# Patient Record
Sex: Female | Born: 1940
Health system: Southern US, Community
[De-identification: ages and names within clinical notes are randomized; demographics above are authoritative.]

## PROBLEM LIST (undated history)

## (undated) DIAGNOSIS — Z923 Personal history of irradiation: Secondary | ICD-10-CM

## (undated) DIAGNOSIS — E119 Type 2 diabetes mellitus without complications: Secondary | ICD-10-CM

## (undated) DIAGNOSIS — K219 Gastro-esophageal reflux disease without esophagitis: Secondary | ICD-10-CM

## (undated) DIAGNOSIS — E0590 Thyrotoxicosis, unspecified without thyrotoxic crisis or storm: Secondary | ICD-10-CM

## (undated) DIAGNOSIS — F419 Anxiety disorder, unspecified: Secondary | ICD-10-CM

## (undated) DIAGNOSIS — F32A Depression, unspecified: Secondary | ICD-10-CM

## (undated) DIAGNOSIS — E032 Hypothyroidism due to medicaments and other exogenous substances: Secondary | ICD-10-CM

## (undated) DIAGNOSIS — F329 Major depressive disorder, single episode, unspecified: Secondary | ICD-10-CM

## (undated) DIAGNOSIS — K5792 Diverticulitis of intestine, part unspecified, without perforation or abscess without bleeding: Secondary | ICD-10-CM

## (undated) DIAGNOSIS — K5790 Diverticulosis of intestine, part unspecified, without perforation or abscess without bleeding: Secondary | ICD-10-CM

## (undated) DIAGNOSIS — I10 Essential (primary) hypertension: Secondary | ICD-10-CM

## (undated) DIAGNOSIS — M1990 Unspecified osteoarthritis, unspecified site: Secondary | ICD-10-CM

## (undated) DIAGNOSIS — C801 Malignant (primary) neoplasm, unspecified: Secondary | ICD-10-CM

## (undated) HISTORY — PX: BREAST EXCISIONAL BIOPSY: SUR124

## (undated) HISTORY — DX: Major depressive disorder, single episode, unspecified: F32.9

## (undated) HISTORY — DX: Type 2 diabetes mellitus without complications: E11.9

## (undated) HISTORY — PX: BLADDER SURGERY: SHX569

## (undated) HISTORY — PX: ROTATOR CUFF REPAIR: SHX139

## (undated) HISTORY — DX: Unspecified osteoarthritis, unspecified site: M19.90

## (undated) HISTORY — DX: Essential (primary) hypertension: I10

## (undated) HISTORY — DX: Thyrotoxicosis, unspecified without thyrotoxic crisis or storm: E05.90

## (undated) HISTORY — DX: Depression, unspecified: F32.A

## (undated) HISTORY — DX: Diverticulitis of intestine, part unspecified, without perforation or abscess without bleeding: K57.92

---

## 1975-01-27 HISTORY — PX: ABDOMINAL HYSTERECTOMY: SHX81

## 1998-08-22 ENCOUNTER — Ambulatory Visit: Admission: RE | Admit: 1998-08-22 | Discharge: 1998-08-22 | Payer: Self-pay | Admitting: Urology

## 1998-08-26 ENCOUNTER — Ambulatory Visit (HOSPITAL_COMMUNITY): Admission: RE | Admit: 1998-08-26 | Discharge: 1998-08-26 | Payer: Self-pay | Admitting: Urology

## 1998-11-22 ENCOUNTER — Ambulatory Visit (HOSPITAL_COMMUNITY): Admission: RE | Admit: 1998-11-22 | Discharge: 1998-11-22 | Payer: Self-pay | Admitting: *Deleted

## 1999-03-06 ENCOUNTER — Encounter: Payer: Self-pay | Admitting: General Surgery

## 1999-03-06 ENCOUNTER — Ambulatory Visit (HOSPITAL_COMMUNITY): Admission: RE | Admit: 1999-03-06 | Discharge: 1999-03-06 | Payer: Self-pay | Admitting: General Surgery

## 1999-09-11 ENCOUNTER — Ambulatory Visit (HOSPITAL_COMMUNITY): Admission: RE | Admit: 1999-09-11 | Discharge: 1999-09-11 | Payer: Self-pay | Admitting: Urology

## 1999-09-11 ENCOUNTER — Encounter: Payer: Self-pay | Admitting: Urology

## 1999-09-12 ENCOUNTER — Ambulatory Visit (HOSPITAL_COMMUNITY): Admission: RE | Admit: 1999-09-12 | Discharge: 1999-09-12 | Payer: Self-pay | Admitting: Urology

## 2000-03-24 ENCOUNTER — Ambulatory Visit (HOSPITAL_COMMUNITY): Admission: RE | Admit: 2000-03-24 | Discharge: 2000-03-24 | Payer: Self-pay | Admitting: General Surgery

## 2000-03-24 ENCOUNTER — Encounter: Payer: Self-pay | Admitting: General Surgery

## 2002-05-22 ENCOUNTER — Encounter (HOSPITAL_COMMUNITY): Admission: RE | Admit: 2002-05-22 | Discharge: 2002-08-20 | Payer: Self-pay | Admitting: Internal Medicine

## 2002-05-23 ENCOUNTER — Encounter: Payer: Self-pay | Admitting: Internal Medicine

## 2002-06-06 ENCOUNTER — Ambulatory Visit (HOSPITAL_COMMUNITY): Admission: RE | Admit: 2002-06-06 | Discharge: 2002-06-06 | Payer: Self-pay | Admitting: General Surgery

## 2002-06-06 ENCOUNTER — Encounter: Payer: Self-pay | Admitting: General Surgery

## 2002-10-16 ENCOUNTER — Ambulatory Visit (HOSPITAL_BASED_OUTPATIENT_CLINIC_OR_DEPARTMENT_OTHER): Admission: RE | Admit: 2002-10-16 | Discharge: 2002-10-16 | Payer: Self-pay | Admitting: Plastic Surgery

## 2002-10-16 ENCOUNTER — Encounter (INDEPENDENT_AMBULATORY_CARE_PROVIDER_SITE_OTHER): Payer: Self-pay | Admitting: Specialist

## 2002-12-09 ENCOUNTER — Ambulatory Visit (HOSPITAL_COMMUNITY): Admission: RE | Admit: 2002-12-09 | Discharge: 2002-12-09 | Payer: Self-pay | Admitting: Orthopedic Surgery

## 2002-12-28 ENCOUNTER — Ambulatory Visit (HOSPITAL_COMMUNITY): Admission: RE | Admit: 2002-12-28 | Discharge: 2002-12-28 | Payer: Self-pay | Admitting: Orthopedic Surgery

## 2002-12-28 ENCOUNTER — Ambulatory Visit (HOSPITAL_BASED_OUTPATIENT_CLINIC_OR_DEPARTMENT_OTHER): Admission: RE | Admit: 2002-12-28 | Discharge: 2002-12-28 | Payer: Self-pay | Admitting: Orthopedic Surgery

## 2003-06-07 ENCOUNTER — Ambulatory Visit (HOSPITAL_COMMUNITY): Admission: RE | Admit: 2003-06-07 | Discharge: 2003-06-07 | Payer: Self-pay | Admitting: General Surgery

## 2004-06-12 ENCOUNTER — Ambulatory Visit (HOSPITAL_COMMUNITY): Admission: RE | Admit: 2004-06-12 | Discharge: 2004-06-12 | Payer: Self-pay | Admitting: General Surgery

## 2004-06-17 ENCOUNTER — Ambulatory Visit (HOSPITAL_COMMUNITY): Admission: RE | Admit: 2004-06-17 | Discharge: 2004-06-17 | Payer: Self-pay | Admitting: General Surgery

## 2004-12-12 ENCOUNTER — Ambulatory Visit (HOSPITAL_COMMUNITY): Admission: RE | Admit: 2004-12-12 | Discharge: 2004-12-12 | Payer: Self-pay | Admitting: Obstetrics and Gynecology

## 2004-12-31 ENCOUNTER — Ambulatory Visit (HOSPITAL_COMMUNITY): Admission: RE | Admit: 2004-12-31 | Discharge: 2004-12-31 | Payer: Self-pay | Admitting: Orthopaedic Surgery

## 2005-01-08 ENCOUNTER — Encounter: Admission: RE | Admit: 2005-01-08 | Discharge: 2005-01-08 | Payer: Self-pay | Admitting: Orthopaedic Surgery

## 2005-05-12 ENCOUNTER — Encounter (HOSPITAL_COMMUNITY): Admission: RE | Admit: 2005-05-12 | Discharge: 2005-08-10 | Payer: Self-pay | Admitting: Internal Medicine

## 2005-06-24 ENCOUNTER — Encounter: Admission: RE | Admit: 2005-06-24 | Discharge: 2005-06-24 | Payer: Self-pay | Admitting: Surgery

## 2005-06-24 ENCOUNTER — Encounter (INDEPENDENT_AMBULATORY_CARE_PROVIDER_SITE_OTHER): Payer: Self-pay | Admitting: *Deleted

## 2005-06-24 ENCOUNTER — Other Ambulatory Visit: Admission: RE | Admit: 2005-06-24 | Discharge: 2005-06-24 | Payer: Self-pay | Admitting: Interventional Radiology

## 2005-08-17 ENCOUNTER — Ambulatory Visit (HOSPITAL_COMMUNITY): Admission: RE | Admit: 2005-08-17 | Discharge: 2005-08-17 | Payer: Self-pay | Admitting: Orthopaedic Surgery

## 2005-11-30 ENCOUNTER — Ambulatory Visit (HOSPITAL_COMMUNITY): Admission: RE | Admit: 2005-11-30 | Discharge: 2005-11-30 | Payer: Self-pay | Admitting: General Surgery

## 2005-12-25 ENCOUNTER — Other Ambulatory Visit: Admission: RE | Admit: 2005-12-25 | Discharge: 2005-12-25 | Payer: Self-pay | Admitting: Obstetrics and Gynecology

## 2005-12-29 ENCOUNTER — Ambulatory Visit (HOSPITAL_COMMUNITY): Admission: RE | Admit: 2005-12-29 | Discharge: 2005-12-29 | Payer: Self-pay | Admitting: Internal Medicine

## 2006-01-28 ENCOUNTER — Encounter: Admission: RE | Admit: 2006-01-28 | Discharge: 2006-01-28 | Payer: Self-pay | Admitting: Orthopaedic Surgery

## 2006-02-14 ENCOUNTER — Emergency Department (HOSPITAL_COMMUNITY): Admission: EM | Admit: 2006-02-14 | Discharge: 2006-02-14 | Payer: Self-pay | Admitting: Emergency Medicine

## 2006-02-22 ENCOUNTER — Ambulatory Visit (HOSPITAL_COMMUNITY): Admission: RE | Admit: 2006-02-22 | Discharge: 2006-02-22 | Payer: Self-pay | Admitting: Urology

## 2006-07-12 ENCOUNTER — Ambulatory Visit (HOSPITAL_COMMUNITY): Admission: RE | Admit: 2006-07-12 | Discharge: 2006-07-12 | Payer: Self-pay | Admitting: Orthopaedic Surgery

## 2006-07-29 ENCOUNTER — Encounter: Admission: RE | Admit: 2006-07-29 | Discharge: 2006-07-29 | Payer: Self-pay | Admitting: Orthopaedic Surgery

## 2006-10-05 ENCOUNTER — Ambulatory Visit (HOSPITAL_COMMUNITY): Admission: RE | Admit: 2006-10-05 | Discharge: 2006-10-05 | Payer: Self-pay | Admitting: Obstetrics and Gynecology

## 2006-11-29 ENCOUNTER — Observation Stay (HOSPITAL_COMMUNITY): Admission: EM | Admit: 2006-11-29 | Discharge: 2006-11-30 | Payer: Self-pay | Admitting: Emergency Medicine

## 2007-01-21 ENCOUNTER — Ambulatory Visit (HOSPITAL_COMMUNITY): Admission: RE | Admit: 2007-01-21 | Discharge: 2007-01-21 | Payer: Self-pay | Admitting: Pediatrics

## 2007-01-27 HISTORY — PX: JOINT REPLACEMENT: SHX530

## 2007-03-25 ENCOUNTER — Ambulatory Visit (HOSPITAL_BASED_OUTPATIENT_CLINIC_OR_DEPARTMENT_OTHER): Admission: RE | Admit: 2007-03-25 | Discharge: 2007-03-25 | Payer: Self-pay | Admitting: Orthopaedic Surgery

## 2007-04-03 ENCOUNTER — Ambulatory Visit: Payer: Self-pay | Admitting: Internal Medicine

## 2007-04-03 ENCOUNTER — Inpatient Hospital Stay (HOSPITAL_COMMUNITY): Admission: EM | Admit: 2007-04-03 | Discharge: 2007-04-05 | Payer: Self-pay | Admitting: Emergency Medicine

## 2007-04-12 ENCOUNTER — Inpatient Hospital Stay (HOSPITAL_COMMUNITY): Admission: EM | Admit: 2007-04-12 | Discharge: 2007-04-15 | Payer: Self-pay | Admitting: Emergency Medicine

## 2007-04-14 ENCOUNTER — Encounter (INDEPENDENT_AMBULATORY_CARE_PROVIDER_SITE_OTHER): Payer: Self-pay | Admitting: Gastroenterology

## 2007-05-02 ENCOUNTER — Ambulatory Visit (HOSPITAL_COMMUNITY): Admission: RE | Admit: 2007-05-02 | Discharge: 2007-05-02 | Payer: Self-pay | Admitting: Gastroenterology

## 2007-10-21 ENCOUNTER — Inpatient Hospital Stay (HOSPITAL_COMMUNITY): Admission: RE | Admit: 2007-10-21 | Discharge: 2007-10-25 | Payer: Self-pay | Admitting: Orthopaedic Surgery

## 2007-11-09 ENCOUNTER — Encounter: Admission: RE | Admit: 2007-11-09 | Discharge: 2008-01-16 | Payer: Self-pay | Admitting: Orthopaedic Surgery

## 2008-03-16 ENCOUNTER — Emergency Department (HOSPITAL_COMMUNITY): Admission: EM | Admit: 2008-03-16 | Discharge: 2008-03-16 | Payer: Self-pay | Admitting: Emergency Medicine

## 2008-05-24 ENCOUNTER — Ambulatory Visit (HOSPITAL_COMMUNITY): Admission: RE | Admit: 2008-05-24 | Discharge: 2008-05-24 | Payer: Self-pay | Admitting: Obstetrics and Gynecology

## 2009-11-04 ENCOUNTER — Ambulatory Visit (HOSPITAL_COMMUNITY): Admission: RE | Admit: 2009-11-04 | Discharge: 2009-11-04 | Payer: Self-pay | Admitting: Orthopaedic Surgery

## 2010-02-04 ENCOUNTER — Encounter
Admission: RE | Admit: 2010-02-04 | Discharge: 2010-02-04 | Payer: Self-pay | Source: Home / Self Care | Attending: General Surgery | Admitting: General Surgery

## 2010-02-16 ENCOUNTER — Encounter: Payer: Self-pay | Admitting: Orthopaedic Surgery

## 2010-06-10 NOTE — Consult Note (Signed)
[name], [name]               ACCOUNT NO.:  [id number]   MEDICAL RECORD NO.:  [id number]          PATIENT TYPE:  INP   LOCATION:  [place]                         FACILITY:  [facility]   PHYSICIAN:  Nelwyn Salisbury, M.D.  DATE OF BIRTH:  1940-10-22   DATE OF CONSULTATION:  04/13/2007  DATE OF DISCHARGE:                                 CONSULTATION   REASON FOR CONSULTATION:  Diarrhea with rectal bleeding x 2 weeks.   ASSESSMENT:  1. Diarrhea with bloody stools 2 weeks ago and left-sided colitis      noted on a CT. Rule out ischemic colitis versus infectious colitis.  2. History of constipation and predominant irritable bowel syndrome.  3. History of hypothyroidism and thyroid nodules followed by Dr.      Cristine Polio in [facility].  4. Hypertension on Lotensin and Norvasc.  5. History of depression on Lexapro.  6. History of migraine headaches on oxycodone.  7. History of nephrolithiasis.  8. Status post partial hysterectomy with bladder tack and      abdominoplasty several years ago.  9. History of left rotator cuff surgery.  10.History of right knee arthroscopy on March 25, 2007 followed by      treatment with Keflex after surgery.  11.History of degenerative joint disease.   RECOMMENDATIONS:  1. Colonoscopy is planned for tomorrow morning.  2. The patient has been advised to avoid all nonsteroidals.  3. Avoid all artificial sweeteners like Splenda, Equal and diet      drinks.  4. Probiotics to be to added to treatment regimen.  5. Check TSH levels.   DISCUSSION:  [name] is a very pleasant 70 year old white  female who had arthroscopic knee surgery on the right side done on  March 25, 2007 by Dr. Zollie Beckers. She was prescribed Keflex after  surgery and was doing well until April 02, 2007 when she developed  abdominal cramping and diarrhea.  Subsequently she developed rectal  bleeding and was hospitalized on April 03, 2007 at [facility]  where  she was admitted by the teaching service.  She was found to have  leukocytosis with abdominal pain and a CT scan was done which showed  left-sided colitis.  The patient was treated empirically with Cipro and  Flagyl and her symptoms improved, the white count normalized and her  rectal bleeding resolved. Diarrhea improved and the patient was  discharged on April 05, 2007. Her symptoms returned shortly after  discharge. She went to her PCP, Dr. Reita Cliche, who recommended  that she will be rehospitalized. The patient was subsequently  hospitalized to [facility] on April 12, 2007 for similar  problems and GI consultation has been requested.  The patient was seen  by Dr. Amedeo Plenty at [facility] but the patient has chosen to switch  gastroenterologist for reasons not clear to me. She however denies any  bloody stools at this time.  She has 6-7 and sometimes 8 loose bowel  movements per day.  These seem to occur every time she eats. She has  diffuse abdominal pain  with no nausea or vomiting.  Appetite has been  fairly good. Her weight has been stable.  There is no history of melena.  She has a family history of colon cancer in the maternal grandmother.  She denies a family history of gluten allergy or IBD.   PAST MEDICAL HISTORY:  See list above.   ALLERGIES:  MORPHINE.   MEDICATIONS AT HOME:  1. Lexapro 40 mg daily.  2. Estratest 1 mg daily.  3. Lotrel 5/20 mg daily.  4. Ambien 10 mg at bedtime.  5. Percocet 5/325 one to two tablets every 4 hours p.r.n. pain.   [place]ote:  Norvasc, Lotensin, Lexapro,  methyltestosterone with estrogen and Dilaudid p.r.n., oxycodone p.r.n.,  Compazine, Ambien.   SOCIAL HISTORY:  She lives in [place] with her husband. She has three  grown children and six grandchildren.  She works as a Passenger transport manager at  [employer]. Her daughter is a Midwife at [employer]. She  denies the use of alcohol, tobacco or  drugs.   FAMILY HISTORY:  Her maternal grandmother had colon cancer but died of  complications of dementia. Her father has had heart disease.  Her  parents are otherwise alive and well.  There is no family history IBD or  gluten allergies.   PHYSICAL EXAMINATION:  GENERAL:  Reveals a very pleasant and cooperative  older white female in no acute distress lying comfortably in bed.  The patient has a temperature of 98.7, blood pressure of 113/63, pulse  94 per minute, respiratory rate 20.  HEENT:  Examination reveals atraumatic, normocephalic head.  The patient  had healthy oropharyngeal mucosa without exudate.  NECK:  Supple.  CHEST:  Clear to auscultation. S1, S2 regular. No murmur, rub or gallop,  rales, rhonchi or wheezing.  ABDOMEN:  Soft, slightly obese with diffuse tenderness on palpation with  normal abdominal bowel sounds.  No hepatosplenomegaly appreciated.  RECTAL:  Deferred.  EXTREMITIES:  Warm without edema, cyanosis or clubbing.  There is no  neurological deficits.   LABORATORY DATA:  A normal CBC done today. PT was normal at 15.1 with a  PTT of 32 and INR of 1.2. BMET revealed a sodium level of 140, potassium  4.3, chloride 109, CO2 28, glucose was 113, BUN 4, creatinine 0.8,  calcium 8.1, lipase was normal at 53 done yesterday.  The patient had a  stool for Cryptosporidium and Giardia that was negative on April 05, 2007. C diff toxin assay has been negative on March 9 done twice and  again on March 10. LFTs were normal on the CMET done on April 12, 2007.  She had a CT scan of the abdomen and pelvis  on April 03, 2007 that revealed left-sided colitis and the differential  diagnosis was pseudomembranous colitis versus ischemic colitis.  She had  bilateral nephrolithiasis.   PLAN:  As above. Other recommendation will be made once the colonoscopy  has been done.      Nelwyn Salisbury, M.D.  Electronically Signed     JNM/MEDQ  D:  04/13/2007  T:  04/14/2007  Job:   [id number]   cc:   Cristine Polio, MD  [facility], [place]  Fax: [phone]

## 2010-06-10 NOTE — Discharge Summary (Signed)
[name], [name]               ACCOUNT NO.:  [id number]   MEDICAL RECORD NO.:  [id number]          PATIENT TYPE:  INP   LOCATION:  6743                         FACILITY:  [facility]   PHYSICIAN:  Thomes Lolling, M.D.    DATE OF BIRTH:  [birth year]   DATE OF ADMISSION:  04/03/2007  DATE OF DISCHARGE:  04/05/2007                               DISCHARGE SUMMARY   DISCHARGE DIAGNOSES:  1. Colitis with hematochezia.  2. Hypertension.  3. Depression.  4. Status post right knee arthroscopy.   MEDICATIONS AT DISCHARGE:  1. Citalopram 40 mg p.o. daily.  2. Estratest 1 tablet p.o. daily.  3. Lotrel 5/20 mg p.o. daily.  4. Percocet 5/325 mg 1 to 2 tablets p.o. every 4 hours p.r.n.  5. Ambien 10 mg p.o. nightly p.r.n.  6. Cipro 500 mg p.o. b.i.d.  7. Flagyl 500 mg p.o. t.i.d.   DISPOSITION AND FOLLOWUP:  The patient is to follow up with primary care  at [facility], Dr. Reita Cliche.  During the visit, the patient is  to be assessed for abdominal pain/left lower quadrant tenderness.  Also,  inquiry as to stools and hematochezia.  A CBC can be done in order to  assess hemoglobin.  The patient's hemoglobin on admission was 14.9, and  hemoglobin on discharge was 13.9.  The patient is also to follow up with  Dr. Alonza Bogus, gastroenterologist.  The patient states she had a  colonoscopy greater than 10 years ago, therefore a repeat colonoscopy  for screening purposes, and in this case, given the colitis noted on CT  and the hematochezia, a colonoscopy is warranted to rule out ischemic  bowel versus other etiologies.   PROCEDURES PERFORMED:  CT of the abdomen done on April 03, 2007.   IMPRESSION:  Colitis of the left colon from splenic flexure down to  sigmoid colon.   DEFERENTIAL DIAGNOSES:  Includes ischemic colitis, C. diff, and  infectious colitis.   CONSULTATION:  Dr. Amedeo Plenty from Gastroenterology was consulted.   BRIEF ADMITTING HISTORY AND PHYSICAL:  The patient is a 70 year old  woman with past medical history of hypertension and nephrolithiasis that  comes in with left lower quadrant pain and bright red blood per rectum  that began at 3:00 p.m. the day prior to admission.  She reports that  she went to the bathroom and began having cramping left upper quadrant  abdominal pain and blood in stools.  Of note, the stools were formed and  brown.  As she continued to have bowel movements, they became less  formed, more watery, and increased blood with blood eventually turning  cranberry in color.  The patient states she has had greater than 10  bowel movements since symptoms began the afternoon prior to admission.  The patient states she is currently only passing small amounts of blood  with no stools whatsoever.  This was confirmed with her last bowel  movement in the emergency department.  The patient states that her pain  that was initially in the left upper quadrant has now became left lower  quadrant in nature, it has been  constant since it started.  No  palliating or provoking factors noted.  The pain is 3/10 in intensity  now, it was 8/10 at its worst.  The pain does not radiate and the  patient has not had this pain before.  The pain was associated with  nausea and chills.  The patient states she recently finished a Z-Pak for  some nasal congestion on March 29, 2007, and is also on a  methylprednisolone taper status post her right knee arthroscopy on  March 25, 2007.  The patient is taking aspirin 81 mg p.o. daily.  As  mentioned above, she has not had a colonoscopy in greater than 10 years  ago, the last one was presumed normal.  The patient denies any  dizziness, loss of consciousness, vomiting, fever, weight loss, recent  travel, changes in diet, or anticoagulants.  The patient also denies any  claudication, exertional chest pain, or any abdominal pain after eating.   PHYSICAL EXAMINATION:  VITAL SIGNS:  On admission, temperature 98.9,  blood pressure  167/75, pulse 68, respirations 24, and O2 sat 98% on room  air.  GENERAL:  The patient was uncomfortable, but in no acute distress.  HEENT:  Eyes were anicteric.  Pupils equally round and reactive to  light.  Extraocular movements intact.  ENT with pink moist mucous  membranes, and oropharynx is clear.  NECK:  Supple.  RESPIRATION:  Clear to auscultation bilaterally with good air movement.  CARDIOVASCULAR:  She had a regular rate and rhythm.  No murmurs,  gallops, or rubs with positive S1 and S2.  GI:  Hyperactive bowel sounds noted, soft with tenderness to palpation  in the left upper quadrant, but she had tenderness to palpation in the  left lower quadrant greater than left upper quadrant, and initially  questionable rebound that was not reproducible afterwards, though quite  tender and nondistended.  EXTREMITIES:  Without any edema and good peripheral pulses.  GU:  No CVA tenderness.  RECTAL:  Positive for external hemorrhoids.  Anal tone was within normal  limits.  Positive for internal hemorrhoids.  No stool in the rectum, but  small amount of bright red blood that was obviously hemoccult positive.  MUSCULOSKELETAL:  Status post right knee arthroscopy.  No erythema,  swelling, or tenderness noted.  NEUROLOGIC:  The patient was alert and oriented x3.  Cranial nerves II  through XII grossly intact, motor intact, sensory intact, and gait was  normal.  PSYCHIATRY:  The patient's affect was appropriate.   LABORATORY:  Sodium 137, potassium 3.6, chloride 103, bicarb 24, BUN 14,  creatinine 0.87, glucose 127, and bilirubin 1.0.  Alk phos 57, AST 17,  and ALT 21.  Protein 6.6, albumin 3.8, and calcium 9.2.  WBCs 20.9, ANC  16.9, hemoglobin 14.9, MCV 19.4, platelets 322, lipase 32, PTT 26, PT  13.0, point-of-care markers negative x1, and lactic acid 2.0.   HOSPITAL COURSE BY PROBLEM:  1. Colitis with hematochezia.  Given the patient's leukocytosis with a      left shift, left lower  quadrant pain, and colitis per CT scan,      concern for infectious colitis.  Therefore, the patient was started      on IV ciprofloxacin and Flagyl.  The patient was also put on 1 mg      Dilaudid given reported migraine headaches from morphine.  Given      the patient also had a recent course of antibiotics as an  outpatient as well as some intraoperative antibiotics, concern for      C. diff was also present, and the patient was given Flagyl per      above as well as C. diff toxin assays.  The patient had no stools      formed initially, but once the stool began forming, a C. diff was      sent out along with stool culture and had 2 negative C. diffs.  The      other concern was for bowel ischemia, the patient does not report      any other signs of ischemia anywhere including coronary artery      disease or angina, exertional chest pain, claudication, or      abdominal pain after eating.  However, the patient was put on a      clear liquid diet, advanced to full liquids, and told to continue      for a few more days as an outpatient and then advance as tolerated.      The patient's pain improved substantially while in the hospital and      tolerated her full liquid diet.  The patient is to follow up with      an outpatient colonoscopy with Dr. Alonza Bogus.  Other concern      which is thought to be less likely is ulcerative colitis given the      patient's age and bimodal distribution of ulcerative colitis in the      late teens to early 60s and then again in the late 64s or 26s.      Again, this is thought to be less likely, but the patient was      recommended outpatient colonoscopy.  The patient's white count      decreased substantially from 20.9 down to 16 and discharged with a      white count of 13.9.  The patient was afebrile throughout the      hospitalization, and as mentioned before, had a substantial      decrease in pain and was tolerating full liquids.  2.  Hypertension.  The patient's blood pressure was initially slightly      elevated, but was continued on her home dose of Lotrel.  3. Depression.  The patient denied any suicidal ideation and appeared      to be stable.  The patient's citalopram at home dose was continued.  4. Status post right knee arthroscopy.  Again, as mentioned in the      HPI, the patient did not have any signs of infection including      tenderness to palpation, erythema, or swelling.  The patient was      continued on her methylprednisolone taper which was completed on      the day of discharge.  The patient was put on Dilaudid per #1,      which also controlled the knee pain.   LABORATORY ON DISCHARGE:  WBCs of 13.9, initially 20.9; hemoglobin 13.9  initially, 14.9 on admission.  C. diff negative x2, stool culture is  pending above.  The  patient is being treated.      Alphia Moh, MD  Electronically Signed      Thomes Lolling, M.D.  Electronically Signed    MA/MEDQ  D:  04/05/2007  T:  04/06/2007  Job:  [id number]   cc:   Hallam Amedeo Plenty, M.D.  Wynetta Emery Hurrelbrink

## 2010-06-10 NOTE — Op Note (Signed)
NAMECARLINE, [name]               ACCOUNT NO.:  [id number]   MEDICAL RECORD NO.:  [id number]          PATIENT TYPE:  AMB   LOCATION:  [place]                          FACILITY:  [facility]   PHYSICIAN:  Lind Guest. Ninfa Linden, M.D.DATE OF BIRTH:  03-24-1940   DATE OF PROCEDURE:  03/25/2007  DATE OF DISCHARGE:                               OPERATIVE REPORT   PREOPERATIVE DIAGNOSIS:  1. Severe degenerative arthritis medial compartment, right knee.  2. Chronic meniscal tearing with mechanical symptoms, right knee.   POSTOPERATIVE DIAGNOSIS:  1. 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  2. Complex medial meniscal tear, right knee.   PROCEDURE:  Right knee arthroscopy with debridement including  chondroplasty of the medial compartment and a partial medial  meniscectomy.   SURGEON:  Lind Guest. Ninfa Linden, M.D.   ANESTHESIA:  1. Local knee block, right knee.  2. IV sedation with mask ventilation.   ESTIMATED BLOOD LOSS:  Minimal.   COMPLICATIONS:  None.   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  She is well  known to me.  I have seen her for several months now with worsening  right knee pain.  She has had an effusion above the knee.  I have tried  anti-inflammatory medications as well as injections of the knee and even  an MRI which showed severe medial compartment degenerative joint  disease.  She was maintaining herself well with the pain, but then she  started to develop locking and catching in her knee.  I talked to her  about the possibility of knee replacement surgery, but she wished to see  if there was anything she could do just to stop the mechanical symptoms.  The risks and benefits of the surgery were explained to her in length  and she agreed to proceed with surgery.   PROCEDURE DESCRIPTION:  After informed consent was obtained and the  appropriate right knee was marked, anesthesia was obtained with a knee  block, she was then brought to the operating room and placed supine on  the operating table.  Mask ventilation and IV sedation was obtained.  A  non-sterile tourniquet was placed around her upper right thigh but was  never utilized during the case.  Her knee was prepped and draped with  DuraPrep and sterile drapes including a sterile stockinette.  With the  bed raised and the lateral leg post utilized, the knee was flexed off  the side of the table, a time out was called, and she was identified as  the correct patient and correct right knee.   I then made an anterolateral portal just lateral to the patellar tendon  and an arthroscopic cannula was inserted.  There was a large effusion  that was drained from the knee.  I then put the camera in the knee and  went directly to the medial compartment.  You could see that there was  significant grade 4 chondromalacia of the medial femoral condyle and  medial plateau and there was complex tearing of the central portion of  the meniscus all the way back to the posterior horn.  An anteromedial  portal was then made an arthroscopic shaver was inserted.  I performed a  partial medial meniscectomy using the shaver and upcoming biters.  I  then used the shaver to debride loose areas of cartilage along the  medial femoral condyle and the medial plateau as well as the trochlea  where there was significant chondromalacia, as well.  The lateral  compartment was assessed and found to be intact and pristine.  The ACL  was, likewise, intact.  There was degenerative changes underneath the  patella and the arthroscopic shaver was used to debride the trochlea and  then the patella.  I then drained the effusion from the knee and removed  all instrumentation.  The portal sites were closed with interrupted 4-0  nylon sutures.  Xeroform followed by a well padded sterile dressing was  applied.   [name] was then taken to the recovery room in stable condition.   Postoperatively, she will increase her activities as she tolerates.  All  final counts were correct and there were no complications noted.      Lind Guest. Ninfa Linden, M.D.  Electronically Signed     CYB/MEDQ  D:  03/25/2007  T:  03/26/2007  Job:  [id number]

## 2010-06-10 NOTE — Op Note (Signed)
[name], Hernandez               ACCOUNT NO.:  [id number]   MEDICAL RECORD NO.:  [id number]          PATIENT TYPE:  INP   LOCATION:  [place]                         FACILITY:  [facility]   PHYSICIAN:  Lind Guest. Ninfa Linden, M.D.DATE OF BIRTH:  11-03-1940   DATE OF PROCEDURE:  10/21/2007  DATE OF DISCHARGE:                               OPERATIVE REPORT   PREOPERATIVE DIAGNOSES:  Severe degenerative joint disease and  osteoarthritis of the right knee.   POSTOPERATIVE DIAGNOSES:  Severe degenerative joint disease and  osteoarthritis of the right knee.   PROCEDURE:  Right total knee arthroplasty using computer-assisted  navigation.   IMPLANTS:  DePuy rotating platform knee with size 2 femur, size 2 tibial  tray, 10-mm polyethylene insert, 32-mm polyethylene button.   SURGEON:  Lind Guest. Ninfa Linden, M.D.   ASSISTANT:  Epimenio Foot, P.A.   ANESTHESIA:  1. Right leg femoral block.  2. General.   TOURNIQUET TIME:  1 hour and 30 minutes.   BLOOD LOSS:  300 mL.   ANTIBIOTICS:  Ancef 1 gram IV.   COMPLICATIONS:  None.   INDICATIONS:  Briefly, [name] is a 70 year old female who is also an OR  attendant and a scrub tech at [employer].  She has had debilitating pain  with her right knee, and I have seen her for some time for this.  I have  even performed arthroscopic surgery on her knee to temporize this, due  to the fact that she wanted to miss as little work as possible.  At the  time of arthroscopy I found that she had severe cartilage loss  throughout her knee.  We have still tried to temporize her symptoms with  injections, and it has gotten to the point it was greatly affecting her  activities of daily living.  A recommendation for total knee replacement  was made and she agrees to proceed with surgery.  The risks and benefits  of surgery included the risk of blood loss as well the risk of a DVT and  PE.  She agreed to proceed with surgery after these risks were  understood.   PROCEDURE DESCRIPTION:  After informed consent was obtained, the  appropriate right leg was marked.  Anesthesia was obtained with a  femoral nerve block.  She was then brought to the operating room and  placed supine on the operating table.  General anesthesia was then  obtained.  A Foley catheter was placed.  A nonsterile tourniquet was  placed around her right operative thigh, and then the right leg was  prepped and draped with DuraPrep and sterile drapes including a sterile  stockinette.  A time-out was called and she was identified as the  correct patient and the correct right knee.  I then used the Esmarch to  wrap out the leg and the tourniquet was inflated to 325 mmHg.  A midline  incision was then made directly over the patella, and was carried  proximally and distally.  I dissected down to the joint.  I then made a  medial parapatellar arthrotomy.  There was a large amount of  synovial  fluid that was encountered.  Once I was able to open the knee joint, I  could easily invert the patella and flex the knee.  I cleaned the knee  of osteophytes throughout the femoral condyle and the tibial plateau.  We removed the remaining portions of the medial and lateral meniscus for  the ACL and PCL.  Next, I proceeded with the computer navigation portion  of the case.  Two Steinmann pins were made through 2 small stab  incisions in the tibia distally, and were placed in the anteromedial to  posterolateral direction.  Through the major incision, I placed 2 pins  also in the femur.  Navigation arrays were then placed on the femoral  pins and the tibial pins for navigation portion of the case.  I was able  to select out points around the knee and ankle, as well as assessing the  hip center of rotation for mapping out the knee using computer  navigation.  This helped [place] decide on what the appropriate cuts to make  for balancing the knee as much as possible.  This also allowed for  soft  tissue balancing.  With the knee flexed, we first made a tibial cut and  took about 10 mm off the high side.  This was verified under computer  navigation.  Once this was made, we made our distal femoral cut; and  with the spacer blocks, we verified that we had balance cuts in flexion  and extension.  At the distal femoral cut we were able to make the  anterior and posterior femoral cuts as well as the chamfer cuts.  The  computer helped [place] and with visualization we made our cuts based off a  size 2 femur.  A femoral block box cut was then also made.  Then we  sized the size 2 tibial tray and made the appropriate cuts for the  tibial keel.  The size 2 tibial tray was followed by a size 2 femur and  10-mm polyethylene insert were placed as the trial components in the  knee.  I then made a measurement off the patella and did a freehand cut  of the patella, and placed a size 32 patellar button.  We drilled the  button holes in the patella.  I put the knee through a range of motion  and verified that was fully balanced in flexion and extension, using  computer navigation as well as direct visualization -- and it was felt  to be a stable knee.   I then removed all trial components and we fully irrigated the knee with  pulsatile lavage solution.  We then used cement and cemented in the real  tibial tray -- which was a size 2 DePuy rotating platform knee/tibia.  We cemented the size 2 femoral component, placed the real 10-mm  polyethylene insert, and then cemented the 32-mm patellar button.  Once  the cement was allowed to dry, we put the knee through a range of motion  again; and it was felt to be stable ligamentously and well balanced.  We  then let tourniquet down and hemostasis was obtained.  We thoroughly  irrigated out the knee again with pulsatile lavage, and then  reapproximated the arthrotomy with #1 Vicryl suture in interrupted  format.  We then closed the deeper tissue with 0  Vicryl, followed by 2-0  Vicryl, followed by a running 4-0 Vicryl subcuticular stitch.  I did  place a medium Hemovac through  the end of the arthrotomy before closing  this.  Steri-Strips were then placed, followed by a well-padded sterile  dressing.  The Steinmann pins were removed and the distal Steinmann pin  sites were just closed with interrupted 3-0 nylon suture.  A well-padded  sterile dressing was applied.  The patient was awakened, extubated and  taken to recovery room in stable condition.  There were no complications  noted and all final counts were correct.      Lind Guest. Ninfa Linden, M.D.  Electronically Signed     CYB/MEDQ  D:  10/21/2007  T:  10/22/2007  Job:  [id number]

## 2010-06-10 NOTE — Discharge Summary (Signed)
[name], [name]               ACCOUNT NO.:  [id number]   MEDICAL RECORD NO.:  [id number]          PATIENT TYPE:  INP   LOCATION:  [place]                         FACILITY:  [facility]   PHYSICIAN:  Lind Guest. Ninfa Linden, M.D.DATE OF BIRTH:  Oct 18, 1940   DATE OF ADMISSION:  10/21/2007  DATE OF DISCHARGE:  10/25/2007                               DISCHARGE SUMMARY   ADMITTING DIAGNOSIS:  Severe osteoarthritis and degenerative joint  disease right knee.   DISCHARGE DIAGNOSIS:  Severe degenerative disease and osteoarthritis  right knee.   PROCEDURE:  Right total knee arthroplasty on October 21, 2007.   DISCHARGE MEDICATIONS:  1. Oxycodone 5 mg 1-2 p.o. q. 4-6 hours p.r.n.  2. Robaxin 500 mg one p.o. q.6 h p.r.n.  3. Coumadin 5 mg p.o. daily at 6:00 p.m., adjust for target INR of 2.0-      3.0.  4. See checked off medications on medical reconciliation order.   DISPOSITION:  To home.   DISCHARGE INSTRUCTIONS:  While she is at home, [name] will work with  [place] with range of motion on her right knee as well as  a gait training and mobility.  She can get her incision wet in shower  and dry dressings can be changed as needed.  Follow-up appointment will  be established in the office in 2 weeks.  Home health therapy will also  perform biweekly lab draws for adjustment of her Coumadin.   HOSPITAL COURSE:  Briefly, [name] is a 70 year old female with  severe degenerative joint disease involving her right knee.  We have  tried to temporize this with the arthroscopic surgeries well as  injections and anti-inflammatories.  It had gotten to the point where  this was affecting her activities of daily living, and she was to  proceed with a total knee replacement.  We counseled her extensively  about this and she understood the need to proceed with total knee  surgery.   [name] was taken to the operating room.  On the day of admission,  she reports she underwent  successful right total knee arthroplasty  without complications.  For detailed description of the operation,  please refer to the dictated operative note in the patient's medical  record.  Postoperatively, she was admitted to the orthopedic floor and  began working with her knee on a CPM for continuous passive motion.  She  also began working with physical therapy and occupational therapy on  activities daily living as well as a train mobility.  By the day of  discharge, she was  tolerating her oral diet as well as oral pain medications.  Her knee  incision was clean, dry and intact.  Her INR was being adjusted  accordingly.  She was doing quite well.  It was felt she could be  discharged safely to home with close home health PT.      Lind Guest. Ninfa Linden, M.D.  Electronically Signed     CYB/MEDQ  D:  10/25/2007  T:  10/25/2007  Job:  [id number]

## 2010-06-10 NOTE — Consult Note (Signed)
NAMESOMA, GENTLE NO.:  [id number]   MEDICAL RECORD NO.:  [id number]          PATIENT TYPE:  OBV   LOCATION:  [id number]                         FACILITY:  [facility]   PHYSICIAN:  Belva Crome, M.D.   DATE OF BIRTH:  11-20-1940   DATE OF CONSULTATION:  11/30/2006  DATE OF DISCHARGE:  11/30/2006                                 CONSULTATION   CONCLUSIONS:  1. Prolonged chest discomfort occurring at rest and of tight quality,      etiology uncertain; myocardial infarction has been excluded.  Rule      out coronary artery disease.  2. History of depression.  3. Hypertension.  4. History of migraine headaches.  5. Degenerative arthritis.  6. History of hypothyroidism and goiter.   RECOMMENDATIONS:  1. Agree with management to this point; myocardial infarction has been      excluded.  2. Pharmacologic stress perfusion study to rule out obstructive      coronary disease.  3. Risk factor modification as the patient has a moderately increased      Framingham risk score in the 10% range over the next 10 years.   COMMENT:  The patient is 54 and was admitted on November 29, 2006, with  chest discomfort.  She had a 30 to 40-minute episode of substernal  pressure that was low grade.  It was not aggravated by physical activity  and not precipitated by activity of motion.  The discomfort resolved  spontaneously.  She had had previous episodes of similar quality.  She  was admitted to the hospital, and also markers to this point have been  negative.   Risk factors for coronary disease include family history (father had  bypass surgery), hypertension, and postmenopausal state.   PHYSICAL EXAMINATION:  GENERAL:  The patient is in no acute distress.  VITAL SIGNS:  Blood pressure is 134/80 and heart rate is 84.  HEENT:  Unremarkable.  Pupils are equal and reactive to light.  NECK:  No JVD or carotid bruits.  LUNGS:  Clear to auscultation and percussion.  CARDIAC :  No gallop.   No rub.  No click.  ABDOMEN:  Soft.  Bowel sounds normal.  EXTREMITIES:  No edema.  Pulses 2+ and symmetric in upper and lower  extremities.   EKG reveals QS pattern in V1 and V2.  Normal sinus rhythm, otherwise  unremarkable.   DISCUSSION:  The patient will undergo a pharmacologic myocardial  perfusion study to rule out obstructive coronary disease.  Myocardial  infarction has been excluded.  The likelihood of significant coronary  disease is low-to-moderate.  Risk factor modification should be started.      Belva Crome, M.D.  Electronically Signed     HWS/MEDQ  D:  11/30/2006  T:  12/01/2006  Job:  [id number]   cc:   Reita Cliche

## 2010-06-10 NOTE — H&P (Signed)
[name], [name]               ACCOUNT NO.:  [id number]   MEDICAL RECORD NO.:  [id number]          PATIENT TYPE:  INP   LOCATION:  [place]                         FACILITY:  [facility]   PHYSICIAN:  Silvestre Moment, MDDATE OF BIRTH:  [birth year]   DATE OF ADMISSION:  04/12/2007  DATE OF DISCHARGE:                              HISTORY & PHYSICAL   CHIEF COMPLAINT:  Persistent diarrhea secondary to colitis.   HISTORY OF PRESENT ILLNESS:  [name] is a 70 year old woman who was  discharged from the [facility] on April 05, 2007 after  a 3-day admission for a workup of colitis.  According to the discharge  summary, she initially presented on April 03, 2007 with left lower  quadrant pain and bright red blood per rectum.  She had a CT scan that  showed left-sided colitis.  Given her white count at that time of  approximately 20, there is concern for an infectious component.  She was  started on IV Cipro and Flagyl.  Her pain was controlled with Dilaudid.  Clostridium difficile cultures and cultures for Giardia were eventually  negative.  There was also some concern for ischemic colitis.  She was  evaluated in consultation by Dr. Amedeo Plenty from gastroenterology.  By April 05, 2007, her diarrhea and resolved, her white count had normalized, and  she was planned for an outpatient colonoscopy with Dr. Alonza Bogus.  She  was also to complete a course of p.o. Flagyl and Cipro.  She completed  this course this morning.  Unfortunately, since her discharge  approximately 1 week ago, she has had persistence of her diarrhea.  She  describes the diarrhea as watery with small flecks of solid material.  These episodes occur every time she eats.  She had approximately five  today.  She denies blood in her stools at this point.  She continues to  have her crampy 5/10 abdominal pain.  It is slightly improved with  Percocet.  She has no fever.  Her weight has remained stable.  However,  she  does have decreased energy.  She denies dizziness or  lightheadedness, and she denies recent travel or sick contacts.   REVIEW OF SYSTEMS:  A comprehensive 10-point review of systems was  obtained and was negative except as per HPI.   PAST MEDICAL HISTORY:  1. Colitis, as above.  2. Hypertension.  3. Migraine headaches.  4. Degenerative arthritis, status post right knee arthroscopy,      March 25, 2007.  5. Hypothyroidism.  6. Depression.  7. Kidney stones.  8. Rotator cuff surgery.  9. Status post hysterectomy.  10.Possible IBS in the past, with diarrheal-predominant disease.   SOCIAL HISTORY:  She lives in [place] with her husband.  She works as  a Equities trader at [employer].  Her daughter is a Midwife here at  [employer].  She denies tobacco, alcohol, or illicit drug use.   FAMILY HISTORY:  Grandmother died of colon cancer.  Father has coronary  artery disease.  No family history of gastrointestinal disorders.   ALLERGIES:  1.  MORPHINE.  2. DEMEROL.   MEDICATIONS:  1. Lexapro 40 mg daily.  2. Estratest 1 mg daily.  3. Lotrel 5/20 mg daily.  4. Ambien 10 mg at night.  5. Percocet 5/325, 1-2 tablets q.4 h. if needed.   PHYSICAL EXAMINATION:  VITAL SIGNS:  Temperature 98.9, blood pressure  134/57, pulse 91, respiratory rate 18, pulse oximetry 97% on room air.  GENERAL:  Well-nourished well-developed woman, in no apparent discomfort  or acute distress.  HEENT:  Sclerae icteric.  Oropharynx clear.  NECK:  Supple.  HEART:  Regular, with no murmurs.  LUNGS:  Clear to auscultation bilaterally.  ABDOMEN:  Soft, nondistended.  Positive bowel sounds in all four  quadrants.  She has mild diffuse tenderness, with slight increase in the  left lower quadrant.  There is mild voluntary guarding.  No significant  rebound tenderness.  EXTREMITIES: Warm, with no edema.  NEUROLOGIC:  No focal deficits.   DATA REVIEW:  1. White count 9.8, hemoglobin 14.3, platelets  283.  2. Potassium 3.4, otherwise comprehensive metabolic panel was within      normal limits.  Lipase within normal limits.  Urinalysis is a dirty      specimen.   ASSESSMENT AND PLAN:  1. [name] is a 70 year old woman with a known history of left-      sided colitis from her recent admission at [facility].      Unfortunately, she has had persistence of her diarrheal symptoms.      This has caused her to be symptomatic with fatigue.  She has      recently completed a course of ciprofloxacin and Flagyl.  She has      no fever or leukocytosis to support an infectious etiology.  There      was some concern during the last admission for an ischemic cause.      Fortunately, her anion gap is normal at this time, and there is no      appearance of ongoing ischemia.  There is also a report of blood      with her previous diarrhea.  She has no bleeding at this time, and      her hemoglobin has actually increased from her discharge level.      Therefore, the etiology at this time is somewhat uncertain.  We      will admit her to the hospitalist service.  We will make her n.p.o.      after midnight.  We will consult gastroenterology.  There was some      mention in their previous consult note about an unprepped      sigmoidoscopy.  Otherwise, we told her we would hold off on any      prep for colonoscopy until they have make this decision.  We will      hydrate her with normal saline.  We will not utilize Imodium at      this point, but would consider if the diarrhea becomes severe      overnight.  We will use Compazine as needed for nausea.  We will      check labs in the morning to make sure they are stable.  We will      use Dilaudid as needed.  We have asked her to use this judiciously,      as we would not like to mask any increase in her pain.  2. Hypertension:  [name]' blood pressure is within normal limits  at this time.  We will continue her Lotrel.  3. Depression.  Ms.  Hernandez' mood appears to be stable at this time.      We will continue her Lexapro.  4. Hypokalemia.  [name]' potassium was slightly low at 3.4 on      admission.  We will give her 60 mEq of potassium now.  We will      recheck her values in the morning.  5. Deep venous thrombosis prophylaxis.  Given Ms. Atalla' reported      history of blood per rectum, we will not utilize pharmacologic DVT      prophylaxis.  We will have her use SCDs, as she will be immobile      and has other risk factor of being greater than 21 years of age.      Silvestre Moment, MD  Electronically Signed     WT/MEDQ  D:  04/12/2007  T:  04/13/2007  Job:  [id number]

## 2010-06-10 NOTE — Consult Note (Signed)
NAMEVERDELLA, STOEN               ACCOUNT NO.:  [id number]   MEDICAL RECORD NO.:  [id number]          PATIENT TYPE:  INP   LOCATION:  6743                         FACILITY:  [place]   PHYSICIAN:  John C. Amedeo Plenty, M.D.    DATE OF BIRTH:  February 03, 1940   DATE OF CONSULTATION:  04/03/2007  DATE OF DISCHARGE:                                 CONSULTATION   REASON FOR CONSULTATION:  Acute colitis presumed ischemic.   HISTORY OF PRESENT ILLNESS:  The patient is a 70 year old white female  who began having lower abdominal cramps, becoming more intense with  diarrhea which later became bloody during the night with pain settling  in the left lower quadrant.  She presented here and was found to white  blood cell count of 20,000, although she has been methylprednisolone in  light of some recent knee surgery.  She has abdominal CT scan which  showed left-sided colitis involving the splenic flexure descending and  sigmoid colon.  She had been on some antibiotics briefly and around the  time for arthroscopic knee surgery.  She denies any recent travel.  She  has no history of inflammatory bowel disease.   PAST MEDICAL HISTORY:  1. Hypertension.  2. Migraine headaches.  3. Degenerative arthritis.  4. Hypothyroidism.  5. Depression.  6. Kidney stones.   SURGERY:  None.   ALLERGIES:  MORPHINE, DEMEROL.   MEDICATIONS:  Citalopram, Solu-Medrol, Estratest, Lotrel, tramadol,  Ambien, aspirin 81 mg.   FAMILY HISTORY:  Grandmother died of colon cancer.  Father had coronary  artery disease.   PHYSICAL EXAMINATION:  GENERAL:  Well-developed, well-nourished white  female in no acute distress.  HEART:  Regular rate and rhythm without murmurs.  LUNGS:  Clear.  ABDOMEN:  Soft, nondistended with hypoactive bowel sounds.  No  hepatosplenomegaly, mass or guarding.  There is left-sided tenderness  with mild focal rebound particularly in the left lower quadrant.   IMPRESSION:  Probable ischemic colitis.   PLAN:  Antibiotics, bowel rest and will need colonoscopy at some point  if improves and diagnosis is secure.  Will probably allow complete  recovery.  If she does not improve, may do at least unprepped flexible  sigmoidoscopy during this hospitalization.           ______________________________  Elyse Jarvis Amedeo Plenty, M.D.     JCH/MEDQ  D:  04/03/2007  T:  04/04/2007  Job:  [id number]

## 2010-06-10 NOTE — Discharge Summary (Signed)
[name], [name]               ACCOUNT NO.:  [id number]   MEDICAL RECORD NO.:  [id number]          PATIENT TYPE:  INP   LOCATION:  [place]                         FACILITY:  [facility]   PHYSICIAN:  Sherryl Manges, M.D.  DATE OF BIRTH:  August 17, 1940   DATE OF ADMISSION:  04/12/2007  DATE OF DISCHARGE:  04/15/2007                               DISCHARGE SUMMARY   PRIMARY MEDICAL DOCTOR:  Althia Forts.   DISCHARGE DIAGNOSES:  1. Acute colitis, likely microscopic.  2. Hypertension.  3. History of depression.  4. Status post right knee arthroscopy March 25, 2007.  5. Bilateral asymptomatic nephrolithiasis.  6. History of migraines.   DISCHARGE MEDICATIONS:  1. Lexapro 40 mg p.o. daily.  2. Estratest 1 tablet p.o. daily.  3. Lotrel (5/20) one p.o. daily.  4. Ambien 10 mg p.o. p.r.n. daily at bedtime.  5. Percocet (5/325) one p.o. p.r.n. q.4 h.  6. Align (4 mg) or Florastor (250 mg) one p.o. p.r.n. 1-2x daily over-      the-counter.   Note:  Flagyl, Ciprofloxacin and NSAIDs, i.e., Advil, ibuprofen, Aleve  OTC, have all been discontinued.   PROCEDURES:  Colonoscopy done April 14, 2007, by Dr. Juanita Craver,  gastroenterologist.  This showed patchy loss of vascular markings noted  throughout the colon, multiple biopsies were done to rule out  microscopic/lymphocytic colitis.  There was a normal terminal ileum, a  few early sigmoid diverticula, no masses or polyps seen.   CONSULTATIONS:  Dr. Juanita Craver, gastroenterologist.   ADMISSION HISTORY:  As in H and P notes of April 12, 2007, dictated by  Dr. Dominica Severin.  However, in brief, this is a 70 year old  female, with known history of hypertension, migraine headaches,  degenerative arthritis status post right knee arthroscopy March 25, 2007, hypothyroidism, depression, bilateral asymptomatic  nephrolithiasis, status post previous rotator cuff surgery, status post  hysterectomy, possible irritable bowel syndrome, who presents  with  persistent diarrheal illness.  She had reportedly, been discharged from  the [facility] on April 05, 2007, following a 3 day admission  for workup of colitis.  Abdominal CT scan at that time demonstrated left-  sided colitis.  She was evaluated by gastroenterologist, outpatient  colonoscopy planned, and had been discharged on an oral course of Flagyl  and Ciprofloxacin.  As of April 12, 2007, she had completed a 10 day  course of this treatment and was still symptomatic with approximately 5  diarrheal stools per day.  She was admitted for further evaluation,  investigation and management.   CLINICAL COURSE:  1. Acute colitis.  For details of presentation, refer to admission      history above.  The patient was managed with bowel rest,      intravenous fluid hydration and antibiotics.  GI consultation was      kindly provided by Dr. Juanita Craver, who performed a colonoscopy on      April 14, 2007.  For findings, refer to procedure list above.      Findings appeared consistent with possible microscopic or      lymphocytic colitis.  Random multiple biopsies were taken.  As of      the date of this dictation, pathology results were still pending.      The patient responded, however, to above mentioned management      measures.  By April 14, 2007, she was asymptomatic and was able to      commence regular diet.  She has been recommended to avoid NSAIDs,      artificial sweeteners, and continue with probiotics.  By April 15, 2007, she had no recurrence of symptoms and was keen to be      discharged and following okay by gastroenterologist, she was      discharged accordingly.   1. Hypertension.  This was adequately managed on the patient's pre-      admission antihypertensive medications.   1. History of migraines.  This did not prove problematic during the      course of the patient's hospitalization.   1. History of depression.  The patient's mood remained stable       throughout the course of her hospitalization, on pre-admission      dosage of Lexapro.   DISPOSITION:  The patient was on April 15, 2007, considered sufficiently  clinically stable and asymptomatic to be discharged. She was therefore,  discharged accordingly.   DIET:  Heart-healthy low residue diet until indicated otherwise by  gastroenterologist.   ACTIVITY:  As tolerated.   FOLLOWUP INSTRUCTION:  The patient has been scheduled a followup  appointment with Dr. Juanita Craver in 1 week.  She has been supplied  appropriate information.      Sherryl Manges, M.D.  Electronically Signed     CO/MEDQ  D:  04/15/2007  T:  04/15/2007  Job:  [id number]   cc:   Nelwyn Salisbury, M.D.  Fax: [phone]

## 2010-06-10 NOTE — Op Note (Signed)
[name], [name]               ACCOUNT NO.:  [id number]   MEDICAL RECORD NO.:  [id number]          PATIENT TYPE:  INP   LOCATION:  [place]                         FACILITY:  [facility]   PHYSICIAN:  Nelwyn Salisbury, M.D.  DATE OF BIRTH:  02-17-1940   DATE OF PROCEDURE:  04/14/2007  DATE OF DISCHARGE:                               OPERATIVE REPORT   PROCEDURE PERFORMED:  Colonoscopy with multiple cold biopsies.   ENDOSCOPIST:  Nelwyn Salisbury, M.D.   INSTRUMENT USED:  Pentax video colonoscope.   INDICATION FOR PROCEDURE:  A 70 year old white female with a history of  diarrhea since the end of February 2009 after she had antibiotics for  postoperative coverage after arthroscopic knee surgery.  The patient has  had several loose bowel movements per day.  She has had significant  rectal bleeding as well.  Rule out colonic polyps, masses, etc.  There  is a family history of colon cancer in the maternal grandmother.   PREPROCEDURE PREPARATION:  Informed consent was procured from the  patient.  The patient had fasted for 8 hours prior to the procedure  after being prepped with a bottle of magnesium citrate and a gallon of  NuLYTELY the night prior to the procedure.  The risks and benefits of  the procedure, including a 10% miss rate of cancer and polyps, were  discussed with the patient as well.   PREPROCEDURE PHYSICAL:  VITAL SIGNS:  The patient had stable vital  signs.  NECK:  Supple.  CHEST:  Clear to auscultation.  S1, S2 regular.  ABDOMEN:  Soft with normal bowel sounds.   DESCRIPTION OF PROCEDURE:  The patient was placed in the left lateral  decubitus position and sedated with 100 mcg of Fentanyl and 10 mg of  Versed given intravenously in slow incremental doses. Once the patient  was adequately sedate and maintained on low-flow oxygen and continuous  cardiac monitoring, the Pentax video colonoscope was advanced from the  rectum to the cecum.  There was some residual stool in the  colon.  Multiple washes were done.  A few early sigmoid diverticula were noted.  Patchy loss of vascular markings was also identified.  Random biopsies  were done to rule out microscopic colitis.  The appendiceal orifice and  ileocecal valve were visualized and photographed.  The terminal ileum  appeared healthy and without lesion.  Retroflexion in the rectum  revealed no abnormalities.  The patient tolerated the procedure well  without immediate complications.   IMPRESSION:  1. Patchy loss of vascular markings noted throughout the colon,      multiple biopsies done to rule out microscopic/lymphocytic colitis.  2. Normal terminal ileum.  3. Few early sigmoid diverticula.  4. No masses or polyps seen.   RECOMMENDATIONS:  1. Lomotil one p.o. q.6-8 h. p.r.n. for diarrhea.  2. Resume a high-fiber diet.  3. Check TSH and TTG levels.  4. Await pathology results.  5. Avoid artificial sweeteners like Splenda and Equal.  6. Outpatient follow-up after discharge.      Nelwyn Salisbury, M.D.  Electronically Signed  JNM/MEDQ  D:  04/15/2007  T:  04/15/2007  Job:  [id number]   cc:   Reita Cliche  Fax: [phone]   Bobette Mo, MD  [place], [place]

## 2010-06-13 NOTE — Op Note (Signed)
NAME:  [name], [name]                         ACCOUNT NO.:  [id number]   MEDICAL RECORD NO.:  [id number]                   PATIENT TYPE:  AMB   LOCATION:  [place]                                  FACILITY:  [facility]   PHYSICIAN:  Ninetta Lights, M.D.              DATE OF BIRTH:  [birth year]   DATE OF PROCEDURE:  12/28/2002  DATE OF DISCHARGE:                                 OPERATIVE REPORT   PREOPERATIVE DIAGNOSES:  Impingement, left shoulder with torn rotator cuff  and degenerative joint disease, acromioclavicular joint.   POSTOPERATIVE DIAGNOSES:  Impingement, left shoulder with torn rotator cuff  and degenerative joint disease, acromioclavicular joint with labrum tear as  well.   OPERATION PERFORMED:  Left shoulder examination under anesthesia,  arthroscopy, debridement of rotator cuff and labrum.  Acromioplasty with CA  ligament release.  Excision of distal clavicle.  Open repair rotator cuff  tear with FiberWire suture and Concept repair system.   SURGEON:  Ninetta Lights, M.D.   ASSISTANT:  Aaron Edelman D. Petrarca, P.A.-C.   ANESTHESIA:  General.   ESTIMATED BLOOD LOSS:  Minimal.   SPECIMENS:  None.   CULTURES:  None.   COMPLICATIONS:  None.   DRESSING:  Soft compressive with shoulder immobilizer.   DESCRIPTION OF PROCEDURE:  The patient was brought to the operating room and  placed on operating table in supine position.  After adequate anesthesia had  been obtained, placed in a beach chair position on the shoulder positioner,  prepped and draped in the usual sterile fashion.  Shoulder examined with  full motion, good stability.  Three standard arthroscopic portals, anterior,  posterior lateral.  Shoulder entered with blunt obturator, distended and  inspected.  Attritional tearing of the anterior labrum debrided.  Full  thickness tear of the supraspinatus tendon from just on top of the biceps  tendon almost all of the way back to the posterior attachment of the  supraspinatus.  Intra-articular flaps and frayed portions debrided.  Although retracted, mild to moderate, it was still very reparable.  Cannula  redirected subacromially.  Biceps tendon and biceps anchor were intact and  the tendon itself was not subluxed.  Subacromial view typical impingement.  Type 3 acromion.  Bursa resected, cuff debrided and acromioplasty to a type  1 acromion with shaver and high speed bur releasing CA ligament with  cautery.  Distal clavicle grade 4 changes.  Lateral centimeter resected.  Adequacy of decompression and clavicle excision confirmed viewing from all  portals.  Instruments and fluid removed.  Deltoid splitting incision to the  lateral portal exposing subacromial space.  The cuff was debrided back to  healthy tissue.  Well captured with two FiberWire sutures that were weaved  in a Bunnell fashion into the cuff.  They were then brought to the  attachment site on the tuberosity which had been brought down to roughened  bleeding bone with a bur.  Cuff was firmly repaired back to the humerus  through drill holes with Concept repair system.  The front of the cable was  anchored right at the biceps so that the biceps would remain anchored.  Sutures were brought through bony tunnels and tied over a bony bridge  yielding a nice firm watertight closure of the cuff without undue tension.  Full passive motion on completion.  Adequacy of decompression confirmed  digitally at time of cuff repair.  Wound irrigated.  Deltoid closed with  Vicryl.  Skin and subcutaneous with Vicryl.  Portals closed with 4-0 nylon.  Margins of wounds injected with Marcaine.  Sterile compressive dressing with  shoulder immobilizer applied.  Anesthesia reversed.  Brought to recovery  room.  Tolerated surgery well without complication.                                               Ninetta Lights, M.D.    DFM/MEDQ  D:  12/28/2002  T:  12/29/2002  Job:  [id number]

## 2010-06-13 NOTE — Op Note (Signed)
NAME:  [name], [name]                         ACCOUNT NO.:  [id number]   MEDICAL RECORD NO.:  [id number]                   PATIENT TYPE:  AMB   LOCATION:  [place]                                  FACILITY:  [facility]   PHYSICIAN:  Crissie Reese, M.D.                  DATE OF BIRTH:  Feb 13, 1940   DATE OF PROCEDURE:  10/16/2002  DATE OF DISCHARGE:                                 OPERATIVE REPORT   PREOPERATIVE DIAGNOSES:  1. Lesion of undetermined behavior of face, 0.5 cm.  2. Multiple lesions of undetermined behavior, trunk, total of six.  3. Subcutaneous mass of undetermined behavior, irritated, enlarging, right     posterior leg.   POSTOPERATIVE DIAGNOSES:  1. Lesion of undetermined behavior of face, 0.5 cm.  2. Multiple lesions of undetermined behavior of trunk, total of six.  3. Subcutaneous mass of undetermined behavior, irrigated, enlarging, right     posterior leg.  4. Wounds of the trunk and leg, total length greater than 4.0 cm.  5. Wound of face greater than 1.0 cm.   PROCEDURES:  1. Excision of lesion of face, undetermined behavior, 0.5 cm.  2. Excision of multiple lesions of trunk, total of six, 0.5 cm.  3. Excision of subcutaneous mass of the posterior calf, greater than 1.0 cm.  4. Intermediate wound closure of face, greater than 1.0.  5. Intermediate closures of the trunk, greater than 4.0 cm.   SURGEON:  Crissie Reese, M.D.   ANESTHESIA:  1% Xylocaine with epinephrine plus bicarbonate.   CLINICAL NOTE:  The patient is a 70 year old woman with multiple lesions.  Some of these have enlarged, they have changed or they have been irritated  and an excision is medically necessary.  In addition to this, she has a  subcutaneous lesion of her posterior right calf that has enlarged and been  inflamed at times.  She would like to have all of these excised.  The  procedures and the risks were understood by her and she wished to proceed.   DESCRIPTION OF PROCEDURE:  The patient  was placed first supine.  She was  prepped with Betadine and draped with sterile drapes.  Satisfactory local  anesthesia was achieved and the lesions were excised.  Layered closures with  5-0 Monocryl with interrupted deep sutures and 5-0 Monocryl running  subcuticular and for the face 6-0 Prolene simple interrupted sutures.  Antibiotic ointment and dry sterile dressings were applied to all sites.  The patient was then placed prone and again she was prepped with Betadine  and draped with sterile drapes.  Satisfactory local anesthesia was achieved.  An elliptical excision was performed excising the lesion in its entirety.  The wound was irrigated thoroughly and excellent hemostasis having been  confirmed layer closure with 4-0 Monocryl  interrupted deep sutures and 4-0 Monocryl running subcuticular suture.  Antibiotic ointment and a dry sterile dressing were applied.  The patient  tolerated this well.   DISPOSITION:  We will see her back next week in the office.                                               Crissie Reese, M.D.    DB/MEDQ  D:  10/16/2002  T:  10/16/2002  Job:  [id number]

## 2010-06-13 NOTE — Op Note (Signed)
[facility]  Patient:    [name], [name]                      MRN: [id number] Proc. Date: 09/12/99 Adm. Date:  [id number] Disc. Date: [id number] Attending:  Veverly Fells                           Operative Report  PREOPERATIVE DIAGNOSIS:  Right lower ureteral stone.  POSTOPERATIVE DIAGNOSIS:  Right lower ureteral stone.  OPERATION PERFORMED:  Cystoscopy, right retrograde pyelogram, extraction of stone.  SURGEON:  Sigmund I. Gaynelle Arabian, M.D.  ANESTHESIA:  General.  DESCRIPTION OF PROCEDURE:  After preanesthesia, the patient is brought to the operating room and placed on the operating room in the dorsosupine position where general anesthesia was introduced.  She was then replaced in the low Allen stirrups dorsolithotomy position where the pubis was prepped with Betadine solution and draped in the usual fashion.  Cystourethroscopy revealed that the right lower ureteral stone was in the bladder at the level of the right ureteral orifice.  The stone was removed with stone graspers.  A retrograde pyelogram showed no other stone material and Xylocaine was placed in the ureter.  Xylocaine jelly was placed in the bladder.  The patient was given IV Toradol, awakened and taken to the recovery room in good condition. DD:  09/12/99 TD:  09/14/99 Job: [id number]

## 2010-07-18 ENCOUNTER — Encounter (INDEPENDENT_AMBULATORY_CARE_PROVIDER_SITE_OTHER): Payer: Self-pay | Admitting: General Surgery

## 2010-07-29 ENCOUNTER — Ambulatory Visit (INDEPENDENT_AMBULATORY_CARE_PROVIDER_SITE_OTHER): Payer: Medicare Other | Admitting: General Surgery

## 2010-07-29 ENCOUNTER — Encounter (INDEPENDENT_AMBULATORY_CARE_PROVIDER_SITE_OTHER): Payer: Self-pay | Admitting: General Surgery

## 2010-07-29 VITALS — Temp 98.7°F

## 2010-07-29 DIAGNOSIS — K5732 Diverticulitis of large intestine without perforation or abscess without bleeding: Secondary | ICD-10-CM

## 2010-07-29 NOTE — Progress Notes (Signed)
Subjective:     [name], female   DOB: 1941-01-23, 70 y.o.   MRN: [id number]    Temp(Src) 98.7 F (37.1 C) (Oral)    HPI  Patient is a 70 year old female with diverticulitis I have been seeing for a while. She has just recently had another episode. She took a 10 day course of antibiotics and had resolution of her symptoms of cramping and change in her stools. She denies any fevers or chills throughout the episode. She has been off antibiotics for 2-3 days and has not had any recurrence of her symptoms. She has been tolerating a regular diet. She has been able to work.  Review of Systems  Constitutional: Negative.   HENT: Negative.   Eyes: Negative.   Respiratory: Negative.   Cardiovascular: Negative.   Gastrointestinal: Negative.   Genitourinary: Negative.   Neurological: Negative.   Hematological: Negative.   Psychiatric/Behavioral: Negative.        Objective:   Physical Exam  Constitutional: She is oriented to person, place, and time. She appears well-developed and well-nourished. No distress.  HENT:  Head: Normocephalic and atraumatic.  Mouth/Throat: No oropharyngeal exudate.  Eyes: Conjunctivae are normal. Pupils are equal, round, and reactive to light. No scleral icterus.  Pulmonary/Chest: Effort normal. No respiratory distress.  Abdominal: Soft. She exhibits no distension and no mass. There is no tenderness. There is no rebound and no guarding.  Musculoskeletal: Normal range of motion.  Neurological: She is alert and oriented to person, place, and time. Coordination normal.  Skin: Skin is warm and dry. No rash noted. She is not diaphoretic. No erythema.  Psychiatric: She has a normal mood and affect. Her behavior is normal. Judgment and thought content normal.       Assessment:        Plan:

## 2010-07-29 NOTE — Assessment & Plan Note (Signed)
She is now doing well.  I have advised her to call early if she develops recurrent symptoms.  All her episodes of diverticulitis have been mild.  She does not need surgery at this time.

## 2010-09-22 ENCOUNTER — Other Ambulatory Visit (INDEPENDENT_AMBULATORY_CARE_PROVIDER_SITE_OTHER): Payer: Self-pay

## 2010-09-22 DIAGNOSIS — K5792 Diverticulitis of intestine, part unspecified, without perforation or abscess without bleeding: Secondary | ICD-10-CM

## 2010-09-22 MED ORDER — CIPROFLOXACIN HCL 500 MG PO TABS: 500.0000 mg | ORAL_TABLET | Freq: Two times a day (BID) | ORAL | Status: AC

## 2010-09-22 MED ORDER — METRONIDAZOLE 500 MG PO TABS: 500.0000 mg | ORAL_TABLET | Freq: Two times a day (BID) | ORAL | Status: AC

## 2010-09-24 ENCOUNTER — Telehealth (INDEPENDENT_AMBULATORY_CARE_PROVIDER_SITE_OTHER): Payer: Self-pay | Admitting: General Surgery

## 2010-09-24 NOTE — Telephone Encounter (Signed)
PT CALLED REQUESTING ORDER FOR PHENERGAN DUE TO NAUSEA CAUSED BY 2 ANTIBIOTICS PRESCRIBED BY DR. BYERLY. DR. Georgette Dover GAVE OK TO CALL PHENERGAN 25MG  PO #30 TO [facility]  [phone]/ PT AWARE.

## 2010-09-26 ENCOUNTER — Other Ambulatory Visit (HOSPITAL_COMMUNITY): Payer: Medicare Other

## 2010-10-01 NOTE — H&P (Signed)
[name], HAKES NO.:  [id number]  MEDICAL RECORD NO.:  [id number]  LOCATION:                               FACILITY:  [facility]  PHYSICIAN:  Gaynelle Arabian, M.D.    DATE OF BIRTH:  1941-01-15  DATE OF ADMISSION:  12/17/2010 DATE OF DISCHARGE:                             HISTORY & PHYSICAL   CHIEF COMPLAINT:  Painful right total knee.  HISTORY OF PRESENT ILLNESS:  The patient is a 70 year old female who has been seen by Dr. Wynelle Link in a second opinion earlier this year in April. She is an IT consultant at [employer] and has been seen by Dr. Wynelle Link for a right knee.  She had a right total knee arthroplasty done by Dr. Lind Guest. Blackman back in 2009.  She initially did well and did not have any problems following the initial surgery.  No problems with wound, drainage, or infection.  She did well for the first few months, but then started having knee problems with lot of popping.  She is unable to go up and down steps, occasionally it feels warmth to her.  She was seen by Dr. Wynelle Link for a second opinion. She is found to have a kind of a patella clunk issue, but ongoing pain and dysfunction.  She tried using a brace which helped with instability. Thus, confirming part of her issue is some instability with the knee. Consideration a possible scope to remove the synovial-type patellar clunk lesion versus full revision to address the instability, the patient has elected to proceed with having the full procedure done. Risks and benefits had been discussed.  She elected to proceed with surgery.  No contraindications to the surgery such as ongoing infection or progressive neurological disease.  ALLERGIES: 1. MORPHINE causes migraines. 2. DEMEROL causes a rash.  CURRENT MEDICATIONS:  Citalopram, benazepril, amlodipine, estradiol.  PAST MEDICAL HISTORY: 1. Depression. 2. Hypertension. 3. Hypercholesterolemia. 4. Diverticulosis. 5.  Renal calculi. 6. Osteoporosis. 7. Postmenopausal.  PAST SURGICAL HISTORY:  Hysterectomy, right total knee replacement, rotator cuff repair, right bunion surgery, and bladder suspension procedure.  FAMILY HISTORY:  Father with heart disease and skin cancer.  Mother with Alzheimer's.  SOCIAL HISTORY:  Married, retired, nonsmoker.  No alcohol.  Three children.  She does have a caregiver lined up.  She has three steps entering her home.  REVIEW OF SYSTEMS:  GENERAL:  No fevers, chills, or night sweats. NEURO:  No seizures, syncope, or paralysis.  RESPIRATORY:  No shortness breath, productive cough, or hemoptysis.  CARDIOVASCULAR:  No chest pain or orthopnea.  GI:  Some intermittent constipation or diarrhea.  No nausea or vomiting.  GU:  No dysuria, hematuria, or discharge. MUSCULOSKELETAL:  Knee pain.  PHYSICAL EXAMINATION:  VITAL SIGNS:  Pulse 92, respirations 16, and blood pressure 149/82. GENERAL:  84-year white female, well nourished, well developed, in no acute distress.  She is alert, oriented, cooperative, pleasant, excellent historian. HEENT:  Normocephalic, atraumatic.  Pupils are round and reactive.  EOMs intact.  Noted wearing glasses and contacts at times. NECK:  Supple.  No carotid bruits. CHEST:  Clear, anterior posterior chest walls.  No rhonchi, rales,  or wheezing. HEART:  Regular rate and rhythm.  No murmur, S1, S2 noted. ABDOMEN:  Soft, nontender.  Bowel sounds present. RECTAL, BREASTS, AND GENITALIA:  Not done, not pertinent to present illness. EXTREMITIES:  Right knee previous incision is well healed.  Range of motion 5-115.  She does have some varus valgus play with some AP laxity at 90 degrees of flexion.  She has some crepitus noted with a patella clunk sound on exam.  IMPRESSION:  Painful right total knee arthroplasty.  PLAN:  The patient admitted to [facility] to undergo revision of right total knee surgery and will be performed by Dr. Gaynelle Arabian. Her plan is to go home following her hospital stay.     Alexzandrew L. Dara Lords, P.A.C.   ______________________________ Gaynelle Arabian, M.D.    ALP/MEDQ  D:  09/29/2010  T:  09/29/2010  Job:  [id number]  cc:   [facility]  Electronically Signed by Mickel Crow P.A.C. on 10/01/2010 10:01:25 AM Electronically Signed by Gaynelle Arabian M.D. on 10/01/2010 10:05:16 AM

## 2010-10-17 LAB — BASIC METABOLIC PANEL
CO2: 26
Chloride: 101
GFR calc Af Amer: >60
Glucose, Bld: 118 — ABNORMAL HIGH
Potassium: 4.2
Sodium: 136

## 2010-10-20 LAB — CBC
HCT: 40.4
HCT: 40.4
HCT: 43.5
HCT: 37.3
HCT: 39.1
HCT: 40.2
HCT: 41.6
Hemoglobin: 13.9
Hemoglobin: 13.9
Hemoglobin: 14.9
Hemoglobin: 15.1 — ABNORMAL HIGH
Hemoglobin: 12.7
Hemoglobin: 13.9
Hemoglobin: 14.3
MCHC: 33.5
MCHC: 34.2
MCHC: 34.4
MCHC: 34.5
MCHC: 34
MCHC: 34.3
MCHC: 34.6
MCV: 88.9
MCV: 89.9
MCV: 90.4
MCV: 88.8
MCV: 88.8
MCV: 89.5
MCV: 89.5
Platelets: 270
Platelets: 274
Platelets: 222
Platelets: 237
Platelets: 283
RBC: 4.49
RBC: 4.55
RBC: 4.81
RBC: 4.96
RBC: 4.17
RBC: 4.53
RBC: 4.65
RDW: 13.2
RDW: 13.3
RDW: 13.6
RDW: 13.8
RDW: 13.9
WBC: 13.9 — ABNORMAL HIGH
WBC: 16 — ABNORMAL HIGH
WBC: 20.9 — ABNORMAL HIGH
WBC: 7
WBC: 7.7
WBC: 9.8

## 2010-10-20 LAB — PROTIME-INR
INR: 1.2
Prothrombin Time: 13

## 2010-10-20 LAB — POCT CARDIAC MARKERS
Operator id: 282201
Troponin i, poc: <0.05

## 2010-10-20 LAB — COMPREHENSIVE METABOLIC PANEL
ALT: 21
AST: 17
AST: 26
BUN: 5 — ABNORMAL LOW
CO2: 24
CO2: 24
Calcium: 9.2
Calcium: 9
Chloride: 103
Creatinine, Ser: 0.87
Creatinine, Ser: 0.88
GFR calc Af Amer: >60
GFR calc non Af Amer: >60
GFR calc non Af Amer: >60
Glucose, Bld: 124 — ABNORMAL HIGH
Total Bilirubin: 1

## 2010-10-20 LAB — BASIC METABOLIC PANEL
BUN: 4 — ABNORMAL LOW
CO2: 25
CO2: 26
CO2: 28
Calcium: 9
Calcium: 8.1 — ABNORMAL LOW
Chloride: 105
Creatinine, Ser: 0.82
GFR calc Af Amer: >60
GFR calc Af Amer: >60
GFR calc non Af Amer: >60
Glucose, Bld: 113 — ABNORMAL HIGH
Potassium: 5.2 — ABNORMAL HIGH
Potassium: 3.8
Sodium: 137
Sodium: 137

## 2010-10-20 LAB — CULTURE, BLOOD (ROUTINE X 2): Culture: NO GROWTH

## 2010-10-20 LAB — DIFFERENTIAL
Basophils Absolute: 0
Basophils Absolute: 0
Basophils Relative: 0
Eosinophils Absolute: 0
Eosinophils Absolute: 0.1
Eosinophils Relative: 0
Eosinophils Relative: 1
Lymphocytes Relative: 14
Lymphocytes Relative: 27
Lymphs Abs: 2.8
Lymphs Abs: 2.6
Monocytes Absolute: 0.8
Monocytes Relative: 8
Neutro Abs: 6.3
Neutrophils Relative %: 81 — ABNORMAL HIGH
Neutrophils Relative %: 64

## 2010-10-20 LAB — URINE CULTURE: Colony Count: 65000

## 2010-10-20 LAB — CLOSTRIDIUM DIFFICILE EIA
C difficile Toxins A+B, EIA: NEGATIVE
C difficile Toxins A+B, EIA: NEGATIVE

## 2010-10-20 LAB — BASIC METABOLIC PANEL WITH GFR
BUN: 10
BUN: 2 — ABNORMAL LOW
CO2: 28
CO2: 24
Calcium: 8.9
Calcium: 8.2 — ABNORMAL LOW
Chloride: 105
Chloride: 106
Creatinine, Ser: 0.96
Creatinine, Ser: 0.75
GFR calc non Af Amer: 59 — ABNORMAL LOW
GFR calc non Af Amer: >60
Glucose, Bld: 84
Glucose, Bld: 102 — ABNORMAL HIGH
Potassium: 3.7
Potassium: 3.6
Sodium: 140
Sodium: 136

## 2010-10-20 LAB — URINALYSIS, ROUTINE W REFLEX MICROSCOPIC
Nitrite: NEGATIVE
Nitrite: NEGATIVE
Specific Gravity, Urine: 1.03
Specific Gravity, Urine: 1.007
Urobilinogen, UA: 0.2
Urobilinogen, UA: 0.2
pH: 6.5

## 2010-10-20 LAB — URINE MICROSCOPIC-ADD ON

## 2010-10-20 LAB — GIARDIA/CRYPTOSPORIDIUM SCREEN(EIA)
Cryptosporidium Screen (EIA): NEGATIVE
Giardia Screen - EIA: NEGATIVE
Giardia Screen - EIA: NEGATIVE

## 2010-10-20 LAB — LIPASE, BLOOD
Lipase: 32
Lipase: 53

## 2010-10-20 LAB — APTT: aPTT: 32

## 2010-10-20 LAB — LACTIC ACID, PLASMA: Lactic Acid, Venous: 2

## 2010-10-20 LAB — TSH: TSH: 0.205 — ABNORMAL LOW

## 2010-10-20 LAB — OCCULT BLOOD X 1 CARD TO LAB, STOOL: Fecal Occult Bld: POSITIVE

## 2010-10-27 LAB — COMPREHENSIVE METABOLIC PANEL
ALT: 18
AST: 17
CO2: 29
Chloride: 105
Creatinine, Ser: 0.93
GFR calc Af Amer: >60
GFR calc non Af Amer: >60
Sodium: 141
Total Bilirubin: 0.7

## 2010-10-27 LAB — URINE MICROSCOPIC-ADD ON

## 2010-10-27 LAB — ABO/RH: ABO/RH(D): O POS

## 2010-10-27 LAB — URINALYSIS, ROUTINE W REFLEX MICROSCOPIC
Bilirubin Urine: NEGATIVE
Glucose, UA: NEGATIVE
Hgb urine dipstick: NEGATIVE
Protein, ur: NEGATIVE

## 2010-10-27 LAB — BASIC METABOLIC PANEL
CO2: 33 — ABNORMAL HIGH
CO2: 33 — ABNORMAL HIGH
Chloride: 105
Chloride: 98
Creatinine, Ser: 0.97
GFR calc Af Amer: >60
GFR calc Af Amer: >60
GFR calc non Af Amer: 60 — ABNORMAL LOW
Glucose, Bld: 130 — ABNORMAL HIGH
Potassium: 4
Potassium: 4.4
Potassium: 4.7
Sodium: 139
Sodium: 142

## 2010-10-27 LAB — CBC
HCT: 31.6 — ABNORMAL LOW
HCT: 32.8 — ABNORMAL LOW
HCT: 35.8 — ABNORMAL LOW
Hemoglobin: 10.5 — ABNORMAL LOW
Hemoglobin: 11.1 — ABNORMAL LOW
MCHC: 33.2
MCHC: 33.8
MCV: 91.3
MCV: 91.6
MCV: 92.1
RBC: 3.41 — ABNORMAL LOW
RBC: 3.58 — ABNORMAL LOW
RBC: 3.88
RBC: 4.65
RDW: 12.6
WBC: 15 — ABNORMAL HIGH
WBC: 6.4

## 2010-10-27 LAB — PROTIME-INR
INR: 1.9 — ABNORMAL HIGH
Prothrombin Time: 20.3 — ABNORMAL HIGH

## 2010-11-04 LAB — CBC
HCT: 43.1
Hemoglobin: 14.9
MCV: 88.6
RBC: 4.87
WBC: 8.5

## 2010-11-04 LAB — CARDIAC PANEL(CRET KIN+CKTOT+MB+TROPI)
CK, MB: 1
Relative Index: INVALID
Relative Index: INVALID
Relative Index: INVALID
Total CK: 65
Total CK: 74
Troponin I: 0.01
Troponin I: 0.01

## 2010-11-04 LAB — POCT I-STAT CREATININE
Creatinine, Ser: 0.9
Operator id: 198171

## 2010-11-04 LAB — I-STAT 8, (EC8 V) (CONVERTED LAB)
Bicarbonate: 24.7 — ABNORMAL HIGH
HCT: 47 — ABNORMAL HIGH
Hemoglobin: 16 — ABNORMAL HIGH
Operator id: 198171
Sodium: 138
TCO2: 26
pCO2, Ven: 36.7 — ABNORMAL LOW

## 2010-11-04 LAB — APTT: aPTT: 29

## 2010-11-04 LAB — LIPID PANEL
LDL Cholesterol: 124 — ABNORMAL HIGH
Total CHOL/HDL Ratio: 4.8
VLDL: 16

## 2010-11-04 LAB — HEPATIC FUNCTION PANEL
ALT: 22
AST: 22
Albumin: 3.7
Bilirubin, Direct: 0.1

## 2010-11-04 LAB — POCT CARDIAC MARKERS
CKMB, poc: <1 — ABNORMAL LOW
Myoglobin, poc: 88
Operator id: 198171
Troponin i, poc: <0.05

## 2010-11-04 LAB — BASIC METABOLIC PANEL
Chloride: 105
GFR calc Af Amer: >60
GFR calc non Af Amer: 55 — ABNORMAL LOW
Potassium: 3.8
Sodium: 139

## 2010-11-04 LAB — PROTIME-INR: INR: 1

## 2010-11-04 LAB — T4: T4, Total: 6.8

## 2010-12-05 ENCOUNTER — Other Ambulatory Visit: Payer: Self-pay | Admitting: Orthopedic Surgery

## 2010-12-05 NOTE — H&P (Signed)
[name] DOB: March 29, 1940  Chief Complaint:  Painful Right Total Knee  History of Present Illness(DREW L PERKINS, PA-C; 2010-12-15 3:55 PM) The patient is a 70 year old female who comes in today for a preoperative History and Physical. The patient is scheduled for a right total knee revision to be performed by Dr. Dione Plover. Aluisio, MD at [facility] on 12/17/2010.   Problem List/Past Medical(DREW L PERKINS, PA-C; 2010/12/15 4:11 PM) Depression Hypertension Hypercholesterolemia Diverticulitis Of Colon Kidney Stone Osteoporosis Menopause  Allergies(DREW L PERKINS, PA-C; 15-Dec-2010 3:57 PM) MORPHINE. 07/03/2004 Rash. DEMEROL. 07/03/2004 Headache. Migraines   Family History(DREW L PERKINS, PA-C; December 15, 2010 4:03 PM) Father. Deceased, Heart disease, Skin Cancer, Coronary artery disease, CABG. Age 52 Mother. Deceased, Alzheimer's disease. Age 91  Social History(DREW L PERKINS, PA-C; 2010-12-15 4:02 PM) Tobacco use Alcohol use. Never consumed alcohol. Marital status. Married. Children. 3 Living situation. Lives with spouse. Current work status. Retired. Post-Surgical Plans. Plans for Home  Medication History(DREW L PERKINS, PA-C; 12/15/2010 3:57 PM) Zolpidem Tartrate (10MG  Tablet, Oral) Active. Amlodipine Besy-Benazepril HCl (5-20MG  Capsule, Oral) Active. Tramadol-Acetaminophen (37.5-325MG  Tablet, Oral) Active. Krill Oil (300MG  Capsule, Oral) Active.  Past Surgical History(DREW L PERKINS, PA-C; Dec 15, 2010 4:00 PM) Bladder Surgery Rotator Cuff Repair - Left Total Knee Replacement - Right Foot Surgery - Right. Bunion Hysterectomy (not due to cancer) - Partial  Review of Systems(DREW L PERKINS, PA-C; 12/15/10 3:56 PM) General:Not Present- Chills, Fever, Night Sweats, Fatigue, Weight Gain, Weight Loss and Memory Loss. Skin:Not Present- Hives, Itching, Rash, Eczema and Lesions. HEENT:Not Present- Tinnitus, Headache, Double Vision, Visual  Loss, Hearing Loss and Dentures. Respiratory:Not Present- Shortness of breath with exertion, Shortness of breath at rest, Allergies, Coughing up blood and Chronic Cough. Cardiovascular:Not Present- Chest Pain, Racing/skipping heartbeats, Difficulty Breathing Lying Down, Murmur, Swelling and Palpitations. Gastrointestinal:Not Present- Bloody Stool, Heartburn, Abdominal Pain, Vomiting, Nausea, Constipation, Diarrhea, Difficulty Swallowing, Jaundice and Loss of appetitie. Female Genitourinary:Not Present- Blood in Urine, Urinary frequency, Weak urinary stream, Discharge, Flank Pain, Incontinence, Painful Urination, Urgency, Urinary Retention and Urinating at Night. Musculoskeletal:Present- Muscle Weakness, Joint Swelling, Joint Pain, Muscle Atrophy and Muscle Cramps. Not Present- Muscle Pain, Back Pain, Morning Stiffness and Spasms. Neurological:Not Present- Tremor, Dizziness, Blackout spells, Paralysis, Difficulty with balance and Weakness. Psychiatric:Not Present- Insomnia.  Vitals(DREW L PERKINS, PA-C; 2010-12-15 4:08 PM) 12/15/10 4:07 PM Weight: 157 lb Height: 63 in Weight was reported by patient. Height was reported by patient. Body Surface Area: 1.78 m Body Mass Index: 27.81 kg/m Pulse: 84 (Regular) Resp.: 14 (Unlabored) BP: 126/78 (Sitting, Right Arm, Standard)  Physical Exam(DREW L PERKINS, PA-C; 2010-12-15 4:10 PM) The physical exam findings are as follows:  General Mental Status - Alert, cooperative and good historian. General Appearance- pleasant. Not in acute distress. Orientation- Oriented X3. Build & Nutrition- Well nourished and Well developed.   Head and Neck Head- normocephalic, atraumatic . Neck Global Assessment- supple. no bruit auscultated on the right and no bruit auscultated on the left.  Eye Pupil- Bilateral- PERRLA. Motion- Bilateral- EOMI.  Chest and Lung Exam Auscultation: Breath sounds:- clear at anterior chest wall  and - clear at posterior chest wall. Adventitious sounds:- No Adventitious sounds.  Cardiovascular Auscultation:Rhythm- Regular rate and rhythm. Heart Sounds- S1 WNL and S2 WNL. Murmurs & Other Heart Sounds:Auscultation of the heart reveals - No Murmurs.  Abdomen Palpation/Percussion:Tenderness- Abdomen is non-tender to palpation. Rigidity (guarding)- Abdomen is soft. Auscultation:Auscultation of the abdomen reveals - Bowel sounds normal.  Female Genitourinary Note: Not done, not pertinent to  present illness  Musculoskeletal Right Knee - Previous incision well-healed, ROM 5-115, Varus/Valgus play, AP laxity, Crepitus with a patellar clunk.  Assessment & Plan(DREW L PERKINS, PA-C; 12/04/2010 4:12 PM) Painful total knee replacement (996.77) Impression: Right Knee  Note: Plan is for a Revision Right Total Knee Replacement. Risks and benefits of the surgery have been discussed with the patient and they elect to proceed with surgery.  There are on active contraindications to upcoming procedure such as ongoing infection or progressive neurological disease.

## 2010-12-08 ENCOUNTER — Encounter (HOSPITAL_COMMUNITY): Payer: Self-pay

## 2010-12-08 ENCOUNTER — Ambulatory Visit (HOSPITAL_COMMUNITY)
Admission: RE | Admit: 2010-12-08 | Discharge: 2010-12-08 | Disposition: A | Discharge status: Home / Self Care | Payer: Medicare Other | Source: Ambulatory Visit | Attending: Orthopedic Surgery | Admitting: Orthopedic Surgery

## 2010-12-08 ENCOUNTER — Encounter (HOSPITAL_COMMUNITY): Payer: Medicare Other

## 2010-12-08 ENCOUNTER — Other Ambulatory Visit: Payer: Self-pay

## 2010-12-08 DIAGNOSIS — Z01812 Encounter for preprocedural laboratory examination: Secondary | ICD-10-CM | POA: Insufficient documentation

## 2010-12-08 LAB — CBC
HCT: 40.9 % (ref 36.0–46.0)
MCV: 86.3 fL (ref 78.0–100.0)
RBC: 4.74 MIL/uL (ref 3.87–5.11)
WBC: 6.8 10*3/uL (ref 4.0–10.5)

## 2010-12-08 LAB — PROTIME-INR
INR: 0.99 (ref 0.00–1.49)
Prothrombin Time: 13.3 seconds (ref 11.6–15.2)

## 2010-12-08 LAB — URINALYSIS, ROUTINE W REFLEX MICROSCOPIC
Bilirubin Urine: NEGATIVE
Ketones, ur: NEGATIVE mg/dL
Nitrite: NEGATIVE
Urobilinogen, UA: 0.2 mg/dL (ref 0.0–1.0)
pH: 6.5 (ref 5.0–8.0)

## 2010-12-08 LAB — COMPREHENSIVE METABOLIC PANEL
BUN: 13 mg/dL (ref 6–23)
CO2: 28 mEq/L (ref 19–32)
Chloride: 102 mEq/L (ref 96–112)
Creatinine, Ser: 0.85 mg/dL (ref 0.50–1.10)
GFR calc Af Amer: 80 mL/min — ABNORMAL LOW (ref >90)
GFR calc non Af Amer: 69 mL/min — ABNORMAL LOW (ref >90)
Glucose, Bld: 104 mg/dL — ABNORMAL HIGH (ref 70–99)
Total Bilirubin: 0.3 mg/dL (ref 0.3–1.2)

## 2010-12-08 NOTE — Patient Instructions (Signed)
[place]  12/08/2010   Your procedure is scheduled on:  Wed.12/17/2010  Report to [facility] at 930 AM.  Call this number if you have problems the morning of surgery: [phone]   Remember:   Do not eat food:After Midnight.  Do not drink clear liquids: After Midnight.  Take these medicines the morning of surgery with A SIP OF WATER: none   Do not wear jewelry, make-up or nail polish.  Do not wear lotions, powders, or perfumes.  Do not shave 48 hours prior to surgery.  Do not bring valuables to the hospital.  Contacts, dentures or bridgework may not be worn into surgery.  Leave suitcase in the car. After surgery it may be brought to your room.  For patients admitted to the hospital, checkout time is 11:00 AM the day of discharge.   Patients discharged the day of surgery will not be allowed to drive home.  Name and phone number of your driver:   Special Instructions: CHG Shower Use Special Wash: 1/2 bottle night before surgery and 1/2 bottle morning of surgery.   Please read over the following fact sheets that you were given: MRSA Information

## 2010-12-16 MED ORDER — BUPIVACAINE 0.25 % ON-Q PUMP SINGLE CATH 300ML
300.0000 mL | INJECTION | Status: DC
  Filled 2010-12-16: qty 300

## 2010-12-17 ENCOUNTER — Inpatient Hospital Stay (HOSPITAL_COMMUNITY)
Admission: RE | Admit: 2010-12-17 | Discharge: 2010-12-21 | DRG: 467 | Disposition: A | Discharge status: Home Health | Payer: Medicare Other | Source: Ambulatory Visit | Attending: Orthopedic Surgery | Admitting: Orthopedic Surgery

## 2010-12-17 ENCOUNTER — Encounter (HOSPITAL_COMMUNITY)
Admission: RE | Disposition: A | Discharge status: Home Health | Payer: Self-pay | Source: Ambulatory Visit | Attending: Orthopedic Surgery

## 2010-12-17 ENCOUNTER — Encounter (HOSPITAL_COMMUNITY): Payer: Self-pay | Admitting: Anesthesiology

## 2010-12-17 ENCOUNTER — Inpatient Hospital Stay (HOSPITAL_COMMUNITY): Payer: Medicare Other | Admitting: Anesthesiology

## 2010-12-17 ENCOUNTER — Encounter (HOSPITAL_COMMUNITY): Payer: Self-pay | Admitting: Orthopedic Surgery

## 2010-12-17 ENCOUNTER — Encounter (HOSPITAL_COMMUNITY): Payer: Self-pay | Admitting: *Deleted

## 2010-12-17 DIAGNOSIS — Y831 Surgical operation with implant of artificial internal device as the cause of abnormal reaction of the patient, or of later complication, without mention of misadventure at the time of the procedure: Secondary | ICD-10-CM | POA: Diagnosis present

## 2010-12-17 DIAGNOSIS — M1710 Unilateral primary osteoarthritis, unspecified knee: Secondary | ICD-10-CM | POA: Diagnosis present

## 2010-12-17 DIAGNOSIS — T84029A Dislocation of unspecified internal joint prosthesis, initial encounter: Principal | ICD-10-CM | POA: Diagnosis present

## 2010-12-17 DIAGNOSIS — E039 Hypothyroidism, unspecified: Secondary | ICD-10-CM | POA: Diagnosis present

## 2010-12-17 DIAGNOSIS — D62 Acute posthemorrhagic anemia: Secondary | ICD-10-CM | POA: Diagnosis not present

## 2010-12-17 DIAGNOSIS — Z96659 Presence of unspecified artificial knee joint: Secondary | ICD-10-CM

## 2010-12-17 HISTORY — PX: TOTAL KNEE REVISION: SHX996

## 2010-12-17 LAB — TYPE AND SCREEN

## 2010-12-17 SURGERY — TOTAL KNEE REVISION
Anesthesia: General | Site: Knee | Laterality: Right | Wound class: Clean

## 2010-12-17 MED ORDER — METHOCARBAMOL 500 MG PO TABS
500.0000 mg | ORAL_TABLET | Freq: Four times a day (QID) | ORAL | Status: DC | PRN
  Administered 2010-12-18 – 2010-12-20 (×10): 500 mg via ORAL
  Filled 2010-12-17 (×12): qty 1

## 2010-12-17 MED ORDER — HYDROMORPHONE HCL PF 1 MG/ML IJ SOLN
0.5000 mg | Freq: Once | INTRAMUSCULAR | Status: AC
  Administered 2010-12-17: 0.5 mg via INTRAVENOUS

## 2010-12-17 MED ORDER — METOCLOPRAMIDE HCL 10 MG PO TABS: 5.0000 mg | ORAL_TABLET | Freq: Three times a day (TID) | ORAL | Status: DC | PRN

## 2010-12-17 MED ORDER — DOCUSATE SODIUM 100 MG PO CAPS
100.0000 mg | ORAL_CAPSULE | Freq: Two times a day (BID) | ORAL | Status: DC
  Administered 2010-12-18 – 2010-12-19 (×2): 100 mg via ORAL
  Filled 2010-12-17 (×9): qty 1

## 2010-12-17 MED ORDER — BUPIVACAINE ON-Q PAIN PUMP (FOR ORDER SET NO CHG)
INJECTION | Status: AC
  Filled 2010-12-17: qty 1

## 2010-12-17 MED ORDER — ONDANSETRON HCL 4 MG/2ML IJ SOLN: 4.0000 mg | Freq: Four times a day (QID) | INTRAMUSCULAR | Status: DC | PRN

## 2010-12-17 MED ORDER — SODIUM CHLORIDE 0.9 % IJ SOLN: 9.0000 mL | INTRAMUSCULAR | Status: DC | PRN

## 2010-12-17 MED ORDER — CEFAZOLIN SODIUM 1-5 GM-% IV SOLN: 1.0000 g | Freq: Once | INTRAVENOUS | Status: DC

## 2010-12-17 MED ORDER — HYDROMORPHONE HCL PF 1 MG/ML IJ SOLN
0.2500 mg | INTRAMUSCULAR | Status: DC | PRN
  Administered 2010-12-17 (×4): 0.5 mg via INTRAVENOUS

## 2010-12-17 MED ORDER — NEOSTIGMINE METHYLSULFATE 1 MG/ML IJ SOLN
INTRAMUSCULAR | Status: DC | PRN
  Administered 2010-12-17: 1 mg via INTRAVENOUS

## 2010-12-17 MED ORDER — LABETALOL HCL 5 MG/ML IV SOLN
INTRAVENOUS | Status: DC | PRN
  Administered 2010-12-17 (×2): 5 mg via INTRAVENOUS

## 2010-12-17 MED ORDER — NALOXONE HCL 0.4 MG/ML IJ SOLN: 0.4000 mg | INTRAMUSCULAR | Status: DC | PRN

## 2010-12-17 MED ORDER — TRAMADOL-ACETAMINOPHEN 37.5-325 MG PO TABS: 1.0000 | ORAL_TABLET | Freq: Four times a day (QID) | ORAL | Status: DC | PRN

## 2010-12-17 MED ORDER — DEXAMETHASONE SODIUM PHOSPHATE 4 MG/ML IJ SOLN
INTRAMUSCULAR | Status: DC | PRN
  Administered 2010-12-17: 10 mg via INTRAVENOUS

## 2010-12-17 MED ORDER — MENTHOL 3 MG MT LOZG: 1.0000 | LOZENGE | OROMUCOSAL | Status: DC | PRN

## 2010-12-17 MED ORDER — ONDANSETRON HCL 4 MG/2ML IJ SOLN
INTRAMUSCULAR | Status: DC | PRN
  Administered 2010-12-17 (×2): 2 mg via INTRAVENOUS

## 2010-12-17 MED ORDER — CISATRACURIUM BESYLATE 2 MG/ML IV SOLN
INTRAVENOUS | Status: DC | PRN
  Administered 2010-12-17: 10 mg via INTRAVENOUS

## 2010-12-17 MED ORDER — LACTATED RINGERS IV SOLN
INTRAVENOUS | Status: DC | PRN
  Administered 2010-12-17 (×3): via INTRAVENOUS

## 2010-12-17 MED ORDER — OXYCODONE HCL 5 MG PO TABS
5.0000 mg | ORAL_TABLET | ORAL | Status: DC | PRN
  Administered 2010-12-17: 5 mg via ORAL
  Administered 2010-12-18 – 2010-12-21 (×17): 10 mg via ORAL
  Administered 2010-12-21: 5 mg via ORAL
  Administered 2010-12-21: 10 mg via ORAL
  Filled 2010-12-17: qty 1
  Filled 2010-12-17 (×21): qty 2

## 2010-12-17 MED ORDER — DIPHENHYDRAMINE HCL 12.5 MG/5ML PO ELIX
12.5000 mg | ORAL_SOLUTION | Freq: Four times a day (QID) | ORAL | Status: DC | PRN
  Filled 2010-12-17: qty 5

## 2010-12-17 MED ORDER — RIVAROXABAN 10 MG PO TABS
10.0000 mg | ORAL_TABLET | ORAL | Status: DC
  Administered 2010-12-18 – 2010-12-21 (×4): 10 mg via ORAL
  Filled 2010-12-17 (×4): qty 1

## 2010-12-17 MED ORDER — METHOCARBAMOL 100 MG/ML IJ SOLN
500.0000 mg | Freq: Four times a day (QID) | INTRAVENOUS | Status: DC | PRN
  Administered 2010-12-17: 500 mg via INTRAVENOUS
  Filled 2010-12-17 (×2): qty 5

## 2010-12-17 MED ORDER — GLYCOPYRROLATE 0.2 MG/ML IJ SOLN
INTRAMUSCULAR | Status: DC | PRN
  Administered 2010-12-17: 0.2 mg via INTRAVENOUS

## 2010-12-17 MED ORDER — METOCLOPRAMIDE HCL 5 MG/ML IJ SOLN: 5.0000 mg | Freq: Three times a day (TID) | INTRAMUSCULAR | Status: DC | PRN

## 2010-12-17 MED ORDER — ACETAMINOPHEN 10 MG/ML IV SOLN
INTRAVENOUS | Status: AC
  Filled 2010-12-17: qty 100

## 2010-12-17 MED ORDER — POLYETHYLENE GLYCOL 3350 17 G PO PACK
17.0000 g | PACK | Freq: Every day | ORAL | Status: DC | PRN
  Filled 2010-12-17: qty 1

## 2010-12-17 MED ORDER — MAGNESIUM HYDROXIDE 400 MG/5ML PO SUSP: 30.0000 mL | Freq: Two times a day (BID) | ORAL | Status: DC | PRN

## 2010-12-17 MED ORDER — PHENOL 1.4 % MT LIQD: 1.0000 | OROMUCOSAL | Status: DC | PRN

## 2010-12-17 MED ORDER — PROMETHAZINE HCL 25 MG/ML IJ SOLN: 6.2500 mg | INTRAMUSCULAR | Status: DC | PRN

## 2010-12-17 MED ORDER — HYDROMORPHONE HCL PF 1 MG/ML IJ SOLN
INTRAMUSCULAR | Status: AC
  Filled 2010-12-17: qty 1

## 2010-12-17 MED ORDER — MIDAZOLAM HCL 2 MG/2ML IJ SOLN
1.0000 mg | INTRAMUSCULAR | Status: DC | PRN
  Administered 2010-12-17: 1 mg via INTRAVENOUS

## 2010-12-17 MED ORDER — ACETAMINOPHEN 10 MG/ML IV SOLN
1000.0000 mg | Freq: Four times a day (QID) | INTRAVENOUS | Status: AC
  Administered 2010-12-17 – 2010-12-18 (×4): 1000 mg via INTRAVENOUS
  Filled 2010-12-17 (×3): qty 100

## 2010-12-17 MED ORDER — MIDAZOLAM HCL 5 MG/5ML IJ SOLN
INTRAMUSCULAR | Status: DC | PRN
  Administered 2010-12-17 (×2): 1 mg via INTRAVENOUS

## 2010-12-17 MED ORDER — CEFAZOLIN SODIUM 1-5 GM-% IV SOLN
1.0000 g | Freq: Four times a day (QID) | INTRAVENOUS | Status: AC
  Administered 2010-12-17 – 2010-12-18 (×3): 1 g via INTRAVENOUS
  Filled 2010-12-17 (×4): qty 50

## 2010-12-17 MED ORDER — SUFENTANIL CITRATE 50 MCG/ML IV SOLN
INTRAVENOUS | Status: DC | PRN
  Administered 2010-12-17 (×3): 10 ug via INTRAVENOUS
  Administered 2010-12-17 (×3): 20 ug via INTRAVENOUS

## 2010-12-17 MED ORDER — DIPHENHYDRAMINE HCL 12.5 MG/5ML PO ELIX: 12.5000 mg | ORAL_SOLUTION | ORAL | Status: DC | PRN

## 2010-12-17 MED ORDER — BISACODYL 10 MG RE SUPP: 10.0000 mg | Freq: Every day | RECTAL | Status: DC | PRN

## 2010-12-17 MED ORDER — BISACODYL 5 MG PO TBEC: 10.0000 mg | DELAYED_RELEASE_TABLET | Freq: Every day | ORAL | Status: DC | PRN

## 2010-12-17 MED ORDER — PROPOFOL 10 MG/ML IV EMUL
INTRAVENOUS | Status: DC | PRN
  Administered 2010-12-17: 200 mg via INTRAVENOUS

## 2010-12-17 MED ORDER — HYDROMORPHONE 0.3 MG/ML IV SOLN
INTRAVENOUS | Status: DC
  Administered 2010-12-17: 1.19 mg via INTRAVENOUS
  Administered 2010-12-17: 7.5 mg via INTRAVENOUS
  Administered 2010-12-17: 1.19 mg via INTRAVENOUS
  Administered 2010-12-18: 0.2 mg via INTRAVENOUS
  Administered 2010-12-18: 0.59 mg via INTRAVENOUS
  Filled 2010-12-17 (×10): qty 25

## 2010-12-17 MED ORDER — LACTATED RINGERS IV SOLN
INTRAVENOUS | Status: DC
  Administered 2010-12-17: 1000 mL via INTRAVENOUS

## 2010-12-17 MED ORDER — LACTATED RINGERS IV SOLN: INTRAVENOUS | Status: DC

## 2010-12-17 MED ORDER — ONDANSETRON HCL 4 MG PO TABS: 4.0000 mg | ORAL_TABLET | Freq: Four times a day (QID) | ORAL | Status: DC | PRN

## 2010-12-17 MED ORDER — ACETAMINOPHEN 10 MG/ML IV SOLN
INTRAVENOUS | Status: DC | PRN
  Administered 2010-12-17: 1000 mg via INTRAVENOUS

## 2010-12-17 MED ORDER — DIPHENHYDRAMINE HCL 50 MG/ML IJ SOLN: 12.5000 mg | Freq: Four times a day (QID) | INTRAMUSCULAR | Status: DC | PRN

## 2010-12-17 MED ORDER — AMLODIPINE BESYLATE 5 MG PO TABS
5.0000 mg | ORAL_TABLET | Freq: Once | ORAL | Status: AC
  Administered 2010-12-17: 5 mg via ORAL
  Filled 2010-12-17: qty 1

## 2010-12-17 MED ORDER — DEXTROSE-NACL 5-0.9 % IV SOLN
INTRAVENOUS | Status: DC
  Administered 2010-12-17 – 2010-12-18 (×2): via INTRAVENOUS

## 2010-12-17 MED ORDER — ACETAMINOPHEN 325 MG PO TABS: 650.0000 mg | ORAL_TABLET | Freq: Four times a day (QID) | ORAL | Status: DC | PRN

## 2010-12-17 MED ORDER — AMLODIPINE BESY-BENAZEPRIL HCL 5-20 MG PO CAPS: 1.0000 | ORAL_CAPSULE | Freq: Every day | ORAL | Status: DC

## 2010-12-17 MED ORDER — SODIUM CHLORIDE 0.9 % IR SOLN
Status: DC | PRN
  Administered 2010-12-17: 3000 mL

## 2010-12-17 MED ORDER — ZOLPIDEM TARTRATE 10 MG PO TABS: 10.0000 mg | ORAL_TABLET | Freq: Every evening | ORAL | Status: DC | PRN

## 2010-12-17 MED ORDER — FLEET ENEMA 7-19 GM/118ML RE ENEM: 1.0000 | ENEMA | Freq: Every day | RECTAL | Status: DC | PRN

## 2010-12-17 MED ORDER — HYDROMORPHONE HCL PF 1 MG/ML IJ SOLN
INTRAMUSCULAR | Status: DC | PRN
  Administered 2010-12-17 (×2): 1 mg via INTRAVENOUS

## 2010-12-17 MED ORDER — LIDOCAINE HCL (CARDIAC) 20 MG/ML IV SOLN
INTRAVENOUS | Status: DC | PRN
  Administered 2010-12-17: 75 mg via INTRAVENOUS

## 2010-12-17 MED ORDER — ACETAMINOPHEN 650 MG RE SUPP: 650.0000 mg | Freq: Four times a day (QID) | RECTAL | Status: DC | PRN

## 2010-12-17 MED ORDER — FENTANYL CITRATE 0.05 MG/ML IJ SOLN
INTRAMUSCULAR | Status: DC | PRN
  Administered 2010-12-17 (×2): 50 ug via INTRAVENOUS
  Administered 2010-12-17: 100 ug via INTRAVENOUS
  Administered 2010-12-17: 50 ug via INTRAVENOUS

## 2010-12-17 SURGICAL SUPPLY — 61 items
ADAPTER BOLT FEMORAL +2/-2 (Knees) ×2 IMPLANT
BAG ZIPLOCK 12X15 (MISCELLANEOUS) ×2 IMPLANT
BANDAGE ELASTIC 6 VELCRO ST LF (GAUZE/BANDAGES/DRESSINGS) ×2 IMPLANT
BANDAGE ESMARK 6X9 LF (GAUZE/BANDAGES/DRESSINGS) ×1 IMPLANT
BLADE SAG 18X100X1.27 (BLADE) ×2 IMPLANT
BLADE SAW SGTL 11.0X1.19X90.0M (BLADE) ×2 IMPLANT
BNDG ESMARK 6X9 LF (GAUZE/BANDAGES/DRESSINGS) ×2
BONE CEMENT GENTAMICIN (Cement) ×6 IMPLANT
CATH KIT ON-Q SILVERSOAK 5IN (CATHETERS) ×2 IMPLANT
CEMENT RESTRICTOR DEPUY SZ 3 (Cement) ×2 IMPLANT
CLOTH BEACON ORANGE TIMEOUT ST (SAFETY) ×2 IMPLANT
CONT SPECI 4OZ STER CLIK (MISCELLANEOUS) ×2 IMPLANT
CUFF TOURN SGL QUICK 34 (TOURNIQUET CUFF) ×1
CUFF TRNQT CYL 34X4X40X1 (TOURNIQUET CUFF) ×1 IMPLANT
DRAPE EXTREMITY T 121X128X90 (DRAPE) ×2 IMPLANT
DRAPE POUCH INSTRU U-SHP 10X18 (DRAPES) ×2 IMPLANT
DRAPE U-SHAPE 47X51 STRL (DRAPES) ×2 IMPLANT
DRSG ADAPTIC 3X8 NADH LF (GAUZE/BANDAGES/DRESSINGS) ×2 IMPLANT
DRSG PAD ABDOMINAL 8X10 ST (GAUZE/BANDAGES/DRESSINGS) ×2 IMPLANT
DURAPREP 26ML APPLICATOR (WOUND CARE) ×2 IMPLANT
ELECT REM PT RETURN 9FT ADLT (ELECTROSURGICAL) ×2
ELECTRODE REM PT RTRN 9FT ADLT (ELECTROSURGICAL) ×1 IMPLANT
EVACUATOR 1/8 PVC DRAIN (DRAIN) ×2 IMPLANT
FACESHIELD LNG OPTICON STERILE (SAFETY) ×10 IMPLANT
FEMORAL ADAPTER (Orthopedic Implant) ×2 IMPLANT
FEMUR RT TC3 SIZE 2 (Knees) ×2 IMPLANT
GLOVE BIO SURGEON STRL SZ7.5 (GLOVE) ×2 IMPLANT
GLOVE BIO SURGEON STRL SZ8 (GLOVE) ×2 IMPLANT
GLOVE BIOGEL PI IND STRL 8 (GLOVE) ×2 IMPLANT
GLOVE BIOGEL PI INDICATOR 8 (GLOVE) ×2
GOWN PREVENTION PLUS XLARGE (GOWN DISPOSABLE) ×2 IMPLANT
GOWN STRL REIN XL XLG (GOWN DISPOSABLE) ×2 IMPLANT
HANDPIECE INTERPULSE COAX TIP (DISPOSABLE) ×1
IMMOBILIZER KNEE 20 (SOFTGOODS) ×2
IMMOBILIZER KNEE 20 THIGH 36 (SOFTGOODS) ×1 IMPLANT
INSERT TIBIAL TC3RP SZ2.0 17.5 (Insert) ×2 IMPLANT
KIT BASIN OR (CUSTOM PROCEDURE TRAY) ×2 IMPLANT
MANIFOLD NEPTUNE II (INSTRUMENTS) ×2 IMPLANT
NS IRRIG 1000ML POUR BTL (IV SOLUTION) ×2 IMPLANT
PACK TOTAL JOINT (CUSTOM PROCEDURE TRAY) ×2 IMPLANT
PAD ABD 7.5X8 STRL (GAUZE/BANDAGES/DRESSINGS) ×2 IMPLANT
PADDING CAST COTTON 6X4 STRL (CAST SUPPLIES) ×6 IMPLANT
POSITIONER SURGICAL ARM (MISCELLANEOUS) ×2 IMPLANT
SET HNDPC FAN SPRY TIP SCT (DISPOSABLE) ×1 IMPLANT
SPONGE GAUZE 4X4 12PLY (GAUZE/BANDAGES/DRESSINGS) ×2 IMPLANT
STAPLER VISISTAT 35W (STAPLE) ×2 IMPLANT
STEM TIBIA PFC 13X30MM (Stem) ×2 IMPLANT
STEM UNIVERSAL REVISION 75X14 (Stem) ×2 IMPLANT
SUCTION FRAZIER 12FR DISP (SUCTIONS) ×2 IMPLANT
SUT PDS AB 1 CT1 27 (SUTURE) ×6 IMPLANT
SUT VIC AB 2-0 CT1 27 (SUTURE) ×3
SUT VIC AB 2-0 CT1 TAPERPNT 27 (SUTURE) ×3 IMPLANT
SWAB COLLECTION DEVICE MRSA (MISCELLANEOUS) ×2 IMPLANT
TOWEL OR 17X26 10 PK STRL BLUE (TOWEL DISPOSABLE) ×4 IMPLANT
TOWER CARTRIDGE SMART MIX (DISPOSABLE) ×2 IMPLANT
TRAY FOLEY CATH 14FRSI W/METER (CATHETERS) ×2 IMPLANT
TRAY SLEEVE CEM ML (Knees) ×2 IMPLANT
TRAY TIB SZ 2 REVISION (Knees) ×2 IMPLANT
TUBE ANAEROBIC SPECIMEN COL (MISCELLANEOUS) ×2 IMPLANT
WATER STERILE IRR 1500ML POUR (IV SOLUTION) ×2 IMPLANT
WRAP KNEE MAXI GEL POST OP (GAUZE/BANDAGES/DRESSINGS) ×4 IMPLANT

## 2010-12-17 NOTE — Op Note (Signed)
NAMEBREILYN, WIRZ NO.:  [id number]  MEDICAL RECORD NO.:  [id number]  LOCATION:  [facility]                         FACILITY:  [facility]  PHYSICIAN:  Gaynelle Arabian, M.D.    DATE OF BIRTH:  1940-09-23  DATE OF PROCEDURE:  12/17/2010 DATE OF DISCHARGE:                              OPERATIVE REPORT   PREOPERATIVE DIAGNOSIS:  Unstable right total knee arthroplasty.  POSTOPERATIVE DIAGNOSIS:  Unstable right total knee arthroplasty.  PROCEDURE:  Right total knee arthroplasty revision.  SURGEON:  Gaynelle Arabian, M.D.  ASSISTANT:  Alexzandrew L. Perkins, P.A.C.  ANESTHESIA:  General.  ESTIMATED BLOOD LOSS:  Minimal.  DRAIN:  Hemovac x1.  TOURNIQUET TIME:  Up 34 minutes at 300 mmHg, down 8 minutes.  Up additional 20 minutes at 300 mmHg.  COMPLICATIONS:  None.  CONDITION:  Stable to Recovery.  BRIEF CLINICAL NOTE:  Brittania is a 70 year old female who had a right total knee arthroplasty done a few years ago.  She has had a lot of pain and instability at present.  Her components appeared to be in excellent position, but she has significant laxity as well as a patellar clunk- type syndrome.  She presents now for total knee arthroplasty revision.  PROCEDURE IN DETAIL:  After successful administration of general anesthetic, a tourniquet was placed high on the right thigh, right lower extremity prepped and draped in the usual sterile fashion.  The extremity was wrapped in Esmarch, knee flexed, tourniquet inflated to 300 mmHg.  Midline incision was made with 10 blade through subcu tissue to the level of the extensor mechanism.  A fresh blade was used to make a medial arthrotomy.  Soft tissue on the proximal medial tibia was subperiosteally elevated to the joint line with a knife and into the semimembranosus bursa with Cobb elevator.  We did not encounter any fluid in the joint.  Synovial tissue had a normal appearance without evidence of inflammation.  The patella was  subluxed laterally, knee flexed to 90 degrees.  I was easily able to sublux the tibia forward and remove the tibial polyethylene without difficulty.  The junction between the femoral component and bone was then disrupted with an osteotome and the femoral component was removed easily without any bone loss.  All excess cement was removed from the bone.  The tibia subluxed forward and retractors placed.  The junction between the tibial component and bone was then interrupted with an oscillating saw and osteotomes and tibial component easily removed.  There was apparent loosening of the tibial component as evidenced by the ease of removal.  Cements were removed from the heel area.  We then drilled into the tibial canal and thoroughly irrigated with saline.  I reamed up to 13 mm for a 13 mm stem.  Extramedullary tibial cutting guide was placed referencing proximally at the medial aspect of the tibial tubercle and distally along the second metatarsal axis and tibial crest.  Block was pinned to remove about 2 mm off the tibial bone surface.  Cut was made with an oscillating saw.  Size 2 was the most appropriate tibial component.  Proximal tibia was prepared with the modular drill and keel punch.  We then removed that and did the broaching for a 29 sleeve.  On the femoral side, we then accessed the femoral canal and thoroughly irrigated with saline.  We then reamed up to 14 mm as she had a very smallfemoral canal.  That reamer was left in place to serve as our intramedullary cutting guide.  The 5-degree right valgus alignment guide was placed and distal femoral cutting block pinned to remove about a millimeter off the distal femur.  Resection was made with an oscillating saw.  No augments were necessary.  Size 2 was the most appropriate femoral component and the size 2 AP cutting block was placed.  Rotation was marked at the epicondylar axis and confirmed by creating a rectangular flexion gap  with the spacer inserts at 90 degrees.  The block was pinned in this rotation and anterior, posterior, and chamfer cuts made.  Intercondylar block was placed at [facility] and that cut was made. The trial femur was then placed, which was a size 2 TC3 femur with a 14 x 75 stem in a +2 position.  The trials were packed with excellent stability.  A 15 mm insert was placed with excellent stability with full extension all the way down to 125 degrees of flexion with no AP or varus- valgus laxity.  The patella was everted.  There was tremendous amount of hypertrophic tissue overlying.  A patelloplasty was performed  down to the component.  The component had no wear.  It was left intact.  The tourniquet was then released, total time was 34 minutes.  It was held down for 8 minutes while the components were assembled on the back table.  Once they were assembled, we wrapped an Esmarch and tourniquet reinflated to 300 mmHg.  The trials were then removed and we trialed for the cement restrictor of the tibia, which was size 3.  A size 3 restrictor was placed at the appropriate depth of the tibial canal.  The cut bone surfaces were  then copiously irrigated with saline solution with pulsatile lavage.  The cement was mixed, used 3 batches of gentamicin impregnated cement.  Once this was ready for implantation, it was injected into the tibial canal and then the tibial component was cemented into place and all extruded cement removed.  Femoral components were also cemented into place with a press-fit stem.  The extruded cement was removed.  The trial 17.5 mm TC3 insert was placed as this had better stability throughout full range of motion.  Once the cement was fully hardened, then the permanent 17.5 mm TC3 rotating platform insert was placed in the tibial tray.  The wound was copiously irrigated with saline solution and the arthrotomy closed over Hemovac drain with interrupted #1 PDS.  Flexion against gravity was  135 degrees and the patella tracked normally.  The tourniquet was released, second tourniquet time of approximately 20 minutes.  Subcu was closed with interrupted 2-0 Vicryl and subcuticular running and skin closed with staples.  Catheter for Marcaine pain pump was placed and the pump was initiated.  The incision was cleaned and dried and Steri-Strips and a bulky sterile dressing were applied.     Gaynelle Arabian, M.D.     FA/MEDQ  D:  12/17/2010  T:  12/17/2010  Job:  [id number]

## 2010-12-17 NOTE — Transfer of Care (Signed)
Immediate Anesthesia Transfer of Care Note  Patient: [name]  Procedure(s) Performed:  TOTAL KNEE REVISION  Patient Location: PACU  Anesthesia Type: General  Level of Consciousness: awake, alert , oriented and patient cooperative  Airway & Oxygen Therapy: Patient Spontanous Breathing and Patient connected to face mask oxygen  Post-op Assessment: Report given to PACU RN, Post -op Vital signs reviewed and stable and Patient moving all extremities X 4  Post vital signs: Reviewed and stable  Complications: No apparent anesthesia complications

## 2010-12-17 NOTE — Preoperative (Signed)
Beta Blockers   Reason not to administer Beta Blockers:Not Applicable 

## 2010-12-17 NOTE — Interval H&P Note (Signed)
History and Physical Interval Note:   12/17/2010   11:44 AM   [name]  has presented today for surgery, with the diagnosis of Unstable Right Total Knee Arthroplasty  The various methods of treatment have been discussed with the patient and family. After consideration of risks, benefits and other options for treatment, the patient has consented to  Procedure(s): TOTAL KNEE REVISION as a surgical intervention .  The patients' history has been reviewed, patient examined, no change in status, stable for surgery.  I have reviewed the patients' chart and labs.  Questions were answered to the patient's satisfaction.     Gearlean Alf  MD

## 2010-12-17 NOTE — Anesthesia Preprocedure Evaluation (Signed)
Anesthesia Evaluation  Patient identified by MRN, date of birth, ID band Patient awake    Reviewed: Allergy & Precautions, H&P , NPO status , Patient's Chart, lab work & pertinent test results  History of Anesthesia Complications Negative for: history of anesthetic complications  Airway Mallampati: II TM Distance: >3 FB Neck ROM: Full    Dental No notable dental hx. (+) Teeth Intact and Dental Advisory Given   Pulmonary neg pulmonary ROS,  clear to auscultation  Pulmonary exam normal       Cardiovascular hypertension, Pt. on medications neg cardio ROS Regular Normal    Neuro/Psych Negative Neurological ROS  Negative Psych ROS   GI/Hepatic negative GI ROS, Neg liver ROS,   Endo/Other  Negative Endocrine ROSHyperthyroidism   Renal/GU negative Renal ROS  Genitourinary negative   Musculoskeletal negative musculoskeletal ROS (+)   Abdominal   Peds negative pediatric ROS (+)  Hematology negative hematology ROS (+)   Anesthesia Other Findings   Reproductive/Obstetrics negative OB ROS                           Anesthesia Physical Anesthesia Plan  ASA: II  Anesthesia Plan: General   Post-op Pain Management:    Induction: Intravenous  Airway Management Planned: Oral ETT  Additional Equipment:   Intra-op Plan:   Post-operative Plan: Extubation in OR  Informed Consent: I have reviewed the patients History and Physical, chart, labs and discussed the procedure including the risks, benefits and alternatives for the proposed anesthesia with the patient or authorized representative who has indicated his/her understanding and acceptance.   Dental advisory given  Plan Discussed with: CRNA  Anesthesia Plan Comments:         Anesthesia Quick Evaluation

## 2010-12-17 NOTE — Brief Op Note (Signed)
12/17/2010  1:25 PM  PATIENT:  [name]  71 y.o. female  PRE-OPERATIVE DIAGNOSIS:  Unstable Right Total Knee Arthroplasty  POST-OPERATIVE DIAGNOSIS:  unstable right total knee  PROCEDURE:  Procedure(s):  Right TOTAL KNEE ARTHROPLASTY REVISION  SURGEON:  Surgeon(s): Dione Plover Verlyn Lambert  PHYSICIAN ASSISTANT:   ASSISTANTS: Arlee Muslim, PA-C   ANESTHESIA:   general  EBL:  Total I/O In: 1000 [I.V.:1000] Out: 325 [Urine:250; Blood:75]  BLOOD ADMINISTERED:none  DRAINS: (medium) Hemovact drain(s) in the right knee with  Suction Open   LOCAL MEDICATIONS USED:  OTHER Marcaine pain pump  SPECIMEN:  No Specimen  DISPOSITION OF SPECIMEN:  N/A  COUNTS:  YES  TOURNIQUET:   Total Tourniquet Time Documented: Thigh (Right) - 62 minutes  DICTATION: .Other Dictation: Dictation Number [id number]  PLAN OF CARE: Admit to inpatient   PATIENT DISPOSITION:  PACU - hemodynamically stable.   Delay start of Pharmacological VTE agent (>24hrs) due to surgical blood loss or risk of bleeding:  yes

## 2010-12-17 NOTE — Anesthesia Postprocedure Evaluation (Signed)
Anesthesia Post Note  Patient: [name]  Procedure(s) Performed:  TOTAL KNEE REVISION  Anesthesia type: General  Patient location: PACU  Post pain: Pain level controlled  Post assessment: Post-op Vital signs reviewed  Last Vitals:  Filed Vitals:   12/17/10 1555  BP:   Pulse: 103  Temp:   Resp: 16    Post vital signs: Reviewed  Level of consciousness: sedated  Complications: No apparent anesthesia complications

## 2010-12-18 LAB — BASIC METABOLIC PANEL
BUN: 11 mg/dL (ref 6–23)
Creatinine, Ser: 0.84 mg/dL (ref 0.50–1.10)
GFR calc Af Amer: 81 mL/min — ABNORMAL LOW (ref >90)
GFR calc non Af Amer: 70 mL/min — ABNORMAL LOW (ref >90)
Glucose, Bld: 188 mg/dL — ABNORMAL HIGH (ref 70–99)

## 2010-12-18 LAB — CBC
HCT: 32.3 % — ABNORMAL LOW (ref 36.0–46.0)
MCH: 29.2 pg (ref 26.0–34.0)
MCHC: 33.4 g/dL (ref 30.0–36.0)
MCV: 87.3 fL (ref 78.0–100.0)
RDW: 13 % (ref 11.5–15.5)

## 2010-12-18 MED ORDER — HYDROMORPHONE HCL PF 1 MG/ML IJ SOLN
0.5000 mg | INTRAMUSCULAR | Status: DC | PRN
  Administered 2010-12-19: 12:00:00 via INTRAVENOUS
  Filled 2010-12-18: qty 1

## 2010-12-18 MED ORDER — ZOLPIDEM TARTRATE 10 MG PO TABS
10.0000 mg | ORAL_TABLET | Freq: Every evening | ORAL | Status: DC | PRN
  Administered 2010-12-18 – 2010-12-20 (×3): 10 mg via ORAL
  Filled 2010-12-18 (×3): qty 1

## 2010-12-18 NOTE — Progress Notes (Signed)
Subjective: 1 Day Post-Op Procedure(s) (LRB): TOTAL KNEE REVISION (Right) Patient reports pain as mild.   Patient seen in rounds with Dr. Wynelle Link. Patient has complaints of some pain.  We will start therapy today. Plan is to go home after hospital stay. Husband in room  Objective: Vital signs in last 24 hours: Temp:  [97.8 F (36.6 C)-99.7 F (37.6 C)] 98.6 F (37 C) (11/22 0540) Pulse Rate:  [85-110] 86  (11/22 0540) Resp:  [8-22] 16  (11/22 0540) BP: (108-196)/(67-99) 127/72 mmHg (11/22 0540) SpO2:  [96 %-100 %] 100 % (11/22 0540) Weight:  [70.308 kg (155 lb)] 155 lb (70.308 kg) (11/21 1003)  Intake/Output from previous day:  Intake/Output Summary (Last 24 hours) at 12/18/10 0708 Last data filed at 12/18/10 0618  Gross per 24 hour  Intake 4138.5 ml  Output   2540 ml  Net 1598.5 ml    Intake/Output this shift:    Labs: Results for orders placed during the hospital encounter of 12/17/10  TYPE AND SCREEN      Component Value Range   ABO/RH(D) O POS     Antibody Screen NEG     Sample Expiration 12/20/2010    CBC      Component Value Range   WBC 14.4 (*) 4.0 - 10.5 (K/uL)   RBC 3.70 (*) 3.87 - 5.11 (MIL/uL)   Hemoglobin 10.8 (*) 12.0 - 15.0 (g/dL)   HCT 32.3 (*) 36.0 - 46.0 (%)   MCV 87.3  78.0 - 100.0 (fL)   MCH 29.2  26.0 - 34.0 (pg)   MCHC 33.4  30.0 - 36.0 (g/dL)   RDW 13.0  11.5 - 15.5 (%)   Platelets 216  150 - 400 (K/uL)  BASIC METABOLIC PANEL      Component Value Range   Sodium 137  135 - 145 (mEq/L)   Potassium 4.8  3.5 - 5.1 (mEq/L)   Chloride 100  96 - 112 (mEq/L)   CO2 27  19 - 32 (mEq/L)   Glucose, Bld 188 (*) 70 - 99 (mg/dL)   BUN 11  6 - 23 (mg/dL)   Creatinine, Ser 0.84  0.50 - 1.10 (mg/dL)   Calcium 9.6  8.4 - 10.5 (mg/dL)   GFR calc non Af Amer 70 (*) >90 (mL/min)   GFR calc Af Amer 81 (*) >90 (mL/min)    Exam - Neurovascular intact Sensation intact distally Dressing - clean, dry, no drainage Motor function intact - moving foot and  toes well on exam.  Hemovac pulled without difficulty.  Assessment/Plan: 1 Day Post-Op Procedure(s) (LRB): TOTAL KNEE REVISION (Right)  Past Medical History  Diagnosis Date  . Hypertension   . Hyperthyroidism     nodule on thyroid, no meds.  . Arthritis     knees    Advance diet Up with therapy Discharge home with home health when met goals  DVT Prophylaxis - Xarelto  Protocol Weight-Bearing as tolerated to right leg Keep foley until tomorrow. No vaccines. DC PCA Plan for home.  PERKINS, ALEXZANDREW 12/18/2010, 7:08 AM

## 2010-12-18 NOTE — Progress Notes (Signed)
Physical Therapy Evaluation Patient Details Name: [name] MRN: [id number] DOB: Mar 24, 1940 Today's Date: 12/18/2010 1235 - 1315 EVAL, TE Problem List:  Patient Active Problem List  Diagnoses  . Diverticulitis of sigmoid colon    Past Medical History:  Past Medical History  Diagnosis Date  . Hypertension   . Hyperthyroidism     nodule on thyroid, no meds.  . Arthritis     knees   Past Surgical History:  Past Surgical History  Procedure Date  . Joint replacement 2009    total knee replacement  . Bladder surgery     bladder suspension  . Colon surgery 3 years ago    colonoscopy  . Abdominal hysterectomy 1977    PT Assessment/Plan/Recommendation PT Assessment Clinical Impression Statement: Pt with R TKR revision 12/17/10 presents with decreased strength and ROM at R knee as well as decreased functional mobility.  Pt will benefit from skilled PT intervention to maximize Ind for d/c home with family. PT Recommendation/Assessment: Patient will need skilled PT in the acute care venue PT Problem List: Decreased strength;Decreased range of motion;Decreased activity tolerance;Decreased mobility;Decreased knowledge of use of DME;Pain PT Plan PT Frequency: 7X/week PT Treatment/Interventions: DME instruction;Gait training;Stair training;Functional mobility training;Therapeutic exercise;Patient/family education PT Recommendation Recommendations for Other Services: OT consult Follow Up Recommendations: Home health PT Equipment Recommended: None recommended by PT PT Goals  Acute Rehab PT Goals PT Goal Formulation: With patient Time For Goal Achievement: 7 days Pt will go Supine/Side to Sit: with supervision PT Goal: Supine/Side to Sit - Progress: Not met Pt will go Sit to Supine/Side: with supervision PT Goal: Sit to Supine/Side - Progress: Not met Pt will Transfer Sit to Stand/Stand to Sit: with supervision PT Transfer Goal: Sit to Stand/Stand to Sit - Progress: Not  met Pt will Ambulate: 51 - 150 feet PT Goal: Ambulate - Progress: Not met Pt will Go Up / Down Stairs: 3-5 stairs;with min assist;with least restrictive assistive device PT Goal: Up/Down Stairs - Progress: Not met  PT Evaluation Precautions/Restrictions  Precautions Precautions: Knee Required Braces or Orthoses: Yes Knee Immobilizer: Discontinue once straight leg raise with < 10 degree lag Restrictions Weight Bearing Restrictions: No Prior Functioning  Home Living Lives With: Spouse Receives Help From: Family Type of Home: House Home Layout: One level Home Access: Stairs to enter Entrance Stairs-Rails: None Entrance Stairs-Number of Steps: 3 Prior Function Level of Independence: Independent with basic ADLs;Independent with homemaking with ambulation;Independent with gait;Independent with transfers Able to Take Stairs?: Yes Cognition Cognition Arousal/Alertness: Awake/alert Overall Cognitive Status: Appears within functional limits for tasks assessed Orientation Level: Oriented X4 Sensation/Coordination Coordination Gross Motor Movements are Fluid and Coordinated: Yes Extremity Assessment RUE Assessment RUE Assessment: Within Functional Limits LUE Assessment LUE Assessment: Within Functional Limits RLE Assessment RLE Assessment: Exceptions to [facility] RLE PROM (degrees) Overall PROM Right Lower Extremity:  (-10 - 40) RLE Strength RLE Overall Strength:  (3/5 quads - Ind SLR performed) LLE Assessment LLE Assessment: Within Functional Limits Mobility (including Balance) Bed Mobility Bed Mobility: Yes Supine to Sit: 4: Min assist Transfers Transfers: Yes Sit to Stand: 4: Min assist;From bed Sit to Stand Details (indicate cue type and reason): cues for LE position and use of UEs, LE position Stand to Sit: 4: Min assist;With armrests;To chair/3-in-1 Stand to Sit Details: cues for LE position and use of UEs, LE position Ambulation/Gait Ambulation/Gait: Yes Ambulation/Gait  Assistance: 4: Min assist Ambulation/Gait Assistance Details (indicate cue type and reason): cues for sequence, posture and position from  RW Ambulation Distance (Feet): 56 Feet Assistive device: Rolling walker Gait Pattern: Step-to pattern    Exercise  Total Joint Exercises Ankle Circles/Pumps: AROM;10 reps;Supine;Both Quad Sets: AROM;10 reps;Both;Supine Heel Slides: AAROM;Right;10 reps;Supine Straight Leg Raises: AROM;10 reps;Supine;Right End of Session PT - End of Session Activity Tolerance: Patient tolerated treatment well Patient left: in chair;with call bell in reach Nurse Communication: Mobility status for transfers;Mobility status for ambulation General Behavior During Session: [facility] for tasks performed Cognition: [facility] for tasks performed  [name] 12/18/2010, 1:39 PM

## 2010-12-19 LAB — BASIC METABOLIC PANEL
CO2: 28 mEq/L (ref 19–32)
Calcium: 8.7 mg/dL (ref 8.4–10.5)
Creatinine, Ser: 1.07 mg/dL (ref 0.50–1.10)

## 2010-12-19 LAB — CBC
HCT: 27.6 % — ABNORMAL LOW (ref 36.0–46.0)
Hemoglobin: 9.3 g/dL — ABNORMAL LOW (ref 12.0–15.0)
MCHC: 33.7 g/dL (ref 30.0–36.0)
Platelets: 159 10*3/uL (ref 150–400)
RBC: 3.16 MIL/uL — ABNORMAL LOW (ref 3.87–5.11)
RDW: 13.3 % (ref 11.5–15.5)
WBC: 11.2 10*3/uL — ABNORMAL HIGH (ref 4.0–10.5)

## 2010-12-19 MED ORDER — OXYCODONE HCL 5 MG PO TABS: 5.0000 mg | ORAL_TABLET | ORAL | Status: AC | PRN

## 2010-12-19 MED ORDER — RIVAROXABAN 10 MG PO TABS: 10.0000 mg | ORAL_TABLET | ORAL | Status: DC

## 2010-12-19 MED ORDER — METHOCARBAMOL 500 MG PO TABS: 500.0000 mg | ORAL_TABLET | Freq: Four times a day (QID) | ORAL | Status: AC | PRN

## 2010-12-19 NOTE — Progress Notes (Signed)
Physical Therapy Treatment Patient Details Name: [name] MRN: [id number] DOB: [birth year] Today's Date: 12/19/2010 [id number] PT Assessment/Plan  PT - Assessment/Plan Comments on Treatment Session: Pt ltd by pain - OOB deferred pending more Pain MEDS PT Plan: Discharge plan remains appropriate PT Frequency: 7X/week Follow Up Recommendations: Home health PT PT Goals     PT Treatment Precautions/Restrictions  Precautions Precautions: Knee Required Braces or Orthoses: Yes Knee Immobilizer: Discontinue once straight leg raise with < 10 degree lag Restrictions Weight Bearing Restrictions: No Mobility (including Balance)      Exercise  Total Joint Exercises Ankle Circles/Pumps: AROM;Both;15 reps;Supine Quad Sets: AROM;Both;15 reps;Supine Heel Slides: AAROM;15 reps;Supine Straight Leg Raises: AAROM;15 reps;Supine End of Session PT - End of Session Activity Tolerance: Patient limited by pain Patient left: in bed Nurse Communication:  (Pt pain level - meds requested) General Behavior During Session: [facility] for tasks performed Cognition: [facility] for tasks performed  [name] 12/19/2010, 1:27 PM

## 2010-12-19 NOTE — Progress Notes (Signed)
Occupational Therapy Note Chart reviewed. Spoke with patient and she states she has all OT DME from her last knee surgery and she does not feel she needs OT. Husband and other family available to assist at discharge. Will sign off for OT. Pauline Aus OTR/L [id number]

## 2010-12-19 NOTE — Progress Notes (Signed)
Subjective: 2 Days Post-Op Procedure(s) (LRB): TOTAL KNEE REVISION (Right) Patient reports pain as mild.   Patient seen in rounds with Dr. Wynelle Link. Patient has no complaints. Eating well and did well with PT yesterday   Objective: Vital signs in last 24 hours: Temp:  [97.9 F (36.6 C)-98.9 F (37.2 C)] 98.9 F (37.2 C) (11/23 0605) Pulse Rate:  [73-100] 100  (11/23 0605) Resp:  [12-16] 16  (11/23 0605) BP: (122-135)/(68-79) 135/73 mmHg (11/23 0605) SpO2:  [96 %-100 %] 96 % (11/23 0605)  Intake/Output from previous day:  Intake/Output Summary (Last 24 hours) at 12/19/10 0750 Last data filed at 12/19/10 0605  Gross per 24 hour  Intake   2255 ml  Output   3900 ml  Net  -1645 ml    Intake/Output this shift:    Labs: Results for orders placed during the hospital encounter of 12/17/10  TYPE AND SCREEN      Component Value Range   ABO/RH(D) O POS     Antibody Screen NEG     Sample Expiration 12/20/2010    CBC      Component Value Range   WBC 14.4 (*) 4.0 - 10.5 (K/uL)   RBC 3.70 (*) 3.87 - 5.11 (MIL/uL)   Hemoglobin 10.8 (*) 12.0 - 15.0 (g/dL)   HCT 32.3 (*) 36.0 - 46.0 (%)   MCV 87.3  78.0 - 100.0 (fL)   MCH 29.2  26.0 - 34.0 (pg)   MCHC 33.4  30.0 - 36.0 (g/dL)   RDW 13.0  11.5 - 15.5 (%)   Platelets 216  150 - 400 (K/uL)  BASIC METABOLIC PANEL      Component Value Range   Sodium 137  135 - 145 (mEq/L)   Potassium 4.8  3.5 - 5.1 (mEq/L)   Chloride 100  96 - 112 (mEq/L)   CO2 27  19 - 32 (mEq/L)   Glucose, Bld 188 (*) 70 - 99 (mg/dL)   BUN 11  6 - 23 (mg/dL)   Creatinine, Ser 0.84  0.50 - 1.10 (mg/dL)   Calcium 9.6  8.4 - 10.5 (mg/dL)   GFR calc non Af Amer 70 (*) >90 (mL/min)   GFR calc Af Amer 81 (*) >90 (mL/min)  CBC      Component Value Range   WBC 11.2 (*) 4.0 - 10.5 (K/uL)   RBC 3.16 (*) 3.87 - 5.11 (MIL/uL)   Hemoglobin 9.3 (*) 12.0 - 15.0 (g/dL)   HCT 27.6 (*) 36.0 - 46.0 (%)   MCV 87.3  78.0 - 100.0 (fL)   MCH 29.4  26.0 - 34.0 (pg)   MCHC 33.7   30.0 - 36.0 (g/dL)   RDW 13.3  11.5 - 15.5 (%)   Platelets 159  150 - 400 (K/uL)  BASIC METABOLIC PANEL      Component Value Range   Sodium 140  135 - 145 (mEq/L)   Potassium 4.2  3.5 - 5.1 (mEq/L)   Chloride 105  96 - 112 (mEq/L)   CO2 28  19 - 32 (mEq/L)   Glucose, Bld 132 (*) 70 - 99 (mg/dL)   BUN 13  6 - 23 (mg/dL)   Creatinine, Ser 1.07  0.50 - 1.10 (mg/dL)   Calcium 8.7  8.4 - 10.5 (mg/dL)   GFR calc non Af Amer 52 (*) >90 (mL/min)   GFR calc Af Amer 60 (*) >90 (mL/min)    Exam - Neurologically intact Neurovascular intact Compartment soft Dressing/Incision - clean, dry, no drainage Motor  function intact - moving foot and toes well on exam.   Assessment/Plan: 2 Days Post-Op Procedure(s) (LRB): TOTAL KNEE REVISION (Right)  Past Medical History  Diagnosis Date  . Hypertension   . Hyperthyroidism     nodule on thyroid, no meds.  . Arthritis     knees    Up with therapy D/C IV fluids Plan for discharge tomorrow Discharge home with home health  DVT Prophylaxis - Xarelto Protocol Weight-Bearing as tolerated to right leg  Xiomara Sevillano V 12/19/2010, 7:50 AM

## 2010-12-19 NOTE — Progress Notes (Signed)
Physical Therapy Treatment Patient Details Name: [name] MRN: [id number] DOB: 1940/10/18 Today's Date: 12/19/2010 1238 - 33  PT Assessment/Plan  PT - Assessment/Plan Comments on Treatment Session: Pt ltd by pain - OOB deferred pending more Pain MEDS PT Plan: Discharge plan remains appropriate PT Frequency: 7X/week Follow Up Recommendations: Home health PT PT Goals  Acute Rehab PT Goals PT Goal: Supine/Side to Sit - Progress: Not met PT Goal: Sit to Supine/Side - Progress: Not met PT Transfer Goal: Sit to Stand/Stand to Sit - Progress: Not met PT Goal: Ambulate - Progress: Not met PT Goal: Up/Down Stairs - Progress: Not met  PT Treatment Precautions/Restrictions  Precautions Precautions: Knee Required Braces or Orthoses: Yes Knee Immobilizer: Discontinue once straight leg raise with < 10 degree lag Restrictions Weight Bearing Restrictions: No Mobility (including Balance) Bed Mobility Bed Mobility: Yes Supine to Sit: 4: Min assist;3: Mod assist Sit to Supine - Right: 4: Min assist;3: Mod assist Transfers Sit to Stand: 4: Min assist;With upper extremity assist;From bed;With armrests;From chair/3-in-1 Stand to Sit: 4: Min assist;To bed;To chair/3-in-1;With armrests;Without upper extremity assist;With upper extremity assist Ambulation/Gait Ambulation/Gait Assistance: 4: Min assist;3: Mod assist Ambulation/Gait Assistance Details (indicate cue type and reason): cues for sequence and position from RW Ambulation Distance (Feet): 25 Feet Assistive device: Rolling walker Gait Pattern: Step-to pattern    Exercise  Total Joint Exercises Ankle Circles/Pumps: AROM;Both;15 reps;Supine Quad Sets: AROM;Both;15 reps;Supine Heel Slides: AAROM;15 reps;Supine Straight Leg Raises: AAROM;15 reps;Supine End of Session PT - End of Session Activity Tolerance: Patient limited by pain Patient left: in bed;with call bell in reach;with family/visitor present Nurse Communication:  (Pt  pain level) General Behavior During Session: [facility] for tasks performed Cognition: [facility] for tasks performed  [name] 12/19/2010, 1:36 PM

## 2010-12-19 NOTE — Progress Notes (Signed)
CM consult done. See CM notes in shadow chart.  Sherrin Daisy [facility] RN BSN CCM [phone] 12/19/2010

## 2010-12-20 LAB — CBC
MCV: 86.7 fL (ref 78.0–100.0)
Platelets: 173 10*3/uL (ref 150–400)
RDW: 13.4 % (ref 11.5–15.5)
WBC: 11.3 10*3/uL — ABNORMAL HIGH (ref 4.0–10.5)

## 2010-12-20 NOTE — Progress Notes (Signed)
[name], female   DOB: November 23, 1940, 70 y.o.   MRN: [id number] Subjective: 3 Days Post-Op Procedure(s) (LRB): TOTAL KNEE REVISION (Right) Patient reports pain as 6 on 0-10 scale.    Patient denies any chest pain or SOB.  She is continuing to have some issues with pain control.  Patient does not feel ready for discharge.  Objective: Vital signs in last 24 hours: Temp:  [99.6 F (37.6 C)-100.1 F (37.8 C)] 100.1 F (37.8 C) (11/24 0449) Pulse Rate:  [106-116] 110  (11/24 0449) Resp:  [14-16] 14  (11/24 0449) BP: (130-138)/(74-77) 138/77 mmHg (11/24 0449) SpO2:  [95 %-100 %] 95 % (11/24 0449)  Intake/Output from previous day:  Intake/Output Summary (Last 24 hours) at 12/20/10 0819 Last data filed at 12/19/10 1625  Gross per 24 hour  Intake      0 ml  Output    550 ml  Net   -550 ml    Intake/Output this shift:    Labs: Results for orders placed during the hospital encounter of 12/17/10  TYPE AND SCREEN      Component Value Range   ABO/RH(D) O POS     Antibody Screen NEG     Sample Expiration 12/20/2010    CBC      Component Value Range   WBC 14.4 (*) 4.0 - 10.5 (K/uL)   RBC 3.70 (*) 3.87 - 5.11 (MIL/uL)   Hemoglobin 10.8 (*) 12.0 - 15.0 (g/dL)   HCT 32.3 (*) 36.0 - 46.0 (%)   MCV 87.3  78.0 - 100.0 (fL)   MCH 29.2  26.0 - 34.0 (pg)   MCHC 33.4  30.0 - 36.0 (g/dL)   RDW 13.0  11.5 - 15.5 (%)   Platelets 216  150 - 400 (K/uL)  BASIC METABOLIC PANEL      Component Value Range   Sodium 137  135 - 145 (mEq/L)   Potassium 4.8  3.5 - 5.1 (mEq/L)   Chloride 100  96 - 112 (mEq/L)   CO2 27  19 - 32 (mEq/L)   Glucose, Bld 188 (*) 70 - 99 (mg/dL)   BUN 11  6 - 23 (mg/dL)   Creatinine, Ser 0.84  0.50 - 1.10 (mg/dL)   Calcium 9.6  8.4 - 10.5 (mg/dL)   GFR calc non Af Amer 70 (*) >90 (mL/min)   GFR calc Af Amer 81 (*) >90 (mL/min)  CBC      Component Value Range   WBC 11.2 (*) 4.0 - 10.5 (K/uL)   RBC 3.16 (*) 3.87 - 5.11 (MIL/uL)   Hemoglobin 9.3 (*)  12.0 - 15.0 (g/dL)   HCT 27.6 (*) 36.0 - 46.0 (%)   MCV 87.3  78.0 - 100.0 (fL)   MCH 29.4  26.0 - 34.0 (pg)   MCHC 33.7  30.0 - 36.0 (g/dL)   RDW 13.3  11.5 - 15.5 (%)   Platelets 159  150 - 400 (K/uL)  BASIC METABOLIC PANEL      Component Value Range   Sodium 140  135 - 145 (mEq/L)   Potassium 4.2  3.5 - 5.1 (mEq/L)   Chloride 105  96 - 112 (mEq/L)   CO2 28  19 - 32 (mEq/L)   Glucose, Bld 132 (*) 70 - 99 (mg/dL)   BUN 13  6 - 23 (mg/dL)   Creatinine, Ser 1.07  0.50 - 1.10 (mg/dL)   Calcium 8.7  8.4 - 10.5 (mg/dL)   GFR calc non Af Amer 52 (*) >  90 (mL/min)   GFR calc Af Amer 60 (*) >90 (mL/min)  CBC      Component Value Range   WBC 11.3 (*) 4.0 - 10.5 (K/uL)   RBC 3.15 (*) 3.87 - 5.11 (MIL/uL)   Hemoglobin 9.3 (*) 12.0 - 15.0 (g/dL)   HCT 27.3 (*) 36.0 - 46.0 (%)   MCV 86.7  78.0 - 100.0 (fL)   MCH 29.5  26.0 - 34.0 (pg)   MCHC 34.1  30.0 - 36.0 (g/dL)   RDW 13.4  11.5 - 15.5 (%)   Platelets 173  150 - 400 (K/uL)    Exam - Neurologically intact Neurovascular intact Sensation intact distally Dorsiflexion/Plantar flexion intact Compartment soft Dressing/Incision - clean, dry, no drainage Motor function intact - moving foot and toes well on exam.   Assessment/Plan: 3 Days Post-Op Procedure(s) (LRB): TOTAL KNEE REVISION (Right)  Up with therapy Past Medical History  Diagnosis Date  . Hypertension   . Hyperthyroidism     nodule on thyroid, no meds.  . Arthritis     knees    DVT Prophylaxis - Xarelto Weight-Bearing as tolerated to right leg Patient is tachycardic today so will monitor, try and maximize pain control. Febrile- IS q 1hr WA Cont PT Jacaden Forbush R. 12/20/2010, 8:19 AM

## 2010-12-20 NOTE — Progress Notes (Signed)
Cm spoke with pt concerning [place] needs. Pt has DME.  [facility] to provide HHPT. Spouse and adult children to assist in care.

## 2010-12-20 NOTE — Progress Notes (Signed)
Physical Therapy Treatment Patient Details Name: [name] MRN: [id number] DOB: [birth year] Today's Date: 12/20/2010 1451 - 32  2GT PT Assessment/Plan  PT - Assessment/Plan Comments on Treatment Session: Deccreased pain with steady pain MEDS but increased fatigue - progress continues slow PT Plan: Discharge plan remains appropriate PT Frequency: 7X/week Follow Up Recommendations: Home health PT Equipment Recommended: None recommended by PT PT Goals  Acute Rehab PT Goals PT Goal: Supine/Side to Sit - Progress: Progressing toward goal PT Goal: Sit to Supine/Side - Progress: Progressing toward goal PT Transfer Goal: Sit to Stand/Stand to Sit - Progress: Progressing toward goal PT Goal: Ambulate - Progress: Progressing toward goal  PT Treatment Precautions/Restrictions  Precautions Precautions: Knee Required Braces or Orthoses: Yes Knee Immobilizer: Discontinue once straight leg raise with < 10 degree lag Restrictions Weight Bearing Restrictions: Yes RLE Weight Bearing: Weight bearing as tolerated Mobility (including Balance) Bed Mobility Supine to Sit: 4: Min assist Supine to Sit Details (indicate cue type and reason): assist with R LE Sit to Supine - Right: 4: Min assist Sit to Supine - Right Details (indicate cue type and reason): assist with R LE Transfers Sit to Stand: 5: Supervision;4: Min assist;From bed;With upper extremity assist Sit to Stand Details (indicate cue type and reason): cues for use of UEs Stand to Sit: 5: Supervision;4: Min assist;To bed;With upper extremity assist Stand to Sit Details: cues for LE position Ambulation/Gait Ambulation/Gait Assistance: 4: Min assist Ambulation/Gait Assistance Details (indicate cue type and reason): cues for sequence, stride length and position from RW Ambulation Distance (Feet): 36 Feet Assistive device: Rolling walker Gait Pattern: Step-to pattern, slow deliberate pace with frequent standing rest breaks      Exercise  Total Joint Exercises Ankle Circles/Pumps: AROM;Both;15 reps;Supine Quad Sets: AROM;Both;15 reps;Supine Heel Slides: AAROM;20 reps;Supine;Right Straight Leg Raises: AAROM;20 reps;Supine;Right End of Session PT - End of Session Activity Tolerance: Patient limited by fatigue;Other (comment) (Pt c/o intermittant lightheadedness) Patient left: in bed;with call bell in reach;with family/visitor present General Behavior During Session: [name] for tasks performed Cognition: [name] for tasks performed  [name] 12/20/2010, 3:15 PM

## 2010-12-20 NOTE — Progress Notes (Signed)
Physical Therapy Treatment Patient Details Name: [name] MRN: [id number] DOB: May 07, 1940 Today's Date: 12/20/2010 1153 - 63  TE PT Assessment/Plan  PT - Assessment/Plan PT Plan: Discharge plan remains appropriate PT Frequency: 7X/week Follow Up Recommendations: Home health PT Equipment Recommended: None recommended by PT PT Goals  Acute Rehab PT Goals PT Goal: Supine/Side to Sit - Progress: Not met PT Goal: Sit to Supine/Side - Progress: Not met PT Transfer Goal: Sit to Stand/Stand to Sit - Progress: Not met PT Goal: Ambulate - Progress: Not met PT Goal: Up/Down Stairs - Progress: Not met  PT Treatment Precautions/Restrictions  Precautions Precautions: Knee Required Braces or Orthoses: Yes Knee Immobilizer: Discontinue once straight leg raise with < 10 degree lag Restrictions Weight Bearing Restrictions: Yes RLE Weight Bearing: Weight bearing as tolerated   Exercise  Total Joint Exercises Ankle Circles/Pumps: AROM;Both;15 reps;Supine Quad Sets: AROM;Both;15 reps;Supine Heel Slides: AAROM;20 reps;Supine;Right Straight Leg Raises: AAROM;20 reps;Supine;Right End of Session PT - End of Session Activity Tolerance: Patient limited by pain Patient left: in bed;with call bell in reach;with family/visitor present Nurse Communication:  (Pt pain level) General Behavior During Session: [facility] for tasks performed Cognition: [facility] for tasks performed  [name] 12/20/2010, 1:35 PM

## 2010-12-20 NOTE — Progress Notes (Signed)
Physical Therapy Treatment Patient Details Name: [name] MRN: [id number] DOB: [birth year] Today's Date: 12/20/2010 [id number] - 0935  GT PT Assessment/Plan  PT - Assessment/Plan PT Plan: Discharge plan remains appropriate PT Frequency: 7X/week Follow Up Recommendations: Home health PT Equipment Recommended: None recommended by PT PT Goals  Acute Rehab PT Goals PT Goal: Supine/Side to Sit - Progress: Not met PT Goal: Sit to Supine/Side - Progress: Not met PT Transfer Goal: Sit to Stand/Stand to Sit - Progress: Not met PT Goal: Ambulate - Progress: Not met PT Goal: Up/Down Stairs - Progress: Not met  PT Treatment Precautions/Restrictions  Precautions Precautions: Knee Required Braces or Orthoses: Yes Knee Immobilizer: Discontinue once straight leg raise with < 10 degree lag Restrictions Weight Bearing Restrictions: Yes RLE Weight Bearing: Weight bearing as tolerated Mobility (including Balance) Bed Mobility Supine to Sit: 4: Min assist Supine to Sit Details (indicate cue type and reason): assist with R LE Transfers Sit to Stand: 4: Min assist;From bed;With upper extremity assist Sit to Stand Details (indicate cue type and reason): cues for use of UEs Stand to Sit: 4: Min assist;With upper extremity assist;To chair/3-in-1;With armrests Stand to Sit Details: LE position Ambulation/Gait Ambulation/Gait Assistance: 4: Min assist Ambulation/Gait Assistance Details (indicate cue type and reason): cues for position from RW Ambulation Distance (Feet): 8 Feet Assistive device: Rolling walker Gait Pattern: Step-to pattern    Exercise    End of Session PT - End of Session Activity Tolerance: Patient limited by pain Patient left: in chair;with call bell in reach;with family/visitor present Nurse Communication:  (Pt pain level) General Behavior During Session: [facility] for tasks performed Cognition: [facility] for tasks performed  [name] 12/20/2010, 1:29 PM

## 2010-12-21 MED ORDER — CHLORHEXIDINE GLUCONATE 4 % EX LIQD
60.0000 mL | Freq: Once | CUTANEOUS | Status: DC
  Filled 2010-12-21: qty 60

## 2010-12-21 NOTE — Discharge Summary (Signed)
Physician Discharge Summary   [name] MRN: [id number] DOB/AGE: [birth year] 70 y.o.  Admit date: 12/17/2010 Discharge date: 12/21/2010  Primary Diagnosis: Painful total knee replacement   Admission Diagnoses: Past Medical History  Diagnosis Date  . Hypertension   . Hyperthyroidism     nodule on thyroid, no meds.  . Arthritis     knees    Discharge Diagnoses:  Active Problems:  * No active hospital problems. *  Postop ABLA   Procedure: Procedure(s) (LRB): TOTAL KNEE REVISION (Right)   Consults: none  HPI: [name] is a 70 year old female who had a right total knee arthroplasty done a few years ago. She has had a lot of pain  and instability at present. Her components appeared to be in excellent  position, but she has significant laxity as well as a patellar clunk-  type syndrome. She presents now for total knee arthroplasty revision.   Laboratory Data: Results for orders placed during the hospital encounter of 12/17/10  TYPE AND SCREEN      Component Value Range   ABO/RH(D) O POS     Antibody Screen NEG     Sample Expiration 12/20/2010    CBC      Component Value Range   WBC 14.4 (*) 4.0 - 10.5 (K/uL)   RBC 3.70 (*) 3.87 - 5.11 (MIL/uL)   Hemoglobin 10.8 (*) 12.0 - 15.0 (g/dL)   HCT 32.3 (*) 36.0 - 46.0 (%)   MCV 87.3  78.0 - 100.0 (fL)   MCH 29.2  26.0 - 34.0 (pg)   MCHC 33.4  30.0 - 36.0 (g/dL)   RDW 13.0  11.5 - 15.5 (%)   Platelets 216  150 - 400 (K/uL)  BASIC METABOLIC PANEL      Component Value Range   Sodium 137  135 - 145 (mEq/L)   Potassium 4.8  3.5 - 5.1 (mEq/L)   Chloride 100  96 - 112 (mEq/L)   CO2 27  19 - 32 (mEq/L)   Glucose, Bld 188 (*) 70 - 99 (mg/dL)   BUN 11  6 - 23 (mg/dL)   Creatinine, Ser 0.84  0.50 - 1.10 (mg/dL)   Calcium 9.6  8.4 - 10.5 (mg/dL)   GFR calc non Af Amer 70 (*) >90 (mL/min)   GFR calc Af Amer 81 (*) >90 (mL/min)  CBC      Component Value Range   WBC 11.2 (*) 4.0 - 10.5 (K/uL)   RBC 3.16 (*)  3.87 - 5.11 (MIL/uL)   Hemoglobin 9.3 (*) 12.0 - 15.0 (g/dL)   HCT 27.6 (*) 36.0 - 46.0 (%)   MCV 87.3  78.0 - 100.0 (fL)   MCH 29.4  26.0 - 34.0 (pg)   MCHC 33.7  30.0 - 36.0 (g/dL)   RDW 13.3  11.5 - 15.5 (%)   Platelets 159  150 - 400 (K/uL)  BASIC METABOLIC PANEL      Component Value Range   Sodium 140  135 - 145 (mEq/L)   Potassium 4.2  3.5 - 5.1 (mEq/L)   Chloride 105  96 - 112 (mEq/L)   CO2 28  19 - 32 (mEq/L)   Glucose, Bld 132 (*) 70 - 99 (mg/dL)   BUN 13  6 - 23 (mg/dL)   Creatinine, Ser 1.07  0.50 - 1.10 (mg/dL)   Calcium 8.7  8.4 - 10.5 (mg/dL)   GFR calc non Af Amer 52 (*) >90 (mL/min)   GFR calc Af Amer 60 (*) >90 (  mL/min)  CBC      Component Value Range   WBC 11.3 (*) 4.0 - 10.5 (K/uL)   RBC 3.15 (*) 3.87 - 5.11 (MIL/uL)   Hemoglobin 9.3 (*) 12.0 - 15.0 (g/dL)   HCT 27.3 (*) 36.0 - 46.0 (%)   MCV 86.7  78.0 - 100.0 (fL)   MCH 29.5  26.0 - 34.0 (pg)   MCHC 34.1  30.0 - 36.0 (g/dL)   RDW 13.4  11.5 - 15.5 (%)   Platelets 173  150 - 400 (K/uL)    Appointment on 12/08/2010  Component Date Value Range Status  . aPTT (seconds) 12/08/2010 25  24-37 Final  . WBC (K/uL) 12/08/2010 6.8  4.0-10.5 Final  . RBC (MIL/uL) 12/08/2010 4.74  3.87-5.11 Final  . Hemoglobin (g/dL) 12/08/2010 13.8  12.0-15.0 Final  . HCT (%) 12/08/2010 40.9  36.0-46.0 Final  . MCV (fL) 12/08/2010 86.3  78.0-100.0 Final  . MCH (pg) 12/08/2010 29.1  26.0-34.0 Final  . MCHC (g/dL) 12/08/2010 33.7  30.0-36.0 Final  . RDW (%) 12/08/2010 13.0  11.5-15.5 Final  . Platelets (K/uL) 12/08/2010 184  150-400 Final  . Sodium (mEq/L) 12/08/2010 139  135-145 Final  . Potassium (mEq/L) 12/08/2010 4.8  3.5-5.1 Final   MODERATE HEMOLYSIS  . Chloride (mEq/L) 12/08/2010 102  96-112 Final  . CO2 (mEq/L) 12/08/2010 28  19-32 Final  . Glucose, Bld (mg/dL) 12/08/2010 104* 70-99 Final  . BUN (mg/dL) 12/08/2010 13  6-23 Final  . Creatinine, Ser (mg/dL) 12/08/2010 0.85  0.50-1.10 Final  . Calcium (mg/dL) 12/08/2010  10.5  8.4-10.5 Final  . Total Protein (g/dL) 12/08/2010 7.0  6.0-8.3 Final  . Albumin (g/dL) 12/08/2010 3.7  3.5-5.2 Final  . AST (U/L) 12/08/2010 25  0-37 Final   MODERATE HEMOLYSIS  . ALT (U/L) 12/08/2010 18  0-35 Final  . Alkaline Phosphatase (U/L) 12/08/2010 79  39-117 Final  . Total Bilirubin (mg/dL) 12/08/2010 0.3  0.3-1.2 Final  . GFR calc non Af Amer (mL/min) 12/08/2010 69* >90 Final  . GFR calc Af Amer (mL/min) 12/08/2010 80* >90 Final   Comment:                                 The eGFR has been calculated                          using the CKD EPI equation.                          This calculation has not been                          validated in all clinical                          situations.                          eGFR's persistently                          <90 mL/min signify                          possible Chronic Kidney Disease.  Marland Kitchen Prothrombin Time (seconds)  12/08/2010 13.3  11.6-15.2 Final  . INR  12/08/2010 0.99  0.00-1.49 Final  . Color, Urine  12/08/2010 YELLOW  YELLOW Final  . Appearance  12/08/2010 CLEAR  CLEAR Final  . Specific Gravity, Urine  12/08/2010 1.017  1.005-1.030 Final  . pH  12/08/2010 6.5  5.0-8.0 Final  . Glucose, UA (mg/dL) 12/08/2010 NEGATIVE  NEGATIVE Final  . Hgb urine dipstick  12/08/2010 NEGATIVE  NEGATIVE Final  . Bilirubin Urine  12/08/2010 NEGATIVE  NEGATIVE Final  . Ketones, ur (mg/dL) 12/08/2010 NEGATIVE  NEGATIVE Final  . Protein, ur (mg/dL) 12/08/2010 NEGATIVE  NEGATIVE Final  . Urobilinogen, UA (mg/dL) 12/08/2010 0.2  0.0-1.0 Final  . Nitrite  12/08/2010 NEGATIVE  NEGATIVE Final  . Leukocytes, UA  12/08/2010 NEGATIVE  NEGATIVE Final   MICROSCOPIC NOT DONE ON URINES WITH NEGATIVE PROTEIN, BLOOD, LEUKOCYTES, NITRITE, OR GLUCOSE <1000 mg/dL.  Marland Kitchen MRSA, PCR  12/08/2010 NEGATIVE  NEGATIVE Final  . Staphylococcus aureus  12/08/2010 NEGATIVE  NEGATIVE Final   Comment:                                 The Xpert SA Assay (FDA                           approved for NASAL specimens                          only), is one component of                          a comprehensive surveillance                          program.  It is not intended                          to diagnose infection nor to                          guide or monitor treatment.    X-Rays:Dg Chest 2 View  12/08/2010  *RADIOLOGY REPORT*  Clinical Data: 70 year old female - preoperative respiratory examination.  CHEST - 2 VIEW  Comparison: 11/29/2006  Findings: The cardiomediastinal silhouette is unremarkable. The lungs are clear. There is no evidence of focal airspace disease, pulmonary edema, pulmonary nodule/mass, pleural effusion, or pneumothorax. No acute bony abnormalities are identified. Mild increased density along the left humeral head is noted and AVN is not excluded.  IMPRESSION: No evidence of active cardiopulmonary disease.  Possible left humeral head AVN.  Original Report Authenticated By: Lura Em, M.D.    EKG: Orders placed in visit on 12/08/10  . EKG 12-LEAD  . EKG 12-LEAD     Hospital Course: Patient was admitted to [facility] and taken to the OR and underwent the above state procedure without complications.  Patient tolerated the procedure well and was later transferred to the recovery room and then to the orthopaedic floor for postoperative care.  They were given PO and IV analgesics for pain control following their surgery.  They were given 24 hours of postoperative antibiotics and started on DVT prophylaxis.   PT and OT were ordered for total joint protocol.  Discharge planning consulted to help  with postop disposition and equipment needs.  Patient had a decent night on the evening of surgery and started to get up with therapy on day one.  PCA was discontinued and they were weaned over to PO meds.  Hemovac drain was pulled without difficulty.  Continued to progress with therapy into day two.  Dressing was changed on day two and the  incision was healing with no signs of infection.  By day three, the patient was progressing with therapy but not meeting all her therapy goals.  Incision was healing well.  Patient was seen in rounds and was a little tachycardic.  She was held on day three for monitoring.  She was seen again on day four by Dr. Wynelle Link and was doing a little better but still having pain.  She received one more day of therapy and was discharged home later that day.  Discharge Medications: Prior to Admission medications   Medication Sig Start Date End Date Taking? Authorizing Provider  amLODipine-benazepril (LOTREL) 5-20 MG per capsule Take 1 capsule by mouth daily.    Yes Historical Provider, MD  OVER THE COUNTER MEDICATION Take 1 tablet by mouth. ESTROVEN NIGHT TIME 1 TAB AT BEDTIME    Yes Historical Provider, MD  OVER THE COUNTER MEDICATION Take 1 tablet by mouth daily. DIGESTIVE ADVANTAGE    Yes Historical Provider, MD  traMADol-acetaminophen (ULTRACET) 37.5-325 MG per tablet Take 1 tablet by mouth every 6 (six) hours as needed.     Yes Historical Provider, MD  zolpidem (AMBIEN CR) 12.5 MG CR tablet Take 10 mg by mouth at bedtime.    Yes Historical Provider, MD  methocarbamol (ROBAXIN) 500 MG tablet Take 1 tablet (500 mg total) by mouth every 6 (six) hours as needed. 12/19/10 12/29/10  Dione Plover Aluisio  oxyCODONE (OXY IR/ROXICODONE) 5 MG immediate release tablet Take 1-2 tablets (5-10 mg total) by mouth every 3 (three) hours as needed. 12/19/10 12/29/10  Dione Plover Aluisio  rivaroxaban (XARELTO) 10 MG TABS tablet Take 1 tablet (10 mg total) by mouth daily. 12/19/10   Dione Plover Aluisio    Diet:low sodium  Activity:WBAT   Follow-up:in 2 weeks  Disposition: Home  Discharged Condition: stable   Discharge Orders    Future Orders Please Complete By Expires   Diet - low sodium heart healthy      Call MD / Call 911      Comments:   If you experience chest pain or shortness of breath, CALL 911 and be transported to  the hospital emergency room.  If you develope a fever above 101 F, pus (white drainage) or increased drainage or redness at the wound, or calf pain, call your surgeon's office.   Constipation Prevention      Comments:   Drink plenty of fluids.  Prune juice may be helpful.  You may use a stool softener, such as Colace (over the counter) 100 mg twice a day.  Use MiraLax (over the counter) for constipation as needed.   Increase activity slowly as tolerated      Weight Bearing as taught in Physical Therapy      Comments:   Use a walker or crutches as instructed.   Discharge instructions      Comments:   Pick up stool softner and laxative for home. Do not submerge incision under water. May shower.    Driving restrictions      Comments:   No driving   Lifting restrictions      Comments:  No lifting     Discharge Medication List as of 12/21/2010  9:05 AM    START taking these medications   Details  methocarbamol (ROBAXIN) 500 MG tablet Take 1 tablet (500 mg total) by mouth every 6 (six) hours as needed., Starting 12/19/2010, Until Mon 12/29/10, Print    oxyCODONE (OXY IR/ROXICODONE) 5 MG immediate release tablet Take 1-2 tablets (5-10 mg total) by mouth every 3 (three) hours as needed., Starting 12/19/2010, Until Mon 12/29/10, Print    rivaroxaban (XARELTO) 10 MG TABS tablet Take 1 tablet (10 mg total) by mouth daily., Starting 12/19/2010, Until Discontinued, Print      CONTINUE these medications which have NOT CHANGED   Details  amLODipine-benazepril (LOTREL) 5-20 MG per capsule Take 1 capsule by mouth daily. , Until Discontinued, Historical Med    !! OVER THE COUNTER MEDICATION Take 1 tablet by mouth. ESTROVEN NIGHT TIME 1 TAB AT BEDTIME , Until Discontinued, Historical Med    !! OVER THE COUNTER MEDICATION Take 1 tablet by mouth daily. DIGESTIVE ADVANTAGE , Until Discontinued, Historical Med    traMADol-acetaminophen (ULTRACET) 37.5-325 MG per tablet Take 1 tablet by mouth every 6  (six) hours as needed.  , Until Discontinued, Historical Med    zolpidem (AMBIEN CR) 12.5 MG CR tablet Take 10 mg by mouth at bedtime. , Until Discontinued, Historical Med     !! - Potential duplicate medications found. Please discuss with provider.     Follow-up Information    Follow up with [facility]. Make an appointment on 12/30/2010. (Home Physical Therapy)    Contact information:   [facility] [address], [address] [place] [id number]          Signed: Mickel Crow 12/21/2010, 6:44 PM

## 2010-12-21 NOTE — Progress Notes (Signed)
Subjective: 4 Days Post-Op Procedure(s) (LRB): TOTAL KNEE REVISION (Right) Patient reports pain as mild and moderate.   Patient seen in rounds with Dr. Wynelle Link. Patient has complaints of continued pain.  Slow progress with PT.  Slow appetite. Family in room  Objective: Vital signs in last 24 hours: Temp:  [98.9 F (37.2 C)-99.9 F (37.7 C)] 98.9 F (37.2 C) (11/25 0540) Pulse Rate:  [108-112] 108  (11/25 0540) Resp:  [16] 16  (11/25 0540) BP: (118-130)/(65-74) 118/73 mmHg (11/25 0540) SpO2:  [95 %-97 %] 95 % (11/25 0540)  Intake/Output from previous day:  Intake/Output Summary (Last 24 hours) at 12/21/10 0805 Last data filed at 12/20/10 0904  Gross per 24 hour  Intake    240 ml  Output      0 ml  Net    240 ml    Intake/Output this shift:    Labs: Results for orders placed during the hospital encounter of 12/17/10  TYPE AND SCREEN      Component Value Range   ABO/RH(D) O POS     Antibody Screen NEG     Sample Expiration 12/20/2010    CBC      Component Value Range   WBC 14.4 (*) 4.0 - 10.5 (K/uL)   RBC 3.70 (*) 3.87 - 5.11 (MIL/uL)   Hemoglobin 10.8 (*) 12.0 - 15.0 (g/dL)   HCT 32.3 (*) 36.0 - 46.0 (%)   MCV 87.3  78.0 - 100.0 (fL)   MCH 29.2  26.0 - 34.0 (pg)   MCHC 33.4  30.0 - 36.0 (g/dL)   RDW 13.0  11.5 - 15.5 (%)   Platelets 216  150 - 400 (K/uL)  BASIC METABOLIC PANEL      Component Value Range   Sodium 137  135 - 145 (mEq/L)   Potassium 4.8  3.5 - 5.1 (mEq/L)   Chloride 100  96 - 112 (mEq/L)   CO2 27  19 - 32 (mEq/L)   Glucose, Bld 188 (*) 70 - 99 (mg/dL)   BUN 11  6 - 23 (mg/dL)   Creatinine, Ser 0.84  0.50 - 1.10 (mg/dL)   Calcium 9.6  8.4 - 10.5 (mg/dL)   GFR calc non Af Amer 70 (*) >90 (mL/min)   GFR calc Af Amer 81 (*) >90 (mL/min)  CBC      Component Value Range   WBC 11.2 (*) 4.0 - 10.5 (K/uL)   RBC 3.16 (*) 3.87 - 5.11 (MIL/uL)   Hemoglobin 9.3 (*) 12.0 - 15.0 (g/dL)   HCT 27.6 (*) 36.0 - 46.0 (%)   MCV 87.3  78.0 - 100.0 (fL)   MCH  29.4  26.0 - 34.0 (pg)   MCHC 33.7  30.0 - 36.0 (g/dL)   RDW 13.3  11.5 - 15.5 (%)   Platelets 159  150 - 400 (K/uL)  BASIC METABOLIC PANEL      Component Value Range   Sodium 140  135 - 145 (mEq/L)   Potassium 4.2  3.5 - 5.1 (mEq/L)   Chloride 105  96 - 112 (mEq/L)   CO2 28  19 - 32 (mEq/L)   Glucose, Bld 132 (*) 70 - 99 (mg/dL)   BUN 13  6 - 23 (mg/dL)   Creatinine, Ser 1.07  0.50 - 1.10 (mg/dL)   Calcium 8.7  8.4 - 10.5 (mg/dL)   GFR calc non Af Amer 52 (*) >90 (mL/min)   GFR calc Af Amer 60 (*) >90 (mL/min)  CBC  Component Value Range   WBC 11.3 (*) 4.0 - 10.5 (K/uL)   RBC 3.15 (*) 3.87 - 5.11 (MIL/uL)   Hemoglobin 9.3 (*) 12.0 - 15.0 (g/dL)   HCT 27.3 (*) 36.0 - 46.0 (%)   MCV 86.7  78.0 - 100.0 (fL)   MCH 29.5  26.0 - 34.0 (pg)   MCHC 34.1  30.0 - 36.0 (g/dL)   RDW 13.4  11.5 - 15.5 (%)   Platelets 173  150 - 400 (K/uL)    Exam - Neurovascular intact Sensation intact distally Intact pulses distally Dressing/Incision - clean, dry, no drainage. Staples intact. Motor function intact - moving foot and toes well on exam.   Assessment/Plan: 4 Days Post-Op Procedure(s) (LRB): TOTAL KNEE REVISION (Right)  Past Medical History  Diagnosis Date  . Hypertension   . Hyperthyroidism     nodule on thyroid, no meds.  . Arthritis     knees    Up with therapy Home today DVT Prophylaxis - XareltoProtocol Weight-Bearing as tolerated to right leg COD improving.  [name] 12/21/2010, 8:05 AM

## 2010-12-21 NOTE — Progress Notes (Signed)
Patient stable and ready for discharge. Patient given d/c instructions and prescriptions. Verbalized understanding of d/c instructions.  D/c'd via wheelchair with husband.

## 2010-12-21 NOTE — Progress Notes (Signed)
Physical Therapy Treatment Patient Details Name: [name] MRN: [id number] DOB: July 26, 1940 Today's Date: 12/21/2010 [id number] eg PT Assessment/Plan  PT - Assessment/Plan Comments on Treatment Session: Deccreased pain with steady pain MEDS but increased fatigue - progress continues slow PT Plan: Discharge plan remains appropriate;Frequency remains appropriate PT Frequency: 7X/week Follow Up Recommendations: Home health PT Equipment Recommended: None recommended by PT PT Goals  Acute Rehab PT Goals PT Goal Formulation: With patient Time For Goal Achievement: 7 days Pt will go Supine/Side to Sit: with supervision PT Goal: Supine/Side to Sit - Progress: Partly met Pt will go Sit to Supine/Side: with supervision PT Goal: Sit to Supine/Side - Progress: Partly met Pt will Transfer Sit to Stand/Stand to Sit: with supervision PT Transfer Goal: Sit to Stand/Stand to Sit - Progress: Progressing toward goal Pt will Ambulate: 51 - 150 feet PT Goal: Ambulate - Progress: Progressing toward goal Pt will Go Up / Down Stairs: 3-5 stairs;with min assist;with least restrictive assistive device PT Goal: Up/Down Stairs - Progress: Progressing toward goal  PT Treatment Precautions/Restrictions  Precautions Precautions: Knee Required Braces or Orthoses: Yes Knee Immobilizer: Discontinue once straight leg raise with < 10 degree lag Restrictions Weight Bearing Restrictions: Yes RLE Weight Bearing: Weight bearing as tolerated Mobility (including Balance) Bed Mobility Supine to Sit: 4: Min assist Supine to Sit Details (indicate cue type and reason): assist with R LE Sit to Supine - Right: 4: Min assist Sit to Supine - Right Details (indicate cue type and reason): assist with RLE Transfers Sit to Stand: 5: Supervision;From chair/3-in-1;With upper extremity assist Stand to Sit: 4: Min assist;With armrests;To chair/3-in-1;To bed Ambulation/Gait Ambulation/Gait Assistance: 4: Min  assist Ambulation/Gait Assistance Details (indicate cue type and reason): vc for sequence turns Ambulation Distance (Feet): 30 Feet Assistive device: Rolling walker Gait Pattern: Step-to pattern Stairs: No (pt declined, spouse to assist backward with RW as before)    Exercise  Total Joint Exercises Ankle Circles/Pumps: AROM;Right;10 reps;Supine Quad Sets: AROM;10 reps;Right;Supine Heel Slides: AAROM;10 reps;Supine;Right Straight Leg Raises: AAROM;Right;10 reps;Supine End of Session PT - End of Session Equipment Utilized During Treatment: Right knee immobilizer Activity Tolerance: Patient limited by fatigue;Patient limited by pain Patient left: in bed;with call bell in reach;with family/visitor present Nurse Communication: Mobility status for transfers (pt ready for dc) General Behavior During Session: [place] for tasks performed Cognition: [facility] for tasks performed  Claretha Cooper 12/21/2010, 12:14 PM

## 2010-12-22 ENCOUNTER — Encounter (HOSPITAL_COMMUNITY): Payer: Self-pay | Admitting: Orthopedic Surgery

## 2011-01-05 ENCOUNTER — Ambulatory Visit: Payer: Medicare Other | Attending: Orthopedic Surgery | Admitting: Physical Therapy

## 2011-01-05 DIAGNOSIS — M25669 Stiffness of unspecified knee, not elsewhere classified: Secondary | ICD-10-CM | POA: Insufficient documentation

## 2011-01-05 DIAGNOSIS — M25569 Pain in unspecified knee: Secondary | ICD-10-CM | POA: Insufficient documentation

## 2011-01-05 DIAGNOSIS — IMO0001 Reserved for inherently not codable concepts without codable children: Secondary | ICD-10-CM | POA: Insufficient documentation

## 2011-01-05 DIAGNOSIS — R269 Unspecified abnormalities of gait and mobility: Secondary | ICD-10-CM | POA: Insufficient documentation

## 2011-01-06 ENCOUNTER — Ambulatory Visit: Payer: Medicare Other | Admitting: Physical Therapy

## 2011-01-07 ENCOUNTER — Ambulatory Visit: Payer: Medicare Other | Admitting: Physical Therapy

## 2011-01-12 ENCOUNTER — Ambulatory Visit: Payer: Medicare Other | Admitting: Physical Therapy

## 2011-01-13 ENCOUNTER — Ambulatory Visit: Payer: Medicare Other | Admitting: Physical Therapy

## 2011-01-15 ENCOUNTER — Ambulatory Visit: Payer: Medicare Other | Admitting: Physical Therapy

## 2011-01-21 ENCOUNTER — Ambulatory Visit: Payer: Medicare Other | Admitting: Physical Therapy

## 2011-01-22 ENCOUNTER — Ambulatory Visit: Payer: Medicare Other | Admitting: Physical Therapy

## 2011-01-28 ENCOUNTER — Ambulatory Visit: Payer: Medicare Other | Attending: Orthopedic Surgery | Admitting: Physical Therapy

## 2011-01-28 DIAGNOSIS — R269 Unspecified abnormalities of gait and mobility: Secondary | ICD-10-CM | POA: Insufficient documentation

## 2011-01-28 DIAGNOSIS — M25669 Stiffness of unspecified knee, not elsewhere classified: Secondary | ICD-10-CM | POA: Insufficient documentation

## 2011-01-28 DIAGNOSIS — M25569 Pain in unspecified knee: Secondary | ICD-10-CM | POA: Insufficient documentation

## 2011-01-28 DIAGNOSIS — IMO0001 Reserved for inherently not codable concepts without codable children: Secondary | ICD-10-CM | POA: Insufficient documentation

## 2011-01-29 ENCOUNTER — Ambulatory Visit: Payer: Medicare Other | Admitting: Physical Therapy

## 2011-01-30 ENCOUNTER — Ambulatory Visit: Payer: Medicare Other | Admitting: Physical Therapy

## 2011-05-04 ENCOUNTER — Encounter: Payer: Self-pay | Admitting: Internal Medicine

## 2011-05-04 ENCOUNTER — Ambulatory Visit (INDEPENDENT_AMBULATORY_CARE_PROVIDER_SITE_OTHER): Payer: Medicare Other | Admitting: Internal Medicine

## 2011-05-04 VITALS — BP 141/80 | HR 87 | Temp 97.9°F | Ht 61.75 in | Wt 163.0 lb

## 2011-05-04 DIAGNOSIS — Z780 Asymptomatic menopausal state: Secondary | ICD-10-CM

## 2011-05-04 DIAGNOSIS — N951 Menopausal and female climacteric states: Secondary | ICD-10-CM

## 2011-05-04 DIAGNOSIS — N390 Urinary tract infection, site not specified: Secondary | ICD-10-CM

## 2011-05-04 DIAGNOSIS — M1990 Unspecified osteoarthritis, unspecified site: Secondary | ICD-10-CM

## 2011-05-04 DIAGNOSIS — I10 Essential (primary) hypertension: Secondary | ICD-10-CM

## 2011-05-04 DIAGNOSIS — F411 Generalized anxiety disorder: Secondary | ICD-10-CM

## 2011-05-04 DIAGNOSIS — M810 Age-related osteoporosis without current pathological fracture: Secondary | ICD-10-CM | POA: Insufficient documentation

## 2011-05-04 DIAGNOSIS — F419 Anxiety disorder, unspecified: Secondary | ICD-10-CM | POA: Insufficient documentation

## 2011-05-04 MED ORDER — ZOLPIDEM TARTRATE 10 MG PO TABS: ORAL_TABLET | ORAL | Status: DC

## 2011-05-04 MED ORDER — ZOLPIDEM TARTRATE ER 12.5 MG PO TBCR: EXTENDED_RELEASE_TABLET | ORAL | Status: DC

## 2011-05-04 MED ORDER — CIPROFLOXACIN HCL 500 MG PO TABS: 500.0000 mg | ORAL_TABLET | Freq: Two times a day (BID) | ORAL | Status: DC

## 2011-05-04 NOTE — Progress Notes (Signed)
Patient states here today to discuss side effects of Ambien- made her have hallucinations and make phone calls she doesn't remember and eat things she doesn't remember eating and talking husband then not remember any of conversation. States when first started Cymbalta was prescribed for leg pain 3 months ago.  Taking ambien for 8-10 years.

## 2011-05-04 NOTE — Progress Notes (Signed)
Subjective:    [name], female    DOB: 11/21/1940, 71 y.o.   MRN: [id number]  HPI  New pt here for first visit.  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  Former care with Nurse practitioner [name] at [place].  PMH of DJD S/P TKR times 2, HTN, Menopause, osteoporosis versus ostopenia, and diverticulosis.   [name] is concerned over her sleeping aid.  She describes she has been on Ambien 10 mg for about 10 years.  She has had trouble over last few weeks to months with memory  When taking ambien.  She is also been currently prescribed Cynmbalta for her "muscel pain in my legs".  She was given low dose Amitriptyline 10 mg to take in place of Ambien which she took the night of the hallucinations.   She  Reports she has conversations after taking Ambien that she cannot remember.  Eats in bed cannot remember doing and most recently was told to stop Ambien upbruptly by NP at [facility] and that night had hallucinations.  She now is taking 5 mg of AMbien instead of 10 mg and doing OK  See urinalysis.  She was having some dysuria a few days back per her report but she is asymptomatic now  No CVA tenderness or fever.    Allergies  Allergen Reactions  . Demerol Other (See Comments)    migraines  . Morphine And Related Rash   Past Medical History  Diagnosis Date  . Hypertension   . Hyperthyroidism     nodule on thyroid, no meds.  . Arthritis     knees  . Depression   . Diverticulitis    Past Surgical History  Procedure Date  . Bladder surgery     bladder suspension  . Abdominal hysterectomy 1977  . Total knee revision 12/17/2010    Procedure: TOTAL KNEE REVISION;  Surgeon: [name];  Location: [facility] ORS;  Service: Orthopedics;  Laterality: Right;  . Joint replacement 2009    total knee replacement  . Colon surgery 3 years ago    colonoscopy  . Rotator cuff repair    History   Social History  . Marital Status: Married    Spouse  Name: N/A    Number of Children: N/A  . Years of Education: N/A   Occupational History  . Not on file.   Social History Main Topics  . Smoking status: Never Smoker   . Smokeless tobacco: Never Used  . Alcohol Use: No  . Drug Use: No  . Sexually Active: Not Currently   Other Topics Concern  . Not on file   Social History Narrative  . No narrative on file   Family History  Problem Relation Age of Onset  . Diverticulitis Mother   . Colon cancer Maternal Grandmother    Patient Active Problem List  Diagnoses  . Diverticulitis of sigmoid colon  . Anxiety  . Essential hypertension, benign  . Menopause  . Osteoporosis   Current Outpatient Prescriptions on File Prior to Visit  Medication Sig Dispense Refill  . amLODipine-benazepril (LOTREL) 5-20 MG per capsule Take 1 capsule by mouth daily.       . DULoxetine (CYMBALTA) 30 MG capsule Take 30 mg by mouth daily.      Marland Kitchen estradiol (ESTRACE) 0.5 MG tablet Take 0.5 mg by mouth daily.      Marland Kitchen OVER THE COUNTER MEDICATION Take 1 tablet by mouth daily. DIGESTIVE ADVANTAGE       .  traMADol-acetaminophen (ULTRACET) 37.5-325 MG per tablet Take 1 tablet by mouth every 6 (six) hours as needed.        Marland Kitchen DISCONTD: zolpidem (AMBIEN CR) 12.5 MG CR tablet Take 10 mg by mouth at bedtime.              Review of Systems See HPI    Objective:   Physical Exam  Physical Exam  Nursing note and vitals reviewed.  Constitutional: She is oriented to person, place, and time. She appears well-developed and well-nourished.  HENT:  Head: Normocephalic and atraumatic.  Cardiovascular: Normal rate and regular rhythm. Exam reveals no gallop and no friction rub.  No murmur heard.  Pulmonary/Chest: Breath sounds normal. She has no wheezes. She has no rales.  Abd:  No CVA tenderness  NO HSM  BS pos nontender Neurological: She is alert and oriented to person, place, and time.   Affect appropriate.   Skin: Skin is warm and dry.  Psychiatric: She has a  normal mood and affect. Her behavior is normal.      Assessment & Plan:  1)  Ambien intolerance/rapid withdrawal:  She has amnesia while taking med and hallucination may have been caused either by rapid withdrawal or contribution of Amityriptyline.  Will slowly withdraw ambien.  She is to take 5 mg qhs prn for the next 2 weeks then will change to Intermezzo and eventually discontinue and likely change to Temazepam depending on clinical response 2)  Leg pain not sure of etiology at this point.  Pt would like to stop Cymbalta and I counseled to slowely withdraw qod for the next week then stop.  She voices undertstanding 3)  Probable UTI cipro 500 g bid for 5 days 4) DJD 5)  Menopause 6)  HTn 7)  H/o diverticulosis  See me in 2-3 weeks.  Sign for old records

## 2011-05-04 NOTE — Patient Instructions (Signed)
See me in 2-3 weeks. Take only 5 mg of Ambien as needed

## 2011-05-05 LAB — URINE CULTURE: Organism ID, Bacteria: NO GROWTH

## 2011-05-10 ENCOUNTER — Encounter: Payer: Self-pay | Admitting: Internal Medicine

## 2011-05-10 DIAGNOSIS — Z8619 Personal history of other infectious and parasitic diseases: Secondary | ICD-10-CM | POA: Insufficient documentation

## 2011-05-10 DIAGNOSIS — Z862 Personal history of diseases of the blood and blood-forming organs and certain disorders involving the immune mechanism: Secondary | ICD-10-CM | POA: Insufficient documentation

## 2011-05-25 ENCOUNTER — Encounter: Payer: Self-pay | Admitting: Internal Medicine

## 2011-05-25 ENCOUNTER — Ambulatory Visit (INDEPENDENT_AMBULATORY_CARE_PROVIDER_SITE_OTHER): Payer: Medicare Other | Admitting: Internal Medicine

## 2011-05-25 VITALS — BP 134/67 | HR 81 | Temp 98.5°F | Ht 61.75 in | Wt 164.5 lb

## 2011-05-25 DIAGNOSIS — N390 Urinary tract infection, site not specified: Secondary | ICD-10-CM

## 2011-05-25 LAB — POCT URINALYSIS DIPSTICK
Blood, UA: NEGATIVE
Ketones, UA: NEGATIVE
Nitrite, UA: NEGATIVE
pH, UA: 6

## 2011-05-25 MED ORDER — BENAZEPRIL-HYDROCHLOROTHIAZIDE 20-12.5 MG PO TABS: 1.0000 | ORAL_TABLET | Freq: Every day | ORAL | Status: DC

## 2011-05-25 MED ORDER — TEMAZEPAM 7.5 MG PO CAPS: 7.5000 mg | ORAL_CAPSULE | Freq: Every evening | ORAL | Status: DC | PRN

## 2011-05-25 NOTE — Progress Notes (Signed)
Called in prescription for temazepam 7.5 mg - take one by mouth at bedtime as needed for sleep - #30 with 1 refill- called in to [facility] per Dr. Coralyn Mark order because it did not eprescribe.

## 2011-05-25 NOTE — Progress Notes (Signed)
Subjective:    [name], female    DOB: [birth year], 71 y.o.   MRN: [id number]  HPI  [name] is here for follow up .  Last visit I tapered her Ambien down to 5 mg and she states she is doing much better, no amnesia, no hallucinations.     She also would like to try a less expensive BP med.  No headache no chest pain, no [place]  Allergies  Allergen Reactions  . Ambien     intolerance  . Amitriptyline     intolerance  . Demerol Other (See Comments)    migraines  . Morphine And Related Rash   Past Medical History  Diagnosis Date  . Hypertension   . Hyperthyroidism     nodule on thyroid, no meds.  . Arthritis     knees  . Depression   . Diverticulitis    Past Surgical History  Procedure Date  . Bladder surgery     bladder suspension  . Abdominal hysterectomy 1977  . Total knee revision 12/17/2010    Procedure: TOTAL KNEE REVISION;  Surgeon: Dione Plover Aluisio;  Location: [facility];  Service: Orthopedics;  Laterality: Right;  . Joint replacement 2009    total knee replacement  . Colon surgery 3 years ago    colonoscopy  . Rotator cuff repair    History   Social History  . Marital Status: Married    Spouse Name: N/A    Number of Children: N/A  . Years of Education: N/A   Occupational History  . Not on file.   Social History Main Topics  . Smoking status: Never Smoker   . Smokeless tobacco: Never Used  . Alcohol Use: No  . Drug Use: No  . Sexually Active: Not Currently   Other Topics Concern  . Not on file   Social History Narrative  . No narrative on file   Family History  Problem Relation Age of Onset  . Diverticulitis Mother   . Colon cancer Maternal Grandmother    Patient Active Problem List  Diagnoses  . Diverticulitis of sigmoid colon  . Anxiety  . Essential hypertension, benign  . Menopause  . Osteoporosis  . DJD (degenerative joint disease)  . History of anemia  . History of herpes simplex infection   Current Outpatient  Prescriptions on File Prior to Visit  Medication Sig Dispense Refill  . DULoxetine (CYMBALTA) 30 MG capsule Take 30 mg by mouth every other day.       . estradiol (ESTRACE) 0.5 MG tablet Take 0.5 mg by mouth daily.      Marland Kitchen OVER THE COUNTER MEDICATION Take 1 tablet by mouth daily. DIGESTIVE ADVANTAGE       . traMADol-acetaminophen (ULTRACET) 37.5-325 MG per tablet Take 1 tablet by mouth every 6 (six) hours as needed.        . benazepril-hydrochlorthiazide (LOTENSIN HCT) 20-12.5 MG per tablet Take 1 tablet by mouth daily.  30 tablet  3  . temazepam (RESTORIL) 7.5 MG capsule Take 1 capsule (7.5 mg total) by mouth at bedtime as needed for sleep.  30 capsule  1       Review of Systems see HPI   Objective:   Physical Exam  Physical Exam  Nursing note and vitals reviewed.  Constitutional: She is oriented to person, place, and time. She appears well-developed and well-nourished.  HENT:  Head: Normocephalic and atraumatic.  Cardiovascular: Normal rate and regular rhythm. Exam reveals no  gallop and no friction rub.  No murmur heard.  Pulmonary/Chest: Breath sounds normal. She has no wheezes. She has no rales.  Neurological: She is alert and oriented to person, place, and time.  Skin: Skin is warm and dry.  Psychiatric: She has a normal mood and affect. Her behavior is normal.  Ext no edema      Assessment & Plan:  1)  Chronic insomnia  Med dependence:  Will change to Restoril 7.5 mg nightly.  Ok if low dose does not work, can go to 15 mg.  Pt voices understanding 2)  HTN.  Will change to benazepril/hctz 20/12.5  See me in two months  Will need CPE in October.   Will check labs next visit.

## 2011-05-25 NOTE — Patient Instructions (Signed)
See me in two months  Take new meds as prescribed

## 2011-05-26 ENCOUNTER — Telehealth: Payer: Self-pay | Admitting: Internal Medicine

## 2011-05-26 NOTE — Telephone Encounter (Signed)
[name]   [facility] [place] and ask the price pt would pay for Lunesta 2 mg and price the Restoril 7.5 mg 30 day supply  Also check with downstairs pharmacy and price the two above meds for 30 day supply

## 2011-05-26 NOTE — Telephone Encounter (Signed)
[facility]- cheapest option is Lunesta - cost $45-Discussed with Dr.Schoenhoff-  [facility] . Called patient to notify

## 2011-05-26 NOTE — Telephone Encounter (Signed)
Pt called in stating the sleep medication that prescribe to her was to high ($215). Medicare will not pay for it; she want to know if you can prescribe something else for her. Please call her at [phone]

## 2011-06-03 ENCOUNTER — Telehealth: Payer: Self-pay | Admitting: Internal Medicine

## 2011-06-03 NOTE — Telephone Encounter (Signed)
Pt states she is not getting enough sleep on Lunesta. (max of 2 hrs). She has been taking 1/2 of an Ambien, to get some sleep. She would like to know how she can get off of it all together. Please call at [phone]

## 2011-06-04 NOTE — Telephone Encounter (Signed)
Spoke with pt.  Lunest 2 mg waking up every 2 hours  She tried 1/2 of ambien 5 mg and does well with this.  She has been on sleeping meds for 10 years  Ok to try Lunesta  3 mg or to take 1/2 of a 5 mg tablet of ambien nightly or qo night.  Pt voices understanding

## 2011-06-05 ENCOUNTER — Other Ambulatory Visit: Payer: Self-pay | Admitting: Obstetrics and Gynecology

## 2011-06-05 DIAGNOSIS — Z1231 Encounter for screening mammogram for malignant neoplasm of breast: Secondary | ICD-10-CM

## 2011-06-09 MED ORDER — ESZOPICLONE 2 MG PO TABS: 2.0000 mg | ORAL_TABLET | Freq: Every day | ORAL | Status: DC

## 2011-06-15 ENCOUNTER — Telehealth: Payer: Self-pay | Admitting: Internal Medicine

## 2011-06-15 ENCOUNTER — Other Ambulatory Visit: Payer: Self-pay | Admitting: *Deleted

## 2011-06-15 NOTE — Telephone Encounter (Signed)
She will come by and pick up script as she knows it has to be printed off in [facility]

## 2011-06-15 NOTE — Telephone Encounter (Signed)
[name] called requesting a refill on her Ambien prescription.

## 2011-06-16 MED ORDER — ZOLPIDEM TARTRATE 10 MG PO TABS: ORAL_TABLET | ORAL | Status: DC

## 2011-06-24 ENCOUNTER — Other Ambulatory Visit: Payer: Self-pay | Admitting: *Deleted

## 2011-06-24 DIAGNOSIS — R51 Headache: Secondary | ICD-10-CM

## 2011-06-24 MED ORDER — TRAMADOL-ACETAMINOPHEN 37.5-325 MG PO TABS: 1.0000 | ORAL_TABLET | Freq: Four times a day (QID) | ORAL | Status: DC | PRN

## 2011-06-25 ENCOUNTER — Encounter: Payer: Self-pay | Admitting: Internal Medicine

## 2011-06-30 ENCOUNTER — Ambulatory Visit (HOSPITAL_COMMUNITY)
Admission: RE | Admit: 2011-06-30 | Discharge: 2011-06-30 | Disposition: A | Discharge status: Home / Self Care | Payer: Medicare Other | Source: Ambulatory Visit | Attending: Obstetrics and Gynecology | Admitting: Obstetrics and Gynecology

## 2011-06-30 DIAGNOSIS — Z1231 Encounter for screening mammogram for malignant neoplasm of breast: Secondary | ICD-10-CM

## 2011-07-20 ENCOUNTER — Other Ambulatory Visit: Payer: Self-pay | Admitting: *Deleted

## 2011-07-20 ENCOUNTER — Telehealth: Payer: Self-pay | Admitting: Internal Medicine

## 2011-07-20 DIAGNOSIS — R51 Headache: Secondary | ICD-10-CM

## 2011-07-20 NOTE — Telephone Encounter (Signed)
Pt states she needs refill on: Tramadol-Acetaminophen (Tab) ULTRACET 37.5-325 MG Take 1 tablet by mouth every 6 (six) hours as needed.   [facility] [place] [id number]. Thanks ... Ad

## 2011-07-21 ENCOUNTER — Other Ambulatory Visit: Payer: Self-pay | Admitting: *Deleted

## 2011-07-21 MED ORDER — TRAMADOL-ACETAMINOPHEN 37.5-325 MG PO TABS: 1.0000 | ORAL_TABLET | Freq: Four times a day (QID) | ORAL | Status: DC | PRN

## 2011-07-21 NOTE — Telephone Encounter (Signed)
RX for Ultracet called [facility] [place] per Dr Coralyn Mark.

## 2011-07-23 ENCOUNTER — Ambulatory Visit (INDEPENDENT_AMBULATORY_CARE_PROVIDER_SITE_OTHER): Payer: Medicare Other | Admitting: Internal Medicine

## 2011-07-23 ENCOUNTER — Encounter: Payer: Self-pay | Admitting: Internal Medicine

## 2011-07-23 VITALS — BP 134/72 | HR 105 | Temp 97.4°F | Resp 16 | Ht 62.5 in | Wt 166.0 lb

## 2011-07-23 DIAGNOSIS — I10 Essential (primary) hypertension: Secondary | ICD-10-CM

## 2011-07-23 DIAGNOSIS — M1990 Unspecified osteoarthritis, unspecified site: Secondary | ICD-10-CM

## 2011-07-23 DIAGNOSIS — F411 Generalized anxiety disorder: Secondary | ICD-10-CM

## 2011-07-23 DIAGNOSIS — G4700 Insomnia, unspecified: Secondary | ICD-10-CM

## 2011-07-23 DIAGNOSIS — F419 Anxiety disorder, unspecified: Secondary | ICD-10-CM

## 2011-07-23 LAB — BASIC METABOLIC PANEL
BUN: 12 mg/dL (ref 6–23)
CO2: 26 mEq/L (ref 19–32)
Chloride: 102 mEq/L (ref 96–112)
Creat: 1 mg/dL (ref 0.50–1.10)
Glucose, Bld: 115 mg/dL — ABNORMAL HIGH (ref 70–99)

## 2011-07-23 MED ORDER — MOMETASONE FUROATE 50 MCG/ACT NA SUSP: NASAL | Status: DC

## 2011-07-23 NOTE — Progress Notes (Signed)
Subjective:    [name], female    DOB: May 26, 1940, 71 y.o.   MRN: [id number]  HPI [name] is doing well with her BP control since we switched her to Lotensin/HCTZ.  She is tearful today as her daughter has been recently diagnosed with DCIS .  Daughter will undergo an MRI tomorrow.  She is tolerating low dose AMbien 5 mg well.  No hallucinations or personality changes  Allergies  Allergen Reactions  . Amitriptyline     intolerance  . Demerol Other (See Comments)    migraines  . Zolpidem Tartrate     intolerance  . Morphine And Related Rash   Past Medical History  Diagnosis Date  . Hypertension   . Hyperthyroidism     nodule on thyroid, no meds.  . Arthritis     knees  . Depression   . Diverticulitis    Past Surgical History  Procedure Date  . Bladder surgery     bladder suspension  . Abdominal hysterectomy 1977  . Total knee revision 12/17/2010    Procedure: TOTAL KNEE REVISION;  Surgeon: Dione Plover Aluisio;  Location: [facility];  Service: Orthopedics;  Laterality: Right;  . Joint replacement 2009    total knee replacement  . Colon surgery 3 years ago    colonoscopy  . Rotator cuff repair    History   Social History  . Marital Status: Married    Spouse Name: N/A    Number of Children: N/A  . Years of Education: N/A   Occupational History  . Not on file.   Social History Main Topics  . Smoking status: Never Smoker   . Smokeless tobacco: Never Used  . Alcohol Use: No  . Drug Use: No  . Sexually Active: Not Currently   Other Topics Concern  . Not on file   Social History Narrative  . No narrative on file   Family History  Problem Relation Age of Onset  . Diverticulitis Mother   . Colon cancer Maternal Grandmother    Patient Active Problem List  Diagnosis  . Diverticulitis of sigmoid colon  . Anxiety  . Essential hypertension, benign  . Menopause  . Osteoporosis  . DJD (degenerative joint disease)  . History of anemia  . History  of herpes simplex infection   Current Outpatient Prescriptions on File Prior to Visit  Medication Sig Dispense Refill  . amLODipine-benazepril (LOTREL) 5-20 MG per capsule       . benazepril-hydrochlorthiazide (LOTENSIN HCT) 20-12.5 MG per tablet Take 1 tablet by mouth daily.  30 tablet  3  . estradiol (ESTRACE) 0.5 MG tablet Take 0.5 mg by mouth daily.      . mometasone (NASONEX) 50 MCG/ACT nasal spray One spray into nose daily  17 g  1  . OVER THE COUNTER MEDICATION Take 1 tablet by mouth daily. DIGESTIVE ADVANTAGE       . temazepam (RESTORIL) 7.5 MG capsule Take 1 capsule (7.5 mg total) by mouth at bedtime as needed for sleep.  30 capsule  1  . traMADol-acetaminophen (ULTRACET) 37.5-325 MG per tablet Take 1 tablet by mouth every 6 (six) hours as needed.  30 tablet  1  . valACYclovir (VALTREX) 1000 MG tablet       . zolpidem (AMBIEN) 10 MG tablet Take 1/2 tablet at bedtime  30 tablet  3       Review of Systems See HPI    Objective:   Physical Exam  Physical Exam  Nursing note and vitals reviewed.  Constitutional: She is oriented to person, place, and time. She appears well-developed and well-nourished.  HENT:  Head: Normocephalic and atraumatic.  Cardiovascular: Normal rate and regular rhythm. Exam reveals no gallop and no friction rub.  No murmur heard.  Pulmonary/Chest: Breath sounds normal. She has no wheezes. She has no rales.  Neurological: She is alert and oriented to person, place, and time.  Skin: Skin is warm and dry.  Psychiatric: She has a normal mood and affect. Her behavior is normal.             Assessment & Plan:  HTN:  Will check K today and continue curretn med  Insomnia  Lunesta and restoril did not work for pt.  Continue low dose Ambien.  She is dependent on meds for sleep  Anxiety  DJD  Schedule CPE

## 2011-07-23 NOTE — Patient Instructions (Signed)
Schedule CPE with me

## 2011-07-27 ENCOUNTER — Telehealth: Payer: Self-pay | Admitting: *Deleted

## 2011-07-27 NOTE — Telephone Encounter (Signed)
Copy of labs mailed to pt's home address. 

## 2011-08-19 ENCOUNTER — Telehealth: Payer: Self-pay | Admitting: Internal Medicine

## 2011-08-19 DIAGNOSIS — R51 Headache: Secondary | ICD-10-CM

## 2011-08-19 MED ORDER — TRAMADOL-ACETAMINOPHEN 37.5-325 MG PO TABS: 1.0000 | ORAL_TABLET | Freq: Four times a day (QID) | ORAL | Status: DC | PRN

## 2011-08-19 NOTE — Telephone Encounter (Signed)
Pt needs some M[facility]INE [facility] [place]... If there any questions or concerns [phone]

## 2011-08-19 NOTE — Telephone Encounter (Signed)
[facility] Dr Coralyn Mark.

## 2011-09-16 ENCOUNTER — Telehealth: Payer: Self-pay | Admitting: Internal Medicine

## 2011-09-16 NOTE — Telephone Encounter (Signed)
Pt states she has taken her last hormone pill.. She states that Dr. Coralyn Mark talked about putting her on a new replacement (hormone) pill... Its to take care of her hot flashes... If there any questions please call pt at [phone]... Thanks

## 2011-09-17 NOTE — Telephone Encounter (Signed)
Lm on voicemail that Dr Coralyn Mark needs to discuss in the office with her about the hormones.  Can either see her today or the first of next week.  Left instructions for her to call the office.

## 2011-09-17 NOTE — Telephone Encounter (Signed)
[name]  See if pt can come in today or Monday

## 2011-09-21 ENCOUNTER — Other Ambulatory Visit: Payer: Self-pay | Admitting: Internal Medicine

## 2011-10-05 ENCOUNTER — Ambulatory Visit: Payer: Self-pay | Admitting: Obstetrics and Gynecology

## 2011-10-28 ENCOUNTER — Other Ambulatory Visit: Payer: Self-pay | Admitting: Internal Medicine

## 2011-11-04 ENCOUNTER — Other Ambulatory Visit: Payer: Self-pay | Admitting: Dermatology

## 2011-11-30 ENCOUNTER — Ambulatory Visit (INDEPENDENT_AMBULATORY_CARE_PROVIDER_SITE_OTHER): Payer: Medicare Other | Admitting: Internal Medicine

## 2011-11-30 ENCOUNTER — Encounter: Payer: Self-pay | Admitting: Internal Medicine

## 2011-11-30 ENCOUNTER — Ambulatory Visit (HOSPITAL_BASED_OUTPATIENT_CLINIC_OR_DEPARTMENT_OTHER)
Admission: RE | Admit: 2011-11-30 | Discharge: 2011-11-30 | Disposition: A | Discharge status: Home / Self Care | Payer: Medicare Other | Source: Ambulatory Visit | Attending: Internal Medicine | Admitting: Internal Medicine

## 2011-11-30 VITALS — BP 121/67 | HR 77 | Temp 98.3°F | Resp 18 | Ht 63.0 in | Wt 166.0 lb

## 2011-11-30 DIAGNOSIS — IMO0001 Reserved for inherently not codable concepts without codable children: Secondary | ICD-10-CM

## 2011-11-30 DIAGNOSIS — E042 Nontoxic multinodular goiter: Secondary | ICD-10-CM | POA: Insufficient documentation

## 2011-11-30 DIAGNOSIS — K5732 Diverticulitis of large intestine without perforation or abscess without bleeding: Secondary | ICD-10-CM

## 2011-11-30 DIAGNOSIS — K219 Gastro-esophageal reflux disease without esophagitis: Secondary | ICD-10-CM | POA: Insufficient documentation

## 2011-11-30 DIAGNOSIS — M1990 Unspecified osteoarthritis, unspecified site: Secondary | ICD-10-CM

## 2011-11-30 DIAGNOSIS — M810 Age-related osteoporosis without current pathological fracture: Secondary | ICD-10-CM

## 2011-11-30 DIAGNOSIS — I10 Essential (primary) hypertension: Secondary | ICD-10-CM

## 2011-11-30 DIAGNOSIS — M791 Myalgia, unspecified site: Secondary | ICD-10-CM

## 2011-11-30 DIAGNOSIS — Z90710 Acquired absence of both cervix and uterus: Secondary | ICD-10-CM | POA: Insufficient documentation

## 2011-11-30 DIAGNOSIS — Z0000 Encounter for general adult medical examination without abnormal findings: Secondary | ICD-10-CM

## 2011-11-30 DIAGNOSIS — Z23 Encounter for immunization: Secondary | ICD-10-CM

## 2011-11-30 LAB — CBC WITH DIFFERENTIAL/PLATELET
Basophils Absolute: 0 10*3/uL (ref 0.0–0.1)
Basophils Relative: 1 % (ref 0–1)
HCT: 39.5 % (ref 36.0–46.0)
MCHC: 34.2 g/dL (ref 30.0–36.0)
Monocytes Absolute: 0.4 10*3/uL (ref 0.1–1.0)
Neutro Abs: 4 10*3/uL (ref 1.7–7.7)
Platelets: 246 10*3/uL (ref 150–400)
RDW: 14 % (ref 11.5–15.5)

## 2011-11-30 LAB — POCT URINALYSIS DIPSTICK
Bilirubin, UA: NEGATIVE
Blood, UA: NEGATIVE
Glucose, UA: NEGATIVE
Ketones, UA: NEGATIVE
Leukocytes, UA: NEGATIVE
Nitrite, UA: NEGATIVE
Protein, UA: NEGATIVE
Spec Grav, UA: 1.02
Urobilinogen, UA: NEGATIVE
pH, UA: 6

## 2011-11-30 LAB — COMPREHENSIVE METABOLIC PANEL
ALT: 20 U/L (ref 0–35)
CO2: 25 mEq/L (ref 19–32)
Calcium: 9.9 mg/dL (ref 8.4–10.5)
Chloride: 103 mEq/L (ref 96–112)
Glucose, Bld: 81 mg/dL (ref 70–99)
Sodium: 142 mEq/L (ref 135–145)
Total Bilirubin: 0.5 mg/dL (ref 0.3–1.2)
Total Protein: 6.8 g/dL (ref 6.0–8.3)

## 2011-11-30 LAB — LIPID PANEL
Cholesterol: 230 mg/dL — ABNORMAL HIGH (ref 0–200)
Triglycerides: 225 mg/dL — ABNORMAL HIGH (ref <150)
VLDL: 45 mg/dL — ABNORMAL HIGH (ref 0–40)

## 2011-11-30 MED ORDER — PNEUMOCOCCAL VAC POLYVALENT 25 MCG/0.5ML IJ INJ: 0.5000 mL | INJECTION | Freq: Once | INTRAMUSCULAR | Status: DC

## 2011-11-30 NOTE — Patient Instructions (Addendum)
Call if arthritis pain worsens  Ok to try extra strength Tyelenol twice a day

## 2011-11-30 NOTE — Progress Notes (Signed)
Subjective:    [name], female    DOB: [birth year], 71 y.o.   MRN: [id number]  HPI [name] is here for CPE  She reports occasional GERD relieved by Tums  No change in color of stool  Reports she has seen dR. Gerkin in the past for thyroid nodules.  U/s done 2008 reveals tiny nodules  Also notes she will have days where she "hurts head to toe"  Joints achy but also achy in muscled.  She took her sister's Ultram and felt better the next day.  These myalgias happen every few months.  Joints will ache when weather changes.   No myalgias today  Tolerating BP med well  No chest pain or pressure  Now off estradiol and has occasional day and night time sweats.  She has an OTC med she wishes to use  Allergies  Allergen Reactions  . Amitriptyline     intolerance  . Demerol Other (See Comments)    migraines  . Zolpidem Tartrate     intolerance  . Morphine And Related Rash   Past Medical History  Diagnosis Date  . Hypertension   . Hyperthyroidism     nodule on thyroid, no meds.  . Arthritis     knees  . Depression   . Diverticulitis    Past Surgical History  Procedure Date  . Bladder surgery     bladder suspension  . Abdominal hysterectomy 1977  . Total knee revision 12/17/2010    Procedure: TOTAL KNEE REVISION;  Surgeon: Dione Plover Aluisio;  Location: [facility];  Service: Orthopedics;  Laterality: Right;  . Joint replacement 2009    total knee replacement  . Colon surgery 3 years ago    colonoscopy  . Rotator cuff repair    History   Social History  . Marital Status: Married    Spouse Name: N/A    Number of Children: N/A  . Years of Education: N/A   Occupational History  . Not on file.   Social History Main Topics  . Smoking status: Never Smoker   . Smokeless tobacco: Never Used  . Alcohol Use: No  . Drug Use: No  . Sexually Active: Not Currently   Other Topics Concern  . Not on file   Social History Narrative  . No narrative on file   Family  History  Problem Relation Age of Onset  . Diverticulitis Mother   . Colon cancer Maternal Grandmother    Patient Active Problem List  Diagnosis  . Diverticulitis of sigmoid colon  . Anxiety  . Essential hypertension, benign  . Menopause  . Osteoporosis  . DJD (degenerative joint disease)  . History of anemia  . History of herpes simplex infection  . Insomnia   Current Outpatient Prescriptions on File Prior to Visit  Medication Sig Dispense Refill  . amLODipine-benazepril (LOTREL) 5-20 MG per capsule       . benazepril-hydrochlorthiazide (LOTENSIN HCT) 20-12.5 MG per tablet TAKE 1 TABLET BY MOUTH DAILY.  90 tablet  1  . traMADol-acetaminophen (ULTRACET) 37.5-325 MG per tablet TAKE 1 TABLET EVERY 6 HOURS AS NEEDED  30 tablet  2  . estradiol (ESTRACE) 0.5 MG tablet Take 0.5 mg by mouth daily.      . mometasone (NASONEX) 50 MCG/ACT nasal spray One spray into nose daily  17 g  1  . OVER THE COUNTER MEDICATION Take 1 tablet by mouth daily. DIGESTIVE ADVANTAGE       . temazepam (RESTORIL)  7.5 MG capsule Take 1 capsule (7.5 mg total) by mouth at bedtime as needed for sleep.  30 capsule  1  . valACYclovir (VALTREX) 1000 MG tablet       . zolpidem (AMBIEN) 10 MG tablet Take 1/2 tablet at bedtime  30 tablet  3       Review of Systems  All other systems reviewed and are negative.  see positives is HPI     Objective:   Physical Exam  Physical Exam  Nursing note and vitals reviewed.  Constitutional: She is oriented to person, place, and time. She appears well-developed and well-nourished.  HENT:  Head: Normocephalic and atraumatic.  Right Ear: Tympanic membrane and ear canal normal. No drainage. Tympanic membrane is not injected and not erythematous.  Left Ear: Tympanic membrane and ear canal normal. No drainage. Tympanic membrane is not injected and not erythematous.  Nose: Nose normal. Right sinus exhibits no maxillary sinus tenderness and no frontal sinus tenderness. Left sinus  exhibits no maxillary sinus tenderness and no frontal sinus tenderness.  Mouth/Throat: Oropharynx is clear and moist. No oral lesions. No oropharyngeal exudate.  Eyes: Conjunctivae and EOM are normal. Pupils are equal, round, and reactive to light.  Neck: Normal range of motion. Neck supple. No JVD present. Carotid bruit is not present. No mass and no thyromegaly present.  Cardiovascular: Normal rate, regular rhythm, S1 normal, S2 normal and intact distal pulses. Exam reveals no gallop and no friction rub.  No murmur heard.  Pulses:  Carotid pulses are 2+ on the right side, and 2+ on the left side.  Dorsalis pedis pulses are 2+ on the right side, and 2+ on the left side.  No carotid bruit. No LE edema  Pulmonary/Chest: Breath sounds normal. She has no wheezes. She has no rales. She exhibits no tenderness. Breast  No discrete masses no nipple discharge no axillary adenopathy bilaterally Abdominal: Soft. Bowel sounds are normal. She exhibits no distension and no mass. There is no hepatosplenomegaly. There is no tenderness. There is no CVA tenderness.   Rectal no mass guaiac neg Musculoskeletal: Normal range of motion.  No active synovitis to joints.  Lymphadenopathy:  She has no cervical adenopathy.  She has no axillary adenopathy.  Right: No inguinal and no supraclavicular adenopathy present.  Left: No inguinal and no supraclavicular adenopathy present.  Neurological: She is alert and oriented to person, place, and time. She has normal strength and normal reflexes. She displays no tremor. No cranial nerve deficit or sensory deficit. Coordination and gait normal.  Skin: Skin is warm and dry. No rash noted. No cyanosis. Nails show no clubbing.  Psychiatric: She has a normal mood and affect. Her speech is normal and behavior is normal. Cognition and memory are normal.  Joints no active synovitis  No pain to muslce belly with palpation         Assessment & Plan:  Health Maintenance  Will  give pneumovax today.  MM done in spring.  Will schedule bone density  HTN  Well controlled  History of MNG  Will get thyroid U/S today  DJD  OK to try Tylenol es 2 tabs bid.  If not better willl need rheumatolgy referral.    Osteopporosis wil schedule bone density  Pt requests [facility]  Myalgias  Will check ana, rf, aldolase today  Vasomotor flushes  Advised to get chillow and frog togg.  She does not wish  To try anything by RX now  Diverticulosis  GERD  Advised PPI but pt declines

## 2011-12-01 LAB — SEDIMENTATION RATE: Sed Rate: 4 mm/hr (ref 0–22)

## 2011-12-01 LAB — VITAMIN D 25 HYDROXY (VIT D DEFICIENCY, FRACTURES): Vit D, 25-Hydroxy: 40 ng/mL (ref 30–89)

## 2011-12-01 LAB — TSH: TSH: 0.409 u[IU]/mL (ref 0.350–4.500)

## 2011-12-01 LAB — ANA: Anti Nuclear Antibody(ANA): NEGATIVE

## 2011-12-03 ENCOUNTER — Telehealth: Payer: Self-pay | Admitting: Internal Medicine

## 2011-12-03 DIAGNOSIS — E042 Nontoxic multinodular goiter: Secondary | ICD-10-CM

## 2011-12-03 MED ORDER — TRAMADOL-ACETAMINOPHEN 37.5-325 MG PO TABS: 1.0000 | ORAL_TABLET | Freq: Four times a day (QID) | ORAL | Status: DC | PRN

## 2011-12-03 NOTE — Telephone Encounter (Signed)
Spoke with pt and informed of thryoid ultrasound results.  Will set up referral to endocrinology  Dr. Loanne Drilling for further eval  Bobbie Call in [place] [facility] [place]

## 2011-12-03 NOTE — Telephone Encounter (Signed)
Pt needs refill on Tramadol-Acetaminophen (Tab) ULTRACET 37.5-325 MG TAKE 1 TABLET EVERY 6 HOURS AS NEEDED [facility].Marland KitchenMarland Kitchen

## 2011-12-04 ENCOUNTER — Other Ambulatory Visit: Payer: Self-pay | Admitting: Internal Medicine

## 2011-12-04 ENCOUNTER — Other Ambulatory Visit: Payer: Self-pay | Admitting: *Deleted

## 2011-12-04 NOTE — Telephone Encounter (Signed)
[facility]

## 2011-12-07 ENCOUNTER — Ambulatory Visit (HOSPITAL_COMMUNITY): Payer: Medicare Other

## 2011-12-10 ENCOUNTER — Telehealth: Payer: Self-pay | Admitting: Internal Medicine

## 2011-12-10 ENCOUNTER — Ambulatory Visit (INDEPENDENT_AMBULATORY_CARE_PROVIDER_SITE_OTHER): Payer: Medicare Other | Admitting: Endocrinology

## 2011-12-10 ENCOUNTER — Encounter: Payer: Self-pay | Admitting: Endocrinology

## 2011-12-10 VITALS — BP 118/76 | HR 77 | Temp 98.2°F | Wt 168.0 lb

## 2011-12-10 DIAGNOSIS — E042 Nontoxic multinodular goiter: Secondary | ICD-10-CM

## 2011-12-10 NOTE — Progress Notes (Signed)
Subjective:    [name], female    DOB: [birth year], 71 y.o.   MRN: [id number]  HPI In 2007, pt was incidentally noted at the anterior neck to have slight swelling.  Since then, she has had serial ultrasounds.  She had a bx in 2007.  She has never been on thyroid medication.  She has an intermittent sensation of slight swelling at the anterior neck, but no assoc pain.   Past Medical History  Diagnosis Date  . Hypertension   . Hyperthyroidism     nodule on thyroid, no meds.  . Arthritis     knees  . Depression   . Diverticulitis     Past Surgical History  Procedure Date  . Bladder surgery     bladder suspension  . Abdominal hysterectomy 1977  . Total knee revision 12/17/2010    Procedure: TOTAL KNEE REVISION;  Surgeon: Dione Plover Aluisio;  Location: [facility];  Service: Orthopedics;  Laterality: Right;  . Joint replacement 2009    total knee replacement  . Colon surgery 3 years ago    colonoscopy  . Rotator cuff repair     History   Social History  . Marital Status: Married    Spouse Name: N/A    Number of Children: N/A  . Years of Education: N/A   Occupational History  . Not on file.   Social History Main Topics  . Smoking status: Never Smoker   . Smokeless tobacco: Never Used  . Alcohol Use: No  . Drug Use: No  . Sexually Active: Not Currently   Other Topics Concern  . Not on file   Social History Narrative  . No narrative on file    Current Outpatient Prescriptions on File Prior to Visit  Medication Sig Dispense Refill  . amLODipine-benazepril (LOTREL) 5-20 MG per capsule       . benazepril-hydrochlorthiazide (LOTENSIN HCT) 20-12.5 MG per tablet TAKE 1 TABLET BY MOUTH DAILY.  90 tablet  1  . mometasone (NASONEX) 50 MCG/ACT nasal spray One spray into nose daily  17 g  1  . OVER THE COUNTER MEDICATION Take 1 tablet by mouth daily. DIGESTIVE ADVANTAGE       . OVER THE COUNTER MEDICATION 1 tablet 2 (two) times daily. PM phytogen      . Red  Yeast Rice Extract (RED YEAST RICE PO) Take 1 tablet by mouth daily.      . temazepam (RESTORIL) 7.5 MG capsule Take 1 capsule (7.5 mg total) by mouth at bedtime as needed for sleep.  30 capsule  1  . traMADol-acetaminophen (ULTRACET) 37.5-325 MG per tablet Take 1 tablet by mouth every 6 (six) hours as needed for pain.  30 tablet  3  . valACYclovir (VALTREX) 1000 MG tablet       . zolpidem (AMBIEN) 10 MG tablet Take 1/2 tablet at bedtime  30 tablet  3    Allergies  Allergen Reactions  . Amitriptyline     intolerance  . Demerol Other (See Comments)    migraines  . Zolpidem Tartrate     intolerance  . Morphine And Related Rash    Family History  Problem Relation Age of Onset  . Diverticulitis Mother   . Colon cancer Maternal Grandmother   . Cancer Daughter   no goiter or other thyroid probs.    BP 118/76  Pulse 77  Temp 98.2 F (36.8 C) (Oral)  Wt 168 lb (76.204 kg)  SpO2 97%  Review of Systems denies weight loss, headache, double vision, palpitations, polyuria, tremor, anxiety, hypoglycemia, and easy bruising.  She reports intermittent hoarseness and doe.  She has diarrhea, rhinorrhea, excessive diaphoresis, numbness of the fingers, and myalgias.      Objective:   Physical Exam VS: see vs page GEN: no distress HEAD: head: no deformity eyes: no periorbital swelling, no proptosis external nose and ears are normal mouth: no lesion seen NECK: i cannot palpate the nodules.   CHEST WALL: no deformity LUNGS:  Clear to auscultation CV: reg rate and rhythm, no murmur ABD: abdomen is soft, nontender.  no hepatosplenomegaly.  not distended.  no hernia MUSCULOSKELETAL: muscle bulk and strength are grossly normal.  no obvious joint swelling.  gait is normal and steady EXTEMITIES: no deformity.  no edema.   PULSES: dorsalis pedis intact bilat.  no carotid bruit NEURO:  cn 2-12 grossly intact.   readily moves all 4's.  sensation is intact to touch on all 4's.  No tremor SKIN:   Normal texture and temperature.  No rash or suspicious lesion is visible.   NODES:  None palpable at the neck PSYCH: alert, oriented x3.  Does not appear anxious nor depressed.   outside test results are reviewed: i reviewed [place] and cytol reports Lab Results  Component Value Date   TSH 0.409 11/30/2011   T4TOTAL 6.8 11/30/2006      Assessment & Plan:  Multinodular goiter, which is usually hereditary Hyperthyroidism, intermittent, due to the goiter.  It is back to normal now, but hyperthyroidism will recur with almost 100% probability. Hoarseness, not thyroid-related

## 2011-12-10 NOTE — Patient Instructions (Addendum)
your "lumpy thyroid" will eventually become overactive again.  this is usually a slow process, happening over the span of many years. A case could be made for taking the radioactive iodine treatment now, because your thyroid has been high in the past.  Please let me know. Otherwise, you should have your thyroid blood test every 6 months.  Either Dr Rudene Anda or i would be happy to check this.

## 2011-12-10 NOTE — Telephone Encounter (Signed)
Ms Solar wants to know your opinion about radioactive iodine tx

## 2011-12-10 NOTE — Telephone Encounter (Signed)
[name]  Call pt and see what the issue is.  If questions you cannot answer  Have her call office Monday when I am here around 11;30 am

## 2011-12-10 NOTE — Telephone Encounter (Signed)
Pt would like to speak with Dr. Suzi Roots about her meeting with the endocrinologist... She wants Dr. Suzi Roots opinion... Please call pt on [phone].Marland KitchenMarland Kitchen

## 2011-12-14 NOTE — Telephone Encounter (Signed)
Spoke with pt and discussed pros and cons of RAI treatment of thyroid  She states she will proceed with treatment after first of year

## 2011-12-22 ENCOUNTER — Other Ambulatory Visit (HOSPITAL_COMMUNITY): Payer: Medicare Other

## 2011-12-31 ENCOUNTER — Ambulatory Visit (HOSPITAL_COMMUNITY)
Admission: RE | Admit: 2011-12-31 | Discharge: 2011-12-31 | Disposition: A | Discharge status: Home / Self Care | Payer: Medicare Other | Source: Ambulatory Visit | Attending: Internal Medicine | Admitting: Internal Medicine

## 2011-12-31 DIAGNOSIS — Z13820 Encounter for screening for osteoporosis: Secondary | ICD-10-CM | POA: Insufficient documentation

## 2011-12-31 DIAGNOSIS — Z780 Asymptomatic menopausal state: Secondary | ICD-10-CM | POA: Insufficient documentation

## 2011-12-31 DIAGNOSIS — M810 Age-related osteoporosis without current pathological fracture: Secondary | ICD-10-CM

## 2012-01-06 ENCOUNTER — Telehealth: Payer: Self-pay | Admitting: Internal Medicine

## 2012-01-06 ENCOUNTER — Encounter: Payer: Self-pay | Admitting: Internal Medicine

## 2012-01-06 NOTE — Telephone Encounter (Signed)
[name]    Call pt and let her know that I have seen her bone density.  She has osteopenia  Not osteoporosis.  No worsening in her hip and a little bit more thinning of bone in her lumbar spine.  She should just take calcium 1200-1500 mg daily along with Vitamin D [phone] units daily

## 2012-01-06 NOTE — Telephone Encounter (Signed)
Left message regarding bone density and medication recommendations per Dr Coralyn Mark

## 2012-01-12 ENCOUNTER — Encounter: Payer: Self-pay | Admitting: *Deleted

## 2012-01-13 ENCOUNTER — Other Ambulatory Visit: Payer: Self-pay | Admitting: *Deleted

## 2012-01-13 ENCOUNTER — Telehealth: Payer: Self-pay | Admitting: *Deleted

## 2012-01-13 NOTE — Telephone Encounter (Signed)
[name]   Let pt know it is too risky to take HT at age of 30.  I can give her non HT options.  I would start her on Lexapro 10 mg daily if she wants.  Message back with respones

## 2012-01-13 NOTE — Telephone Encounter (Signed)
Pt would like to begin her HT again states that she is unable to make an appt until first of the year due to insurance and her hot flushes are becoming unbearable and wants to know if we can start her on hormones again without an appt. She will come in for an office visit in Jan

## 2012-01-13 NOTE — Telephone Encounter (Signed)
Pt is experiencing severe hot flushes and would like to begin her HT again states that she has been off for one year and sx are worsening. States that due to her insurance she is unable to make another appt until Jan 1. Pt wants [place] to call in her previous RX

## 2012-01-13 NOTE — Telephone Encounter (Signed)
Pt states that she has been taking her Hormones since 1977 and she rather not take any thing else She will make an appt with you in Jan to also check her thyroid funtion

## 2012-01-14 NOTE — Telephone Encounter (Signed)
[name]  Call pt and let her know if she wants to see Dr. Cletis Media (her fomer GYN) for HT she can call her.  I will not be giving her Hormones

## 2012-02-04 ENCOUNTER — Encounter: Payer: Self-pay | Admitting: Internal Medicine

## 2012-02-04 ENCOUNTER — Telehealth: Payer: Self-pay | Admitting: Endocrinology

## 2012-02-04 ENCOUNTER — Ambulatory Visit (INDEPENDENT_AMBULATORY_CARE_PROVIDER_SITE_OTHER): Payer: Medicare Other | Admitting: Internal Medicine

## 2012-02-04 VITALS — BP 130/78 | HR 84 | Temp 97.8°F | Wt 169.5 lb

## 2012-02-04 DIAGNOSIS — I10 Essential (primary) hypertension: Secondary | ICD-10-CM

## 2012-02-04 DIAGNOSIS — E042 Nontoxic multinodular goiter: Secondary | ICD-10-CM

## 2012-02-04 DIAGNOSIS — Z780 Asymptomatic menopausal state: Secondary | ICD-10-CM

## 2012-02-04 DIAGNOSIS — M1990 Unspecified osteoarthritis, unspecified site: Secondary | ICD-10-CM

## 2012-02-04 DIAGNOSIS — G4700 Insomnia, unspecified: Secondary | ICD-10-CM

## 2012-02-04 MED ORDER — TRAMADOL-ACETAMINOPHEN 37.5-325 MG PO TABS: 1.0000 | ORAL_TABLET | Freq: Four times a day (QID) | ORAL | Status: DC | PRN

## 2012-02-04 MED ORDER — CLONIDINE HCL 0.1 MG PO TABS: ORAL_TABLET | ORAL | Status: DC

## 2012-02-04 MED ORDER — ZOLPIDEM TARTRATE 10 MG PO TABS: ORAL_TABLET | ORAL | Status: DC

## 2012-02-04 MED ORDER — BENAZEPRIL-HYDROCHLOROTHIAZIDE 20-12.5 MG PO TABS: ORAL_TABLET | ORAL | Status: DC

## 2012-02-04 NOTE — Progress Notes (Signed)
Subjective:    [name], female    DOB: Jul 03, 1940, 72 y.o.   MRN: [id number]  HPI [name] is here to follow up on several issues  She has decided to have RAI treatment for her goiter.  See Dr. Cordelia Pen note  She is having bothersome night sweats since stopping her HT.    Ultracet is taking care of her DJD pain  Low dose Ambien at 5 mg helps her insomnia quite a bit  Allergies  Allergen Reactions  . Amitriptyline     intolerance  . Demerol Other (See Comments)    migraines  . Zolpidem Tartrate     Intolerance at 10 mg dose  . Morphine And Related Rash   Past Medical History  Diagnosis Date  . Hypertension   . Hyperthyroidism     nodule on thyroid, no meds.  . Arthritis     knees  . Depression   . Diverticulitis    Past Surgical History  Procedure Date  . Bladder surgery     bladder suspension  . Abdominal hysterectomy 1977  . Total knee revision 12/17/2010    Procedure: TOTAL KNEE REVISION;  Surgeon: Dione Plover Aluisio;  Location: [facility];  Service: Orthopedics;  Laterality: Right;  . Joint replacement 2009    total knee replacement  . Colon surgery 3 years ago    colonoscopy  . Rotator cuff repair    History   Social History  . Marital Status: Married    Spouse Name: N/A    Number of Children: N/A  . Years of Education: N/A   Occupational History  . Not on file.   Social History Main Topics  . Smoking status: Never Smoker   . Smokeless tobacco: Never Used  . Alcohol Use: No  . Drug Use: No  . Sexually Active: Not Currently   Other Topics Concern  . Not on file   Social History Narrative  . No narrative on file   Family History  Problem Relation Age of Onset  . Diverticulitis Mother   . Colon cancer Maternal Grandmother   . Cancer Daughter    Patient Active Problem List  Diagnosis  . Diverticulitis of sigmoid colon  . Anxiety  . Essential hypertension, benign  . Menopause  . Osteoporosis  . DJD (degenerative joint  disease)  . History of anemia  . History of herpes simplex infection  . Insomnia  . S/P hysterectomy  . Multinodular goiter  . Myalgia  . GERD (gastroesophageal reflux disease)   Current Outpatient Prescriptions on File Prior to Visit  Medication Sig Dispense Refill  . OVER THE COUNTER MEDICATION Take 1 tablet by mouth daily. DIGESTIVE ADVANTAGE       . Red Yeast Rice Extract (RED YEAST RICE PO) Take 1 tablet by mouth daily.      . valACYclovir (VALTREX) 1000 MG tablet as needed.       . benazepril-hydrochlorthiazide (LOTENSIN HCT) 20-12.5 MG per tablet Take 1/2 tablet daily  30 tablet  2  . cloNIDine (CATAPRES) 0.1 MG tablet Take 1/2 tablet at hs  30 tablet  1  . mometasone (NASONEX) 50 MCG/ACT nasal spray One spray into nose daily  17 g  1  . OVER THE COUNTER MEDICATION 1 tablet 2 (two) times daily. PM phytogen      . temazepam (RESTORIL) 7.5 MG capsule Take 1 capsule (7.5 mg total) by mouth at bedtime as needed for sleep.  30 capsule  1  Review of Systems    see HPI Objective:   Physical Exam Physical Exam  Nursing note and vitals reviewed.  Constitutional: She is oriented to person, place, and time. She appears well-developed and well-nourished.  HENT:  Head: Normocephalic and atraumatic.  Cardiovascular: Normal rate and regular rhythm. Exam reveals no gallop and no friction rub.  No murmur heard.  Pulmonary/Chest: Breath sounds normal. She has no wheezes. She has no rales.  Neurological: She is alert and oriented to person, place, and time.  Skin: Skin is warm and dry.  Psychiatric: She has a normal mood and affect. Her behavior is normal. Ext:  No edema             Assessment & Plan:  MNG:  Pt will make appt with Dr. Loanne Drilling as she want to initiate RAI   Night sweats  She is euthyroid now.  Will empirically try 1/2 of Clonidine 1 mg at night.  She is to see me in 8 weeks.  Will adjust her BP med dose  Counseled to get up slowly next 2-3 weeks  HTN   Well controlled.  I will adjust her Benazepril/HCTZ to 1/2 pill daily since I am adding Clonidine at night  DJD  Continue Ultracet  Insomnia  OK with ambien 5 mg at night.    See me in 8 weeks

## 2012-02-04 NOTE — Telephone Encounter (Signed)
Pt advised and states an understanding 

## 2012-02-04 NOTE — Progress Notes (Signed)
Here to discuss thyroid radiation. Also reports that received letter from insurance they will not cover ambien except for  A 90 day supply and they recommend alternatives that are covered

## 2012-02-04 NOTE — Telephone Encounter (Signed)
First step is nuc med thyroid x-ray.  i have ordered Based on this result, i can order i-131 rx

## 2012-02-04 NOTE — Patient Instructions (Addendum)
See me in 8 weeks  

## 2012-02-04 NOTE — Telephone Encounter (Signed)
The patient called and stated that she would like to do the radioactive iodine treatment for her thyroid nodules and would like Dr. Loanne Drilling to order this for her.  The patient may be reached at [phone].

## 2012-02-10 ENCOUNTER — Telehealth: Payer: Self-pay | Admitting: *Deleted

## 2012-02-10 ENCOUNTER — Other Ambulatory Visit: Payer: Self-pay | Admitting: Internal Medicine

## 2012-02-10 ENCOUNTER — Telehealth: Payer: Self-pay | Admitting: Endocrinology

## 2012-02-10 MED ORDER — TRAMADOL-ACETAMINOPHEN 37.5-325 MG PO TABS: 1.0000 | ORAL_TABLET | Freq: Four times a day (QID) | ORAL | Status: DC | PRN

## 2012-02-10 MED ORDER — ZOLPIDEM TARTRATE 5 MG PO TABS: 5.0000 mg | ORAL_TABLET | Freq: Every evening | ORAL | Status: DC | PRN

## 2012-02-10 NOTE — Telephone Encounter (Signed)
The patient called to check status of nuclear medicine thyroid scan referral.  Please call patient when information is available at [phone].

## 2012-02-11 ENCOUNTER — Telehealth: Payer: Self-pay | Admitting: Internal Medicine

## 2012-02-11 NOTE — Telephone Encounter (Signed)
Pt states she is experienced her blood pressure at a high last night.. She took one of blood pressure pills and it finally went down.. Pt would like to discuss this with Dr. Suzi Roots.. Please call pt at [phone].. Ad

## 2012-02-11 NOTE — Telephone Encounter (Signed)
Verified pharmacy.

## 2012-02-12 ENCOUNTER — Other Ambulatory Visit: Payer: Self-pay | Admitting: *Deleted

## 2012-02-18 ENCOUNTER — Ambulatory Visit (INDEPENDENT_AMBULATORY_CARE_PROVIDER_SITE_OTHER): Payer: Medicare Other | Admitting: Internal Medicine

## 2012-02-18 ENCOUNTER — Encounter: Payer: Self-pay | Admitting: Internal Medicine

## 2012-02-18 VITALS — BP 144/76 | HR 92 | Temp 97.8°F | Resp 16 | Wt 167.0 lb

## 2012-02-18 DIAGNOSIS — Z780 Asymptomatic menopausal state: Secondary | ICD-10-CM

## 2012-02-18 DIAGNOSIS — I10 Essential (primary) hypertension: Secondary | ICD-10-CM

## 2012-02-18 NOTE — Telephone Encounter (Signed)
Returned called regarding BP medications will make an appt.

## 2012-02-18 NOTE — Progress Notes (Signed)
Subjective:    [name], female    DOB: [birth year], 72 y.o.   MRN: [id number]  HPI [name] is here for acute visit.  She had taken 3 clonidine pills , they helped her night sweats but her BP went to 160 and this made her nervous so she went back to the whole pill of Lotensin HCTZ      She is asymptomatic now.  She would still like to try the clonidine as it helped her night sweats  Allergies  Allergen Reactions  . Amitriptyline     intolerance  . Demerol Other (See Comments)    migraines  . Zolpidem Tartrate     Intolerance at 10 mg dose  . Morphine And Related Rash   Past Medical History  Diagnosis Date  . Hypertension   . Hyperthyroidism     nodule on thyroid, no meds.  . Arthritis     knees  . Depression   . Diverticulitis    Past Surgical History  Procedure Date  . Bladder surgery     bladder suspension  . Abdominal hysterectomy 1977  . Total knee revision 12/17/2010    Procedure: TOTAL KNEE REVISION;  Surgeon: Dione Plover Aluisio;  Location: [facility];  Service: Orthopedics;  Laterality: Right;  . Joint replacement 2009    total knee replacement  . Colon surgery 3 years ago    colonoscopy  . Rotator cuff repair    History   Social History  . Marital Status: Married    Spouse Name: N/A    Number of Children: N/A  . Years of Education: N/A   Occupational History  . Not on file.   Social History Main Topics  . Smoking status: Never Smoker   . Smokeless tobacco: Never Used  . Alcohol Use: No  . Drug Use: No  . Sexually Active: Not Currently   Other Topics Concern  . Not on file   Social History Narrative  . No narrative on file   Family History  Problem Relation Age of Onset  . Diverticulitis Mother   . Colon cancer Maternal Grandmother   . Cancer Daughter    Patient Active Problem List  Diagnosis  . Diverticulitis of sigmoid colon  . Anxiety  . Essential hypertension, benign  . Menopause  . Osteoporosis  . DJD (degenerative  joint disease)  . History of anemia  . History of herpes simplex infection  . Insomnia  . S/P hysterectomy  . Multinodular goiter  . Myalgia  . GERD (gastroesophageal reflux disease)   Current Outpatient Prescriptions on File Prior to Visit  Medication Sig Dispense Refill  . benazepril-hydrochlorthiazide (LOTENSIN HCT) 20-12.5 MG per tablet Take 1/2 tablet daily  30 tablet  2  . mometasone (NASONEX) 50 MCG/ACT nasal spray One spray into nose daily  17 g  1  . OVER THE COUNTER MEDICATION Take 1 tablet by mouth daily. DIGESTIVE ADVANTAGE       . Red Yeast Rice Extract (RED YEAST RICE PO) Take 1 tablet by mouth daily.      . traMADol-acetaminophen (ULTRACET) 37.5-325 MG per tablet Take 1 tablet by mouth every 6 (six) hours as needed for pain.  30 tablet  5  . zolpidem (AMBIEN) 5 MG tablet Take 1 tablet (5 mg total) by mouth at bedtime as needed for sleep.  30 tablet  2  . cloNIDine (CATAPRES) 0.1 MG tablet Take 1/2 tablet at hs  30 tablet  1  .  OVER THE COUNTER MEDICATION 1 tablet 2 (two) times daily. PM phytogen      . temazepam (RESTORIL) 7.5 MG capsule Take 1 capsule (7.5 mg total) by mouth at bedtime as needed for sleep.  30 capsule  1  . valACYclovir (VALTREX) 1000 MG tablet as needed.            Review of Systems    see HPI Objective:   Physical Exam Physical Exam  Nursing note and vitals reviewed.  Constitutional: She is oriented to person, place, and time. She appears well-developed and well-nourished.  HENT:  Head: Normocephalic and atraumatic.  Cardiovascular: Normal rate and regular rhythm. Exam reveals no gallop and no friction rub.  No murmur heard.  Pulmonary/Chest: Breath sounds normal. She has no wheezes. She has no rales.  Neurological: She is alert and oriented to person, place, and time.  Skin: Skin is warm and dry.  Psychiatric: She has a normal mood and affect. Her behavior is normal.             Assessment & Plan:  htn  Ok to continue one whole  lotensin/HCTZ  Daily  Night sweats will try very low  Dose clonidine 0.1 mg  1/2 tablet 3 times a week M,W,F  Pt is to call me if any problems

## 2012-02-18 NOTE — Patient Instructions (Signed)
Call me if any problems

## 2012-03-03 ENCOUNTER — Encounter (HOSPITAL_COMMUNITY)
Admission: RE | Admit: 2012-03-03 | Discharge: 2012-03-03 | Disposition: A | Discharge status: Home / Self Care | Payer: Medicare Other | Source: Ambulatory Visit | Attending: Endocrinology | Admitting: Endocrinology

## 2012-03-03 DIAGNOSIS — E042 Nontoxic multinodular goiter: Secondary | ICD-10-CM

## 2012-03-04 ENCOUNTER — Other Ambulatory Visit: Payer: Self-pay | Admitting: Endocrinology

## 2012-03-04 ENCOUNTER — Encounter (HOSPITAL_COMMUNITY): Payer: Self-pay

## 2012-03-04 ENCOUNTER — Encounter (HOSPITAL_COMMUNITY)
Admission: RE | Admit: 2012-03-04 | Discharge: 2012-03-04 | Disposition: A | Discharge status: Home / Self Care | Payer: Medicare Other | Source: Ambulatory Visit | Attending: Endocrinology | Admitting: Endocrinology

## 2012-03-04 DIAGNOSIS — E042 Nontoxic multinodular goiter: Secondary | ICD-10-CM

## 2012-03-04 MED ORDER — SODIUM IODIDE I 131 CAPSULE
7.0000 | Freq: Once | INTRAVENOUS | Status: AC | PRN
  Administered 2012-03-03: 7 via ORAL

## 2012-03-04 MED ORDER — SODIUM PERTECHNETATE TC 99M INJECTION
10.3000 | Freq: Once | INTRAVENOUS | Status: AC | PRN
  Administered 2012-03-04: 10.3 via INTRAVENOUS

## 2012-03-07 ENCOUNTER — Telehealth: Payer: Self-pay | Admitting: Internal Medicine

## 2012-03-07 NOTE — Telephone Encounter (Signed)
Pt states she knows she has an UTI... She states it burns, smells a little funky... She would like to have Microdentin (misspelled) sent to [place]...  If there any questions please feel free to call pt at [phone].Marland KitchenMarland Kitchen

## 2012-03-07 NOTE — Telephone Encounter (Signed)
Returned pt call left message awaiting return call

## 2012-03-18 ENCOUNTER — Other Ambulatory Visit: Payer: Self-pay | Admitting: Internal Medicine

## 2012-03-18 ENCOUNTER — Encounter (HOSPITAL_COMMUNITY): Payer: Self-pay

## 2012-03-18 ENCOUNTER — Encounter (HOSPITAL_COMMUNITY)
Admission: RE | Admit: 2012-03-18 | Discharge: 2012-03-18 | Disposition: A | Discharge status: Home / Self Care | Payer: Medicare Other | Source: Ambulatory Visit | Attending: Endocrinology | Admitting: Endocrinology

## 2012-03-18 DIAGNOSIS — E042 Nontoxic multinodular goiter: Secondary | ICD-10-CM

## 2012-03-18 MED ORDER — SODIUM IODIDE I 131 CAPSULE
31.1000 | Freq: Once | INTRAVENOUS | Status: AC | PRN
  Administered 2012-03-18: 31.1 via ORAL

## 2012-03-18 NOTE — Telephone Encounter (Signed)
Refill request

## 2012-04-14 ENCOUNTER — Telehealth: Payer: Self-pay | Admitting: Endocrinology

## 2012-04-14 ENCOUNTER — Telehealth: Payer: Self-pay | Admitting: *Deleted

## 2012-04-14 NOTE — Telephone Encounter (Signed)
Having treatment w/ radioactive iodine capsule, throat is sore. Is this normal. Please call - [phone] / Venida Jarvis

## 2012-04-14 NOTE — Telephone Encounter (Signed)
The pill left you body just a few days after you took it, so you should approach the sore throat as a different problem.

## 2012-04-14 NOTE — Telephone Encounter (Signed)
Pt called stating she is having treatment with radioactive iodine capsule and her throat is sore. Is this normal? Please advise.

## 2012-04-15 ENCOUNTER — Telehealth: Payer: Self-pay | Admitting: *Deleted

## 2012-04-15 NOTE — Telephone Encounter (Signed)
Called pt and LVM advising her per Dr Loanne Drilling, that the pill left her body just a few days after she took it, so she should approach the sore throat as a different problem. Advised pt to see her PCP if the sore throat continues.

## 2012-04-27 ENCOUNTER — Other Ambulatory Visit (INDEPENDENT_AMBULATORY_CARE_PROVIDER_SITE_OTHER): Payer: Medicare Other | Admitting: *Deleted

## 2012-04-27 VITALS — BP 149/79 | HR 104 | Temp 97.8°F | Resp 16

## 2012-04-27 DIAGNOSIS — R3989 Other symptoms and signs involving the genitourinary system: Secondary | ICD-10-CM

## 2012-04-27 DIAGNOSIS — R399 Unspecified symptoms and signs involving the genitourinary system: Secondary | ICD-10-CM

## 2012-04-27 DIAGNOSIS — R319 Hematuria, unspecified: Secondary | ICD-10-CM

## 2012-04-27 LAB — POCT URINALYSIS DIPSTICK
Bilirubin, UA: NEGATIVE
Nitrite, UA: NEGATIVE
Protein, UA: NEGATIVE
Urobilinogen, UA: NEGATIVE
pH, UA: 7

## 2012-04-27 MED ORDER — CIPROFLOXACIN HCL 500 MG PO TABS: 500.0000 mg | ORAL_TABLET | Freq: Two times a day (BID) | ORAL | Status: DC

## 2012-04-27 NOTE — Progress Notes (Signed)
Pt presents to office with pain burning and itching as well as lower abd pain and pressure pt reports that she has a hx of UTI and this feels similar to previous UTI's. Pt also concerned that it may be "a stone moving". Dr Coralyn Mark notified and spoke with pt. Will order abx as prescribed by Dr Coralyn Mark and will send cx Pt will follow up next week for a repeat UA. Advised pt that if sx worsen  She develops fever or she experiences sever pain to follow up in ED. Pt inquired about AZO OTC advised pt that she may use AZO for symptoms as directed for no more than 3 days

## 2012-04-28 LAB — URINE CULTURE
Colony Count: NO GROWTH
Organism ID, Bacteria: NO GROWTH

## 2012-05-03 ENCOUNTER — Ambulatory Visit: Payer: Medicare Other | Admitting: Internal Medicine

## 2012-05-04 ENCOUNTER — Encounter (INDEPENDENT_AMBULATORY_CARE_PROVIDER_SITE_OTHER): Payer: Medicare Other | Admitting: Ophthalmology

## 2012-05-04 DIAGNOSIS — H2510 Age-related nuclear cataract, unspecified eye: Secondary | ICD-10-CM

## 2012-05-04 DIAGNOSIS — H3560 Retinal hemorrhage, unspecified eye: Secondary | ICD-10-CM

## 2012-05-04 DIAGNOSIS — H43819 Vitreous degeneration, unspecified eye: Secondary | ICD-10-CM

## 2012-05-04 DIAGNOSIS — I10 Essential (primary) hypertension: Secondary | ICD-10-CM

## 2012-05-04 DIAGNOSIS — H35039 Hypertensive retinopathy, unspecified eye: Secondary | ICD-10-CM

## 2012-05-09 ENCOUNTER — Ambulatory Visit (INDEPENDENT_AMBULATORY_CARE_PROVIDER_SITE_OTHER): Payer: Medicare Other | Admitting: Internal Medicine

## 2012-05-09 ENCOUNTER — Encounter: Payer: Self-pay | Admitting: Internal Medicine

## 2012-05-09 VITALS — BP 114/64 | HR 88 | Temp 97.4°F | Resp 18 | Wt 166.0 lb

## 2012-05-09 DIAGNOSIS — IMO0001 Reserved for inherently not codable concepts without codable children: Secondary | ICD-10-CM

## 2012-05-09 DIAGNOSIS — Z87440 Personal history of urinary (tract) infections: Secondary | ICD-10-CM

## 2012-05-09 DIAGNOSIS — N390 Urinary tract infection, site not specified: Secondary | ICD-10-CM

## 2012-05-09 DIAGNOSIS — M791 Myalgia, unspecified site: Secondary | ICD-10-CM

## 2012-05-09 LAB — POCT URINALYSIS DIPSTICK
Blood, UA: NEGATIVE
Glucose, UA: NEGATIVE
Spec Grav, UA: 1.02
Urobilinogen, UA: NEGATIVE

## 2012-05-09 NOTE — Patient Instructions (Signed)
See me as needed 

## 2012-05-09 NOTE — Progress Notes (Signed)
Subjective:    [name], female    DOB: [birth year], 72 y.o.   MRN: [id number]  HPI  [name] is here for follow up of dysuria and UTI  Repeat U/A today is normal.   No symptoms.    She has been having muscle pain along lateral left thigh that ends at knee.  No injury or trauma.   No edema at any part of lower leg.  No rash.  Alleve helps  Recent  RAI to her thyroid.  She has follow up with Dr. Loanne Drilling pending  Allergies  Allergen Reactions  . Amitriptyline     intolerance  . Demerol Other (See Comments)    migraines  . Zolpidem Tartrate     Intolerance at 10 mg dose  . Morphine And Related Rash   Past Medical History  Diagnosis Date  . Hypertension   . Hyperthyroidism     nodule on thyroid, no meds.  . Arthritis     knees  . Depression   . Diverticulitis    Past Surgical History  Procedure Laterality Date  . Bladder surgery      bladder suspension  . Abdominal hysterectomy  1977  . Total knee revision  12/17/2010    Procedure: TOTAL KNEE REVISION;  Surgeon: Dione Plover Aluisio;  Location: [facility];  Service: Orthopedics;  Laterality: Right;  . Joint replacement  2009    total knee replacement  . Colon surgery  3 years ago    colonoscopy  . Rotator cuff repair     History   Social History  . Marital Status: Married    Spouse Name: N/A    Number of Children: N/A  . Years of Education: N/A   Occupational History  . Not on file.   Social History Main Topics  . Smoking status: Never Smoker   . Smokeless tobacco: Never Used  . Alcohol Use: No  . Drug Use: No  . Sexually Active: Not Currently   Other Topics Concern  . Not on file   Social History Narrative  . No narrative on file   Family History  Problem Relation Age of Onset  . Diverticulitis Mother   . Colon cancer Maternal Grandmother   . Cancer Daughter    Patient Active Problem List  Diagnosis  . Diverticulitis of sigmoid colon  . Anxiety  . Essential hypertension, benign  .  Menopause  . Osteoporosis  . DJD (degenerative joint disease)  . History of anemia  . History of herpes simplex infection  . Insomnia  . S/P hysterectomy  . Multinodular goiter  . Myalgia  . GERD (gastroesophageal reflux disease)   Current Outpatient Prescriptions on File Prior to Visit  Medication Sig Dispense Refill  . benazepril-hydrochlorthiazide (LOTENSIN HCT) 20-12.5 MG per tablet TAKE 1 TABLET BY MOUTH DAILY.  90 tablet  2  . cloNIDine (CATAPRES) 0.1 MG tablet Take 1/2 tablet at hs  30 tablet  1  . mometasone (NASONEX) 50 MCG/ACT nasal spray One spray into nose daily  17 g  1  . OVER THE COUNTER MEDICATION Take 1 tablet by mouth daily. DIGESTIVE ADVANTAGE       . Red Yeast Rice Extract (RED YEAST RICE PO) Take 1 tablet by mouth daily.      . traMADol-acetaminophen (ULTRACET) 37.5-325 MG per tablet Take 1 tablet by mouth every 6 (six) hours as needed for pain.  30 tablet  5  . zolpidem (AMBIEN) 5 MG tablet Take 1  tablet (5 mg total) by mouth at bedtime as needed for sleep.  30 tablet  2  . OVER THE COUNTER MEDICATION 1 tablet 2 (two) times daily. PM phytogen      . temazepam (RESTORIL) 7.5 MG capsule Take 1 capsule (7.5 mg total) by mouth at bedtime as needed for sleep.  30 capsule  1  . valACYclovir (VALTREX) 1000 MG tablet as needed.        No current facility-administered medications on file prior to visit.        Review of Systems    see HPI Objective:   Physical Exam Physical Exam  Nursing note and vitals reviewed.  Constitutional: She is oriented to person, place, and time. She appears well-developed and well-nourished.  HENT:  Head: Normocephalic and atraumatic.  Cardiovascular: Normal rate and regular rhythm. Exam reveals no gallop and no friction rub.  No murmur heard.  Pulmonary/Chest: Breath sounds normal. She has no wheezes. She has no rales.  Neurological: She is alert and oriented to person, place, and time.  LE  She is point tender over lateral   Thigh.  No eddema  Good pedal pulse N-V intact.   Skin: Skin is warm and dry.  Psychiatric: She has a normal mood and affect. Her behavior is normal.          Assessment & Plan:  UTI  U/A tofday normal  L leg pain  ??  Meralgia paresthetica  OK for OTC Capsacian.  Tyelenol 2 bid    MNG  S/P  XRT

## 2012-06-15 ENCOUNTER — Other Ambulatory Visit: Payer: Self-pay | Admitting: Internal Medicine

## 2012-06-15 ENCOUNTER — Telehealth: Payer: Self-pay | Admitting: *Deleted

## 2012-06-15 NOTE — Telephone Encounter (Signed)
Patient called needing refill of clonidine called into her pharmacy [facility] in [place]

## 2012-06-15 NOTE — Telephone Encounter (Signed)
Refill request

## 2012-07-04 ENCOUNTER — Encounter (INDEPENDENT_AMBULATORY_CARE_PROVIDER_SITE_OTHER): Payer: Medicare Other | Admitting: Ophthalmology

## 2012-07-04 DIAGNOSIS — H2510 Age-related nuclear cataract, unspecified eye: Secondary | ICD-10-CM

## 2012-07-04 DIAGNOSIS — H35039 Hypertensive retinopathy, unspecified eye: Secondary | ICD-10-CM

## 2012-07-04 DIAGNOSIS — H43819 Vitreous degeneration, unspecified eye: Secondary | ICD-10-CM

## 2012-07-04 DIAGNOSIS — I10 Essential (primary) hypertension: Secondary | ICD-10-CM

## 2012-07-06 ENCOUNTER — Other Ambulatory Visit: Payer: Self-pay | Admitting: Internal Medicine

## 2012-07-06 NOTE — Telephone Encounter (Signed)
Refill request

## 2012-07-11 ENCOUNTER — Other Ambulatory Visit: Payer: Self-pay | Admitting: *Deleted

## 2012-07-11 NOTE — Telephone Encounter (Signed)
Refill request

## 2012-07-12 ENCOUNTER — Telehealth: Payer: Self-pay | Admitting: *Deleted

## 2012-07-12 DIAGNOSIS — M2550 Pain in unspecified joint: Secondary | ICD-10-CM

## 2012-07-12 MED ORDER — TRAMADOL-ACETAMINOPHEN 37.5-325 MG PO TABS: 1.0000 | ORAL_TABLET | Freq: Four times a day (QID) | ORAL | Status: DC | PRN

## 2012-07-12 NOTE — Telephone Encounter (Signed)
[name] called stating she thinks she has fibromyalgia and needs to talk to Dr Chauncey Cruel.  She said she is in pain all the time.  She has taken Tramadol, but it does not help.  She says she used to be a morning person and always on the go; now she crawls out of bed to her chair.  She would like a return call.

## 2012-07-12 NOTE — Telephone Encounter (Signed)
Returned pt's phone call regarding her pain pt states that pain is in all her joints and muscles and that she aches all over all day. Asked pt if she is taking  Tramadol, pt states no that she is out, but it doesn't help. Explained that there were no appt available and that I would notify Dr Coralyn Mark.  Would you like me to set up a referral to rhematologist? And she wants maybe a muscle relaxer

## 2012-07-13 ENCOUNTER — Telehealth: Payer: Self-pay | Admitting: Internal Medicine

## 2012-07-13 MED ORDER — CYCLOBENZAPRINE HCL 5 MG PO TABS: ORAL_TABLET | ORAL | Status: DC

## 2012-07-13 NOTE — Telephone Encounter (Signed)
Ok to refer to Dr Estanislado Pandy for arthralgias

## 2012-07-13 NOTE — Telephone Encounter (Signed)
Spoke with pt.  She is having worsening arthralgias and some muscle aches.  Flexeril has helped in the past  Sheis off her Ambien since march as sleeping OK  Will refer to rheumatology  Low dose flexeril OK to try.  Advised to hold med if too drowsy.  She voices understanding

## 2012-07-26 ENCOUNTER — Other Ambulatory Visit: Payer: Self-pay | Admitting: Internal Medicine

## 2012-07-26 ENCOUNTER — Telehealth: Payer: Self-pay | Admitting: *Deleted

## 2012-07-26 NOTE — Telephone Encounter (Signed)
Unable to leave message for pt as VM not set up will attempt again later to let pt know that there are 5 refills on her medication

## 2012-07-26 NOTE — Telephone Encounter (Signed)
Patient is requesting her Tramadol be called into the pharmacy.  She is in a lot of pain.  [facility] [place].

## 2012-08-15 NOTE — Telephone Encounter (Signed)
close

## 2012-09-14 ENCOUNTER — Other Ambulatory Visit: Payer: Self-pay | Admitting: Internal Medicine

## 2012-09-15 ENCOUNTER — Other Ambulatory Visit: Payer: Self-pay | Admitting: *Deleted

## 2012-09-15 ENCOUNTER — Other Ambulatory Visit: Payer: Self-pay | Admitting: Internal Medicine

## 2012-09-15 MED ORDER — TRAMADOL-ACETAMINOPHEN 37.5-325 MG PO TABS: 1.0000 | ORAL_TABLET | Freq: Four times a day (QID) | ORAL | Status: DC | PRN

## 2012-09-15 NOTE — Telephone Encounter (Signed)
Pt needs a refill states that the pharmacy can not refill must be called in or a written order faxed to pharmacy for each refill

## 2012-09-19 ENCOUNTER — Telehealth: Payer: Self-pay | Admitting: *Deleted

## 2012-09-19 NOTE — Telephone Encounter (Signed)
Duplicate request

## 2012-09-19 NOTE — Telephone Encounter (Signed)
Berdina needs a new Rx for Tramadol. Pharmacy told her that it is now a controlled substance and can not be refilled with out new RX.

## 2012-09-20 ENCOUNTER — Other Ambulatory Visit: Payer: Self-pay | Admitting: *Deleted

## 2012-09-20 NOTE — Telephone Encounter (Signed)
[facility]

## 2012-12-06 ENCOUNTER — Other Ambulatory Visit: Payer: Self-pay | Admitting: *Deleted

## 2012-12-06 DIAGNOSIS — Z780 Asymptomatic menopausal state: Secondary | ICD-10-CM

## 2012-12-06 DIAGNOSIS — M810 Age-related osteoporosis without current pathological fracture: Secondary | ICD-10-CM

## 2012-12-06 DIAGNOSIS — Z862 Personal history of diseases of the blood and blood-forming organs and certain disorders involving the immune mechanism: Secondary | ICD-10-CM

## 2012-12-06 DIAGNOSIS — Z139 Encounter for screening, unspecified: Secondary | ICD-10-CM

## 2012-12-06 DIAGNOSIS — M1990 Unspecified osteoarthritis, unspecified site: Secondary | ICD-10-CM

## 2012-12-06 LAB — COMPREHENSIVE METABOLIC PANEL
Albumin: 4.4 g/dL (ref 3.5–5.2)
Alkaline Phosphatase: 78 U/L (ref 39–117)
BUN: 16 mg/dL (ref 6–23)
Calcium: 9.9 mg/dL (ref 8.4–10.5)
Chloride: 98 mEq/L (ref 96–112)
Glucose, Bld: 119 mg/dL — ABNORMAL HIGH (ref 70–99)
Potassium: 3.5 mEq/L (ref 3.5–5.3)

## 2012-12-06 LAB — CBC WITH DIFFERENTIAL/PLATELET
Hemoglobin: 13.9 g/dL (ref 12.0–15.0)
Lymphocytes Relative: 41 % (ref 12–46)
Lymphs Abs: 2.8 10*3/uL (ref 0.7–4.0)
Monocytes Relative: 5 % (ref 3–12)
Neutro Abs: 3.6 10*3/uL (ref 1.7–7.7)
Neutrophils Relative %: 51 % (ref 43–77)
RBC: 4.57 MIL/uL (ref 3.87–5.11)

## 2012-12-06 LAB — LIPID PANEL
Cholesterol: 290 mg/dL — ABNORMAL HIGH (ref 0–200)
HDL: 45 mg/dL (ref >39)
Total CHOL/HDL Ratio: 6.4 Ratio

## 2012-12-06 LAB — TSH: TSH: 113.912 u[IU]/mL — ABNORMAL HIGH (ref 0.350–4.500)

## 2012-12-09 ENCOUNTER — Encounter: Payer: Self-pay | Admitting: *Deleted

## 2012-12-09 ENCOUNTER — Other Ambulatory Visit: Payer: Self-pay | Admitting: Internal Medicine

## 2012-12-09 DIAGNOSIS — Z139 Encounter for screening, unspecified: Secondary | ICD-10-CM

## 2012-12-12 ENCOUNTER — Encounter: Payer: Self-pay | Admitting: Internal Medicine

## 2012-12-12 ENCOUNTER — Ambulatory Visit (INDEPENDENT_AMBULATORY_CARE_PROVIDER_SITE_OTHER): Payer: Medicare Other | Admitting: Internal Medicine

## 2012-12-12 VITALS — BP 124/76 | HR 82 | Temp 98.6°F | Resp 18 | Wt 169.0 lb

## 2012-12-12 DIAGNOSIS — R922 Inconclusive mammogram: Secondary | ICD-10-CM | POA: Insufficient documentation

## 2012-12-12 DIAGNOSIS — Z139 Encounter for screening, unspecified: Secondary | ICD-10-CM

## 2012-12-12 DIAGNOSIS — E039 Hypothyroidism, unspecified: Secondary | ICD-10-CM

## 2012-12-12 DIAGNOSIS — N6459 Other signs and symptoms in breast: Secondary | ICD-10-CM

## 2012-12-12 DIAGNOSIS — Z0000 Encounter for general adult medical examination without abnormal findings: Secondary | ICD-10-CM

## 2012-12-12 DIAGNOSIS — E042 Nontoxic multinodular goiter: Secondary | ICD-10-CM

## 2012-12-12 DIAGNOSIS — I10 Essential (primary) hypertension: Secondary | ICD-10-CM

## 2012-12-12 DIAGNOSIS — M1990 Unspecified osteoarthritis, unspecified site: Secondary | ICD-10-CM

## 2012-12-12 LAB — POCT URINALYSIS DIPSTICK
Bilirubin, UA: NEGATIVE
Blood, UA: NEGATIVE
Glucose, UA: NEGATIVE
Ketones, UA: NEGATIVE
Leukocytes, UA: NEGATIVE
Nitrite, UA: NEGATIVE
Protein, UA: NEGATIVE
Spec Grav, UA: 1.01
Urobilinogen, UA: NEGATIVE
pH, UA: 6.5

## 2012-12-12 LAB — HEMOCCULT GUIAC POC 1CARD (OFFICE): Fecal Occult Blood, POC: NEGATIVE

## 2012-12-12 MED ORDER — LEVOTHYROXINE SODIUM 50 MCG PO TABS: ORAL_TABLET | ORAL | Status: DC

## 2012-12-12 NOTE — Progress Notes (Signed)
Subjective:    [name], female    DOB: [birth year], 72 y.o.   MRN: [id number]  HPI  [name] is here for CPE  See mm  She has extreme breast density and a daughter with breast cancer she also has paternal cousin with breast cancer.  She does not report any masses on self exam.    She is due for a colonoscopy with Dr. Collene Mares and pt advised to call to set up appt  See TSH  She is S/P thyroid XRT for hyperthyroidism  And is now hypothyroid.  She notes she has gained weight , is fatigued,  And skin quite dry.  She has gained 3#  She does not have a follow up with her.  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    Allergies  Allergen Reactions  . Amitriptyline     intolerance  . Demerol Other (See Comments)    migraines  . Zolpidem Tartrate     Intolerance at 10 mg dose  . Morphine And Related Rash   Past Medical History  Diagnosis Date  . Hypertension   . Hyperthyroidism     nodule on thyroid, no meds.  . Arthritis     knees  . Depression   . Diverticulitis    Past Surgical History  Procedure Laterality Date  . Bladder surgery      bladder suspension  . Abdominal hysterectomy  1977  . Total knee revision  12/17/2010    Procedure: TOTAL KNEE REVISION;  Surgeon: Dione Plover Aluisio;  Location: [facility];  Service: Orthopedics;  Laterality: Right;  . Joint replacement  2009    total knee replacement  . Colon surgery  3 years ago    colonoscopy  . Rotator cuff repair     History   Social History  . Marital Status: Married    Spouse Name: N/A    Number of Children: N/A  . Years of Education: N/A   Occupational History  . Not on file.   Social History Main Topics  . Smoking status: Never Smoker   . Smokeless tobacco: Never Used  .  Alcohol Use: No  . Drug Use: No  . Sexual Activity: Not Currently   Other Topics Concern  . Not on file   Social History Narrative  . No narrative on file   Family History  Problem Relation Age of Onset  . Diverticulitis Mother   . Colon cancer Maternal Grandmother   . Cancer Daughter    Patient Active Problem List   Diagnosis Date Noted  . Hypothyroidism 12/12/2012  . Breast density 12/12/2012  . S/P hysterectomy 11/30/2011  . Multinodular goiter 11/30/2011  . Myalgia 11/30/2011  . GERD (gastroesophageal reflux disease) 11/30/2011  . Insomnia 07/23/2011  . History of anemia 05/10/2011  . History of herpes simplex infection 05/10/2011  . Anxiety 05/04/2011  . Essential hypertension, benign 05/04/2011  . Menopause 05/04/2011  . Osteoporosis 05/04/2011  . DJD (degenerative joint disease) 05/04/2011  . Diverticulitis of sigmoid colon 07/29/2010   Current Outpatient Prescriptions on File Prior to Visit  Medication Sig Dispense Refill  . benazepril-hydrochlorthiazide (LOTENSIN HCT) 20-12.5 MG per tablet TAKE 1 TABLET BY MOUTH DAILY.  90 tablet  2  . cloNIDine (CATAPRES) 0.1 MG tablet TAKE 1/2 TABLET AT BEDTIME  30 tablet  3  . OVER THE COUNTER MEDICATION Take 1 tablet by mouth daily. DIGESTIVE ADVANTAGE       . OVER THE COUNTER MEDICATION 1 tablet 2 (two) times daily. PM phytogen      . Red Yeast Rice Extract (RED YEAST RICE PO) Take 1 tablet by mouth daily.      . traMADol-acetaminophen (ULTRACET) 37.5-325 MG per tablet Take 1 tablet by mouth every 6 (six) hours as needed for pain.  30 tablet  5  . valACYclovir (VALTREX) 1000 MG tablet as needed.       . cyclobenzaprine (FLEXERIL) 5 MG tablet Take one tablet bid prn for muscle spasm  30 tablet  1  . mometasone (NASONEX) 50 MCG/ACT nasal spray One spray into nose daily  17 g  1  . temazepam (RESTORIL) 7.5 MG capsule Take 1 capsule (7.5 mg total) by mouth at bedtime as needed for sleep.  30 capsule  1  . zolpidem (AMBIEN) 5 MG  tablet Take 1 tablet (5 mg total) by mouth at bedtime as needed for sleep.  30 tablet  2   No current facility-administered medications on file prior to visit.        Review of Systems    see HPI Objective:   Physical Exam Physical Exam  Nursing note and vitals reviewed.  Constitutional: She is oriented to person, place, and time. She appears well-developed and well-nourished.  HENT:  Head: Normocephalic and atraumatic.  Right Ear: Tympanic membrane and ear canal normal. No drainage. Tympanic membrane is not injected and not erythematous.  Left Ear: Tympanic membrane and ear canal normal. No drainage. Tympanic membrane is not injected and not erythematous.  Nose: Nose normal. Right sinus exhibits no maxillary sinus tenderness and no frontal sinus tenderness. Left sinus exhibits no maxillary sinus tenderness and no frontal sinus tenderness.  Mouth/Throat: Oropharynx is clear and moist. No oral lesions. No oropharyngeal exudate.  Eyes: Conjunctivae and EOM are normal. Pupils are equal, round, and reactive to light.  Neck: Normal range of motion. Neck supple. No JVD present. Carotid bruit is not present. No mass and no thyromegaly present.  Cardiovascular: Normal rate, regular rhythm, S1 normal, S2 normal and intact distal pulses. Exam reveals no gallop and no friction rub.  No murmur heard.  Pulses:  Carotid pulses are 2+ on the right side, and 2+ on the left side.  Dorsalis pedis pulses are 2+ on the right side, and 2+ on the left side.  No carotid bruit. No LE edema  Pulmonary/Chest: Breath sounds normal. She has no wheezes. She has no rales. She exhibits no tenderness.  Breast no discrete masses no nipple discharge no axillary adenopathy bilaterally Abdominal: Soft. Bowel sounds are normal. She exhibits no distension and no mass. There is no hepatosplenomegaly. There is no tenderness. There is no CVA tenderness.  Musculoskeletal: Normal range of motion.  Rectal no mass guaiac neg.   No active synovitis to joints.  Lymphadenopathy:  She has no cervical adenopathy.  She has no axillary adenopathy.  Right: No inguinal and no supraclavicular adenopathy present.  Left: No inguinal and no supraclavicular adenopathy present.  Neurological: She is alert and oriented to person, place, and time. She has normal strength and normal reflexes. She displays no tremor. No cranial nerve deficit or sensory deficit. Coordination and gait normal.  Skin: Skin is warm and dry. No  rash noted. No cyanosis. Nails show no clubbing.  Psychiatric: She has a normal mood and affect. Her speech is normal and behavior is normal. Cognition and memory are normal.             Assessment & Plan:  Health Maintenance  UTD with vaccines.  Advised to call Dr. Lorie Apley office for screening colonoscopy   HTN  Continue meds  Hyperlipidemia  I discussed the need for statins as the most effective.  I even suggested  Qod statin but she is unwilling  She is quite afraid to take this as her father could not tolerate.  She has been trying red yeast rice  I advised her to discuss this with her endocrinologist at upcoming visit  Post XRT  Hypothyroidism:    Will start Synthroid  25 mcg for 2 weeks then increase to 50 mcg and refer back to her endocrinologist     Extreme breast density and high risk breast CA (FH daughter and paternal cousin)  I spoke with Dr. Joneen Caraway in radiology and he recommended  MRI.  Will check with insurance and pt is to check with Dr. Maureen Ralphs since her R   TKR if she is OK for an MRI.  DJD  Continue meds prn  She has also seen Dr.  Estanislado Pandy in the past

## 2012-12-12 NOTE — Patient Instructions (Signed)
Refer back to Dr. Loanne Drilling  She has seen before Hypothyroidism post  Radiation treatment  See me in 3-4 months  30 mins  Discuss cholesterol medication with Dr. Loanne Drilling

## 2012-12-15 ENCOUNTER — Other Ambulatory Visit: Payer: Self-pay | Admitting: *Deleted

## 2012-12-15 NOTE — Telephone Encounter (Signed)
Refill request

## 2012-12-18 MED ORDER — BENAZEPRIL-HYDROCHLOROTHIAZIDE 20-12.5 MG PO TABS: 1.0000 | ORAL_TABLET | Freq: Every day | ORAL | Status: DC

## 2012-12-26 ENCOUNTER — Encounter: Payer: Self-pay | Admitting: Endocrinology

## 2012-12-26 ENCOUNTER — Ambulatory Visit (INDEPENDENT_AMBULATORY_CARE_PROVIDER_SITE_OTHER): Payer: Medicare Other | Admitting: Endocrinology

## 2012-12-26 VITALS — BP 124/82 | HR 89 | Temp 98.5°F | Ht 63.0 in | Wt 170.2 lb

## 2012-12-26 DIAGNOSIS — E890 Postprocedural hypothyroidism: Secondary | ICD-10-CM

## 2012-12-26 MED ORDER — LEVOTHYROXINE SODIUM 112 MCG PO TABS: 112.0000 ug | ORAL_TABLET | Freq: Every day | ORAL | Status: DC

## 2012-12-26 NOTE — Patient Instructions (Addendum)
i have sent a prescription to your pharmacy, to increase the thyroid pill.   Please come back for a follow-up appointment in 6 weeks.   At some point, we'll be ready to recheck the ultrasound.

## 2012-12-26 NOTE — Progress Notes (Signed)
Subjective:    [name], female    DOB: 03-29-1940, 72 y.o.   MRN: [id number]  HPI In 2007, pt was incidentally noted at the anterior neck to have slight swelling.   She had a bx in 2007, which was benign. she was then followed with serial ultrasounds.  In early 2014, she had I-131 rx, due to intermittently suppressed TSH.  Epic says she was advised to return for f/u, but pt says she did not receive the message.  In late 2014, she was noted to have vastly elevated TSH, and was rx'ed synthroid.  She has moderate hair loss on the head, and assoc fatigue.   Past Medical History  Diagnosis Date  . Hypertension   . Hyperthyroidism     nodule on thyroid, no meds.  . Arthritis     knees  . Depression   . Diverticulitis     Past Surgical History  Procedure Laterality Date  . Bladder surgery      bladder suspension  . Abdominal hysterectomy  1977  . Total knee revision  12/17/2010    Procedure: TOTAL KNEE REVISION;  Surgeon: Dione Plover Aluisio;  Location: [facility];  Service: Orthopedics;  Laterality: Right;  . Joint replacement  2009    total knee replacement  . Colon surgery  3 years ago    colonoscopy  . Rotator cuff repair      History   Social History  . Marital Status: Married    Spouse Name: N/A    Number of Children: N/A  . Years of Education: N/A   Occupational History  . Not on file.   Social History Main Topics  . Smoking status: Never Smoker   . Smokeless tobacco: Never Used  . Alcohol Use: No  . Drug Use: No  . Sexual Activity: Not Currently   Other Topics Concern  . Not on file   Social History Narrative  . No narrative on file    Current Outpatient Prescriptions on File Prior to Visit  Medication Sig Dispense Refill  . benazepril-hydrochlorthiazide (LOTENSIN HCT) 20-12.5 MG per tablet Take 1 tablet by mouth daily.  90 tablet  0  . cloNIDine (CATAPRES) 0.1 MG tablet TAKE 1/2 TABLET AT BEDTIME  30 tablet  3  . cyclobenzaprine (FLEXERIL) 5  MG tablet Take one tablet bid prn for muscle spasm  30 tablet  1  . mometasone (NASONEX) 50 MCG/ACT nasal spray One spray into nose daily  17 g  1  . OVER THE COUNTER MEDICATION Take 1 tablet by mouth daily. DIGESTIVE ADVANTAGE       . OVER THE COUNTER MEDICATION 1 tablet 2 (two) times daily. PM phytogen      . Red Yeast Rice Extract (RED YEAST RICE PO) Take 1 tablet by mouth daily.      . traMADol-acetaminophen (ULTRACET) 37.5-325 MG per tablet Take 1 tablet by mouth every 6 (six) hours as needed for pain.  30 tablet  5  . valACYclovir (VALTREX) 1000 MG tablet as needed.       . zolpidem (AMBIEN) 5 MG tablet Take 1 tablet (5 mg total) by mouth at bedtime as needed for sleep.  30 tablet  2  . temazepam (RESTORIL) 7.5 MG capsule Take 1 capsule (7.5 mg total) by mouth at bedtime as needed for sleep.  30 capsule  1   No current facility-administered medications on file prior to visit.   Allergies  Allergen Reactions  .  Amitriptyline     intolerance  . Demerol Other (See Comments)    migraines  . Zolpidem Tartrate     Intolerance at 10 mg dose  . Morphine And Related Rash   Family History  Problem Relation Age of Onset  . Diverticulitis Mother   . Colon cancer Maternal Grandmother   . Cancer Daughter    BP 124/82  Pulse 89  Temp(Src) 98.5 F (36.9 C) (Oral)  Ht 5\' 3"  (1.6 m)  Wt 170 lb 3 oz (77.197 kg)  BMI 30.16 kg/m2  SpO2 97%  Review of Systems She has edema and constipation.      Objective:   Physical Exam VITAL SIGNS:  See vs page GENERAL: no distress NECK: There is no palpable thyroid enlargement.  No thyroid nodule is palpable.  No palpable lymphadenopathy at the anterior neck.   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  She is now ready for increased rx. Small multinodular goiter.  She should have f/u US in the future.

## 2012-12-27 NOTE — Telephone Encounter (Signed)
Pt has an  appt on 12/18 at 9am at [facility] for a 3D MM pt notified by VM message

## 2012-12-27 NOTE — Telephone Encounter (Signed)
I spoke with with MD that works for [employer]  Dr. Sharman Cheek.  12/28/2010  Discussed case with her and told her that the radiologist is recommending an MRI .  [employer] denied coverage for MRI.    See scanned notice of denial   Pt informed and I advised her to get a 3 D mm  .  Pt voices understanding

## 2013-01-11 ENCOUNTER — Telehealth: Payer: Self-pay | Admitting: *Deleted

## 2013-01-11 NOTE — Telephone Encounter (Signed)
Haizley called and said the clonidine is not working for hot flashes.  She called wanting to know if she needed to keep taking it or not.  Spoke with RN, and she said that if it is not helping she can stop taking and make appt to see doctor for other options.  Patient said she would just wait until next appointment to discuss with doctor.

## 2013-01-12 ENCOUNTER — Ambulatory Visit (HOSPITAL_COMMUNITY): Payer: Medicare Other

## 2013-01-13 ENCOUNTER — Ambulatory Visit (HOSPITAL_COMMUNITY)
Admission: RE | Admit: 2013-01-13 | Discharge: 2013-01-13 | Disposition: A | Discharge status: Home / Self Care | Payer: Medicare Other | Source: Ambulatory Visit | Attending: Internal Medicine | Admitting: Internal Medicine

## 2013-01-13 DIAGNOSIS — Z139 Encounter for screening, unspecified: Secondary | ICD-10-CM

## 2013-01-13 DIAGNOSIS — Z1231 Encounter for screening mammogram for malignant neoplasm of breast: Secondary | ICD-10-CM | POA: Insufficient documentation

## 2013-01-23 ENCOUNTER — Ambulatory Visit (INDEPENDENT_AMBULATORY_CARE_PROVIDER_SITE_OTHER): Payer: Medicare Other | Admitting: Internal Medicine

## 2013-01-23 ENCOUNTER — Encounter: Payer: Self-pay | Admitting: Internal Medicine

## 2013-01-23 VITALS — BP 118/67 | HR 100 | Temp 98.2°F | Resp 18 | Wt 161.0 lb

## 2013-01-23 DIAGNOSIS — R059 Cough, unspecified: Secondary | ICD-10-CM

## 2013-01-23 DIAGNOSIS — R05 Cough: Secondary | ICD-10-CM

## 2013-01-23 LAB — BASIC METABOLIC PANEL
BUN: 24 mg/dL — ABNORMAL HIGH (ref 6–23)
Calcium: 9.6 mg/dL (ref 8.4–10.5)
Chloride: 98 mEq/L (ref 96–112)
Glucose, Bld: 102 mg/dL — ABNORMAL HIGH (ref 70–99)
Sodium: 138 mEq/L (ref 135–145)

## 2013-01-23 LAB — CBC WITH DIFFERENTIAL/PLATELET
Basophils Absolute: 0 10*3/uL (ref 0.0–0.1)
Basophils Relative: 0 % (ref 0–1)
Eosinophils Absolute: 0.1 10*3/uL (ref 0.0–0.7)
Eosinophils Relative: 2 % (ref 0–5)
HCT: 45.3 % (ref 36.0–46.0)
Hemoglobin: 15.7 g/dL — ABNORMAL HIGH (ref 12.0–15.0)
Lymphocytes Relative: 43 % (ref 12–46)
MCH: 30.3 pg (ref 26.0–34.0)
MCHC: 34.7 g/dL (ref 30.0–36.0)
Monocytes Relative: 9 % (ref 3–12)
Neutro Abs: 3.5 10*3/uL (ref 1.7–7.7)
Neutrophils Relative %: 46 % (ref 43–77)
Platelets: 275 10*3/uL (ref 150–400)
RDW: 13.7 % (ref 11.5–15.5)

## 2013-01-23 MED ORDER — PROMETHAZINE HCL 25 MG PO TABS: 25.0000 mg | ORAL_TABLET | Freq: Three times a day (TID) | ORAL | Status: DC | PRN
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
## 2013-01-23 NOTE — Patient Instructions (Signed)
See me in 6 weeks  Keep a diary of any memory issues

## 2013-01-24 ENCOUNTER — Encounter: Payer: Self-pay | Admitting: *Deleted

## 2013-01-30 ENCOUNTER — Ambulatory Visit (INDEPENDENT_AMBULATORY_CARE_PROVIDER_SITE_OTHER): Payer: Medicare PPO | Admitting: Endocrinology

## 2013-01-30 ENCOUNTER — Encounter: Payer: Self-pay | Admitting: Endocrinology

## 2013-01-30 VITALS — BP 122/86 | HR 94 | Temp 97.1°F | Ht 63.0 in | Wt 165.0 lb

## 2013-01-30 DIAGNOSIS — E890 Postprocedural hypothyroidism: Secondary | ICD-10-CM

## 2013-01-30 LAB — TSH: TSH: 1.46 u[IU]/mL (ref 0.35–5.50)

## 2013-01-30 NOTE — Patient Instructions (Addendum)
blood tests are being requested for you today.  We'll contact you with results. Please come back for a follow-up appointment in 6 weeks.   At some point, we'll be ready to recheck the ultrasound.

## 2013-01-30 NOTE — Progress Notes (Signed)
Subjective:    [name], female    DOB: [birth year], 73 y.o.   MRN: [id number]  HPI In 2007, pt was incidentally noted at the anterior neck to have slight swelling.   She had a bx in 2007, which was benign. she was then followed with serial ultrasounds.  In early 2014, she had I-131 rx, due to intermittently suppressed TSH.  Epic says she was advised to return for f/u, but pt says she did not receive the message.  In late 2014, she was noted to have vastly elevated TSH, and was rx'ed synthroid.  Since then, she feels much better.  Past Medical History  Diagnosis Date  . Hypertension   . Hyperthyroidism     nodule on thyroid, no meds.  . Arthritis     knees  . Depression   . Diverticulitis     Past Surgical History  Procedure Laterality Date  . Bladder surgery      bladder suspension  . Abdominal hysterectomy  1977  . Total knee revision  12/17/2010    Procedure: TOTAL KNEE REVISION;  Surgeon: Dione Plover Aluisio;  Location: [facility];  Service: Orthopedics;  Laterality: Right;  . Joint replacement  2009    total knee replacement  . Colon surgery  3 years ago    colonoscopy  . Rotator cuff repair      History   Social History  . Marital Status: Married    Spouse Name: N/A    Number of Children: N/A  . Years of Education: N/A   Occupational History  . Not on file.   Social History Main Topics  . Smoking status: Never Smoker   . Smokeless tobacco: Never Used  . Alcohol Use: No  . Drug Use: No  . Sexual Activity: Not Currently   Other Topics Concern  . Not on file   Social History Narrative  . No narrative on file    Current Outpatient Prescriptions on File Prior to Visit  Medication Sig Dispense Refill  . benazepril-hydrochlorthiazide (LOTENSIN HCT) 20-12.5 MG per tablet TAKE 1 TABLET BY MOUTH DAILY.  90 tablet  2  . cloNIDine (CATAPRES) 0.1 MG tablet TAKE 1/2 TABLET AT BEDTIME  30 tablet  3  . cyclobenzaprine (FLEXERIL) 5 MG tablet Take one  tablet bid prn for muscle spasm  30 tablet  1  . levothyroxine (SYNTHROID, LEVOTHROID) 112 MCG tablet Take 1 tablet (112 mcg total) by mouth daily.  30 tablet  3  . mometasone (NASONEX) 50 MCG/ACT nasal spray One spray into nose daily  17 g  1  . OVER THE COUNTER MEDICATION Take 1 tablet by mouth daily. DIGESTIVE ADVANTAGE       . OVER THE COUNTER MEDICATION 1 tablet 2 (two) times daily. PM phytogen      . promethazine (PHENERGAN) 25 MG tablet Take 1 tablet (25 mg total) by mouth every 8 (eight) hours as needed for nausea or vomiting.  20 tablet  0  . Red Yeast Rice Extract (RED YEAST RICE PO) Take 1 tablet by mouth daily.      . traMADol-acetaminophen (ULTRACET) 37.5-325 MG per tablet Take 1 tablet by mouth every 6 (six) hours as needed for pain.  30 tablet  5  . valACYclovir (VALTREX) 1000 MG tablet as needed.       . zolpidem (AMBIEN) 5 MG tablet Take 1 tablet (5 mg total) by mouth at bedtime as needed for sleep.  30 tablet  2  . temazepam (RESTORIL) 7.5 MG capsule Take 1 capsule (7.5 mg total) by mouth at bedtime as needed for sleep.  30 capsule  1   No current facility-administered medications on file prior to visit.    Allergies  Allergen Reactions  . Amitriptyline     intolerance  . Demerol Other (See Comments)    migraines  . Zolpidem Tartrate     Intolerance at 10 mg dose  . Morphine And Related Rash   Family History  Problem Relation Age of Onset  . Diverticulitis Mother   . Colon cancer Maternal Grandmother   . Cancer Daughter    BP 122/86  Pulse 94  Temp(Src) 97.1 F (36.2 C) (Oral)  Ht 5\' 3"  (1.6 m)  Wt 165 lb (74.844 kg)  BMI 29.24 kg/m2  SpO2 97%  Review of Systems She has lost a few lbs.      Objective:   Physical Exam VITAL SIGNS:  See vs page. GENERAL: no distress. Skin: not diaphoretic Neuro: no tremor.    Lab Results  Component Value Date   TSH 1.46 01/30/2013   T4TOTAL 6.8 11/30/2006      Assessment & Plan:  Post-i-131 hypothyroidism,  well-replaced.

## 2013-02-09 ENCOUNTER — Other Ambulatory Visit: Payer: Self-pay | Admitting: Internal Medicine

## 2013-02-09 NOTE — Telephone Encounter (Signed)
Refill request

## 2013-02-14 ENCOUNTER — Other Ambulatory Visit: Payer: Self-pay | Admitting: *Deleted

## 2013-02-14 DIAGNOSIS — M79643 Pain in unspecified hand: Secondary | ICD-10-CM

## 2013-03-07 ENCOUNTER — Ambulatory Visit (INDEPENDENT_AMBULATORY_CARE_PROVIDER_SITE_OTHER): Payer: Medicare PPO | Admitting: Internal Medicine

## 2013-03-07 ENCOUNTER — Encounter: Payer: Self-pay | Admitting: Internal Medicine

## 2013-03-07 VITALS — BP 126/73 | HR 98 | Temp 98.5°F | Resp 18 | Wt 161.0 lb

## 2013-03-07 DIAGNOSIS — E890 Postprocedural hypothyroidism: Secondary | ICD-10-CM

## 2013-03-07 DIAGNOSIS — G4700 Insomnia, unspecified: Secondary | ICD-10-CM

## 2013-03-07 DIAGNOSIS — I10 Essential (primary) hypertension: Secondary | ICD-10-CM

## 2013-03-07 MED ORDER — TRAMADOL HCL 50 MG PO TABS: 50.0000 mg | ORAL_TABLET | Freq: Three times a day (TID) | ORAL | Status: DC | PRN

## 2013-03-07 NOTE — Progress Notes (Signed)
Subjective:    [name], female    DOB: 04-12-1940, 73 y.o.   MRN: [id number]  HPI  [name] is here for follow up.  Her  Thyroid replacement is now euthyroid and she reports her memory is much better.    Cough is resolved.  Insomnia  :  She is taking unisom  And this is helping  Her ultracet is not as effective for her DJD.  Joints worse with weather.  No swelling or erythema to joints  Allergies  Allergen Reactions  . Amitriptyline     intolerance  . Demerol Other (See Comments)    migraines  . Zolpidem Tartrate     Intolerance at 10 mg dose  . Morphine And Related Rash   Past Medical History  Diagnosis Date  . Hypertension   . Hyperthyroidism     nodule on thyroid, no meds.  . Arthritis     knees  . Depression   . Diverticulitis    Past Surgical History  Procedure Laterality Date  . Bladder surgery      bladder suspension  . Abdominal hysterectomy  1977  . Total knee revision  12/17/2010    Procedure: TOTAL KNEE REVISION;  Surgeon: Dione Plover Aluisio;  Location: [facility];  Service: Orthopedics;  Laterality: Right;  . Joint replacement  2009    total knee replacement  . Colon surgery  3 years ago    colonoscopy  . Rotator cuff repair     History   Social History  . Marital Status: Married    Spouse Name: N/A    Number of Children: N/A  . Years of Education: N/A   Occupational History  . Not on file.   Social History Main Topics  . Smoking status: Never Smoker   . Smokeless tobacco: Never Used  . Alcohol Use: No  . Drug Use: No  . Sexual Activity: Not Currently   Other Topics Concern  . Not on file   Social History Narrative  . No narrative on file   Family History  Problem Relation Age of Onset  . Diverticulitis Mother   . Colon cancer Maternal Grandmother   . Cancer Daughter    Patient Active Problem List   Diagnosis Date Noted  . Other postablative hypothyroidism 12/26/2012  . Breast density 12/12/2012  . S/P  hysterectomy 11/30/2011  . Multinodular goiter 11/30/2011  . Myalgia 11/30/2011  . GERD (gastroesophageal reflux disease) 11/30/2011  . Insomnia 07/23/2011  . History of anemia 05/10/2011  . History of herpes simplex infection 05/10/2011  . Anxiety 05/04/2011  . Essential hypertension, benign 05/04/2011  . Menopause 05/04/2011  . Osteoporosis 05/04/2011  . DJD (degenerative joint disease) 05/04/2011  . Diverticulitis of sigmoid colon 07/29/2010   Current Outpatient Prescriptions on File Prior to Visit  Medication Sig Dispense Refill  . benazepril-hydrochlorthiazide (LOTENSIN HCT) 20-12.5 MG per tablet TAKE 1 TABLET BY MOUTH DAILY.  90 tablet  2  . levothyroxine (SYNTHROID, LEVOTHROID) 112 MCG tablet Take 1 tablet (112 mcg total) by mouth daily.  30 tablet  3  . OVER THE COUNTER MEDICATION Take 1 tablet by mouth daily. DIGESTIVE ADVANTAGE       . OVER THE COUNTER MEDICATION 1 tablet 2 (two) times daily. PM phytogen      . promethazine (PHENERGAN) 25 MG tablet Take 1 tablet (25 mg total) by mouth every 8 (eight) hours as needed for nausea or vomiting.  20 tablet  0  .  Red Yeast Rice Extract (RED YEAST RICE PO) Take 1 tablet by mouth daily.      . cloNIDine (CATAPRES) 0.1 MG tablet TAKE 1/2 TABLET AT BEDTIME  30 tablet  5  . cyclobenzaprine (FLEXERIL) 5 MG tablet Take one tablet bid prn for muscle spasm  30 tablet  1  . mometasone (NASONEX) 50 MCG/ACT nasal spray One spray into nose daily  17 g  1  . temazepam (RESTORIL) 7.5 MG capsule Take 1 capsule (7.5 mg total) by mouth at bedtime as needed for sleep.  30 capsule  1  . valACYclovir (VALTREX) 1000 MG tablet as needed.       . zolpidem (AMBIEN) 5 MG tablet Take 1 tablet (5 mg total) by mouth at bedtime as needed for sleep.  30 tablet  2   No current facility-administered medications on file prior to visit.      Review of Systems See HPI    Objective:   Physical Exam Physical Exam  Nursing note and vitals reviewed.    Constitutional: She is oriented to person, place, and time. She appears well-developed and well-nourished.  HENT:  Head: Normocephalic and atraumatic.  Cardiovascular: Normal rate and regular rhythm. Exam reveals no gallop and no friction rub.  No murmur heard.  Pulmonary/Chest: Breath sounds normal. She has no wheezes. She has no rales.  Neurological: She is alert and oriented to person, place, and time.  Skin: Skin is warm and dry. Joints :  No active synovitis to any joint examined  Psychiatric: She has a normal mood and affect. Her behavior is normal.         Assessment & Plan:  Memory impairment   Improved now that she is euthryroid  Arthralgias  Ok to Ultram 50 mg q6h prn  Insomnia :   Pt does not want restoril.  She is now off Ambien.  OK to take Benadryl 50 mg   At hs

## 2013-03-07 NOTE — Patient Instructions (Addendum)
Diphenhydramine  (Benadryl)  25 mg.    OK to take 50 mg 30 mins prior to bedtime.    See me as needed

## 2013-03-14 ENCOUNTER — Ambulatory Visit: Payer: Medicare PPO | Admitting: Endocrinology

## 2013-03-17 ENCOUNTER — Telehealth: Payer: Self-pay | Admitting: *Deleted

## 2013-03-17 NOTE — Telephone Encounter (Signed)
Pt called leaving message for Dr Coralyn Mark to return her call. Returned pt's call, no answer

## 2013-03-27 ENCOUNTER — Ambulatory Visit (HOSPITAL_BASED_OUTPATIENT_CLINIC_OR_DEPARTMENT_OTHER)
Admission: RE | Admit: 2013-03-27 | Discharge: 2013-03-27 | Disposition: A | Discharge status: Home / Self Care | Payer: Medicare PPO | Source: Ambulatory Visit | Attending: Internal Medicine | Admitting: Internal Medicine

## 2013-03-27 ENCOUNTER — Ambulatory Visit (INDEPENDENT_AMBULATORY_CARE_PROVIDER_SITE_OTHER): Payer: Medicare PPO | Admitting: Internal Medicine

## 2013-03-27 ENCOUNTER — Other Ambulatory Visit: Payer: Self-pay | Admitting: Internal Medicine

## 2013-03-27 ENCOUNTER — Encounter: Payer: Self-pay | Admitting: Internal Medicine

## 2013-03-27 VITALS — BP 121/68 | HR 88 | Temp 98.8°F | Resp 16 | Wt 164.0 lb

## 2013-03-27 DIAGNOSIS — R1011 Right upper quadrant pain: Secondary | ICD-10-CM

## 2013-03-27 DIAGNOSIS — K5790 Diverticulosis of intestine, part unspecified, without perforation or abscess without bleeding: Secondary | ICD-10-CM

## 2013-03-27 DIAGNOSIS — N200 Calculus of kidney: Secondary | ICD-10-CM

## 2013-03-27 DIAGNOSIS — K5730 Diverticulosis of large intestine without perforation or abscess without bleeding: Secondary | ICD-10-CM

## 2013-03-27 DIAGNOSIS — E039 Hypothyroidism, unspecified: Secondary | ICD-10-CM

## 2013-03-27 DIAGNOSIS — R079 Chest pain, unspecified: Secondary | ICD-10-CM

## 2013-03-27 LAB — CBC WITH DIFFERENTIAL/PLATELET
BASOS PCT: 0 % (ref 0–1)
Basophils Absolute: 0 10*3/uL (ref 0.0–0.1)
EOS ABS: 0.2 10*3/uL (ref 0.0–0.7)
EOS PCT: 2 % (ref 0–5)
HEMATOCRIT: 40.4 % (ref 36.0–46.0)
Hemoglobin: 14 g/dL (ref 12.0–15.0)
Lymphocytes Relative: 32 % (ref 12–46)
Lymphs Abs: 2.5 10*3/uL (ref 0.7–4.0)
MCH: 29.4 pg (ref 26.0–34.0)
MCHC: 34.7 g/dL (ref 30.0–36.0)
MCV: 84.7 fL (ref 78.0–100.0)
MONO ABS: 0.5 10*3/uL (ref 0.1–1.0)
MONOS PCT: 7 % (ref 3–12)
NEUTROS ABS: 4.6 10*3/uL (ref 1.7–7.7)
Neutrophils Relative %: 59 % (ref 43–77)
Platelets: 261 10*3/uL (ref 150–400)
RBC: 4.77 MIL/uL (ref 3.87–5.11)
RDW: 13.7 % (ref 11.5–15.5)
WBC: 7.8 10*3/uL (ref 4.0–10.5)

## 2013-03-27 MED ORDER — PANTOPRAZOLE SODIUM 40 MG PO TBEC: 40.0000 mg | DELAYED_RELEASE_TABLET | Freq: Every day | ORAL | Status: DC

## 2013-03-27 NOTE — Patient Instructions (Signed)
To get labs done today  To have abdominal ultrasound done at 3:30 pm  Do not eat or drink for 6 hours before test    Get Protonix  40 mg and take daily  (acid blocker)

## 2013-03-27 NOTE — Progress Notes (Signed)
Subjective:    [name], female    DOB: August 27, 1940, 73 y.o.   MRN: [id number]  HPI  [name] is here for follow up.    See creatinine.  She has known renal calculi   We had increased her ultram to 50 mg .  She notes that she rarely  takes it and will take an Aleve pm at night.    She reports for the last 3 days she has had sharp R sided epigastric and mid right  sided chest pain that radiates to her back.  No SOB, no diaphoresis,  No radiation to left side, left arm, or left jaw.  She does reports lots of heartburn which she takes a Zantac for.  She states the pain is quite different from her renal colic. No N/V   Allergies  Allergen Reactions  . Amitriptyline     intolerance  . Demerol Other (See Comments)    migraines  . Zolpidem Tartrate     Intolerance at 10 mg dose  . Morphine And Related Rash   Past Medical History  Diagnosis Date  . Hypertension   . Hyperthyroidism     nodule on thyroid, no meds.  . Arthritis     knees  . Depression   . Diverticulitis    Past Surgical History  Procedure Laterality Date  . Bladder surgery      bladder suspension  . Abdominal hysterectomy  1977  . Total knee revision  12/17/2010    Procedure: TOTAL KNEE REVISION;  Surgeon: Dione Plover Aluisio;  Location: [facility];  Service: Orthopedics;  Laterality: Right;  . Joint replacement  2009    total knee replacement  . Colon surgery  3 years ago    colonoscopy  . Rotator cuff repair     History   Social History  . Marital Status: Married    Spouse Name: N/A    Number of Children: N/A  . Years of Education: N/A   Occupational History  . Not on file.   Social History Main Topics  . Smoking status: Never Smoker   . Smokeless tobacco: Never Used  . Alcohol Use: No  . Drug Use: No  . Sexual Activity: Not Currently   Other Topics Concern  . Not on file   Social History Narrative  . No narrative on file   Family History  Problem Relation Age of Onset  .  Diverticulitis Mother   . Colon cancer Maternal Grandmother   . Cancer Daughter    Patient Active Problem List   Diagnosis Date Noted  . Other postablative hypothyroidism 12/26/2012  . Breast density 12/12/2012  . S/P hysterectomy 11/30/2011  . Multinodular goiter 11/30/2011  . Myalgia 11/30/2011  . GERD (gastroesophageal reflux disease) 11/30/2011  . Insomnia 07/23/2011  . History of anemia 05/10/2011  . History of herpes simplex infection 05/10/2011  . Anxiety 05/04/2011  . Essential hypertension, benign 05/04/2011  . Menopause 05/04/2011  . Osteoporosis 05/04/2011  . DJD (degenerative joint disease) 05/04/2011  . Diverticulitis of sigmoid colon 07/29/2010   Current Outpatient Prescriptions on File Prior to Visit  Medication Sig Dispense Refill  . benazepril-hydrochlorthiazide (LOTENSIN HCT) 20-12.5 MG per tablet TAKE 1 TABLET BY MOUTH DAILY.  90 tablet  2  . cloNIDine (CATAPRES) 0.1 MG tablet TAKE 1/2 TABLET AT BEDTIME  30 tablet  5  . cyclobenzaprine (FLEXERIL) 5 MG tablet Take one tablet bid prn for muscle spasm  30 tablet  1  .  levothyroxine (SYNTHROID, LEVOTHROID) 112 MCG tablet Take 1 tablet (112 mcg total) by mouth daily.  30 tablet  3  . mometasone (NASONEX) 50 MCG/ACT nasal spray One spray into nose daily  17 g  1  . OVER THE COUNTER MEDICATION Take 1 tablet by mouth daily. DIGESTIVE ADVANTAGE       . OVER THE COUNTER MEDICATION 1 tablet 2 (two) times daily. PM phytogen      . promethazine (PHENERGAN) 25 MG tablet Take 1 tablet (25 mg total) by mouth every 8 (eight) hours as needed for nausea or vomiting.  20 tablet  0  . Red Yeast Rice Extract (RED YEAST RICE PO) Take 1 tablet by mouth daily.      . temazepam (RESTORIL) 7.5 MG capsule Take 1 capsule (7.5 mg total) by mouth at bedtime as needed for sleep.  30 capsule  1  . traMADol (ULTRAM) 50 MG tablet Take 1 tablet (50 mg total) by mouth every 8 (eight) hours as needed.  30 tablet  0  . valACYclovir (VALTREX) 1000 MG  tablet as needed.       . zolpidem (AMBIEN) 5 MG tablet Take 1 tablet (5 mg total) by mouth at bedtime as needed for sleep.  30 tablet  2   No current facility-administered medications on file prior to visit.      Review of Systems See hpi    Objective:   Physical Exam Physical Exam  Nursing note and vitals reviewed.  Constitutional: She is oriented to person, place, and time. She appears well-developed and well-nourished.  HENT:  Head: Normocephalic and atraumatic.  Cardiovascular: Normal rate and regular rhythm. Exam reveals no gallop and no friction rub.  No murmur heard.  Pulmonary/Chest: Breath sounds normal. She has no wheezes. She has no rales. ABd  Soft nondistended.  Pain RUQ   Murphy's sign  Positiive. In RUQ  No peritoneal signs .  Bowel  Sounds positive.    Neurological: She is alert and oriented to person, place, and time.  Skin: Skin is warm and dry.  Psychiatric: She has a normal mood and affect. Her behavior is normal.         Assessment & Plan:  RUQ/ epigastric  Pain  EKG no acute changes  Etiology  Biliary colic versus  Extreme GERD.    Will check CBC, chemistries and amylase.   Will get ultrasound today  GERD  Stop Zantac  Will Rx  protonix  40 mg daily   Elevated  Creatinine  :  Will get renal ultrasound  When abd pain issue resolved  Will see pt in 2 days .  Advised if pain worsening,   N/V, or fever developes  She is to go to ER

## 2013-03-28 ENCOUNTER — Observation Stay (HOSPITAL_COMMUNITY)
Admission: EM | Admit: 2013-03-28 | Discharge: 2013-03-30 | Disposition: A | Discharge status: Home / Self Care | Payer: Medicare PPO | Attending: General Surgery | Admitting: General Surgery

## 2013-03-28 ENCOUNTER — Encounter: Payer: Self-pay | Admitting: *Deleted

## 2013-03-28 ENCOUNTER — Emergency Department (HOSPITAL_COMMUNITY): Payer: Medicare PPO

## 2013-03-28 ENCOUNTER — Observation Stay (HOSPITAL_COMMUNITY): Payer: Medicare PPO

## 2013-03-28 ENCOUNTER — Telehealth: Payer: Self-pay | Admitting: Internal Medicine

## 2013-03-28 ENCOUNTER — Encounter (HOSPITAL_COMMUNITY): Payer: Self-pay | Admitting: Emergency Medicine

## 2013-03-28 DIAGNOSIS — M810 Age-related osteoporosis without current pathological fracture: Secondary | ICD-10-CM | POA: Insufficient documentation

## 2013-03-28 DIAGNOSIS — R1011 Right upper quadrant pain: Secondary | ICD-10-CM

## 2013-03-28 DIAGNOSIS — I10 Essential (primary) hypertension: Secondary | ICD-10-CM | POA: Insufficient documentation

## 2013-03-28 DIAGNOSIS — F411 Generalized anxiety disorder: Secondary | ICD-10-CM | POA: Insufficient documentation

## 2013-03-28 DIAGNOSIS — G4700 Insomnia, unspecified: Secondary | ICD-10-CM | POA: Insufficient documentation

## 2013-03-28 DIAGNOSIS — E890 Postprocedural hypothyroidism: Secondary | ICD-10-CM | POA: Insufficient documentation

## 2013-03-28 DIAGNOSIS — F329 Major depressive disorder, single episode, unspecified: Secondary | ICD-10-CM | POA: Insufficient documentation

## 2013-03-28 DIAGNOSIS — E042 Nontoxic multinodular goiter: Secondary | ICD-10-CM | POA: Insufficient documentation

## 2013-03-28 DIAGNOSIS — K219 Gastro-esophageal reflux disease without esophagitis: Secondary | ICD-10-CM | POA: Insufficient documentation

## 2013-03-28 DIAGNOSIS — IMO0001 Reserved for inherently not codable concepts without codable children: Secondary | ICD-10-CM | POA: Insufficient documentation

## 2013-03-28 DIAGNOSIS — K2100 Gastro-esophageal reflux disease with esophagitis, without bleeding: Secondary | ICD-10-CM

## 2013-03-28 DIAGNOSIS — K828 Other specified diseases of gallbladder: Secondary | ICD-10-CM | POA: Insufficient documentation

## 2013-03-28 DIAGNOSIS — F3289 Other specified depressive episodes: Secondary | ICD-10-CM | POA: Insufficient documentation

## 2013-03-28 DIAGNOSIS — M1990 Unspecified osteoarthritis, unspecified site: Secondary | ICD-10-CM | POA: Insufficient documentation

## 2013-03-28 DIAGNOSIS — K5730 Diverticulosis of large intestine without perforation or abscess without bleeding: Secondary | ICD-10-CM | POA: Insufficient documentation

## 2013-03-28 DIAGNOSIS — N200 Calculus of kidney: Secondary | ICD-10-CM | POA: Insufficient documentation

## 2013-03-28 DIAGNOSIS — K819 Cholecystitis, unspecified: Principal | ICD-10-CM | POA: Insufficient documentation

## 2013-03-28 DIAGNOSIS — Z780 Asymptomatic menopausal state: Secondary | ICD-10-CM | POA: Insufficient documentation

## 2013-03-28 HISTORY — DX: Diverticulosis of intestine, part unspecified, without perforation or abscess without bleeding: K57.90

## 2013-03-28 HISTORY — DX: Gastro-esophageal reflux disease without esophagitis: K21.9

## 2013-03-28 HISTORY — DX: Hypothyroidism due to medicaments and other exogenous substances: E03.2

## 2013-03-28 LAB — COMPREHENSIVE METABOLIC PANEL
ALBUMIN: 4.2 g/dL (ref 3.5–5.2)
ALBUMIN: 3.6 g/dL (ref 3.5–5.2)
ALK PHOS: 79 U/L (ref 39–117)
ALT: 15 U/L (ref 0–35)
ALT: 15 U/L (ref 0–35)
AST: 15 U/L (ref 0–37)
AST: 15 U/L (ref 0–37)
Alkaline Phosphatase: 71 U/L (ref 39–117)
BUN: 16 mg/dL (ref 6–23)
BUN: 15 mg/dL (ref 6–23)
CALCIUM: 9.7 mg/dL (ref 8.4–10.5)
CALCIUM: 9.7 mg/dL (ref 8.4–10.5)
CO2: 28 mEq/L (ref 19–32)
CO2: 22 mEq/L (ref 19–32)
CREATININE: 0.96 mg/dL (ref 0.50–1.10)
Chloride: 99 mEq/L (ref 96–112)
Chloride: 100 mEq/L (ref 96–112)
Creatinine, Ser: 0.88 mg/dL (ref 0.50–1.10)
GFR calc non Af Amer: 65 mL/min — ABNORMAL LOW (ref >90)
GFR, EST AFRICAN AMERICAN: 75 mL/min — AB (ref >90)
GLUCOSE: 99 mg/dL (ref 70–99)
GLUCOSE: 124 mg/dL — AB (ref 70–99)
POTASSIUM: 4.1 meq/L (ref 3.5–5.3)
POTASSIUM: 3.6 meq/L — AB (ref 3.7–5.3)
Sodium: 142 mEq/L (ref 135–145)
Sodium: 141 mEq/L (ref 137–147)
Total Bilirubin: 0.4 mg/dL (ref 0.2–1.2)
Total Bilirubin: 0.5 mg/dL (ref 0.3–1.2)
Total Protein: 6.6 g/dL (ref 6.0–8.3)
Total Protein: 6.9 g/dL (ref 6.0–8.3)

## 2013-03-28 LAB — CBC WITH DIFFERENTIAL/PLATELET
BASOS PCT: 1 % (ref 0–1)
Basophils Absolute: 0 10*3/uL (ref 0.0–0.1)
EOS PCT: 3 % (ref 0–5)
Eosinophils Absolute: 0.2 10*3/uL (ref 0.0–0.7)
HEMATOCRIT: 38 % (ref 36.0–46.0)
HEMOGLOBIN: 12.9 g/dL (ref 12.0–15.0)
Lymphocytes Relative: 38 % (ref 12–46)
Lymphs Abs: 2.2 10*3/uL (ref 0.7–4.0)
MCH: 29.3 pg (ref 26.0–34.0)
MCHC: 33.9 g/dL (ref 30.0–36.0)
MCV: 86.2 fL (ref 78.0–100.0)
MONO ABS: 0.5 10*3/uL (ref 0.1–1.0)
MONOS PCT: 8 % (ref 3–12)
Neutro Abs: 3.1 10*3/uL (ref 1.7–7.7)
Neutrophils Relative %: 51 % (ref 43–77)
Platelets: 210 10*3/uL (ref 150–400)
RBC: 4.41 MIL/uL (ref 3.87–5.11)
RDW: 12.6 % (ref 11.5–15.5)
WBC: 5.9 10*3/uL (ref 4.0–10.5)

## 2013-03-28 LAB — TSH: TSH: 0.039 u[IU]/mL — ABNORMAL LOW (ref 0.350–4.500)

## 2013-03-28 LAB — TROPONIN I: Troponin I: <0.3 ng/mL (ref <0.30)

## 2013-03-28 LAB — LIPASE, BLOOD: Lipase: 45 U/L (ref 11–59)

## 2013-03-28 LAB — AMYLASE: Amylase: 40 U/L (ref 0–105)

## 2013-03-28 LAB — D-DIMER, QUANTITATIVE (NOT AT ARMC): D DIMER QUANT: 0.52 ug{FEU}/mL — AB (ref 0.00–0.48)

## 2013-03-28 MED ORDER — IOHEXOL 300 MG/ML  SOLN
25.0000 mL | Freq: Once | INTRAMUSCULAR | Status: AC | PRN
  Administered 2013-03-28: 25 mL via ORAL

## 2013-03-28 MED ORDER — DIPHENHYDRAMINE HCL 12.5 MG/5ML PO ELIX: 12.5000 mg | ORAL_SOLUTION | Freq: Four times a day (QID) | ORAL | Status: DC | PRN

## 2013-03-28 MED ORDER — PANTOPRAZOLE SODIUM 40 MG IV SOLR
40.0000 mg | Freq: Every day | INTRAVENOUS | Status: AC
  Administered 2013-03-28 – 2013-03-29 (×2): 40 mg via INTRAVENOUS
  Filled 2013-03-28 (×2): qty 40

## 2013-03-28 MED ORDER — DIPHENHYDRAMINE HCL 50 MG/ML IJ SOLN
25.0000 mg | Freq: Once | INTRAMUSCULAR | Status: AC
  Administered 2013-03-28: 25 mg via INTRAVENOUS
  Filled 2013-03-28: qty 1

## 2013-03-28 MED ORDER — DEXTROSE IN LACTATED RINGERS 5 % IV SOLN
INTRAVENOUS | Status: DC
  Administered 2013-03-28 – 2013-03-29 (×3): via INTRAVENOUS

## 2013-03-28 MED ORDER — HYDROMORPHONE HCL PF 1 MG/ML IJ SOLN
1.0000 mg | Freq: Once | INTRAMUSCULAR | Status: AC
  Administered 2013-03-28: 1 mg via INTRAVENOUS
  Filled 2013-03-28: qty 1

## 2013-03-28 MED ORDER — HYDROMORPHONE HCL PF 1 MG/ML IJ SOLN
0.5000 mg | INTRAMUSCULAR | Status: DC | PRN
  Administered 2013-03-28 – 2013-03-30 (×11): 1 mg via INTRAVENOUS
  Filled 2013-03-28 (×12): qty 1

## 2013-03-28 MED ORDER — HYDROMORPHONE HCL PF 1 MG/ML IJ SOLN
0.5000 mg | Freq: Once | INTRAMUSCULAR | Status: AC
  Administered 2013-03-28: 0.5 mg via INTRAVENOUS
  Filled 2013-03-28: qty 1

## 2013-03-28 MED ORDER — IOHEXOL 350 MG/ML SOLN
80.0000 mL | Freq: Once | INTRAVENOUS | Status: AC | PRN
  Administered 2013-03-28: 80 mL via INTRAVENOUS

## 2013-03-28 MED ORDER — ONDANSETRON HCL 4 MG/2ML IJ SOLN: 4.0000 mg | Freq: Four times a day (QID) | INTRAMUSCULAR | Status: DC | PRN

## 2013-03-28 MED ORDER — TECHNETIUM TC 99M MEBROFENIN IV KIT
5.0000 | PACK | Freq: Once | INTRAVENOUS | Status: AC | PRN
  Administered 2013-03-28: 5 via INTRAVENOUS

## 2013-03-28 MED ORDER — DIPHENHYDRAMINE HCL 50 MG/ML IJ SOLN
12.5000 mg | Freq: Four times a day (QID) | INTRAMUSCULAR | Status: DC | PRN
  Administered 2013-03-28 – 2013-03-30 (×5): 12.5 mg via INTRAVENOUS
  Filled 2013-03-28 (×5): qty 1

## 2013-03-28 NOTE — ED Notes (Signed)
Nurse first called to send husband to room when he arrives.

## 2013-03-28 NOTE — ED Notes (Signed)
Patient returned from xray.

## 2013-03-28 NOTE — ED Notes (Signed)
Patient returned from CT

## 2013-03-28 NOTE — ED Notes (Signed)
Dr. Mechele Claude at bedside

## 2013-03-28 NOTE — ED Notes (Signed)
Patient is still itching,

## 2013-03-28 NOTE — ED Notes (Signed)
Patient c/o itching, no rash or sob noted, vss

## 2013-03-28 NOTE — H&P (Signed)
[name] 1940/02/06  [id number].    Requesting MD: Dr. Ashok Cordia Chief Complaint/Reason for Consult: Abdominal Pain  HPI:  [name] is a 73 y.o. patient with history of HTN, Iatrogenic Hypothyroidism, GERD, and Diverticulitis. She has a 4 day history of RUQ that radiates to her back and she presented to the [facility] after advised to do so by her PCP. Pt states the pain started on Friday evening, 03/24/13 after eating a homemade pizza with hamburger and pepperoni, with minimal spices. The pain has been constant since it started. Pt states she has had several episodes of similar pain in the past, but these episodes were less severe, and went away after several hours. Pt c/o nausea with no vomiting.  She denies fever, chills, recent illness, recent trauma, urinary symptoms, or diarrhea.  Pt does state she has been having reflux lately and was given a PPI by her PCP yesterday.  She has had pain from reflux and Kidney stones before, and this pain doesn't feel anything like either.  She endorses burping and a sour taste in her mouth.  Pt denies smoking, caffeine, or use of NSAIDs. She uses alcohol only on occasion.   Pts sister had a similar presentation many years ago, and the only positive finding were on a HIDA scan. Pt also states her sleep has been interrupted by the pain.  Her last endoscopy and colonoscopy were both several years ago.  ROS: All systems reviewed and otherwise negative except for as above  Family History  Problem Relation Age of Onset  . Diverticulitis Mother   . Colon cancer Maternal Grandmother   . Cancer Daughter     Past Medical History  Diagnosis Date  . Hypertension   . Hyperthyroidism     nodule on thyroid, Radioactive, no hypo  . Arthritis     knees  . Depression   . Diverticulosis     bleeding  . Hypothyroidism, iatrogenic     After RI now hypo on synthroid    Past Surgical History  Procedure Laterality Date  . Bladder surgery      bladder suspension   . Abdominal hysterectomy  1977  . Total knee revision  12/17/2010    Procedure: TOTAL KNEE REVISION;  Surgeon: Dione Plover Aluisio;  Location: [facility];  Service: Orthopedics;  Laterality: Right;  . Joint replacement Right 2009    total knee replacement  . Rotator cuff repair Left     Social History:  reports that she has never smoked. She has never used smokeless tobacco. She reports that she does not drink alcohol or use illicit drugs.  Allergies:  Allergies  Allergen Reactions  . Amitriptyline Other (See Comments)    Intolerance, hallucinations  . Demerol Other (See Comments)    migraines  . Fish Allergy Nausea Only  . Nsaids     Lower GI bleeding  . Morphine And Related Rash     (Not in a hospital admission)  Blood pressure 167/90, pulse 98, temperature 98.3 F (36.8 C), temperature source Oral, resp. rate 21, weight 165 lb (74.844 kg), SpO2 100.00%. Physical Exam: General: pleasant, WD/WN white female who is in NAD, but has tears in her eyes. HEENT: head is normocephalic, atraumatic.  Sclera are noninjected.  PERRL.  Ears and nose without any masses or lesions.  Mouth is pink and moist, Uvula midline. Heart: regular, rate, and rhythm.  No obvious murmurs, gallops, or rubs noted.  Palpable pedal pulses bilaterally Lungs: CTAB, no wheezes,  rhonchi, or rales noted.  Respiratory effort nonlabored Abd: soft, mild distension.  Pt tender to RUQ. No other pain.  Well healed circular scar around umbilicus from a GYN/Urology surgery for bladders suspension.  Back: Non tender, Negative CVA tenderness.  No mass, rash, or lesion. MS: all 4 extremities are symmetrical with no cyanosis, clubbing, or edema. Psych: A&Ox3 with an appropriate affect.   Results for orders placed during the hospital encounter of 03/28/13 (from the past 48 hour(s))  LIPASE, BLOOD     Status: None   Collection Time    03/28/13 10:15 AM      Result Value Ref Range   Lipase 45  11 - 59 U/L  D-DIMER, QUANTITATIVE      Status: Abnormal   Collection Time    03/28/13 10:15 AM      Result Value Ref Range   D-Dimer, Quant 0.52 (*) 0.00 - 0.48 ug/mL-FEU   Comment:            AT THE INHOUSE ESTABLISHED CUTOFF     VALUE OF 0.48 ug/mL FEU,     THIS ASSAY HAS BEEN DOCUMENTED     IN THE LITERATURE TO HAVE     A SENSITIVITY AND NEGATIVE     PREDICTIVE VALUE OF AT LEAST     98 TO 99%.  THE TEST RESULT     SHOULD BE CORRELATED WITH     AN ASSESSMENT OF THE CLINICAL     PROBABILITY OF DVT / VTE.  COMPREHENSIVE METABOLIC PANEL     Status: Abnormal   Collection Time    03/28/13 10:15 AM      Result Value Ref Range   Sodium 141  137 - 147 mEq/L   Potassium 3.6 (*) 3.7 - 5.3 mEq/L   Chloride 100  96 - 112 mEq/L   CO2 22  19 - 32 mEq/L   Glucose, Bld 124 (*) 70 - 99 mg/dL   BUN 15  6 - 23 mg/dL   Creatinine, Ser 0.88  0.50 - 1.10 mg/dL   Calcium 9.7  8.4 - 10.5 mg/dL   Total Protein 6.9  6.0 - 8.3 g/dL   Albumin 3.6  3.5 - 5.2 g/dL   AST 15  0 - 37 U/L   ALT 15  0 - 35 U/L   Alkaline Phosphatase 79  39 - 117 U/L   Total Bilirubin 0.5  0.3 - 1.2 mg/dL   GFR calc non Af Amer 65 (*) >90 mL/min   GFR calc Af Amer 75 (*) >90 mL/min   Comment: (NOTE)     The eGFR has been calculated using the CKD EPI equation.     This calculation has not been validated in all clinical situations.     eGFR's persistently <90 mL/min signify possible Chronic Kidney     Disease.  CBC WITH DIFFERENTIAL     Status: None   Collection Time    03/28/13 10:15 AM      Result Value Ref Range   WBC 5.9  4.0 - 10.5 K/uL   RBC 4.41  3.87 - 5.11 MIL/uL   Hemoglobin 12.9  12.0 - 15.0 g/dL   HCT 38.0  36.0 - 46.0 %   MCV 86.2  78.0 - 100.0 fL   MCH 29.3  26.0 - 34.0 pg   MCHC 33.9  30.0 - 36.0 g/dL   RDW 12.6  11.5 - 15.5 %   Platelets 210  150 - 400 K/uL  Neutrophils Relative % 51  43 - 77 %   Neutro Abs 3.1  1.7 - 7.7 K/uL   Lymphocytes Relative 38  12 - 46 %   Lymphs Abs 2.2  0.7 - 4.0 K/uL   Monocytes Relative 8  3 - 12 %    Monocytes Absolute 0.5  0.1 - 1.0 K/uL   Eosinophils Relative 3  0 - 5 %   Eosinophils Absolute 0.2  0.0 - 0.7 K/uL   Basophils Relative 1  0 - 1 %   Basophils Absolute 0.0  0.0 - 0.1 K/uL  TROPONIN I     Status: None   Collection Time    03/28/13 10:15 AM      Result Value Ref Range   Troponin I <0.30  <0.30 ng/mL   Comment:            Due to the release kinetics of cTnI,     a negative result within the first hours     of the onset of symptoms does not rule out     myocardial infarction with certainty.     If myocardial infarction is still suspected,     repeat the test at appropriate intervals.   Dg Chest 2 View  03/28/2013   CLINICAL DATA:  Chest pain  EXAM: CHEST  2 VIEW  COMPARISON:  12/08/2010  FINDINGS: The heart size and mediastinal contours are within normal limits. Both lungs are clear. The visualized skeletal structures are unremarkable.  IMPRESSION: No active cardiopulmonary disease.   Electronically Signed   By: Franchot Gallo M.D.   On: 03/28/2013 10:36   Ct Angio Chest W/cm &/or Wo Cm  03/28/2013   CLINICAL DATA:  Comments right abdominal pain, right lower thoracic pain. Pain radiating to back since Friday. Increasing in severity.  EXAM: CT ANGIOGRAPHY CHEST  CT ABDOMEN AND PELVIS WITH CONTRAST  TECHNIQUE: Multidetector CT imaging of the chest was performed using the standard protocol during bolus administration of intravenous contrast. Multiplanar CT image reconstructions and MIPs were obtained to evaluate the vascular anatomy. Multidetector CT imaging of the abdomen and pelvis was performed using the standard protocol during bolus administration of intravenous contrast.  CONTRAST:  74m OMNIPAQUE IOHEXOL 350 MG/ML SOLN  COMPARISON:  [place] COM[place] dated 03/27/2013; CT ABD/PELVIS W CM dated 02/04/2010  FINDINGS: CTA CHEST FINDINGS  There are no filling defects within the pulmonary arteries to suggest acute pulmonary embolism. No acute findings aorta great vessels. Bones  aortic dissection or transsection.  No pericardial fluid.  No mediastinal lymphadenopathy.  Review of the lung parenchyma demonstrates no pneumothorax or pleural fluid. No pulmonary infarction. No chest wall abnormality.  CT ABDOMEN and PELVIS FINDINGS  No focal hepatic lesion. The gallbladder, pancreas, spleen, and adrenal glands normal. There is bilateral nephrolithiasis without obstructive uropathy. No ureteral lithiasis  There is a calcification within the course of the distal measuring 3 mm (image 73, series 5). This calculus eccentric to the ureter but is new from comparison exam . Calculus is approximately 3 cm from the vesicoureteral junction. No bladder calculi.  Stomach, small bowel, cecum normal. The colon and rectosigmoid colon are normal.  Abdominal or is normal caliber. No retroperitoneal periportal lymphadenopathy. No free fluid the pelvis. No pelvic lymphadenopathy. Post hysterectomy. No aggressive osseous lesion.  Review of the MIP images confirms the above findings.  IMPRESSION: 1. No evidence of pulmonary embolism or aortic dissection. 2. Potential nonobstructing calculus within the distal right ureter. This calculus is  slightly eccentric to the ureter but is new from prior therefore concerning for a ureteral calculus. No obstructive uropathy. 3. Bilateral nephrolithiasis.   Electronically Signed   By: Suzy Bouchard M.D.   On: 03/28/2013 13:09   US Abdomen Complete  03/27/2013   CLINICAL DATA:  Right upper quadrant abdominal pain radiating into the back. History of renal calculi, diverticulosis and hysterectomy.  EXAM: ULTRASOUND ABDOMEN COMPLETE  COMPARISON:  DG ABDOMEN 1V dated 09/14/2012; CT ABD/PELVIS W CM dated 02/04/2010  FINDINGS: Gallbladder:  No gallstones or wall thickening visualized. No sonographic Murphy sign noted.  Common bile duct:  Diameter: 4 mm.  No evidence of choledocholithiasis.  Liver:  The hepatic echogenicity is increased without focal abnormality. The hepatic contours  are normal.  IVC:  Partially obscured by bowel gas.  No demonstrated abnormality.  Pancreas:  Partially obscured by bowel gas.  No demonstrated abnormality.  Spleen:  Size and appearance within normal limits.  Right Kidney:  Length: 9.3 cm. Echogenicity within normal limits. No mass or hydronephrosis visualized.  Left Kidney:  Length: 10.3 cm. There is a cyst in the upper pole measuring 1.8 cm. No other focal cortical abnormality or hydronephrosis is demonstrated. There are several echogenic shadowing foci consistent with calculi as correlated with prior CT.  Abdominal aorta:  No aneurysm visualized.  Other findings:  None.  IMPRESSION: 1. No acute abdominal findings identified. 2. Several echogenic foci in the left kidney correspond with calculi on prior CT. No hydronephrosis. 3. Probable hepatic steatosis. 4. Portions of the IVC, aorta and pancreas are obscured by bowel gas.   Electronically Signed   By: Camie Patience M.D.   On: 03/27/2013 15:58   Ct Abdomen Pelvis W Contrast  03/28/2013   CLINICAL DATA:  Comments right abdominal pain, right lower thoracic pain. Pain radiating to back since Friday. Increasing in severity.  EXAM: CT ANGIOGRAPHY CHEST  CT ABDOMEN AND PELVIS WITH CONTRAST  TECHNIQUE: Multidetector CT imaging of the chest was performed using the standard protocol during bolus administration of intravenous contrast. Multiplanar CT image reconstructions and MIPs were obtained to evaluate the vascular anatomy. Multidetector CT imaging of the abdomen and pelvis was performed using the standard protocol during bolus administration of intravenous contrast.  CONTRAST:  27m OMNIPAQUE IOHEXOL 350 MG/ML SOLN  COMPARISON:  [place] COMPLETE dated 03/27/2013; CT ABD/PELVIS W CM dated 02/04/2010  FINDINGS: CTA CHEST FINDINGS  There are no filling defects within the pulmonary arteries to suggest acute pulmonary embolism. No acute findings aorta great vessels. Bones aortic dissection or transsection.  No  pericardial fluid.  No mediastinal lymphadenopathy.  Review of the lung parenchyma demonstrates no pneumothorax or pleural fluid. No pulmonary infarction. No chest wall abnormality.  CT ABDOMEN and PELVIS FINDINGS  No focal hepatic lesion. The gallbladder, pancreas, spleen, and adrenal glands normal. There is bilateral nephrolithiasis without obstructive uropathy. No ureteral lithiasis  There is a calcification within the course of the distal measuring 3 mm (image 73, series 5). This calculus eccentric to the ureter but is new from comparison exam . Calculus is approximately 3 cm from the vesicoureteral junction. No bladder calculi.  Stomach, small bowel, cecum normal. The colon and rectosigmoid colon are normal.  Abdominal or is normal caliber. No retroperitoneal periportal lymphadenopathy. No free fluid the pelvis. No pelvic lymphadenopathy. Post hysterectomy. No aggressive osseous lesion.  Review of the MIP images confirms the above findings.  IMPRESSION: 1. No evidence of pulmonary embolism or aortic dissection. 2. Potential nonobstructing  calculus within the distal right ureter. This calculus is slightly eccentric to the ureter but is new from prior therefore concerning for a ureteral calculus. No obstructive uropathy. 3. Bilateral nephrolithiasis.   Electronically Signed   By: Suzy Bouchard M.D.   On: 03/28/2013 13:09      Assessment/Plan Abdominal Pain; RUQ Hypertension Iatrogenic Hypothyroidism Hx of Diverticulosis/itis GERD  1. Ct scan, [place], and all labs negative for acute GB findings 2. Perform HIDA scan for suspected cholecystitis vs biliary dyskinesia 3. Continue NPO, IVF, antiemetics, will hold off on ABX for now given no white count. 4. Hold pain control until after HIDA scan. 5. Ambulate and IS. 6. SCDs for VTE proph 7. Repeat CBC, Lipase, and CMET in the morning 8. Consider for Surgery pending HIDA results. 9. Pt is insistent that her GB is the problem, and prefers consideration  for lap chole to all other treatment options.  Barrington Ellison, PA-S [facility] 03/28/2013, 4:02 PM Pager: [phone]

## 2013-03-28 NOTE — ED Notes (Addendum)
Constant right abdominal/right lower thoracic pain that is radiates to back since Friday night that is increasing in severity. Denies nausea, vomiting, diarrhea, constipation.  Denies blood in stool and pain with urination.

## 2013-03-28 NOTE — Telephone Encounter (Signed)
Spoke with pt and informed of labs and ultrasound results  She still is having severe pain this am going through to her back.  Advised to go to ER for further evaluation EKG, CXR and possible CT.  She states she is only SOB because of the pain.  No radiation down left arm or jaw no diaphoresis,  No N/V  She voices understanding and will go to ER

## 2013-03-28 NOTE — ED Notes (Signed)
CT notified that patient is done with contrast

## 2013-03-28 NOTE — ED Notes (Signed)
Report already called to floor by Jeral Fruit, RN. Evelena Peat nurse tech transporting patient to 6 [address] at this time with family following.

## 2013-03-28 NOTE — ED Notes (Signed)
Informed Dr. Ashok Cordia of patient's reported itching.

## 2013-03-28 NOTE — ED Notes (Signed)
NM called about scan. States it will be about an hour. Pt and family informed.

## 2013-03-28 NOTE — ED Provider Notes (Signed)
CSN: [id number]     Arrival date & time 03/28/13  0910 History   First MD Initiated Contact with Patient 03/28/13 [phone]     Chief Complaint  Patient presents with  . Abdominal Pain     (Consider location/radiation/quality/duration/timing/severity/associated sxs/prior Treatment) Patient is a 73 y.o. female presenting with abdominal pain. The history is provided by the patient. A language interpreter was used.  Abdominal Pain Associated symptoms: no chest pain, no constipation, no cough, no diarrhea, no dysuria, no fever, no nausea, no shortness of breath and no vomiting   This is a 73yo WF w/ PMH HTN, Arthritis, hyperthyroidism, depression, diverticulosis who presents w/ c/o RUQ pain x 4 days. Patient reports having sharp, constant RUQ pain that radiates to her R back since Friday last week. The pain is not pleuritic or made worse with movement. No prior injury to this area or unusual activity recently. No similar prior episodes. Denies N/V/D, constipation, bloody stools, dysuria, cough. She has had normal appetite during this time and has been eating/drinking well at home. She was evaluated by PCP yesterday at which time she had a normal CMP, CBC, and a RUQ [place] that showed hepatic steatosis and renal calculi on L side, but no acute abnormality that would explain RUQ abd pain.   Past Medical History  Diagnosis Date  . Hypertension   . Hyperthyroidism     nodule on thyroid, no meds.  . Arthritis     knees  . Depression   . Diverticulitis    Past Surgical History  Procedure Laterality Date  . Bladder surgery      bladder suspension  . Abdominal hysterectomy  1977  . Total knee revision  12/17/2010    Procedure: TOTAL KNEE REVISION;  Surgeon: Dione Plover Aluisio;  Location: [facility];  Service: Orthopedics;  Laterality: Right;  . Joint replacement  2009    total knee replacement  . Colon surgery  3 years ago    colonoscopy  . Rotator cuff repair     Family History  Problem Relation Age of  Onset  . Diverticulitis Mother   . Colon cancer Maternal Grandmother   . Cancer Daughter    History  Substance Use Topics  . Smoking status: Never Smoker   . Smokeless tobacco: Never Used  . Alcohol Use: No   OB History   Grav Para Term Preterm Abortions TAB SAB Ect Mult Living                 Review of Systems  Constitutional: Negative for fever.  Respiratory: Negative for cough and shortness of breath.   Cardiovascular: Negative for chest pain and leg swelling.  Gastrointestinal: Positive for abdominal pain. Negative for nausea, vomiting, diarrhea, constipation and blood in stool.  Genitourinary: Negative for dysuria.  Musculoskeletal: Positive for back pain.  Skin: Negative for rash.  Neurological: Negative for syncope and light-headedness.  All other systems reviewed and are negative.      Allergies  Amitriptyline; Demerol; Fish allergy; and Morphine and related  Home Medications   Current Outpatient Rx  Name  Route  Sig  Dispense  Refill  . benazepril-hydrochlorthiazide (LOTENSIN HCT) 20-12.5 MG per tablet   Oral   Take 1 tablet by mouth daily.         Marland Kitchen levothyroxine (SYNTHROID, LEVOTHROID) 112 MCG tablet   Oral   Take 1 tablet (112 mcg total) by mouth daily.   30 tablet   3   . mometasone (NASONEX) 50  MCG/ACT nasal spray   Nasal   Place 1 spray into the nose daily as needed (allergies).         Marland Kitchen OVER THE COUNTER MEDICATION   Oral   Take 1 tablet by mouth daily. DIGESTIVE ADVANTAGE          . pantoprazole (PROTONIX) 40 MG tablet   Oral   Take 1 tablet (40 mg total) by mouth daily.   30 tablet   3   . promethazine (PHENERGAN) 25 MG tablet   Oral   Take 1 tablet (25 mg total) by mouth every 8 (eight) hours as needed for nausea or vomiting.   20 tablet   0   . Red Yeast Rice Extract (RED YEAST RICE PO)   Oral   Take 1 tablet by mouth daily.         . traMADol (ULTRAM) 50 MG tablet   Oral   Take 1 tablet (50 mg total) by mouth every  8 (eight) hours as needed.   30 tablet   0   . valACYclovir (VALTREX) 1000 MG tablet   Oral   Take 1,000 mg by mouth once as needed (fever blisters).           BP 153/74  Pulse 105  Temp(Src) 98.3 F (36.8 C) (Oral)  Resp 26  Wt 165 lb (74.844 kg)  SpO2 100% Physical Exam  Constitutional: She is oriented to person, place, and time. She appears well-developed and well-nourished. No distress.  HENT:  Head: Normocephalic and atraumatic.  Cardiovascular:  Tachycardic to low 100s, regular rhythm, no M/G/R  Pulmonary/Chest: Effort normal and breath sounds normal. No respiratory distress. She exhibits tenderness.  Abdominal: Soft. Bowel sounds are normal. She exhibits no distension. There is tenderness. There is no rebound and no guarding.  Musculoskeletal:  R upper back just below scapula is TTP  Neurological: She is alert and oriented to person, place, and time.  Skin: Skin is warm and dry.    ED Course  Procedures (including critical care time) Labs Review Labs Reviewed  LIPASE, BLOOD  D-DIMER, QUANTITATIVE  COMPREHENSIVE METABOLIC PANEL  CBC WITH DIFFERENTIAL  TROPONIN I   Imaging Review US Abdomen Complete  03/27/2013   CLINICAL DATA:  Right upper quadrant abdominal pain radiating into the back. History of renal calculi, diverticulosis and hysterectomy.  EXAM: ULTRASOUND ABDOMEN COMPLETE  COMPARISON:  DG ABDOMEN 1V dated 09/14/2012; CT ABD/PELVIS W CM dated 02/04/2010  FINDINGS: Gallbladder:  No gallstones or wall thickening visualized. No sonographic Murphy sign noted.  Common bile duct:  Diameter: 4 mm.  No evidence of choledocholithiasis.  Liver:  The hepatic echogenicity is increased without focal abnormality. The hepatic contours are normal.  IVC:  Partially obscured by bowel gas.  No demonstrated abnormality.  Pancreas:  Partially obscured by bowel gas.  No demonstrated abnormality.  Spleen:  Size and appearance within normal limits.  Right Kidney:  Length: 9.3 cm.  Echogenicity within normal limits. No mass or hydronephrosis visualized.  Left Kidney:  Length: 10.3 cm. There is a cyst in the upper pole measuring 1.8 cm. No other focal cortical abnormality or hydronephrosis is demonstrated. There are several echogenic shadowing foci consistent with calculi as correlated with prior CT.  Abdominal aorta:  No aneurysm visualized.  Other findings:  None.  IMPRESSION: 1. No acute abdominal findings identified. 2. Several echogenic foci in the left kidney correspond with calculi on prior CT. No hydronephrosis. 3. Probable hepatic steatosis. 4. Portions of the  IVC, aorta and pancreas are obscured by bowel gas.   Electronically Signed   By: Camie Patience M.D.   On: 03/27/2013 15:58     EKG Interpretation   Date/Time:  Tuesday March 28 2013 09:47:57 EST Ventricular Rate:  84 PR Interval:  147 QRS Duration: 73 QT Interval:  377 QTC Calculation: 446 R Axis:   56 Text Interpretation:  Sinus rhythm `no acute st/t changes No significant  change since last tracing Confirmed by Ashok Cordia  MD, Lennette Bihari ([id number]) on  03/28/2013 9:51:46 AM      MDM   Patient with RUQ pain that radiates into her back, no other GI complaints. She is afebrile, though mildly tachycardic. Pt had normal RUQ [place] yesterday at PCP office as well as basic labs that were wnl. Therefore cholelithiasis or cholecystitis is less likely. Pt does have previously documented bilateral nonobstructive renal calculi bilaterally, so nephrolithiasis is possible, though pain is not associated with urination nor does she have colicky pain, which would be more typical for kidney stones. PE is possible given her age as well as tachycardia. ASC is less likely given EKG both yesterday and today are normal and her pain is atypical.   We will start by obtaining CMP, lipase, CBC, d dimer, EKG, CXR. I will give one dose of dilaudid.    11:26 AM Patient's CBC, CMP, lipase, EKG all unrevealing. Troponin x 1 neg. D dimer is only very  mildly elevated. Will pursue CTA chest and CT abdomen to further evaluate her symptoms. Patient c/o continued pain, so gave another dose of dilaudid and her pain is more well controlled now. Her blood pressure has improved and is 135/80, HR 88.  1:24 PM Patient's CTA chest and CT abd significant only for nonobstructing calculus in R distal ureter. No evidence of PE or aortic dissection.   3:21 PM Patient insistent that she needs to be evaluated by general surgery. Gen surg was called and they are familiar w/ patient and they saw patient. General surgery will admit patient for HIDA scan.  Rebecca Eaton, MD 03/28/13 1545  Rebecca Eaton, MD 03/28/13 [phone]

## 2013-03-28 NOTE — ED Notes (Signed)
Patient to CT.

## 2013-03-29 ENCOUNTER — Observation Stay (HOSPITAL_COMMUNITY): Payer: Medicare PPO | Admitting: Anesthesiology

## 2013-03-29 ENCOUNTER — Ambulatory Visit: Payer: Medicare PPO | Admitting: Internal Medicine

## 2013-03-29 ENCOUNTER — Encounter (HOSPITAL_COMMUNITY)
Admission: EM | Disposition: A | Discharge status: Home / Self Care | Payer: Self-pay | Source: Home / Self Care | Attending: Emergency Medicine

## 2013-03-29 ENCOUNTER — Encounter (HOSPITAL_COMMUNITY): Payer: Medicare PPO | Admitting: Anesthesiology

## 2013-03-29 DIAGNOSIS — K811 Chronic cholecystitis: Secondary | ICD-10-CM

## 2013-03-29 HISTORY — PX: CHOLECYSTECTOMY: SHX55

## 2013-03-29 LAB — COMPREHENSIVE METABOLIC PANEL
ALK PHOS: 72 U/L (ref 39–117)
ALT: 15 U/L (ref 0–35)
AST: 15 U/L (ref 0–37)
Albumin: 3.3 g/dL — ABNORMAL LOW (ref 3.5–5.2)
BILIRUBIN TOTAL: 0.4 mg/dL (ref 0.3–1.2)
BUN: 14 mg/dL (ref 6–23)
CO2: 29 mEq/L (ref 19–32)
Calcium: 9.6 mg/dL (ref 8.4–10.5)
Chloride: 100 mEq/L (ref 96–112)
Creatinine, Ser: 0.98 mg/dL (ref 0.50–1.10)
GFR, EST AFRICAN AMERICAN: 66 mL/min — AB (ref >90)
GFR, EST NON AFRICAN AMERICAN: 57 mL/min — AB (ref >90)
Glucose, Bld: 147 mg/dL — ABNORMAL HIGH (ref 70–99)
Potassium: 4 mEq/L (ref 3.7–5.3)
Sodium: 142 mEq/L (ref 137–147)
TOTAL PROTEIN: 6.4 g/dL (ref 6.0–8.3)

## 2013-03-29 LAB — LIPASE, BLOOD: LIPASE: 36 U/L (ref 11–59)

## 2013-03-29 LAB — SURGICAL PCR SCREEN
MRSA, PCR: NEGATIVE
Staphylococcus aureus: NEGATIVE

## 2013-03-29 LAB — CBC
HEMATOCRIT: 38.5 % (ref 36.0–46.0)
Hemoglobin: 12.9 g/dL (ref 12.0–15.0)
MCH: 29.5 pg (ref 26.0–34.0)
MCHC: 33.5 g/dL (ref 30.0–36.0)
MCV: 87.9 fL (ref 78.0–100.0)
PLATELETS: 198 10*3/uL (ref 150–400)
RBC: 4.38 MIL/uL (ref 3.87–5.11)
RDW: 12.9 % (ref 11.5–15.5)
WBC: 5.2 10*3/uL (ref 4.0–10.5)

## 2013-03-29 SURGERY — LAPAROSCOPIC CHOLECYSTECTOMY WITH INTRAOPERATIVE CHOLANGIOGRAM
Anesthesia: General | Site: Abdomen

## 2013-03-29 MED ORDER — FLUTICASONE PROPIONATE 50 MCG/ACT NA SUSP
2.0000 | Freq: Every day | NASAL | Status: DC
  Filled 2013-03-29: qty 16

## 2013-03-29 MED ORDER — ONDANSETRON HCL 4 MG/2ML IJ SOLN
INTRAMUSCULAR | Status: AC
  Filled 2013-03-29: qty 2

## 2013-03-29 MED ORDER — FENTANYL CITRATE 0.05 MG/ML IJ SOLN
INTRAMUSCULAR | Status: AC
  Filled 2013-03-29: qty 5

## 2013-03-29 MED ORDER — MIDAZOLAM HCL 2 MG/2ML IJ SOLN
INTRAMUSCULAR | Status: AC
  Filled 2013-03-29: qty 2

## 2013-03-29 MED ORDER — CHLORHEXIDINE GLUCONATE 4 % EX LIQD
1.0000 "application " | Freq: Once | CUTANEOUS | Status: DC
  Filled 2013-03-29: qty 15

## 2013-03-29 MED ORDER — NEOSTIGMINE METHYLSULFATE 1 MG/ML IJ SOLN
INTRAMUSCULAR | Status: AC
  Filled 2013-03-29: qty 10

## 2013-03-29 MED ORDER — 0.9 % SODIUM CHLORIDE (POUR BTL) OPTIME
TOPICAL | Status: DC | PRN
  Administered 2013-03-29: 1000 mL

## 2013-03-29 MED ORDER — HYDROMORPHONE HCL PF 1 MG/ML IJ SOLN
0.2500 mg | INTRAMUSCULAR | Status: DC | PRN
  Administered 2013-03-29: 0.5 mg via INTRAVENOUS
  Administered 2013-03-29: 1 mg via INTRAVENOUS

## 2013-03-29 MED ORDER — HYDROMORPHONE HCL PF 1 MG/ML IJ SOLN
INTRAMUSCULAR | Status: AC
  Filled 2013-03-29: qty 1

## 2013-03-29 MED ORDER — ROCURONIUM BROMIDE 100 MG/10ML IV SOLN
INTRAVENOUS | Status: DC | PRN
  Administered 2013-03-29: 50 mg via INTRAVENOUS

## 2013-03-29 MED ORDER — LACTATED RINGERS IV SOLN
INTRAVENOUS | Status: DC | PRN
  Administered 2013-03-29 (×2): via INTRAVENOUS

## 2013-03-29 MED ORDER — LEVOTHYROXINE SODIUM 112 MCG PO TABS
112.0000 ug | ORAL_TABLET | Freq: Every day | ORAL | Status: DC
  Filled 2013-03-29 (×2): qty 1

## 2013-03-29 MED ORDER — LIDOCAINE HCL (CARDIAC) 20 MG/ML IV SOLN
INTRAVENOUS | Status: DC | PRN
  Administered 2013-03-29: 100 mg via INTRAVENOUS

## 2013-03-29 MED ORDER — GLYCOPYRROLATE 0.2 MG/ML IJ SOLN
INTRAMUSCULAR | Status: DC | PRN
  Administered 2013-03-29: .4 mg via INTRAVENOUS

## 2013-03-29 MED ORDER — NEOSTIGMINE METHYLSULFATE 1 MG/ML IJ SOLN
INTRAMUSCULAR | Status: DC | PRN
  Administered 2013-03-29: 3 mg via INTRAVENOUS

## 2013-03-29 MED ORDER — SODIUM CHLORIDE 0.9 % IV SOLN
INTRAVENOUS | Status: DC | PRN
  Administered 2013-03-29: 11:00:00

## 2013-03-29 MED ORDER — MEPERIDINE HCL 25 MG/ML IJ SOLN: 6.2500 mg | INTRAMUSCULAR | Status: DC | PRN

## 2013-03-29 MED ORDER — LABETALOL HCL 5 MG/ML IV SOLN
INTRAVENOUS | Status: DC | PRN
  Administered 2013-03-29 (×2): 2.5 mg via INTRAVENOUS

## 2013-03-29 MED ORDER — ONDANSETRON HCL 4 MG/2ML IJ SOLN: 4.0000 mg | Freq: Once | INTRAMUSCULAR | Status: DC | PRN

## 2013-03-29 MED ORDER — BUPIVACAINE-EPINEPHRINE (PF) 0.5% -1:200000 IJ SOLN
INTRAMUSCULAR | Status: AC
  Filled 2013-03-29: qty 10

## 2013-03-29 MED ORDER — CEFAZOLIN SODIUM-DEXTROSE 2-3 GM-% IV SOLR
2.0000 g | INTRAVENOUS | Status: AC
  Administered 2013-03-29: 2 g via INTRAVENOUS
  Filled 2013-03-29: qty 50

## 2013-03-29 MED ORDER — VALACYCLOVIR HCL 500 MG PO TABS
1000.0000 mg | ORAL_TABLET | Freq: Once | ORAL | Status: DC | PRN
  Filled 2013-03-29: qty 2

## 2013-03-29 MED ORDER — OXYCODONE HCL 5 MG/5ML PO SOLN: 5.0000 mg | Freq: Once | ORAL | Status: AC | PRN

## 2013-03-29 MED ORDER — BENAZEPRIL-HYDROCHLOROTHIAZIDE 20-12.5 MG PO TABS: 1.0000 | ORAL_TABLET | Freq: Every day | ORAL | Status: DC

## 2013-03-29 MED ORDER — OXYCODONE HCL 5 MG PO TABS
ORAL_TABLET | ORAL | Status: AC
  Filled 2013-03-29: qty 1

## 2013-03-29 MED ORDER — FENTANYL CITRATE 0.05 MG/ML IJ SOLN
INTRAMUSCULAR | Status: DC | PRN
  Administered 2013-03-29 (×2): 50 ug via INTRAVENOUS
  Administered 2013-03-29: 150 ug via INTRAVENOUS

## 2013-03-29 MED ORDER — LABETALOL HCL 5 MG/ML IV SOLN
INTRAVENOUS | Status: AC
  Filled 2013-03-29: qty 4

## 2013-03-29 MED ORDER — BENAZEPRIL HCL 20 MG PO TABS
20.0000 mg | ORAL_TABLET | Freq: Every day | ORAL | Status: DC
  Administered 2013-03-30: 20 mg via ORAL
  Filled 2013-03-29: qty 1

## 2013-03-29 MED ORDER — SODIUM CHLORIDE 0.9 % IR SOLN
Status: DC | PRN
  Administered 2013-03-29: 1000 mL

## 2013-03-29 MED ORDER — PANTOPRAZOLE SODIUM 40 MG PO TBEC
40.0000 mg | DELAYED_RELEASE_TABLET | Freq: Every day | ORAL | Status: DC
  Administered 2013-03-30: 40 mg via ORAL
  Filled 2013-03-29: qty 1

## 2013-03-29 MED ORDER — BUPIVACAINE-EPINEPHRINE 0.5% -1:200000 IJ SOLN
INTRAMUSCULAR | Status: DC | PRN
  Administered 2013-03-29: 30 mL

## 2013-03-29 MED ORDER — HYDROCHLOROTHIAZIDE 12.5 MG PO CAPS
12.5000 mg | ORAL_CAPSULE | Freq: Every day | ORAL | Status: DC
  Administered 2013-03-30: 12.5 mg via ORAL
  Filled 2013-03-29: qty 1

## 2013-03-29 MED ORDER — OXYCODONE HCL 5 MG PO TABS
5.0000 mg | ORAL_TABLET | Freq: Once | ORAL | Status: AC | PRN
  Administered 2013-03-29: 5 mg via ORAL

## 2013-03-29 MED ORDER — LACTATED RINGERS IV SOLN
INTRAVENOUS | Status: DC
  Administered 2013-03-29: 10:00:00 via INTRAVENOUS

## 2013-03-29 MED ORDER — ONDANSETRON HCL 4 MG/2ML IJ SOLN
INTRAMUSCULAR | Status: DC | PRN
  Administered 2013-03-29: 4 mg via INTRAVENOUS

## 2013-03-29 MED ORDER — PROPOFOL 10 MG/ML IV BOLUS
INTRAVENOUS | Status: DC | PRN
  Administered 2013-03-29: 100 mg via INTRAVENOUS

## 2013-03-29 MED ORDER — GLYCOPYRROLATE 0.2 MG/ML IJ SOLN
INTRAMUSCULAR | Status: AC
  Filled 2013-03-29: qty 2

## 2013-03-29 MED ORDER — MIDAZOLAM HCL 2 MG/2ML IJ SOLN
INTRAMUSCULAR | Status: DC | PRN
  Administered 2013-03-29: 1 mg via INTRAVENOUS

## 2013-03-29 SURGICAL SUPPLY — 47 items
APPLIER CLIP ROT 10 11.4 M/L (STAPLE) ×2
BLADE SURG 15 STRL LF DISP TIS (BLADE) ×1 IMPLANT
BLADE SURG 15 STRL SS (BLADE) ×1
BLADE SURG ROTATE 9660 (MISCELLANEOUS) IMPLANT
CANISTER SUCTION 2500CC (MISCELLANEOUS) ×2 IMPLANT
CHLORAPREP W/TINT 26ML (MISCELLANEOUS) ×2 IMPLANT
CLIP APPLIE ROT 10 11.4 M/L (STAPLE) ×1 IMPLANT
COVER MAYO STAND STRL (DRAPES) ×2 IMPLANT
COVER SURGICAL LIGHT HANDLE (MISCELLANEOUS) ×2 IMPLANT
DECANTER SPIKE VIAL GLASS SM (MISCELLANEOUS) IMPLANT
DERMABOND ADVANCED (GAUZE/BANDAGES/DRESSINGS) ×1
DERMABOND ADVANCED .7 DNX12 (GAUZE/BANDAGES/DRESSINGS) ×1 IMPLANT
DRAPE C-ARM 42X72 X-RAY (DRAPES) ×2 IMPLANT
DRAPE UTILITY 15X26 W/TAPE STR (DRAPE) ×4 IMPLANT
ELECT REM PT RETURN 9FT ADLT (ELECTROSURGICAL) ×2
ELECTRODE REM PT RTRN 9FT ADLT (ELECTROSURGICAL) ×1 IMPLANT
GLOVE BIO SURGEON STRL SZ7 (GLOVE) ×2 IMPLANT
GLOVE BIOGEL PI IND STRL 6.5 (GLOVE) ×1 IMPLANT
GLOVE BIOGEL PI IND STRL 7.0 (GLOVE) ×2 IMPLANT
GLOVE BIOGEL PI IND STRL 8 (GLOVE) ×1 IMPLANT
GLOVE BIOGEL PI INDICATOR 6.5 (GLOVE) ×1
GLOVE BIOGEL PI INDICATOR 7.0 (GLOVE) ×2
GLOVE BIOGEL PI INDICATOR 8 (GLOVE) ×1
GLOVE SS BIOGEL STRL SZ 7.5 (GLOVE) ×1 IMPLANT
GLOVE SS N UNI LF 6.5 STRL (GLOVE) ×2 IMPLANT
GLOVE SUPERSENSE BIOGEL SZ 7.5 (GLOVE) ×1
GOWN STRL NON-REIN LRG LVL3 (GOWN DISPOSABLE) ×4 IMPLANT
GOWN STRL REIN XL XLG (GOWN DISPOSABLE) ×2 IMPLANT
KIT BASIN OR (CUSTOM PROCEDURE TRAY) ×2 IMPLANT
KIT ROOM TURNOVER OR (KITS) ×2 IMPLANT
NS IRRIG 1000ML POUR BTL (IV SOLUTION) ×2 IMPLANT
PAD ARMBOARD 7.5X6 YLW CONV (MISCELLANEOUS) ×2 IMPLANT
POUCH RETRIEVAL ECOSAC 10 (ENDOMECHANICALS) IMPLANT
POUCH RETRIEVAL ECOSAC 10MM (ENDOMECHANICALS)
POUCH SPECIMEN RETRIEVAL 10MM (ENDOMECHANICALS) ×2 IMPLANT
SCISSORS LAP 5X35 DISP (ENDOMECHANICALS) ×2 IMPLANT
SET CHOLANGIOGRAPH 5 50 .035 (SET/KITS/TRAYS/PACK) ×2 IMPLANT
SET IRRIG TUBING LAPAROSCOPIC (IRRIGATION / IRRIGATOR) ×2 IMPLANT
SLEEVE ENDOPATH XCEL 5M (ENDOMECHANICALS) ×2 IMPLANT
SPECIMEN JAR SMALL (MISCELLANEOUS) ×2 IMPLANT
SUT MON AB 5-0 PS2 18 (SUTURE) ×2 IMPLANT
TOWEL OR 17X24 6PK STRL BLUE (TOWEL DISPOSABLE) ×2 IMPLANT
TOWEL OR 17X26 10 PK STRL BLUE (TOWEL DISPOSABLE) ×2 IMPLANT
TRAY LAPAROSCOPIC (CUSTOM PROCEDURE TRAY) ×2 IMPLANT
TROCAR XCEL BLUNT TIP 100MML (ENDOMECHANICALS) ×2 IMPLANT
TROCAR XCEL NON-BLD 11X100MML (ENDOMECHANICALS) ×2 IMPLANT
TROCAR XCEL NON-BLD 5MMX100MML (ENDOMECHANICALS) ×2 IMPLANT

## 2013-03-29 NOTE — ED Provider Notes (Signed)
I saw and evaluated the patient, reviewed the resident's note and I agree with the findings and plan.   EKG Interpretation   Date/Time:  Tuesday March 28 2013 09:47:57 EST Ventricular Rate:  84 PR Interval:  147 QRS Duration: 73 QT Interval:  377 QTC Calculation: 446 R Axis:   56 Text Interpretation:  Sinus rhythm `no acute st/t changes No significant  change since last tracing Confirmed by Alfonsa Vaile  MD, Lennette Bihari ([id number]) on  03/28/2013 9:51:46 AM      Pt c/o ruq/right costal margin area pain, radiating to back, for past several days. Pain constant, dull, mod-severe. Nausea. No fever. abd soft nt. Chest cta. Spine nt. No rash/shingles to area of pain. Pt notes prior and remote hx similar pain without dx.  Had recent u/s neg. Labs. Ct. Discussed possible outpt gen surgery follow up/HIDA scan w pt.  Pt requests inpt tx/workup/surgery consult. gen surgery called - will get HIDA.  Mirna Mires, MD 03/29/13 [id number]

## 2013-03-29 NOTE — Preoperative (Signed)
Beta Blockers   Reason not to administer Beta Blockers:Not Applicable 

## 2013-03-29 NOTE — Transfer of Care (Signed)
Immediate Anesthesia Transfer of Care Note  Patient: [name]  Procedure(s) Performed: Procedure(s): LAPAROSCOPIC CHOLECYSTECTOMY WITH INTRAOPERATIVE CHOLANGIOGRAM (N/A)  Patient Location: PACU  Anesthesia Type:General  Level of Consciousness: awake, alert  and oriented  Airway & Oxygen Therapy: Patient Spontanous Breathing and Patient connected to nasal cannula oxygen  Post-op Assessment: Report given to PACU RN and Post -op Vital signs reviewed and stable  Post vital signs: Reviewed and stable  Complications: No apparent anesthesia complications

## 2013-03-29 NOTE — Progress Notes (Signed)
UR completed 

## 2013-03-29 NOTE — H&P (Signed)
Patient interviewed and examined, agree with PA note above.  Edward Jolly MD, FACS  03/29/2013 8:05 PM

## 2013-03-29 NOTE — Op Note (Signed)
Preoperative Diagnosis: RUQ abdominal pain CHOLECYSTITIS  Postoprative Diagnosis: RUQ abdominal pain CHOLECYSTITIS  Procedure: Procedure(s): LAPAROSCOPIC CHOLECYSTECTOMY WITH INTRAOPERATIVE CHOLANGIOGRAM   Surgeon: Excell Seltzer T   Assistants: None  Anesthesia:  General endotracheal anesthesia  Indications: Patient with persistent RUQ pain, negative [place] but HIDA showing markedly reduced ejection fraction. After discussion detailed elsewhere we have elected to proceed with lap chole in attempt to relieve her Sxs   Description of procedure: The patient brought to the operating room, placed in the supine position on the operating table, and general endotracheal anesthesia induced. The abdomen was widely sterilely prepped and draped. The patient had received preoperative IV antibiotics and PAS were in place. Patient timeout was performed the correct procedure verified. Standard 4 port technique was used with an open Hassan cannula at the umbilicus and the remainder of the ports placed under direct vision. The gallbladder was visualized. It appeared distended with mild thickening and edema. The fundus was grasped and elevated up over the liver and the infundibulum retracted inferiolaterally. Peritoneum anterior and posterior to close triangle was incised and fibrofatty tissue stripped off the neck of the gallbladder toward the porta hepatis. The distal gallbladder was thoroughly dissected. The cystic artery was identified in close triangle and the cystic duct gallbladder junction dissected 360.  A good critical view was obtained. When the anatomy was clear the cystic duct was clipped at the gallbladder junction and an operative attempted but I could not advance the cholangiocath after several attempts due to an apparent valve. The cystic duct was doubly clipped proximally and divided. The cystic artery was doubly clipped proximally and distally and divided. The gallbladder was dissected free from  its bed using hook cautery and removed through the umbilical port site. Complete hemostasis was obtained in the gallbladder bed. The right upper quadrant was thoroughly irrigated and hemostasis assured. Trochars were removed and all CO2 evacuated and the [facility] trocar site fascial defect closed. Skin incisions were closed with subcuticular Monocryl and Dermabond. Sponge needle and instrument counts were correct. The patient was taken to PACU in good condition.   Findings: As above  Estimated Blood Loss:  Minimal         Drains: none  Blood Given: none          Specimens: GB and contents        Complications:  * No complications entered in OR log *         Disposition: PACU - hemodynamically stable.         Condition: stable

## 2013-03-29 NOTE — Anesthesia Preprocedure Evaluation (Signed)
Anesthesia Evaluation  Patient identified by MRN, date of birth, ID band Patient awake    Reviewed: Allergy & Precautions, H&P , NPO status , Patient's Chart, lab work & pertinent test results  Airway Mallampati: I TM Distance: >3 FB Neck ROM: Full    Dental   Pulmonary          Cardiovascular hypertension, Pt. on medications     Neuro/Psych Anxiety Depression    GI/Hepatic GERD-  Medicated and Controlled,  Endo/Other    Renal/GU      Musculoskeletal   Abdominal   Peds  Hematology   Anesthesia Other Findings   Reproductive/Obstetrics                           Anesthesia Physical Anesthesia Plan  ASA: II  Anesthesia Plan: General   Post-op Pain Management:    Induction: Intravenous  Airway Management Planned: Oral ETT  Additional Equipment:   Intra-op Plan:   Post-operative Plan: Extubation in OR  Informed Consent: I have reviewed the patients History and Physical, chart, labs and discussed the procedure including the risks, benefits and alternatives for the proposed anesthesia with the patient or authorized representative who has indicated his/her understanding and acceptance.     Plan Discussed with: CRNA and Surgeon  Anesthesia Plan Comments:         Anesthesia Quick Evaluation

## 2013-03-29 NOTE — Progress Notes (Signed)
[name], female   DOB: Dec 07, 1940, 73 y.o.   MRN: [id number]    Subjective: Has continued right upper quadrant and right flank pain this morning unchanged.  Objective: Vital signs in last 24 hours: Temp:  [97.6 F (36.4 C)-98.5 F (36.9 C)] 98.5 F (36.9 C) (03/04 0549) Pulse Rate:  [64-117] 85 (03/04 0549) Resp:  [12-26] 17 (03/04 0549) BP: (106-167)/(52-111) 120/59 mmHg (03/04 0549) SpO2:  [92 %-100 %] 98 % (03/04 0549) Weight:  [164 lb (74.39 kg)-165 lb (74.844 kg)] 165 lb (74.844 kg) (03/03 0921) Last BM Date: 03/27/13  Intake/Output from previous day: 03/03 0701 - 03/04 0700 In: 940 [I.V.:940] Out: 250 [Urine:250] Intake/Output this shift:    General appearance: alert, cooperative and mild distress GI: mild to moderate epigastric and right upper quadrant tenderness unchanged from exam yesterday.  Lab Results:   Recent Labs  03/28/13 1015 03/29/13 0350  WBC 5.9 5.2  HGB 12.9 12.9  HCT 38.0 38.5  PLT 210 198   BMET  Recent Labs  03/28/13 1015 03/29/13 0350  NA 141 142  K 3.6* 4.0  CL 100 100  CO2 22 29  GLUCOSE 124* 147*  BUN 15 14  CREATININE 0.88 0.98  CALCIUM 9.7 9.6     Studies/Results: Dg Chest 2 View  03/28/2013   CLINICAL DATA:  Chest pain  EXAM: CHEST  2 VIEW  COMPARISON:  12/08/2010  FINDINGS: The heart size and mediastinal contours are within normal limits. Both lungs are clear. The visualized skeletal structures are unremarkable.  IMPRESSION: No active cardiopulmonary disease.   Electronically Signed   By: Franchot Gallo M.D.   On: 03/28/2013 10:36   Ct Angio Chest W/cm &/or Wo Cm  03/28/2013   CLINICAL DATA:  Comments right abdominal pain, right lower thoracic pain. Pain radiating to back since Friday. Increasing in severity.  EXAM: CT ANGIOGRAPHY CHEST  CT ABDOMEN AND PELVIS WITH CONTRAST  TECHNIQUE: Multidetector CT imaging of the chest was performed using the standard protocol during bolus administration of intravenous  contrast. Multiplanar CT image reconstructions and MIPs were obtained to evaluate the vascular anatomy. Multidetector CT imaging of the abdomen and pelvis was performed using the standard protocol during bolus administration of intravenous contrast.  CONTRAST:  68mL OMNIPAQUE IOHEXOL 350 MG/ML SOLN  COMPARISON:  US ABDOMEN COMPLETE dated 03/27/2013; CT ABD/PELVIS W CM dated 02/04/2010  FINDINGS: CTA CHEST FINDINGS  There are no filling defects within the pulmonary arteries to suggest acute pulmonary embolism. No acute findings aorta great vessels. Bones aortic dissection or transsection.  No pericardial fluid.  No mediastinal lymphadenopathy.  Review of the lung parenchyma demonstrates no pneumothorax or pleural fluid. No pulmonary infarction. No chest wall abnormality.  CT ABDOMEN and PELVIS FINDINGS  No focal hepatic lesion. The gallbladder, pancreas, spleen, and adrenal glands normal. There is bilateral nephrolithiasis without obstructive uropathy. No ureteral lithiasis  There is a calcification within the course of the distal measuring 3 mm (image 73, series 5). This calculus eccentric to the ureter but is new from comparison exam . Calculus is approximately 3 cm from the vesicoureteral junction. No bladder calculi.  Stomach, small bowel, cecum normal. The colon and rectosigmoid colon are normal.  Abdominal or is normal caliber. No retroperitoneal periportal lymphadenopathy. No free fluid the pelvis. No pelvic lymphadenopathy. Post hysterectomy. No aggressive osseous lesion.  Review of the MIP images confirms the above findings.  IMPRESSION: 1. No evidence of pulmonary embolism or aortic dissection. 2. Potential  nonobstructing calculus within the distal right ureter. This calculus is slightly eccentric to the ureter but is new from prior therefore concerning for a ureteral calculus. No obstructive uropathy. 3. Bilateral nephrolithiasis.   Electronically Signed   By: Suzy Bouchard M.D.   On: 03/28/2013 13:09    Nm Hepatobiliary  03/28/2013   CLINICAL DATA:  Right upper quadrant abdominal pain for 4 days.  EXAM: NUCLEAR MEDICINE HEPATOBILIARY IMAGING WITH GALLBLADDER EF  TECHNIQUE: Sequential images of the abdomen were obtained out to 60 minutes following intravenous administration of radiopharmaceutical. After oral ingestion of Ensure, gallbladder ejection fraction was determined. At 60 min, normal ejection fraction is greater than 33%.  COMPARISON:  Ultrasound 03/27/2013  RADIOPHARMACEUTICALS:  5.0 mCi Tc-74m Choletec  FINDINGS: There is symmetric uptake of the radiopharmaceutical in the liver and prompt excretion into the biliary tree which is visualized by 10 min. Activity is seen in the gallbladder at 25 min. Activity is seen in the small bowel after ingestion of Ensure. Gallbladder ejection fraction: 8.5%. Normal gallbladder ejection fraction with Ensure is greater than 33%. The patient did experience symptoms after oral ingestion of Ensure.  IMPRESSION: 1. Patent biliary system. 2. Low gallbladder ejection fraction of 8.5%.   Electronically Signed   By: Kalman Jewels M.D.   On: 03/28/2013 19:32   US Abdomen Complete  03/27/2013   CLINICAL DATA:  Right upper quadrant abdominal pain radiating into the back. History of renal calculi, diverticulosis and hysterectomy.  EXAM: ULTRASOUND ABDOMEN COMPLETE  COMPARISON:  DG ABDOMEN 1V dated 09/14/2012; CT ABD/PELVIS W CM dated 02/04/2010  FINDINGS: Gallbladder:  No gallstones or wall thickening visualized. No sonographic Murphy sign noted.  Common bile duct:  Diameter: 4 mm.  No evidence of choledocholithiasis.  Liver:  The hepatic echogenicity is increased without focal abnormality. The hepatic contours are normal.  IVC:  Partially obscured by bowel gas.  No demonstrated abnormality.  Pancreas:  Partially obscured by bowel gas.  No demonstrated abnormality.  Spleen:  Size and appearance within normal limits.  Right Kidney:  Length: 9.3 cm. Echogenicity within normal  limits. No mass or hydronephrosis visualized.  Left Kidney:  Length: 10.3 cm. There is a cyst in the upper pole measuring 1.8 cm. No other focal cortical abnormality or hydronephrosis is demonstrated. There are several echogenic shadowing foci consistent with calculi as correlated with prior CT.  Abdominal aorta:  No aneurysm visualized.  Other findings:  None.  IMPRESSION: 1. No acute abdominal findings identified. 2. Several echogenic foci in the left kidney correspond with calculi on prior CT. No hydronephrosis. 3. Probable hepatic steatosis. 4. Portions of the IVC, aorta and pancreas are obscured by bowel gas.   Electronically Signed   By: Camie Patience M.D.   On: 03/27/2013 15:58   Ct Abdomen Pelvis W Contrast  03/28/2013   CLINICAL DATA:  Comments right abdominal pain, right lower thoracic pain. Pain radiating to back since Friday. Increasing in severity.  EXAM: CT ANGIOGRAPHY CHEST  CT ABDOMEN AND PELVIS WITH CONTRAST  TECHNIQUE: Multidetector CT imaging of the chest was performed using the standard protocol during bolus administration of intravenous contrast. Multiplanar CT image reconstructions and MIPs were obtained to evaluate the vascular anatomy. Multidetector CT imaging of the abdomen and pelvis was performed using the standard protocol during bolus administration of intravenous contrast.  CONTRAST:  63mL OMNIPAQUE IOHEXOL 350 MG/ML SOLN  COMPARISON:  US ABDOMEN COMPLETE dated 03/27/2013; CT ABD/PELVIS W CM dated 02/04/2010  FINDINGS: CTA CHEST FINDINGS  There are no filling defects within the pulmonary arteries to suggest acute pulmonary embolism. No acute findings aorta great vessels. Bones aortic dissection or transsection.  No pericardial fluid.  No mediastinal lymphadenopathy.  Review of the lung parenchyma demonstrates no pneumothorax or pleural fluid. No pulmonary infarction. No chest wall abnormality.  CT ABDOMEN and PELVIS FINDINGS  No focal hepatic lesion. The gallbladder, pancreas, spleen, and  adrenal glands normal. There is bilateral nephrolithiasis without obstructive uropathy. No ureteral lithiasis  There is a calcification within the course of the distal measuring 3 mm (image 73, series 5). This calculus eccentric to the ureter but is new from comparison exam . Calculus is approximately 3 cm from the vesicoureteral junction. No bladder calculi.  Stomach, small bowel, cecum normal. The colon and rectosigmoid colon are normal.  Abdominal or is normal caliber. No retroperitoneal periportal lymphadenopathy. No free fluid the pelvis. No pelvic lymphadenopathy. Post hysterectomy. No aggressive osseous lesion.  Review of the MIP images confirms the above findings.  IMPRESSION: 1. No evidence of pulmonary embolism or aortic dissection. 2. Potential nonobstructing calculus within the distal right ureter. This calculus is slightly eccentric to the ureter but is new from prior therefore concerning for a ureteral calculus. No obstructive uropathy. 3. Bilateral nephrolithiasis.   Electronically Signed   By: Suzy Bouchard M.D.   On: 03/28/2013 13:09    Anti-infectives: Anti-infectives   None      Assessment/Plan: Recurrent and persistent epigastric and right upper quadrant abdominal pain entirely consistent with biliary tract disease. Workup includes negative gallbladder ultrasound and CT scan significant only for nonobstructive possible right ureteral stone. She has had multiple kidney stones and states this pain is entirely different.  She has no blood in her urine. She has had a previous negative endoscopy following an episode of right upper quadrant pain. HIDA scan now shows markedly reduced ejection fraction of 8.5%. With this constellation of findings I strongly suspect that she has biliary dyskinesia/cholecystitis were small stones unrecognized on ultrasound. I discussed options including further workup with repeat endoscopy versus going ahead with cholecystectomy in an effort to relieve her  symptoms. She would prefer to go ahead with surgery which I think is very reasonable in this situation. We discussed the nature of the surgery and risks of anesthetic complications, bleeding, infection, bile leak, bile duct injury and possible failure to relieve her symptoms. All her questions were answered.    LOS: 1 day    Deaaron Fulghum T 03/29/2013

## 2013-03-29 NOTE — Anesthesia Postprocedure Evaluation (Signed)
Anesthesia Post Note  Patient: [name]  Procedure(s) Performed: Procedure(s) (LRB): LAPAROSCOPIC CHOLECYSTECTOMY WITH INTRAOPERATIVE CHOLANGIOGRAM (N/A)  Anesthesia type: General  Patient location: PACU  Post pain: Pain level controlled and Adequate analgesia  Post assessment: Post-op Vital signs reviewed, Patient's Cardiovascular Status Stable, Respiratory Function Stable, Patent Airway and Pain level controlled  Last Vitals:  Filed Vitals:   03/29/13 1417  BP: 136/51  Pulse: 81  Temp: 36.8 C  Resp: 14    Post vital signs: Reviewed and stable  Level of consciousness: awake, alert  and oriented  Complications: No apparent anesthesia complications

## 2013-03-30 ENCOUNTER — Encounter (HOSPITAL_COMMUNITY): Payer: Self-pay | Admitting: General Surgery

## 2013-03-30 MED ORDER — HYDROCODONE-ACETAMINOPHEN 5-325 MG PO TABS: 1.0000 | ORAL_TABLET | Freq: Four times a day (QID) | ORAL | Status: DC | PRN

## 2013-03-30 MED ORDER — HYDROCODONE-ACETAMINOPHEN 5-325 MG PO TABS
1.0000 | ORAL_TABLET | ORAL | Status: DC | PRN
  Administered 2013-03-30: 1 via ORAL
  Filled 2013-03-30: qty 2

## 2013-03-30 NOTE — Discharge Instructions (Signed)
[facility] ______[facility] °LAPAROSCOPIC SURGERY: POST OP INSTRUCTIONS °Always review your discharge instruction sheet given to you by the facility where your surgery was performed. °IF YOU HAVE DISABILITY OR FAMILY LEAVE FORMS, YOU MUST BRING THEM TO THE OFFICE FOR PROCESSING.   °DO NOT GIVE THEM TO YOUR DOCTOR. ° °1. A prescription for pain medication may be given to you upon discharge.  Take your pain medication as prescribed, if needed.  If narcotic pain medicine is not needed, then you may take acetaminophen (Tylenol) or ibuprofen (Advil) as needed. °2. Take your usually prescribed medications unless otherwise directed. °3. If you need a refill on your pain medication, please contact your pharmacy.  They will contact our office to request authorization. Prescriptions will not be filled after 5pm or on week-ends. °4. You should follow a light diet the first few days after arrival home, such as soup and crackers, etc.  Be sure to include lots of fluids daily. °5. Most patients will experience some swelling and bruising in the area of the incisions.  Ice packs will help.  Swelling and bruising can take several days to resolve.  °6. It is common to experience some constipation if taking pain medication after surgery.  Increasing fluid intake and taking a stool softener (such as Colace) will usually help or prevent this problem from occurring.  A mild laxative (Milk of Magnesia or Miralax) should be taken according to package instructions if there are no bowel movements after 48 hours. °7. Unless discharge instructions indicate otherwise, you may remove your bandages 24-48 hours after surgery, and you may shower at that time.  You may have steri-strips (small skin tapes) in place directly over the incision.  These strips should be left on the skin for 7-10 days.  If your surgeon used skin glue on the incision, you may shower in 24 hours.  The glue will flake off over the next 2-3 weeks.  Any sutures or  staples will be removed at the office during your follow-up visit. °8. ACTIVITIES:  You may resume regular (light) daily activities beginning the next day--such as daily self-care, walking, climbing stairs--gradually increasing activities as tolerated.  You may have sexual intercourse when it is comfortable.  Refrain from any heavy lifting or straining until approved by your doctor. °a. You may drive when you are no longer taking prescription pain medication, you can comfortably wear a seatbelt, and you can safely maneuver your car and apply brakes. °b. RETURN TO WORK:  __________________________________________________________ °9. You should see your doctor in the office for a follow-up appointment approximately 2-3 weeks after your surgery.  Make sure that you call for this appointment within a day or two after you arrive home to insure a convenient appointment time. °10. OTHER INSTRUCTIONS: __________________________________________________________________________________________________________________________ __________________________________________________________________________________________________________________________ °WHEN TO CALL YOUR DOCTOR: °1. Fever over 101.0 °2. Inability to urinate °3. Continued bleeding from incision. °4. Increased pain, redness, or drainage from the incision. °5. Increasing abdominal pain ° °The clinic staff is available to answer your questions during regular business hours.  Please don’t hesitate to call and ask to speak to one of the nurses for clinical concerns.  If you have a medical emergency, go to the nearest emergency room or call 911.  A surgeon from [facility] is always on call at the hospital. °[address], [address], [place], [place]  [address] ? [address] [address], , [place]   [address] °[phone] ? [phone] ? FAX [phone] °Web site:   [email or web address] °

## 2013-03-30 NOTE — Discharge Summary (Signed)
Patient interviewed and examined, agree with PA note above. Patient doing well. Preoperative pain is relieved. Okay for discharge. Edward Jolly MD, FACS  03/30/2013 10:18 AM

## 2013-03-30 NOTE — Discharge Summary (Signed)
Physician Discharge Summary  Patient ID: LENNY KINGRY MRN: [id number] DOB/AGE: 1940/06/17 73 y.o.  Admit date: 03/28/2013 Discharge date: 03/30/2013  Admitting Diagnosis: RUQ abdominal pain Cholecystitis Biliary dyskinesia   Discharge Diagnosis Patient Active Problem List   Diagnosis Date Noted  . RUQ abdominal pain 03/28/2013  . Renal calculi 03/27/2013  . Diverticulosis 03/27/2013  . Other postablative hypothyroidism 12/26/2012  . Breast density 12/12/2012  . S/P hysterectomy 11/30/2011  . Multinodular goiter 11/30/2011  . Myalgia 11/30/2011  . GERD (gastroesophageal reflux disease) 11/30/2011  . Insomnia 07/23/2011  . History of anemia 05/10/2011  . History of herpes simplex infection 05/10/2011  . Anxiety 05/04/2011  . Essential hypertension, benign 05/04/2011  . Menopause 05/04/2011  . Osteoporosis 05/04/2011  . DJD (degenerative joint disease) 05/04/2011  . Diverticulitis of sigmoid colon 07/29/2010    Consultants None  Imaging: Dg Chest 2 View  03/28/2013   CLINICAL DATA:  Chest pain  EXAM: CHEST  2 VIEW  COMPARISON:  12/08/2010  FINDINGS: The heart size and mediastinal contours are within normal limits. Both lungs are clear. The visualized skeletal structures are unremarkable.  IMPRESSION: No active cardiopulmonary disease.   Electronically Signed   By: Franchot Gallo M.D.   On: 03/28/2013 10:36   Ct Angio Chest W/cm &/or Wo Cm  03/28/2013   CLINICAL DATA:  Comments right abdominal pain, right lower thoracic pain. Pain radiating to back since Friday. Increasing in severity.  EXAM: CT ANGIOGRAPHY CHEST  CT ABDOMEN AND PELVIS WITH CONTRAST  TECHNIQUE: Multidetector CT imaging of the chest was performed using the standard protocol during bolus administration of intravenous contrast. Multiplanar CT image reconstructions and MIPs were obtained to evaluate the vascular anatomy. Multidetector CT imaging of the abdomen and pelvis was performed using the standard protocol  during bolus administration of intravenous contrast.  CONTRAST:  91mL OMNIPAQUE IOHEXOL 350 MG/ML SOLN  COMPARISON:  US ABDOMEN COMPLETE dated 03/27/2013; CT ABD/PELVIS W CM dated 02/04/2010  FINDINGS: CTA CHEST FINDINGS  There are no filling defects within the pulmonary arteries to suggest acute pulmonary embolism. No acute findings aorta great vessels. Bones aortic dissection or transsection.  No pericardial fluid.  No mediastinal lymphadenopathy.  Review of the lung parenchyma demonstrates no pneumothorax or pleural fluid. No pulmonary infarction. No chest wall abnormality.  CT ABDOMEN and PELVIS FINDINGS  No focal hepatic lesion. The gallbladder, pancreas, spleen, and adrenal glands normal. There is bilateral nephrolithiasis without obstructive uropathy. No ureteral lithiasis  There is a calcification within the course of the distal measuring 3 mm (image 73, series 5). This calculus eccentric to the ureter but is new from comparison exam . Calculus is approximately 3 cm from the vesicoureteral junction. No bladder calculi.  Stomach, small bowel, cecum normal. The colon and rectosigmoid colon are normal.  Abdominal or is normal caliber. No retroperitoneal periportal lymphadenopathy. No free fluid the pelvis. No pelvic lymphadenopathy. Post hysterectomy. No aggressive osseous lesion.  Review of the MIP images confirms the above findings.  IMPRESSION: 1. No evidence of pulmonary embolism or aortic dissection. 2. Potential nonobstructing calculus within the distal right ureter. This calculus is slightly eccentric to the ureter but is new from prior therefore concerning for a ureteral calculus. No obstructive uropathy. 3. Bilateral nephrolithiasis.   Electronically Signed   By: Suzy Bouchard M.D.   On: 03/28/2013 13:09   Nm Hepatobiliary  03/28/2013   CLINICAL DATA:  Right upper quadrant abdominal pain for 4 days.  EXAM: NUCLEAR MEDICINE HEPATOBILIARY IMAGING  WITH GALLBLADDER EF  TECHNIQUE: Sequential images of the  abdomen were obtained out to 60 minutes following intravenous administration of radiopharmaceutical. After oral ingestion of Ensure, gallbladder ejection fraction was determined. At 60 min, normal ejection fraction is greater than 33%.  COMPARISON:  Ultrasound 03/27/2013  RADIOPHARMACEUTICALS:  5.0 mCi Tc-53m Choletec  FINDINGS: There is symmetric uptake of the radiopharmaceutical in the liver and prompt excretion into the biliary tree which is visualized by 10 min. Activity is seen in the gallbladder at 25 min. Activity is seen in the small bowel after ingestion of Ensure. Gallbladder ejection fraction: 8.5%. Normal gallbladder ejection fraction with Ensure is greater than 33%. The patient did experience symptoms after oral ingestion of Ensure.  IMPRESSION: 1. Patent biliary system. 2. Low gallbladder ejection fraction of 8.5%.   Electronically Signed   By: Kalman Jewels M.D.   On: 03/28/2013 19:32   Ct Abdomen Pelvis W Contrast  03/28/2013   CLINICAL DATA:  Comments right abdominal pain, right lower thoracic pain. Pain radiating to back since Friday. Increasing in severity.  EXAM: CT ANGIOGRAPHY CHEST  CT ABDOMEN AND PELVIS WITH CONTRAST  TECHNIQUE: Multidetector CT imaging of the chest was performed using the standard protocol during bolus administration of intravenous contrast. Multiplanar CT image reconstructions and MIPs were obtained to evaluate the vascular anatomy. Multidetector CT imaging of the abdomen and pelvis was performed using the standard protocol during bolus administration of intravenous contrast.  CONTRAST:  59mL OMNIPAQUE IOHEXOL 350 MG/ML SOLN  COMPARISON:  US ABDOMEN COMPLETE dated 03/27/2013; CT ABD/PELVIS W CM dated 02/04/2010  FINDINGS: CTA CHEST FINDINGS  There are no filling defects within the pulmonary arteries to suggest acute pulmonary embolism. No acute findings aorta great vessels. Bones aortic dissection or transsection.  No pericardial fluid.  No mediastinal lymphadenopathy.   Review of the lung parenchyma demonstrates no pneumothorax or pleural fluid. No pulmonary infarction. No chest wall abnormality.  CT ABDOMEN and PELVIS FINDINGS  No focal hepatic lesion. The gallbladder, pancreas, spleen, and adrenal glands normal. There is bilateral nephrolithiasis without obstructive uropathy. No ureteral lithiasis  There is a calcification within the course of the distal measuring 3 mm (image 73, series 5). This calculus eccentric to the ureter but is new from comparison exam . Calculus is approximately 3 cm from the vesicoureteral junction. No bladder calculi.  Stomach, small bowel, cecum normal. The colon and rectosigmoid colon are normal.  Abdominal or is normal caliber. No retroperitoneal periportal lymphadenopathy. No free fluid the pelvis. No pelvic lymphadenopathy. Post hysterectomy. No aggressive osseous lesion.  Review of the MIP images confirms the above findings.  IMPRESSION: 1. No evidence of pulmonary embolism or aortic dissection. 2. Potential nonobstructing calculus within the distal right ureter. This calculus is slightly eccentric to the ureter but is new from prior therefore concerning for a ureteral calculus. No obstructive uropathy. 3. Bilateral nephrolithiasis.   Electronically Signed   By: Suzy Bouchard M.D.   On: 03/28/2013 13:09    Procedures Dr. Excell Seltzer (03/29/13) - Laparoscopic Cholecystectomy with [facility]   Hospital Course:  73 y.o. patient with history of HTN, Iatrogenic Hypothyroidism, GERD, and Diverticulitis. She has a 4 day history of RUQ that radiates to her back and she presented to the [facility] after advised to do so by her PCP. Pt states the pain started on Friday evening, 03/24/13 after eating a homemade pizza with hamburger and pepperoni, with minimal spices. The pain has been constant since it started. Pt states she has had  several episodes of similar pain in the past, but these episodes were less severe, and went away after several hours. Pt c/o nausea  with no vomiting. She denies fever, chills, recent illness, recent trauma, urinary symptoms, or diarrhea. Pt does state she has been having reflux lately and was given a PPI by her PCP yesterday. She has had pain from reflux and Kidney stones before, and this pain doesn't feel anything like either. She endorses burping and a sour taste in her mouth. Pt denies smoking, caffeine, or use of NSAIDs. She uses alcohol only on occasion. Pts sister had a similar presentation many years ago, and the only positive finding were on a HIDA scan. Pt also states her sleep has been interrupted by the pain. Her last endoscopy and colonoscopy were both several years ago.  Workup showed negative ultrasound and CT scan, but HIDA scan showed 8% EF of the gallbladder consistent with biliary dyskinesia.  Patient was admitted and underwent procedure listed above and she was found to have a gallbladder consistent with cholecystitis.  Tolerated procedure well and was transferred to the floor.  Diet was advanced as tolerated.  On POD #1, the patient was voiding well, tolerating diet, ambulating well, pain well controlled, vital signs stable, incisions c/d/i and felt stable for discharge home.  Patient will follow up in our office in 2-3 weeks and knows to call with questions or concerns.  Physical Exam: General:  Alert, NAD, pleasant, comfortable Abd:  Soft, ND, mild tenderness, incisions C/D/I    Medication List         benazepril-hydrochlorthiazide 20-12.5 MG per tablet  Commonly known as:  LOTENSIN HCT  Take 1 tablet by mouth daily.     levothyroxine 112 MCG tablet  Commonly known as:  SYNTHROID, LEVOTHROID  Take 1 tablet (112 mcg total) by mouth daily.     mometasone 50 MCG/ACT nasal spray  Commonly known as:  NASONEX  Place 1 spray into the nose daily as needed (allergies).     OVER THE COUNTER MEDICATION  Take 1 tablet by mouth daily. DIGESTIVE ADVANTAGE     pantoprazole 40 MG tablet  Commonly known as:   PROTONIX  Take 1 tablet (40 mg total) by mouth daily.     promethazine 25 MG tablet  Commonly known as:  PHENERGAN  Take 1 tablet (25 mg total) by mouth every 8 (eight) hours as needed for nausea or vomiting.     RED YEAST RICE PO  Take 1 tablet by mouth daily.     traMADol 50 MG tablet  Commonly known as:  ULTRAM  Take 1 tablet (50 mg total) by mouth every 8 (eight) hours as needed.     valACYclovir 1000 MG tablet  Commonly known as:  VALTREX  Take 1,000 mg by mouth once as needed (fever blisters).        Follow-up Information   Follow up with [facility] On 04/25/2013. (For post-operation check.  Your appointment is at 1:30, please arrive at least 30 min before your appointment to complete your check in paperwork.  If you are unable to arrive 30 min prior to your appointment time we may have to cancel or reschedule you.)    Contact information:   [address]   [place] [address] [phone]         Signed: Excell Seltzer [facility] [phone]  03/30/2013, 9:57 AM

## 2013-04-02 ENCOUNTER — Encounter (HOSPITAL_BASED_OUTPATIENT_CLINIC_OR_DEPARTMENT_OTHER): Payer: Self-pay | Admitting: Emergency Medicine

## 2013-04-02 ENCOUNTER — Emergency Department (HOSPITAL_BASED_OUTPATIENT_CLINIC_OR_DEPARTMENT_OTHER)
Admission: EM | Admit: 2013-04-02 | Discharge: 2013-04-02 | Disposition: A | Discharge status: Home / Self Care | Payer: Medicare HMO | Attending: Emergency Medicine | Admitting: Emergency Medicine

## 2013-04-02 DIAGNOSIS — IMO0002 Reserved for concepts with insufficient information to code with codable children: Secondary | ICD-10-CM

## 2013-04-02 DIAGNOSIS — F329 Major depressive disorder, single episode, unspecified: Secondary | ICD-10-CM | POA: Insufficient documentation

## 2013-04-02 DIAGNOSIS — R109 Unspecified abdominal pain: Secondary | ICD-10-CM | POA: Insufficient documentation

## 2013-04-02 DIAGNOSIS — M1710 Unilateral primary osteoarthritis, unspecified knee: Secondary | ICD-10-CM | POA: Insufficient documentation

## 2013-04-02 DIAGNOSIS — M129 Arthropathy, unspecified: Secondary | ICD-10-CM | POA: Insufficient documentation

## 2013-04-02 DIAGNOSIS — E032 Hypothyroidism due to medicaments and other exogenous substances: Secondary | ICD-10-CM | POA: Insufficient documentation

## 2013-04-02 DIAGNOSIS — E0590 Thyrotoxicosis, unspecified without thyrotoxic crisis or storm: Secondary | ICD-10-CM | POA: Insufficient documentation

## 2013-04-02 DIAGNOSIS — G8918 Other acute postprocedural pain: Secondary | ICD-10-CM | POA: Insufficient documentation

## 2013-04-02 DIAGNOSIS — Z79899 Other long term (current) drug therapy: Secondary | ICD-10-CM | POA: Insufficient documentation

## 2013-04-02 DIAGNOSIS — F3289 Other specified depressive episodes: Secondary | ICD-10-CM | POA: Insufficient documentation

## 2013-04-02 DIAGNOSIS — B029 Zoster without complications: Secondary | ICD-10-CM

## 2013-04-02 DIAGNOSIS — K219 Gastro-esophageal reflux disease without esophagitis: Secondary | ICD-10-CM | POA: Insufficient documentation

## 2013-04-02 DIAGNOSIS — I10 Essential (primary) hypertension: Secondary | ICD-10-CM | POA: Insufficient documentation

## 2013-04-02 MED ORDER — ONDANSETRON 4 MG PO TBDP
4.0000 mg | ORAL_TABLET | Freq: Once | ORAL | Status: AC
  Administered 2013-04-02: 4 mg via ORAL
  Filled 2013-04-02: qty 1

## 2013-04-02 MED ORDER — OXYCODONE-ACETAMINOPHEN 5-325 MG PO TABS: 1.0000 | ORAL_TABLET | ORAL | Status: DC | PRN

## 2013-04-02 MED ORDER — HYDROMORPHONE HCL PF 1 MG/ML IJ SOLN
0.5000 mg | Freq: Once | INTRAMUSCULAR | Status: AC
Start: 2013-04-02 — End: 2013-04-02
  Administered 2013-04-02: 0.5 mg via INTRAMUSCULAR
  Filled 2013-04-02: qty 1

## 2013-04-02 NOTE — ED Notes (Signed)
Rash with blisters and pain

## 2013-04-02 NOTE — Discharge Instructions (Signed)
Shingles Shingles (herpes zoster) is an infection that is caused by the same virus that causes chickenpox (varicella). The infection causes a painful skin rash and fluid-filled blisters, which eventually break open, crust over, and heal. It may occur in any area of the body, but it usually affects only one side of the body or face. The pain of shingles usually lasts about 1 month. However, some people with shingles may develop long-term (chronic) pain in the affected area of the body. Shingles often occurs many years after the person had chickenpox. It is more common:  In people older than 50 years.  In people with weakened immune systems, such as those with HIV, AIDS, or cancer.  In people taking medicines that weaken the immune system, such as transplant medicines.  In people under great stress. CAUSES  Shingles is caused by the varicella zoster virus (VZV), which also causes chickenpox. After a person is infected with the virus, it can remain in the person's body for years in an inactive state (dormant). To cause shingles, the virus reactivates and breaks out as an infection in a nerve root. The virus can be spread from person to person (contagious) through contact with open blisters of the shingles rash. It will only spread to people who have not had chickenpox. When these people are exposed to the virus, they may develop chickenpox. They will not develop shingles. Once the blisters scab over, the person is no longer contagious and cannot spread the virus to others. SYMPTOMS  Shingles shows up in stages. The initial symptoms may be pain, itching, and tingling in an area of the skin. This pain is usually described as burning, stabbing, or throbbing.In a few days or weeks, a painful red rash will appear in the area where the pain, itching, and tingling were felt. The rash is usually on one side of the body in a band or belt-like pattern. Then, the rash usually turns into fluid-filled blisters. They  will scab over and dry up in approximately 2 3 weeks. Flu-like symptoms may also occur with the initial symptoms, the rash, or the blisters. These may include:  Fever.  Chills.  Headache.  Upset stomach. DIAGNOSIS  Your caregiver will perform a skin exam to diagnose shingles. Skin scrapings or fluid samples may also be taken from the blisters. This sample will be examined under a microscope or sent to a lab for further testing. TREATMENT  There is no specific cure for shingles. Your caregiver will likely prescribe medicines to help you manage the pain, recover faster, and avoid long-term problems. This may include antiviral drugs, anti-inflammatory drugs, and pain medicines. HOME CARE INSTRUCTIONS   Take a cool bath or apply cool compresses to the area of the rash or blisters as directed. This may help with the pain and itching.   Only take over-the-counter or prescription medicines as directed by your caregiver.   Rest as directed by your caregiver.  Keep your rash and blisters clean with mild soap and cool water or as directed by your caregiver.  Do not pick your blisters or scratch your rash. Apply an anti-itch cream or numbing creams to the affected area as directed by your caregiver.  Keep your shingles rash covered with a loose bandage (dressing).  Avoid skin contact with:  Babies.   Pregnant women.   Children with eczema.   Elderly people with transplants.   People with chronic illnesses, such as leukemia or AIDS.   Wear loose-fitting clothing to help ease   the pain of material rubbing against the rash.  Keep all follow-up appointments with your caregiver.If the area involved is on your face, you may receive a referral for follow-up to a specialist, such as an eye doctor (ophthalmologist) or an ear, nose, and throat (ENT) doctor. Keeping all follow-up appointments will help you avoid eye complications, chronic pain, or disability.  SEEK IMMEDIATE MEDICAL  CARE IF:   You have facial pain, pain around the eye area, or loss of feeling on one side of your face.  You have ear pain or ringing in your ear.  You have loss of taste.  Your pain is not relieved with prescribed medicines.   Your redness or swelling spreads.   You have more pain and swelling.  Your condition is worsening or has changed.   You have a feveror persistent symptoms for more than 2 3 days.  You have a fever and your symptoms suddenly get worse. MAKE SURE YOU:  Understand these instructions.  Will watch your condition.  Will get help right away if you are not doing well or get worse. Document Released: 01/12/2005 Document Revised: 10/07/2011 Document Reviewed: 08/27/2011 [employer] Patient Information 2014 [employer].  

## 2013-04-02 NOTE — ED Provider Notes (Signed)
CSN: [id number]     Arrival date & time 04/02/13  1055 History   First MD Initiated Contact with Patient 04/02/13 1137     Chief Complaint  Patient presents with  . Rash     (Consider location/radiation/quality/duration/timing/severity/associated sxs/prior Treatment) HPI Comments: Patient presents with a rash. She noted a slight erythematous rash that started on her chest wall about one week ago and over the last few days has progressed around her right chest and into her right back. She's having a lot of burning pain in this area. She denies any fevers or vomiting. She recently had her gallbladder removed a few days ago. She's taking hydrocodone for this pain but hasn't noted improvement of the shingles pain with hydrocodone.   Past Medical History  Diagnosis Date  . Hypertension   . Hyperthyroidism     nodule on thyroid, Radioactive, no hypo  . Arthritis     knees  . Depression   . Diverticulosis     bleeding  . Hypothyroidism, iatrogenic     After RI now hypo on synthroid  . Diverticulitis   . GERD (gastroesophageal reflux disease)    Past Surgical History  Procedure Laterality Date  . Bladder surgery      bladder suspension  . Abdominal hysterectomy  1977  . Total knee revision  12/17/2010    Procedure: TOTAL KNEE REVISION;  Surgeon: Dione Plover Aluisio;  Location: [facility] ORS;  Service: Orthopedics;  Laterality: Right;  . Joint replacement Right 2009    total knee replacement  . Rotator cuff repair Left   . Cholecystectomy N/A 03/29/2013    Procedure: LAPAROSCOPIC CHOLECYSTECTOMY WITH INTRAOPERATIVE CHOLANGIOGRAM;  Surgeon: Edward Jolly, MD;  Location: [facility] OR;  Service: General;  Laterality: N/A;   Family History  Problem Relation Age of Onset  . Diverticulitis Mother   . Colon cancer Maternal Grandmother   . Cancer Daughter    History  Substance Use Topics  . Smoking status: Never Smoker   . Smokeless tobacco: Never Used  . Alcohol Use: No   OB History   Grav  Para Term Preterm Abortions TAB SAB Ect Mult Living                 Review of Systems  Constitutional: Negative for fever, chills, diaphoresis and fatigue.  HENT: Negative for congestion, rhinorrhea and sneezing.   Eyes: Negative.   Respiratory: Negative for cough, chest tightness and shortness of breath.   Cardiovascular: Negative for chest pain and leg swelling.  Gastrointestinal: Positive for abdominal pain (Status post gallbladder removal. Pains improved since surgery). Negative for nausea, vomiting, diarrhea and blood in stool.  Genitourinary: Negative for frequency, hematuria, flank pain and difficulty urinating.  Musculoskeletal: Negative for arthralgias and back pain.  Skin: Positive for rash.  Neurological: Negative for dizziness, speech difficulty, weakness, numbness and headaches.      Allergies  Amitriptyline; Demerol; Demerol; Fish allergy; Nsaids; and Morphine and related  Home Medications   Current Outpatient Rx  Name  Route  Sig  Dispense  Refill  . benazepril-hydrochlorthiazide (LOTENSIN HCT) 20-12.5 MG per tablet   Oral   Take 1 tablet by mouth daily.         Marland Kitchen levothyroxine (SYNTHROID, LEVOTHROID) 112 MCG tablet   Oral   Take 1 tablet (112 mcg total) by mouth daily.   30 tablet   3   . mometasone (NASONEX) 50 MCG/ACT nasal spray   Nasal   Place 1 spray into the  nose daily as needed (allergies).         Marland Kitchen OVER THE COUNTER MEDICATION   Oral   Take 1 tablet by mouth daily. DIGESTIVE ADVANTAGE          . oxyCODONE-acetaminophen (PERCOCET) 5-325 MG per tablet   Oral   Take 1-2 tablets by mouth every 4 (four) hours as needed.   20 tablet   0   . pantoprazole (PROTONIX) 40 MG tablet   Oral   Take 1 tablet (40 mg total) by mouth daily.   30 tablet   3   . promethazine (PHENERGAN) 25 MG tablet   Oral   Take 1 tablet (25 mg total) by mouth every 8 (eight) hours as needed for nausea or vomiting.   20 tablet   0   . Red Yeast Rice Extract  (RED YEAST RICE PO)   Oral   Take 1 tablet by mouth daily.         . traMADol (ULTRAM) 50 MG tablet   Oral   Take 1 tablet (50 mg total) by mouth every 8 (eight) hours as needed.   30 tablet   0   . valACYclovir (VALTREX) 1000 MG tablet   Oral   Take 1,000 mg by mouth once as needed (fever blisters).           BP 148/100  Pulse 117  Temp(Src) 98.4 F (36.9 C) (Oral)  Resp 20  SpO2 98% Physical Exam  Constitutional: She is oriented to person, place, and time. She appears well-developed and well-nourished.  HENT:  Head: Normocephalic and atraumatic.  Eyes: Pupils are equal, round, and reactive to light.  Neck: Normal range of motion. Neck supple.  Cardiovascular: Normal rate, regular rhythm and normal heart sounds.   Pulmonary/Chest: Effort normal and breath sounds normal. No respiratory distress. She has no wheezes. She has no rales. She exhibits no tenderness.  Abdominal: Soft. Bowel sounds are normal. There is no tenderness. There is no rebound and no guarding.  Musculoskeletal: Normal range of motion. She exhibits no edema.  Lymphadenopathy:    She has no cervical adenopathy.  Neurological: She is alert and oriented to person, place, and time.  Skin: Skin is warm and dry. Rash (Positive erythematous blistering rash from the right lateral sternal area going along the rib cage over the right nipple to the right flank and right upper back area. It did not cross the midline. There is no evidence of cellulitis.) noted.  Psychiatric: She has a normal mood and affect.    ED Course  Procedures (including critical care time) Labs Review Labs Reviewed - No data to display Imaging Review No results found.   EKG Interpretation None      MDM   Final diagnoses:  Shingles    Patient's rash is consistent with shingles. She's out of the window for bowel tracts. I gave her prescription for oxycodone to try and see if it would work better than the hydrocodone. She's also  given a shot of Dilaudid in the ED prior to discharge. I encouraged her followup with her primary care physician this week for ongoing pain management.    Malvin Johns, MD 04/02/13 1316

## 2013-04-03 ENCOUNTER — Telehealth: Payer: Self-pay | Admitting: *Deleted

## 2013-04-03 ENCOUNTER — Telehealth: Payer: Self-pay | Admitting: Internal Medicine

## 2013-04-03 ENCOUNTER — Telehealth (INDEPENDENT_AMBULATORY_CARE_PROVIDER_SITE_OTHER): Payer: Self-pay | Admitting: *Deleted

## 2013-04-03 MED ORDER — VALACYCLOVIR HCL 1 G PO TABS: 1000.0000 mg | ORAL_TABLET | Freq: Two times a day (BID) | ORAL | Status: DC

## 2013-04-03 NOTE — Telephone Encounter (Signed)
Pt states that she was told by ER to call PCP for more options for treatment.

## 2013-04-03 NOTE — Telephone Encounter (Signed)
Patient called just to make Dr. Excell Seltzer aware that she has been diagnosed with shingles.  Patient had surgery on 03/29/13 and was diagnosed on 04/02/13.

## 2013-04-03 NOTE — Telephone Encounter (Signed)
[name] went to ED over weekend and was diagnosed with shingles.  They have her pain meds 20 pills that she is taking 2 every 4 hours.  She wanted to check with you to see if there is anything else that we can do for her.

## 2013-04-03 NOTE — Telephone Encounter (Signed)
Had shingles over weekend  Will treat with [place]  She is ok with percocer or vicodin

## 2013-04-05 ENCOUNTER — Other Ambulatory Visit: Payer: Self-pay | Admitting: Internal Medicine

## 2013-04-05 MED ORDER — OXYCODONE-ACETAMINOPHEN 5-325 MG PO TABS: 1.0000 | ORAL_TABLET | ORAL | Status: DC | PRN

## 2013-04-05 NOTE — Progress Notes (Signed)
Pt needs refill of pain med  Given #20

## 2013-04-06 ENCOUNTER — Telehealth (INDEPENDENT_AMBULATORY_CARE_PROVIDER_SITE_OTHER): Payer: Self-pay

## 2013-04-06 NOTE — Telephone Encounter (Signed)
Incoming call from patient's daughter Joycelyn Schmid)  into the office to report that her mother (patient) has no appetite for the last 2 days, severe diarrhea and low grade fever (99.3).  Daughter reports incision appears to be healing well.  Daughter reports no redness, drainage or abdominal swelling, nausea or vomiting.  Patient s/p Laparoscopic Cholecystectomy w/IOC on 03/29/13.  Daughter concerned about mother's well being.  Erik Obey (daughter) that patient need's to go to the ED for further work-up and assessment.  Daughter verbalized understanding and plans to take her mother to [facility].

## 2013-04-11 ENCOUNTER — Other Ambulatory Visit: Payer: Self-pay | Admitting: Internal Medicine

## 2013-04-11 NOTE — Telephone Encounter (Signed)
Refill request

## 2013-04-17 ENCOUNTER — Telehealth: Payer: Self-pay | Admitting: *Deleted

## 2013-04-17 ENCOUNTER — Other Ambulatory Visit: Payer: Self-pay | Admitting: *Deleted

## 2013-04-17 NOTE — Telephone Encounter (Signed)
Pt request a refill for shingles pain

## 2013-04-17 NOTE — Telephone Encounter (Signed)
[name] would like a refill on the oxycodone for her shingles.  Please give her a call.

## 2013-04-18 ENCOUNTER — Telehealth: Payer: Self-pay | Admitting: *Deleted

## 2013-04-18 MED ORDER — OXYCODONE-ACETAMINOPHEN 5-325 MG PO TABS: 1.0000 | ORAL_TABLET | ORAL | Status: DC | PRN

## 2013-04-18 NOTE — Telephone Encounter (Signed)
Refill notification

## 2013-04-22 ENCOUNTER — Telehealth: Payer: Self-pay | Admitting: Endocrinology

## 2013-04-24 ENCOUNTER — Encounter: Payer: Self-pay | Admitting: Internal Medicine

## 2013-04-24 ENCOUNTER — Ambulatory Visit (INDEPENDENT_AMBULATORY_CARE_PROVIDER_SITE_OTHER): Payer: Medicare PPO | Admitting: Internal Medicine

## 2013-04-24 VITALS — BP 111/63 | HR 92 | Temp 98.2°F | Resp 18

## 2013-04-24 DIAGNOSIS — B029 Zoster without complications: Secondary | ICD-10-CM

## 2013-04-24 DIAGNOSIS — B0229 Other postherpetic nervous system involvement: Secondary | ICD-10-CM | POA: Insufficient documentation

## 2013-04-24 MED ORDER — OXYCODONE-ACETAMINOPHEN 7.5-325 MG PO TABS: ORAL_TABLET | ORAL | Status: DC

## 2013-04-24 MED ORDER — LIDOCAINE 5 % EX PTCH: 1.0000 | MEDICATED_PATCH | CUTANEOUS | Status: DC

## 2013-04-24 NOTE — Progress Notes (Signed)
Subjective:    [name], female    DOB: 01/19/1941, 73 y.o.   MRN: [id number]  HPI  [name] is here for acute visit.   She is now near 4 weeks post shingles outbreak and still having severe pain.  Lots of itching  Pain is not any less despite rash nearly healed.  She is on low dose Percocet 5/325.  Pt teary in room   Allergies  Allergen Reactions  . Amitriptyline Other (See Comments)    Intolerance, hallucinations  . Demerol Other (See Comments)    migraines  . Demerol [Meperidine]   . Fish Allergy Nausea Only  . Nsaids     Lower GI bleeding  . Morphine And Related Rash   Past Medical History  Diagnosis Date  . Hypertension   . Hyperthyroidism     nodule on thyroid, Radioactive, no hypo  . Arthritis     knees  . Depression   . Diverticulosis     bleeding  . Hypothyroidism, iatrogenic     After RI now hypo on synthroid  . Diverticulitis   . GERD (gastroesophageal reflux disease)    Past Surgical History  Procedure Laterality Date  . Bladder surgery      bladder suspension  . Abdominal hysterectomy  1977  . Total knee revision  12/17/2010    Procedure: TOTAL KNEE REVISION;  Surgeon: Dione Plover Aluisio;  Location: [facility];  Service: Orthopedics;  Laterality: Right;  . Joint replacement Right 2009    total knee replacement  . Rotator cuff repair Left   . Cholecystectomy N/A 03/29/2013    Procedure: LAPAROSCOPIC CHOLECYSTECTOMY WITH INTRAOPERATIVE CHOLANGIOGRAM;  Surgeon: Edward Jolly, MD;  Location: [facility];  Service: General;  Laterality: N/A;   History   Social History  . Marital Status: Married    Spouse Name: N/A    Number of Children: N/A  . Years of Education: N/A   Occupational History  . Not on file.   Social History Main Topics  . Smoking status: Never Smoker   . Smokeless tobacco: Never Used  . Alcohol Use: No  . Drug Use: No  . Sexual Activity: Not Currently   Other Topics Concern  . Not on file   Social History Narrative    . No narrative on file   Family History  Problem Relation Age of Onset  . Diverticulitis Mother   . Colon cancer Maternal Grandmother   . Cancer Daughter    Patient Active Problem List   Diagnosis Date Noted  . RUQ abdominal pain 03/28/2013  . Renal calculi 03/27/2013  . Diverticulosis 03/27/2013  . Other postablative hypothyroidism 12/26/2012  . Breast density 12/12/2012  . S/P hysterectomy 11/30/2011  . Multinodular goiter 11/30/2011  . Myalgia 11/30/2011  . GERD (gastroesophageal reflux disease) 11/30/2011  . Insomnia 07/23/2011  . History of anemia 05/10/2011  . History of herpes simplex infection 05/10/2011  . Anxiety 05/04/2011  . Essential hypertension, benign 05/04/2011  . Menopause 05/04/2011  . Osteoporosis 05/04/2011  . DJD (degenerative joint disease) 05/04/2011  . Diverticulitis of sigmoid colon 07/29/2010   Current Outpatient Prescriptions on File Prior to Visit  Medication Sig Dispense Refill  . benazepril-hydrochlorthiazide (LOTENSIN HCT) 20-12.5 MG per tablet Take 1 tablet by mouth daily.      Marland Kitchen levothyroxine (SYNTHROID, LEVOTHROID) 112 MCG tablet TAKE 1 TABLET EVERY DAY  30 tablet  3  . mometasone (NASONEX) 50 MCG/ACT nasal spray Place 1 spray into the  nose daily as needed (allergies).      Marland Kitchen OVER THE COUNTER MEDICATION Take 1 tablet by mouth daily. DIGESTIVE ADVANTAGE       . pantoprazole (PROTONIX) 40 MG tablet Take 1 tablet (40 mg total) by mouth daily.  30 tablet  3  . Red Yeast Rice Extract (RED YEAST RICE PO) Take 1 tablet by mouth daily.      Marland Kitchen oxyCODONE-acetaminophen (PERCOCET) 5-325 MG per tablet Take 1-2 tablets by mouth every 4 (four) hours as needed.  20 tablet  0  . promethazine (PHENERGAN) 25 MG tablet Take 1 tablet (25 mg total) by mouth every 8 (eight) hours as needed for nausea or vomiting.  20 tablet  0  . traMADol (ULTRAM) 50 MG tablet Take 1 tablet (50 mg total) by mouth every 8 (eight) hours as needed.  30 tablet  0   No current  facility-administered medications on file prior to visit.      Review of Systems    See HPI Objective:   Physical Exam  Physical Exam  Nursing note and vitals reviewed.  Constitutional: She is oriented to person, place, and time. She appears well-developed and well-nourished.  HENT:  Head: Normocephalic and atraumatic.  Cardiovascular: Normal rate and regular rhythm. Exam reveals no gallop and no friction rub.  No murmur heard.  Pulmonary/Chest: Breath sounds normal. She has no wheezes. She has no rales.  Neurological: She is alert and oriented to person, place, and time.  Skin: Skin is warm and dry.   R thoracic chest wall.  NO blisters. Healing pink skin lesions extending dermatomal pattern underneath   r Breast  All lesion apear to have dried.  Linear excoriations due to scratching Psychiatric: She has a normal mood and affect. Her behavior is normal.             Assessment & Plan:  Post herpetic neuralgia:  Will increase Percocet to 7.5 mg /25 and will give Lidoderm patch  One daily.  Will refer to neurology as well  Shingles rash healing well.  She has received vaccine

## 2013-04-24 NOTE — Patient Instructions (Signed)
Will set up with [facility] neurology  First available   See me in 2 weeks or sooner prn  30 min

## 2013-04-24 NOTE — Telephone Encounter (Signed)
Please refill x 1 Ov is due  

## 2013-04-25 ENCOUNTER — Encounter (INDEPENDENT_AMBULATORY_CARE_PROVIDER_SITE_OTHER): Payer: Self-pay | Admitting: General Surgery

## 2013-04-25 ENCOUNTER — Telehealth: Payer: Self-pay | Admitting: *Deleted

## 2013-04-25 ENCOUNTER — Ambulatory Visit (INDEPENDENT_AMBULATORY_CARE_PROVIDER_SITE_OTHER): Payer: Medicare PPO | Admitting: General Surgery

## 2013-04-25 VITALS — BP 112/70 | HR 64 | Temp 98.2°F | Resp 14 | Ht 63.0 in | Wt 161.0 lb

## 2013-04-25 DIAGNOSIS — K828 Other specified diseases of gallbladder: Secondary | ICD-10-CM

## 2013-04-25 NOTE — Telephone Encounter (Signed)
Called pt to advise her that we have a few samples in the office LVm message

## 2013-04-25 NOTE — Patient Instructions (Signed)
Call us and come back if you have any issues.

## 2013-04-25 NOTE — Progress Notes (Signed)
[name] 01/07/1941 [id number] 04/25/2013   [name] is a 73 y.o. female who had a laparoscopic cholecystectomy with intraoperative cholangiogram by Dr. Excell Seltzer.  The pathology report confirmed; Gallbladder - BENIGN GALLBLADDER WITH MINIMAL CHRONIC INFLAMMATION. - NEGATIVE FOR CHOLELITHIASIS. Specimen Gross and Clinical Information     The patient reports that they are feeling well with normal bowel movements and good appetite.  The pre-operative symptoms of abdominal pain, nausea, and vomiting have resolved.   Her only complaint currently is from her Shingles.  Physical examination - BP 112/70  Pulse 64  Temp(Src) 98.2 F (36.8 C) (Oral)  Resp 14  Ht 5\' 3"  (1.6 m)  Wt 73.029 kg (161 lb)  BMI 28.53 kg/m2  Incisions appear well-healed with no sign of infection or bleeding.   Abdomen - soft, non-tender  Impression:  s/p laparoscopic cholecystectomy  Plan:  She may resume a regular diet and full activity.  She may follow-up on a PRN basis.  RUQ abdominal pain  Cholecystitis  Biliary dyskinesia Recent bout of Shingles with post herpetic neuralgia postablative hypothyroidism  GERD  Essential hypertension Depression

## 2013-05-08 ENCOUNTER — Ambulatory Visit: Payer: Medicare PPO | Admitting: Internal Medicine

## 2013-05-15 ENCOUNTER — Encounter: Payer: Self-pay | Admitting: Internal Medicine

## 2013-05-15 ENCOUNTER — Ambulatory Visit (INDEPENDENT_AMBULATORY_CARE_PROVIDER_SITE_OTHER): Payer: Medicare PPO | Admitting: Internal Medicine

## 2013-05-15 VITALS — BP 121/66 | HR 99 | Temp 98.2°F | Resp 18 | Wt 158.0 lb

## 2013-05-15 DIAGNOSIS — B029 Zoster without complications: Secondary | ICD-10-CM

## 2013-05-15 DIAGNOSIS — B0229 Other postherpetic nervous system involvement: Secondary | ICD-10-CM

## 2013-05-15 NOTE — Progress Notes (Signed)
Subjective:    [name], female    DOB: Apr 28, 1940, 73 y.o.   MRN: [id number]  HPI  [name] is here for follow up.  She feels 75-80% improved.  She is back to her baseline Tramadol   And is not using Lidoderm   Now   Allergies  Allergen Reactions  . Amitriptyline Other (See Comments)    Intolerance, hallucinations  . Demerol Other (See Comments)    migraines  . Demerol [Meperidine]   . Fish Allergy Nausea Only  . Nsaids     Lower GI bleeding  . Morphine And Related Rash   Past Medical History  Diagnosis Date  . Hypertension   . Hyperthyroidism     nodule on thyroid, Radioactive, no hypo  . Arthritis     knees  . Depression   . Diverticulosis     bleeding  . Hypothyroidism, iatrogenic     After RI now hypo on synthroid  . Diverticulitis   . GERD (gastroesophageal reflux disease)    Past Surgical History  Procedure Laterality Date  . Bladder surgery      bladder suspension  . Abdominal hysterectomy  1977  . Total knee revision  12/17/2010    Procedure: TOTAL KNEE REVISION;  Surgeon: Dione Plover Aluisio;  Location: [facility];  Service: Orthopedics;  Laterality: Right;  . Joint replacement Right 2009    total knee replacement  . Rotator cuff repair Left   . Cholecystectomy N/A 03/29/2013    Procedure: LAPAROSCOPIC CHOLECYSTECTOMY WITH INTRAOPERATIVE CHOLANGIOGRAM;  Surgeon: Edward Jolly, MD;  Location: [place];  Service: General;  Laterality: N/A;   History   Social History  . Marital Status: Married    Spouse Name: N/A    Number of Children: N/A  . Years of Education: N/A   Occupational History  . Not on file.   Social History Main Topics  . Smoking status: Never Smoker   . Smokeless tobacco: Never Used  . Alcohol Use: No  . Drug Use: No  . Sexual Activity: Not Currently   Other Topics Concern  . Not on file   Social History Narrative  . No narrative on file   Family History  Problem Relation Age of Onset  . Diverticulitis Mother     . Colon cancer Maternal Grandmother   . Cancer Maternal Grandmother     colon  . Cancer Daughter     breast   Patient Active Problem List   Diagnosis Date Noted  . Post herpetic neuralgia 04/24/2013  . Shingles 04/24/2013  . RUQ abdominal pain 03/28/2013  . Renal calculi 03/27/2013  . Diverticulosis 03/27/2013  . Other postablative hypothyroidism 12/26/2012  . Breast density 12/12/2012  . S/P hysterectomy 11/30/2011  . Multinodular goiter 11/30/2011  . Myalgia 11/30/2011  . GERD (gastroesophageal reflux disease) 11/30/2011  . Insomnia 07/23/2011  . History of anemia 05/10/2011  . History of herpes simplex infection 05/10/2011  . Anxiety 05/04/2011  . Essential hypertension, benign 05/04/2011  . Menopause 05/04/2011  . Osteoporosis 05/04/2011  . DJD (degenerative joint disease) 05/04/2011  . Diverticulitis of sigmoid colon 07/29/2010   Current Outpatient Prescriptions on File Prior to Visit  Medication Sig Dispense Refill  . benazepril-hydrochlorthiazide (LOTENSIN HCT) 20-12.5 MG per tablet Take 1 tablet by mouth daily.      Marland Kitchen levothyroxine (SYNTHROID, LEVOTHROID) 112 MCG tablet TAKE 1 TABLET EVERY DAY  30 tablet  3  . lidocaine (LIDODERM) 5 % Place 1 patch  onto the skin daily. Remove & Discard patch within 12 hours or as directed by MD  30 patch  0  . mometasone (NASONEX) 50 MCG/ACT nasal spray Place 1 spray into the nose daily as needed (allergies).      Marland Kitchen OVER THE COUNTER MEDICATION Take 1 tablet by mouth daily. DIGESTIVE ADVANTAGE       . oxyCODONE-acetaminophen (PERCOCET) 7.5-325 MG per tablet Take one tablet every 6 hours as needed for pain  30 tablet  0  . pantoprazole (PROTONIX) 40 MG tablet Take 1 tablet (40 mg total) by mouth daily.  30 tablet  3  . promethazine (PHENERGAN) 25 MG tablet Take 1 tablet (25 mg total) by mouth every 8 (eight) hours as needed for nausea or vomiting.  20 tablet  0  . Red Yeast Rice Extract (RED YEAST RICE PO) Take 1 tablet by mouth daily.        No current facility-administered medications on file prior to visit.       Review of Systems See HPI    Objective:   Physical Exam  Physical Exam  Nursing note and vitals reviewed.  Constitutional: She is oriented to person, place, and time. She appears well-developed and well-nourished.  HENT:  Head: Normocephalic and atraumatic.  Cardiovascular: Normal rate and regular rhythm. Exam reveals no gallop and no friction rub.  No murmur heard.  Pulmonary/Chest: Breath sounds normal. She has no wheezes. She has no rales.  Neurological: She is alert and oriented to person, place, and time.  Skin: Skin is warm and dry.  Psychiatric: She has a normal mood and affect. Her behavior is normal.        Assessment & Plan:  Shingles  Improving  Post herpetic neuralgia improving  Continue Tramadol prn  Schedule CPE

## 2013-05-15 NOTE — Patient Instructions (Signed)
See me as needed  Schedule CPE

## 2013-05-23 ENCOUNTER — Telehealth: Payer: Self-pay | Admitting: *Deleted

## 2013-05-23 NOTE — Telephone Encounter (Signed)
[name] wants a refill on Tramadol.  Please call her so she can pick it up.

## 2013-05-23 NOTE — Telephone Encounter (Signed)
Pt states that she needs a refill of tramadol I do not see this listed as one of her meds. Last AVS was to continue tramadol PRN.

## 2013-05-24 ENCOUNTER — Other Ambulatory Visit: Payer: Self-pay | Admitting: Internal Medicine

## 2013-05-24 ENCOUNTER — Ambulatory Visit: Payer: Medicare PPO | Admitting: Neurology

## 2013-05-24 ENCOUNTER — Telehealth: Payer: Self-pay | Admitting: *Deleted

## 2013-05-24 DIAGNOSIS — M1990 Unspecified osteoarthritis, unspecified site: Secondary | ICD-10-CM

## 2013-05-24 MED ORDER — TRAMADOL HCL 50 MG PO TABS: 50.0000 mg | ORAL_TABLET | Freq: Three times a day (TID) | ORAL | Status: DC | PRN

## 2013-05-24 NOTE — Telephone Encounter (Signed)
REFILLED TRAMADOL PER DR. Coralyn Mark REQ

## 2013-06-21 ENCOUNTER — Telehealth: Payer: Self-pay | Admitting: *Deleted

## 2013-06-21 NOTE — Telephone Encounter (Signed)
[name] would like to change to tylenol 3 with Codeine because tramadol is no longer working.  [facility].

## 2013-06-22 MED ORDER — ACETAMINOPHEN-CODEINE #3 300-30 MG PO TABS: ORAL_TABLET | ORAL | Status: DC

## 2013-06-22 NOTE — Telephone Encounter (Signed)
See Heather's note 

## 2013-06-22 NOTE — Telephone Encounter (Signed)
Medication called in to [facility]

## 2013-07-31 ENCOUNTER — Telehealth: Payer: Self-pay | Admitting: *Deleted

## 2013-07-31 MED ORDER — ACETAMINOPHEN-CODEINE #3 300-30 MG PO TABS: ORAL_TABLET | ORAL | Status: DC

## 2013-07-31 NOTE — Telephone Encounter (Signed)
Called Tylenol #3 into pharmacy - Pt already has CPE scheduled

## 2013-07-31 NOTE — Telephone Encounter (Signed)
Needs refill on her Tylenol #3

## 2013-09-03 ENCOUNTER — Other Ambulatory Visit: Payer: Self-pay | Admitting: Endocrinology

## 2013-09-05 ENCOUNTER — Telehealth: Payer: Self-pay | Admitting: *Deleted

## 2013-09-05 ENCOUNTER — Encounter: Payer: Self-pay | Admitting: *Deleted

## 2013-09-05 DIAGNOSIS — R21 Rash and other nonspecific skin eruption: Secondary | ICD-10-CM

## 2013-09-05 NOTE — Telephone Encounter (Signed)
Pt called in requesting a referral to Dr.Bowers a Dermatologist That is in her network  To be seen for a place on her shin .

## 2013-09-05 NOTE — Telephone Encounter (Signed)
Dr Orbie Hurst and Dr Linus Galas are both on her insurance list. Please make referral to whoever she can get into the soonest per Dr Coralyn Mark

## 2013-09-05 NOTE — Telephone Encounter (Signed)
Heather   Call pt . To clarify.  Dr. Harlow Mares is a plastic surgeon not a dermatologist.  Clarify the referral and let me know

## 2013-10-03 ENCOUNTER — Other Ambulatory Visit: Payer: Self-pay

## 2013-10-03 NOTE — Telephone Encounter (Signed)
[name] [id number] [facility]  Charlett Nose called and needs her acetaminophen-codeine (TYLENOL #3) 300-30 MG per tablet refilled.

## 2013-10-03 NOTE — Telephone Encounter (Signed)
Refill request

## 2013-10-04 ENCOUNTER — Other Ambulatory Visit: Payer: Self-pay | Admitting: Internal Medicine

## 2013-10-04 MED ORDER — ACETAMINOPHEN-CODEINE #3 300-30 MG PO TABS: ORAL_TABLET | ORAL | Status: DC

## 2013-10-04 NOTE — Telephone Encounter (Signed)
Refill request for Synthroid

## 2013-10-04 NOTE — Telephone Encounter (Signed)
Called into [facility]-eh

## 2013-10-05 ENCOUNTER — Other Ambulatory Visit: Payer: Self-pay | Admitting: *Deleted

## 2013-10-05 DIAGNOSIS — E038 Other specified hypothyroidism: Secondary | ICD-10-CM

## 2013-10-05 MED ORDER — LEVOTHYROXINE SODIUM 112 MCG PO TABS: 112.0000 ug | ORAL_TABLET | Freq: Every day | ORAL | Status: DC

## 2013-10-05 NOTE — Telephone Encounter (Signed)
I spoke with Charlett Nose and she asked that we refill this for 1 month. Per Dr. Coralyn Mark we will refill 1 month and check her TSH. We will follow up with her results.-eh

## 2013-10-05 NOTE — Telephone Encounter (Signed)
[name]  This needs to be filled by Dr. Loanne Drilling her endocrinologist.   Her last TSH was abnormal and she needs to call his office for refills

## 2013-10-10 LAB — TSH: TSH: 0.055 u[IU]/mL — ABNORMAL LOW (ref 0.350–4.500)

## 2013-10-10 LAB — T4, FREE: Free T4: 2.09 ng/dL — ABNORMAL HIGH (ref 0.80–1.80)

## 2013-10-10 LAB — T3, FREE: T3 FREE: 3.6 pg/mL (ref 2.3–4.2)

## 2013-10-11 ENCOUNTER — Telehealth: Payer: Self-pay | Admitting: Internal Medicine

## 2013-10-11 NOTE — Telephone Encounter (Signed)
Left message for pt to call office regarding lab results

## 2013-10-13 ENCOUNTER — Telehealth: Payer: Self-pay | Admitting: Internal Medicine

## 2013-10-13 MED ORDER — LEVOTHYROXINE SODIUM 100 MCG PO TABS: 100.0000 ug | ORAL_TABLET | Freq: Every day | ORAL | Status: DC

## 2013-10-13 NOTE — Telephone Encounter (Signed)
I spoke with [name] and let her know that we lowered her thyroid medication. [place] beach [place] and will call us next week to make a follow up appointment to recheck her thyroid in 8-10 weeks-eh

## 2013-10-13 NOTE — Telephone Encounter (Signed)
Call pt and let her know that her thyroid lab shows too much medication  Have her stop her 112 mcg dose and start synthroid 100 mcg  I e-scribed new Rx toher pharmacy  Give her an appt in 8-10 weeks to see me   Will recheck labs then

## 2013-11-30 ENCOUNTER — Encounter: Payer: Self-pay | Admitting: Internal Medicine

## 2013-11-30 ENCOUNTER — Ambulatory Visit (HOSPITAL_BASED_OUTPATIENT_CLINIC_OR_DEPARTMENT_OTHER)
Admission: RE | Admit: 2013-11-30 | Discharge: 2013-11-30 | Disposition: A | Discharge status: Home / Self Care | Payer: Medicare HMO | Source: Ambulatory Visit | Attending: Internal Medicine | Admitting: Internal Medicine

## 2013-11-30 ENCOUNTER — Ambulatory Visit (INDEPENDENT_AMBULATORY_CARE_PROVIDER_SITE_OTHER): Payer: Commercial Managed Care - HMO | Admitting: Internal Medicine

## 2013-11-30 VITALS — BP 129/70 | HR 84 | Temp 98.7°F | Resp 16 | Ht 63.0 in | Wt 178.0 lb

## 2013-11-30 DIAGNOSIS — M159 Polyosteoarthritis, unspecified: Secondary | ICD-10-CM

## 2013-11-30 DIAGNOSIS — M25551 Pain in right hip: Secondary | ICD-10-CM | POA: Insufficient documentation

## 2013-11-30 DIAGNOSIS — E038 Other specified hypothyroidism: Secondary | ICD-10-CM

## 2013-11-30 DIAGNOSIS — M150 Primary generalized (osteo)arthritis: Secondary | ICD-10-CM

## 2013-11-30 NOTE — Patient Instructions (Signed)
To xray and lab today   See me next week

## 2013-11-30 NOTE — Progress Notes (Signed)
[name] is aware of hip x-ray results-eh

## 2013-11-30 NOTE — Progress Notes (Signed)
Subjective:    [name], female    DOB: May 08, 1940, 73 y.o.   MRN: [id number]  HPI 04/2013  Shingles Improving  Post herpetic neuralgia improving Continue Tramadol prn  Schedule CPE  Today's note  [name] returns after we have changed her Synthroid to 100 mcg which she has been on since ealry September  She reports R hip pain and sometimes will radiate down back or her leg.  She spent the weekend moving her son and went up and down several steps.  Denies numbness  Or muscle weakness  She also notes she is gaining weight.  She has lost of dietary indiscretion with sweets and is not exercising  .  She has gained 7# since last December but has retired is craving sweets and not nearly as physically active   Allergies  Allergen Reactions  . Amitriptyline Other (See Comments)    Intolerance, hallucinations  . Demerol Other (See Comments)    migraines  . Demerol [Meperidine]   . Fish Allergy Nausea Only  . Nsaids     Lower GI bleeding  . Morphine And Related Rash   Past Medical History  Diagnosis Date  . Hypertension   . Hyperthyroidism     nodule on thyroid, Radioactive, no hypo  . Arthritis     knees  . Depression   . Diverticulosis     bleeding  . Hypothyroidism, iatrogenic     After RI now hypo on synthroid  . Diverticulitis   . GERD (gastroesophageal reflux disease)    Past Surgical History  Procedure Laterality Date  . Bladder surgery      bladder suspension  . Abdominal hysterectomy  1977  . Total knee revision  12/17/2010    Procedure: TOTAL KNEE REVISION;  Surgeon: Dione Plover Aluisio;  Location: [facility];  Service: Orthopedics;  Laterality: Right;  . Joint replacement Right 2009    total knee replacement  . Rotator cuff repair Left   . Cholecystectomy N/A 03/29/2013    Procedure: LAPAROSCOPIC CHOLECYSTECTOMY WITH INTRAOPERATIVE CHOLANGIOGRAM;  Surgeon: Edward Jolly, MD;  Location: [facility];  Service: General;  Laterality: N/A;   History    Social History  . Marital Status: Married    Spouse Name: N/A    Number of Children: N/A  . Years of Education: N/A   Occupational History  . Not on file.   Social History Main Topics  . Smoking status: Never Smoker   . Smokeless tobacco: Never Used  . Alcohol Use: No  . Drug Use: No  . Sexual Activity: Not Currently   Other Topics Concern  . Not on file   Social History Narrative  . No narrative on file   Family History  Problem Relation Age of Onset  . Diverticulitis Mother   . Colon cancer Maternal Grandmother   . Cancer Maternal Grandmother     colon  . Cancer Daughter     breast   Patient Active Problem List   Diagnosis Date Noted  . Post herpetic neuralgia 04/24/2013  . Shingles 04/24/2013  . RUQ abdominal pain 03/28/2013  . Renal calculi 03/27/2013  . Diverticulosis 03/27/2013  . Other postablative hypothyroidism 12/26/2012  . Breast density 12/12/2012  . S/P hysterectomy 11/30/2011  . Multinodular goiter 11/30/2011  . Myalgia 11/30/2011  . GERD (gastroesophageal reflux disease) 11/30/2011  . Insomnia 07/23/2011  . History of anemia 05/10/2011  . History of herpes simplex infection 05/10/2011  . Anxiety 05/04/2011  .  Essential hypertension, benign 05/04/2011  . Menopause 05/04/2011  . Osteoporosis 05/04/2011  . DJD (degenerative joint disease) 05/04/2011  . Diverticulitis of sigmoid colon 07/29/2010   Current Outpatient Prescriptions on File Prior to Visit  Medication Sig Dispense Refill  . acetaminophen-codeine (TYLENOL #3) 300-30 MG per tablet One tablet q4h prn pain 30 tablet 2  . benazepril-hydrochlorthiazide (LOTENSIN HCT) 20-12.5 MG per tablet Take 1 tablet by mouth daily.    Marland Kitchen levothyroxine (SYNTHROID, LEVOTHROID) 100 MCG tablet Take 1 tablet (100 mcg total) by mouth daily. 90 tablet 1  . lidocaine (LIDODERM) 5 % Place 1 patch onto the skin daily. Remove & Discard patch within 12 hours or as directed by MD 30 patch 0  . mometasone  (NASONEX) 50 MCG/ACT nasal spray Place 1 spray into the nose daily as needed (allergies).    Marland Kitchen OVER THE COUNTER MEDICATION Take 1 tablet by mouth daily. DIGESTIVE ADVANTAGE     . pantoprazole (PROTONIX) 40 MG tablet Take 1 tablet (40 mg total) by mouth daily. 30 tablet 3  . promethazine (PHENERGAN) 25 MG tablet Take 1 tablet (25 mg total) by mouth every 8 (eight) hours as needed for nausea or vomiting. 20 tablet 0  . Red Yeast Rice Extract (RED YEAST RICE PO) Take 1 tablet by mouth daily.     No current facility-administered medications on file prior to visit.       Review of Systems    see HPI Objective:   Physical Exam  Physical Exam  Nursing note and vitals reviewed.  Constitutional: She is oriented to person, place, and time. She appears well-developed and well-nourished.  HENT:  Head: Normocephalic and atraumatic.  Cardiovascular: Normal rate and regular rhythm. Exam reveals no gallop and no friction rub.  No murmur heard.  Pulmonary/Chest: Breath sounds normal. She has no wheezes. She has no rales.  Neurological: She is alert and oriented to person, place, and time.  Skin: Skin is warm and dry.  M/S R hip  No pain to I/E rotation, F/E or A.  SLR neg Feet good bilateral pp Psychiatric: She has a normal mood and affect. Her behavior is normal.        Assessment & Plan:  Post ablative hypothyroidism:  Will check TSH with free levels on 100 mcg daily   Hip pain  Will get xray.  Continue Tramadol.  Advised to see her orthopedic  WT gain  Given copy of 3 day diet and advised walking

## 2013-12-01 LAB — T4, FREE: Free T4: 1.65 ng/dL (ref 0.80–1.80)

## 2013-12-01 LAB — TSH: TSH: 0.768 u[IU]/mL (ref 0.350–4.500)

## 2013-12-01 LAB — T3, FREE: T3 FREE: 3.2 pg/mL (ref 2.3–4.2)

## 2013-12-04 NOTE — Progress Notes (Signed)
I spoke with [name] and gave her the lab results- eh

## 2013-12-13 ENCOUNTER — Ambulatory Visit (INDEPENDENT_AMBULATORY_CARE_PROVIDER_SITE_OTHER): Payer: Medicare HMO | Admitting: Internal Medicine

## 2013-12-13 ENCOUNTER — Encounter: Payer: Self-pay | Admitting: Internal Medicine

## 2013-12-13 VITALS — BP 128/69 | HR 92 | Temp 98.1°F | Resp 16 | Ht 62.25 in | Wt 171.0 lb

## 2013-12-13 DIAGNOSIS — Z0000 Encounter for general adult medical examination without abnormal findings: Secondary | ICD-10-CM

## 2013-12-13 DIAGNOSIS — Z0001 Encounter for general adult medical examination with abnormal findings: Secondary | ICD-10-CM

## 2013-12-13 DIAGNOSIS — E038 Other specified hypothyroidism: Secondary | ICD-10-CM

## 2013-12-13 DIAGNOSIS — Z23 Encounter for immunization: Secondary | ICD-10-CM

## 2013-12-13 DIAGNOSIS — I10 Essential (primary) hypertension: Secondary | ICD-10-CM

## 2013-12-13 DIAGNOSIS — S86002A Unspecified injury of left Achilles tendon, initial encounter: Secondary | ICD-10-CM

## 2013-12-13 DIAGNOSIS — E785 Hyperlipidemia, unspecified: Secondary | ICD-10-CM | POA: Insufficient documentation

## 2013-12-13 LAB — POCT URINALYSIS DIPSTICK
BILIRUBIN UA: NEGATIVE
Blood, UA: NEGATIVE
GLUCOSE UA: NEGATIVE
Ketones, UA: NEGATIVE
LEUKOCYTES UA: NEGATIVE
Nitrite, UA: NEGATIVE
PROTEIN UA: NEGATIVE
Spec Grav, UA: 1.01
Urobilinogen, UA: NEGATIVE
pH, UA: 6.5

## 2013-12-13 LAB — HEPATIC FUNCTION PANEL
ALBUMIN: 4.3 g/dL (ref 3.5–5.2)
ALT: 16 U/L (ref 0–35)
AST: 17 U/L (ref 0–37)
Alkaline Phosphatase: 92 U/L (ref 39–117)
BILIRUBIN TOTAL: 0.4 mg/dL (ref 0.2–1.2)
Bilirubin, Direct: 0.1 mg/dL (ref 0.0–0.3)
Indirect Bilirubin: 0.3 mg/dL (ref 0.2–1.2)
TOTAL PROTEIN: 7.3 g/dL (ref 6.0–8.3)

## 2013-12-13 LAB — HEMOCCULT GUIAC POC 1CARD (OFFICE): Fecal Occult Blood, POC: NEGATIVE

## 2013-12-13 LAB — LIPID PANEL
CHOL/HDL RATIO: 4.5 ratio
Cholesterol: 229 mg/dL — ABNORMAL HIGH (ref 0–200)
HDL: 51 mg/dL (ref >39)
LDL CALC: 130 mg/dL — AB (ref 0–99)
Triglycerides: 241 mg/dL — ABNORMAL HIGH (ref <150)
VLDL: 48 mg/dL — ABNORMAL HIGH (ref 0–40)

## 2013-12-13 LAB — COMPLETE METABOLIC PANEL WITH GFR
ALBUMIN: 4.3 g/dL (ref 3.5–5.2)
ALK PHOS: 92 U/L (ref 39–117)
ALT: 16 U/L (ref 0–35)
AST: 17 U/L (ref 0–37)
BILIRUBIN TOTAL: 0.4 mg/dL (ref 0.2–1.2)
BUN: 18 mg/dL (ref 6–23)
CO2: 29 mEq/L (ref 19–32)
Calcium: 10.4 mg/dL (ref 8.4–10.5)
Chloride: 101 mEq/L (ref 96–112)
Creat: 1.02 mg/dL (ref 0.50–1.10)
GFR, Est African American: 64 mL/min
GFR, Est Non African American: 55 mL/min — ABNORMAL LOW
Glucose, Bld: 89 mg/dL (ref 70–99)
POTASSIUM: 4.3 meq/L (ref 3.5–5.3)
Sodium: 141 mEq/L (ref 135–145)
Total Protein: 7.3 g/dL (ref 6.0–8.3)

## 2013-12-13 MED ORDER — ACETAMINOPHEN-CODEINE #3 300-30 MG PO TABS: ORAL_TABLET | ORAL | Status: DC

## 2013-12-13 MED ORDER — BENAZEPRIL-HYDROCHLOROTHIAZIDE 20-12.5 MG PO TABS: 1.0000 | ORAL_TABLET | Freq: Every day | ORAL | Status: DC

## 2013-12-13 MED ORDER — NYSTATIN-TRIAMCINOLONE 100000-0.1 UNIT/GM-% EX OINT: 1.0000 "application " | TOPICAL_OINTMENT | Freq: Two times a day (BID) | CUTANEOUS | Status: DC

## 2013-12-13 NOTE — Progress Notes (Signed)
Subjective:    [name], female    DOB: 1941-01-25, 73 y.o.   MRN: [id number]  HPI  [name] is here for CPE  HM:   Due for mm in December.  She is a non-smoker.  S/P Hsterectomy  Will need GI referral  ( she knew she was due for colonoscopy but Dr. Collene Mares not covered under her insurance)  Hyperlipidemia  Repeatedly declines  RX meds  FAther had very bad SE   Reports she stepped off a step and heard her left Achilles tendon "pop"  .  She is having trouble going up and down steps now  Allergies  Allergen Reactions  . Amitriptyline Other (See Comments)    Intolerance, hallucinations  . Demerol Other (See Comments)    migraines  . Demerol [Meperidine]   . Fish Allergy Nausea Only  . Nsaids     Lower GI bleeding  . Morphine And Related Rash   Past Medical History  Diagnosis Date  . Hypertension   . Hyperthyroidism     nodule on thyroid, Radioactive, no hypo  . Arthritis     knees  . Depression   . Diverticulosis     bleeding  . Hypothyroidism, iatrogenic     After RI now hypo on synthroid  . Diverticulitis   . GERD (gastroesophageal reflux disease)    Past Surgical History  Procedure Laterality Date  . Bladder surgery      bladder suspension  . Abdominal hysterectomy  1977  . Total knee revision  12/17/2010    Procedure: TOTAL KNEE REVISION;  Surgeon: Dione Plover Aluisio;  Location: [facility];  Service: Orthopedics;  Laterality: Right;  . Joint replacement Right 2009    total knee replacement  . Rotator cuff repair Left   . Cholecystectomy N/A 03/29/2013    Procedure: LAPAROSCOPIC CHOLECYSTECTOMY WITH INTRAOPERATIVE CHOLANGIOGRAM;  Surgeon: Edward Jolly, MD;  Location: [facility];  Service: General;  Laterality: N/A;   History   Social History  . Marital Status: Married    Spouse Name: N/A    Number of Children: N/A  . Years of Education: N/A   Occupational History  . Not on file.   Social History Main Topics  . Smoking status: Never Smoker   .  Smokeless tobacco: Never Used  . Alcohol Use: No  . Drug Use: No  . Sexual Activity: Not Currently   Other Topics Concern  . Not on file   Social History Narrative   Family History  Problem Relation Age of Onset  . Diverticulitis Mother   . Colon cancer Maternal Grandmother   . Cancer Maternal Grandmother     colon  . Cancer Daughter     breast   Patient Active Problem List   Diagnosis Date Noted  . Hyperlipidemia  pt repeatedly declines statins 12/13/2013  . Post herpetic neuralgia 04/24/2013  . Shingles 04/24/2013  . RUQ abdominal pain 03/28/2013  . Renal calculi 03/27/2013  . Diverticulosis 03/27/2013  . Other postablative hypothyroidism 12/26/2012  . Breast density 12/12/2012  . S/P hysterectomy 11/30/2011  . Multinodular goiter 11/30/2011  . Myalgia 11/30/2011  . GERD (gastroesophageal reflux disease) 11/30/2011  . Insomnia 07/23/2011  . History of anemia 05/10/2011  . History of herpes simplex infection 05/10/2011  . Anxiety 05/04/2011  . Essential hypertension, benign 05/04/2011  . Menopause 05/04/2011  . Osteoporosis 05/04/2011  . DJD (degenerative joint disease) 05/04/2011  . Diverticulitis of sigmoid colon 07/29/2010   Current  Outpatient Prescriptions on File Prior to Visit  Medication Sig Dispense Refill  . benazepril-hydrochlorthiazide (LOTENSIN HCT) 20-12.5 MG per tablet Take 1 tablet by mouth daily.    Marland Kitchen levothyroxine (SYNTHROID, LEVOTHROID) 100 MCG tablet Take 1 tablet (100 mcg total) by mouth daily. 90 tablet 1  . mometasone (NASONEX) 50 MCG/ACT nasal spray Place 1 spray into the nose daily as needed (allergies).    . promethazine (PHENERGAN) 25 MG tablet Take 1 tablet (25 mg total) by mouth every 8 (eight) hours as needed for nausea or vomiting. 20 tablet 0   No current facility-administered medications on file prior to visit.     Review of Systems    see HPI Objective:   Physical Exam Physical Exam  Nursing note and vitals reviewed.    Constitutional: She is oriented to person, place, and time. She appears well-developed and well-nourished.  HENT:  Head: Normocephalic and atraumatic.  Right Ear: Tympanic membrane and ear canal normal. No drainage. Tympanic membrane is not injected and not erythematous.  Left Ear: Tympanic membrane and ear canal normal. No drainage. Tympanic membrane is not injected and not erythematous.  Nose: Nose normal. Right sinus exhibits no maxillary sinus tenderness and no frontal sinus tenderness. Left sinus exhibits no maxillary sinus tenderness and no frontal sinus tenderness.  Mouth/Throat: Oropharynx is clear and moist. No oral lesions. No oropharyngeal exudate.  Eyes: Conjunctivae and EOM are normal. Pupils are equal, round, and reactive to light.  Neck: Normal range of motion. Neck supple. No JVD present. Carotid bruit is not present. No mass and no thyromegaly present.  Cardiovascular: Normal rate, regular rhythm, S1 normal, S2 normal and intact distal pulses. Exam reveals no gallop and no friction rub.  No murmur heard.  Pulses:  Carotid pulses are 2+ on the right side, and 2+ on the left side.  Dorsalis pedis pulses are 2+ on the right side, and 2+ on the left side.  No carotid bruit. No LE edema  Pulmonary/Chest: Breath sounds normal. She has no wheezes. She has no rales. She exhibits no tenderness.  Breast no discrete mass no nipple discharge no axillary adenopathy bilaterally  Abdominal: Soft. Bowel sounds are normal. She exhibits no distension and no mass. There is no hepatosplenomegaly. There is no tenderness. There is no CVA tenderness.   Rectal no mass guaiac neg.   Musculoskeletal: Normal range of motion.  Left achilles  Slight swelling  .   No active synovitis to joints.  Lymphadenopathy:  She has no cervical adenopathy.  She has no axillary adenopathy.  Right: No inguinal and no supraclavicular adenopathy present.  Left: No inguinal and no supraclavicular adenopathy present.   Neurological: She is alert and oriented to person, place, and time. She has normal strength and normal reflexes. She displays no tremor. No cranial nerve deficit or sensory deficit. Coordination and gait normal.  Skin: Skin is warm and dry. No rash noted. No cyanosis. Nails show no clubbing.   Red rash under each breast  Psychiatric: She has a normal mood and affect. Her speech is normal and behavior is normal. Cognition and memory are normal.            Assessment & Plan:  HM:  Advised 3D mm   Will set up Gi referral.  Counseled lung CA screening  Pt is a non-smoker  Will give Tdap today   HTN:  conitnue meds  Hypothyroidism  Last TSH normal  Continue current dosing   Hyperlipidemia:  Pt repeatedly  declines statins  I offered referral to lipid clinic but she declines now.  Willl recheck today   Achilles tendon injury on left. Will set up appt with  Dr. Orma Flaming breast  :   Mycolog creme bid    See me as needed

## 2013-12-14 ENCOUNTER — Encounter: Payer: Self-pay | Admitting: *Deleted

## 2013-12-14 ENCOUNTER — Telehealth: Payer: Self-pay

## 2013-12-14 ENCOUNTER — Other Ambulatory Visit: Payer: Self-pay | Admitting: Internal Medicine

## 2013-12-14 LAB — VITAMIN D 25 HYDROXY (VIT D DEFICIENCY, FRACTURES): VIT D 25 HYDROXY: 29 ng/mL — AB (ref 30–100)

## 2013-12-14 NOTE — Telephone Encounter (Signed)
FYI  I called [name] to talk with her about Gi referral to see who she had been to in the past and when her last Colonoscopy was, because I will need this info for scheduling at [facility]. After talking with her I discovered she had went to Dr Collene Mares and that her colonoscopy may have been done around 11 years ago and she also stated that Dr Collene Mares was not covered on her insurance anymore.  Then I called over to Dr Lorie Apley office in reference to this and found out that [name] had last been seen in 2009 for her Colonoscopy and I talked with her about Dr Collene Mares accepting [facility], she stated that they where not taking new patients with [name], but any of their existing patients they would still take and she was in network. So she is going to call and talk with [name] and let me know if she is going to see Dr Collene Mares and then I can do referral, if [name] is not going to see Dr Collene Mares then we will need copy of last Colonoscopy to send to [facility].

## 2013-12-15 NOTE — Progress Notes (Signed)
I spoke with [name] and gave her the lab results. She is going to get some Vitamin D3 OTC to take daily.-eh

## 2013-12-25 ENCOUNTER — Telehealth: Payer: Self-pay | Admitting: *Deleted

## 2013-12-25 NOTE — Telephone Encounter (Signed)
[name] called and asked if she could take 2 of her Tylenol #3 while she is passing a kidney stone, per Dr. Coralyn Mark she can take 2 tablets this one time. [name] is aware. eh

## 2014-01-10 ENCOUNTER — Ambulatory Visit (INDEPENDENT_AMBULATORY_CARE_PROVIDER_SITE_OTHER): Payer: Commercial Managed Care - HMO | Admitting: Internal Medicine

## 2014-01-10 ENCOUNTER — Encounter: Payer: Self-pay | Admitting: Internal Medicine

## 2014-01-10 VITALS — BP 138/81 | HR 83 | Ht 62.5 in | Wt 172.0 lb

## 2014-01-10 DIAGNOSIS — M791 Myalgia, unspecified site: Secondary | ICD-10-CM

## 2014-01-10 DIAGNOSIS — F32A Depression, unspecified: Secondary | ICD-10-CM

## 2014-01-10 DIAGNOSIS — F329 Major depressive disorder, single episode, unspecified: Secondary | ICD-10-CM

## 2014-01-10 MED ORDER — ESCITALOPRAM OXALATE 5 MG PO TABS: 5.0000 mg | ORAL_TABLET | Freq: Every day | ORAL | Status: DC

## 2014-01-10 NOTE — Patient Instructions (Signed)
See me in 8 weeks 30 mins

## 2014-01-10 NOTE — Progress Notes (Signed)
Subjective:    [name], female    DOB: 04/14/1940, 73 y.o.   MRN: [id number]  HPI  11/18 HM: Advised 3D mm Will set up Gi referral. Counseled lung CA screening Pt is a non-smoker Will give Tdap today   HTN: conitnue meds  Hypothyroidism Last TSH normal Continue current dosing   Hyperlipidemia: Pt repeatedly declines statins I offered referral to lipid clinic but she declines now. Willl recheck today   Achilles tendon injury on left. Will set up appt with Dr. Orma Flaming breast : Mycolog creme bid   See me as needed   TODAY:  [name] is here for acute visit   Last two months very teary  Sad mood  Slow motivation,  Chronic inability to sleep   Lexapro has helped in the past .  No S/H ideation/plan/intent  Pain in muscle above left scapula  Thinks she may have slept wrong   Allergies  Allergen Reactions  . Amitriptyline Other (See Comments)    Intolerance, hallucinations  . Demerol Other (See Comments)    migraines  . Demerol [Meperidine]   . Fish Allergy Nausea Only  . Nsaids     Lower GI bleeding  . Morphine And Related Rash   Past Medical History  Diagnosis Date  . Hypertension   . Hyperthyroidism     nodule on thyroid, Radioactive, no hypo  . Arthritis     knees  . Depression   . Diverticulosis     bleeding  . Hypothyroidism, iatrogenic     After RI now hypo on synthroid  . Diverticulitis   . GERD (gastroesophageal reflux disease)    Past Surgical History  Procedure Laterality Date  . Bladder surgery      bladder suspension  . Abdominal hysterectomy  1977  . Total knee revision  12/17/2010    Procedure: TOTAL KNEE REVISION;  Surgeon: Dione Plover Aluisio;  Location: [facility];  Service: Orthopedics;  Laterality: Right;  . Joint replacement Right 2009    total knee replacement  . Rotator cuff repair Left   . Cholecystectomy N/A 03/29/2013    Procedure: LAPAROSCOPIC CHOLECYSTECTOMY WITH INTRAOPERATIVE CHOLANGIOGRAM;   Surgeon: Edward Jolly, MD;  Location: [facility];  Service: General;  Laterality: N/A;   History   Social History  . Marital Status: Married    Spouse Name: N/A    Number of Children: N/A  . Years of Education: N/A   Occupational History  . Not on file.   Social History Main Topics  . Smoking status: Never Smoker   . Smokeless tobacco: Never Used  . Alcohol Use: No  . Drug Use: No  . Sexual Activity: Not Currently   Other Topics Concern  . Not on file   Social History Narrative   Family History  Problem Relation Age of Onset  . Diverticulitis Mother   . Colon cancer Maternal Grandmother   . Cancer Maternal Grandmother     colon  . Cancer Daughter     breast   Patient Active Problem List   Diagnosis Date Noted  . Hyperlipidemia  pt repeatedly declines statins 12/13/2013  . Post herpetic neuralgia 04/24/2013  . Shingles 04/24/2013  . RUQ abdominal pain 03/28/2013  . Renal calculi 03/27/2013  . Diverticulosis 03/27/2013  . Other postablative hypothyroidism 12/26/2012  . Breast density 12/12/2012  . S/P hysterectomy 11/30/2011  . Multinodular goiter 11/30/2011  . Myalgia 11/30/2011  . GERD (gastroesophageal reflux disease) 11/30/2011  .  Insomnia 07/23/2011  . History of anemia 05/10/2011  . History of herpes simplex infection 05/10/2011  . Anxiety 05/04/2011  . Essential hypertension, benign 05/04/2011  . Menopause 05/04/2011  . Osteoporosis 05/04/2011  . DJD (degenerative joint disease) 05/04/2011  . Diverticulitis of sigmoid colon 07/29/2010   Current Outpatient Prescriptions on File Prior to Visit  Medication Sig Dispense Refill  . acetaminophen-codeine (TYLENOL #3) 300-30 MG per tablet One tablet q4h prn pain 30 tablet 3  . benazepril-hydrochlorthiazide (LOTENSIN HCT) 20-12.5 MG per tablet Take 1 tablet by mouth daily. 90 tablet 2  . levothyroxine (SYNTHROID, LEVOTHROID) 100 MCG tablet Take 1 tablet (100 mcg total) by mouth daily. 90 tablet 1  .  mometasone (NASONEX) 50 MCG/ACT nasal spray Place 1 spray into the nose daily as needed (allergies).    . nystatin-triamcinolone ointment (MYCOLOG) Apply 1 application topically 2 (two) times daily. 30 g 0  . promethazine (PHENERGAN) 25 MG tablet Take 1 tablet (25 mg total) by mouth every 8 (eight) hours as needed for nausea or vomiting. 20 tablet 0   No current facility-administered medications on file prior to visit.      Review of Systems   See HPI Objective:   Physical Exam Physical Exam  Nursing note and vitals reviewed.  Teary in office Constitutional: She is oriented to person, place, and time. She appears well-developed and well-nourished.  HENT:  Head: Normocephalic and atraumatic.  Cardiovascular: Normal rate and regular rhythm. Exam reveals no gallop and no friction rub.  No murmur heard.  Pulmonary/Chest: Breath sounds normal. She has no wheezes. She has no rales.  Neurological: She is alert and oriented to person, place, and time.  Skin: Skin is warm and dry.  Psychiatric: She has a normal mood and affect. Her behavior is normal.         Assessment & Plan:  DEpression   Will give Lexapro 5 mg see me 8 weeks  Call if worsening mood  Muscular pain  Trapezius  Ok for gently massage

## 2014-01-17 ENCOUNTER — Encounter: Payer: Self-pay | Admitting: *Deleted

## 2014-02-22 ENCOUNTER — Telehealth: Payer: Self-pay | Admitting: Internal Medicine

## 2014-02-22 NOTE — Telephone Encounter (Signed)
-----   Message from Tarry Kos sent at 02/22/2014  2:16 PM EST ----- Contact: [phone] Neftaly thinks she may have a sinus infection and wanted to know if you would call something in for her.  I told I was not sure, but we would get back to her with your recommendations.

## 2014-02-22 NOTE — Telephone Encounter (Signed)
[name]  Call pt and let her know that I do not prescribe antibiotics without being seen

## 2014-02-25 NOTE — Progress Notes (Signed)
Subjective:    [name], female    DOB: 11/13/1940, 74 y.o.   MRN: [id number]  HPI 12/2013 note DEpression Will give Lexapro 5 mg see me 8 weeks Call if worsening mood  Muscular pain Trapezius Ok for gently massage   TODAY:     [name] is here for acute visit  Has 7-10 days of yellow sinus drainage.     No fever  .  Has pain in left cheek area  Depression:  Lexapro helping mood  .  No S/H ideation.    Difficult week as she has learned that her sister in law just diagnosed with Cancer.     Allergies  Allergen Reactions  . Amitriptyline Other (See Comments)    Intolerance, hallucinations  . Demerol Other (See Comments)    migraines  . Demerol [Meperidine]   . Fish Allergy Nausea Only  . Nsaids     Lower GI bleeding  . Morphine And Related Rash   Past Medical History  Diagnosis Date  . Hypertension   . Hyperthyroidism     nodule on thyroid, Radioactive, no hypo  . Arthritis     knees  . Depression   . Diverticulosis     bleeding  . Hypothyroidism, iatrogenic     After RI now hypo on synthroid  . Diverticulitis   . GERD (gastroesophageal reflux disease)    Past Surgical History  Procedure Laterality Date  . Bladder surgery      bladder suspension  . Abdominal hysterectomy  1977  . Total knee revision  12/17/2010    Procedure: TOTAL KNEE REVISION;  Surgeon: Dione Plover Aluisio;  Location: [facility];  Service: Orthopedics;  Laterality: Right;  . Joint replacement Right 2009    total knee replacement  . Rotator cuff repair Left   . Cholecystectomy N/A 03/29/2013    Procedure: LAPAROSCOPIC CHOLECYSTECTOMY WITH INTRAOPERATIVE CHOLANGIOGRAM;  Surgeon: Edward Jolly, MD;  Location: [facility];  Service: General;  Laterality: N/A;   History   Social History  . Marital Status: Married    Spouse Name: N/A    Number of Children: N/A  . Years of Education: N/A   Occupational History  . Not on file.   Social History Main Topics  . Smoking status:  Never Smoker   . Smokeless tobacco: Never Used  . Alcohol Use: No  . Drug Use: No  . Sexual Activity: Not Currently    Birth Control/ Protection: Surgical     Comment: Hysterectomy   Other Topics Concern  . Not on file   Social History Narrative   Family History  Problem Relation Age of Onset  . Diverticulitis Mother   . Colon cancer Maternal Grandmother   . Cancer Maternal Grandmother     colon  . Cancer Daughter     breast   Patient Active Problem List   Diagnosis Date Noted  . Depression 01/10/2014  . Hyperlipidemia  pt repeatedly declines statins 12/13/2013  . Post herpetic neuralgia 04/24/2013  . Shingles 04/24/2013  . RUQ abdominal pain 03/28/2013  . Renal calculi 03/27/2013  . Diverticulosis 03/27/2013  . Other postablative hypothyroidism 12/26/2012  . Breast density 12/12/2012  . S/P hysterectomy 11/30/2011  . Multinodular goiter 11/30/2011  . Myalgia 11/30/2011  . GERD (gastroesophageal reflux disease) 11/30/2011  . Insomnia 07/23/2011  . History of anemia 05/10/2011  . History of herpes simplex infection 05/10/2011  . Anxiety 05/04/2011  . Essential hypertension, benign 05/04/2011  .  Menopause 05/04/2011  . Osteoporosis 05/04/2011  . DJD (degenerative joint disease) 05/04/2011  . Diverticulitis of sigmoid colon 07/29/2010   Current Outpatient Prescriptions on File Prior to Visit  Medication Sig Dispense Refill  . acetaminophen-codeine (TYLENOL #3) 300-30 MG per tablet One tablet q4h prn pain 30 tablet 3  . benazepril-hydrochlorthiazide (LOTENSIN HCT) 20-12.5 MG per tablet Take 1 tablet by mouth daily. 90 tablet 2  . escitalopram (LEXAPRO) 5 MG tablet Take 1 tablet (5 mg total) by mouth daily. 30 tablet 2  . levothyroxine (SYNTHROID, LEVOTHROID) 100 MCG tablet Take 1 tablet (100 mcg total) by mouth daily. 90 tablet 1  . mometasone (NASONEX) 50 MCG/ACT nasal spray Place 1 spray into the nose daily as needed (allergies).    . promethazine (PHENERGAN) 25 MG  tablet Take 1 tablet (25 mg total) by mouth every 8 (eight) hours as needed for nausea or vomiting. 20 tablet 0   No current facility-administered medications on file prior to visit.       Review of Systems    see HPI Objective:   Physical Exam Physical Exam  Nursing note and vitals reviewed.  Constitutional: She is oriented to person, place, and time. She appears well-developed and well-nourished.  HENT:  Head: Normocephalic and atraumatic.  Cardiovascular: Normal rate and regular rhythm. Exam reveals no gallop and no friction rub.  No murmur heard.  Pulmonary/Chest: Breath sounds normal. She has no wheezes. She has no rales.  Neurological: She is alert and oriented to person, place, and time.  Skin: Skin is warm and dry.  Psychiatric: She has a normal mood and affect. Her behavior is normal.         Assessment & Plan:  Sinusitis  Will give Z-pak    Depression:  Continue Lexapro

## 2014-02-26 ENCOUNTER — Ambulatory Visit (INDEPENDENT_AMBULATORY_CARE_PROVIDER_SITE_OTHER): Payer: Commercial Managed Care - HMO | Admitting: Internal Medicine

## 2014-02-26 ENCOUNTER — Encounter: Payer: Self-pay | Admitting: Internal Medicine

## 2014-02-26 VITALS — BP 147/72 | HR 94 | Resp 16 | Ht 62.5 in | Wt 171.0 lb

## 2014-02-26 DIAGNOSIS — F329 Major depressive disorder, single episode, unspecified: Secondary | ICD-10-CM

## 2014-02-26 DIAGNOSIS — J0100 Acute maxillary sinusitis, unspecified: Secondary | ICD-10-CM

## 2014-02-26 DIAGNOSIS — F32A Depression, unspecified: Secondary | ICD-10-CM

## 2014-02-26 MED ORDER — AZITHROMYCIN 250 MG PO TABS: ORAL_TABLET | ORAL | Status: DC

## 2014-03-13 ENCOUNTER — Encounter: Payer: Self-pay | Admitting: *Deleted

## 2014-03-13 ENCOUNTER — Other Ambulatory Visit: Payer: Self-pay | Admitting: Internal Medicine

## 2014-03-13 DIAGNOSIS — Z1231 Encounter for screening mammogram for malignant neoplasm of breast: Secondary | ICD-10-CM

## 2014-03-19 ENCOUNTER — Encounter: Payer: Self-pay | Admitting: Internal Medicine

## 2014-03-19 ENCOUNTER — Ambulatory Visit (INDEPENDENT_AMBULATORY_CARE_PROVIDER_SITE_OTHER): Payer: Commercial Managed Care - HMO | Admitting: Internal Medicine

## 2014-03-19 VITALS — BP 122/73 | HR 103 | Resp 16 | Ht 62.5 in | Wt 163.0 lb

## 2014-03-19 DIAGNOSIS — F32A Depression, unspecified: Secondary | ICD-10-CM

## 2014-03-19 DIAGNOSIS — F329 Major depressive disorder, single episode, unspecified: Secondary | ICD-10-CM

## 2014-03-19 DIAGNOSIS — R634 Abnormal weight loss: Secondary | ICD-10-CM

## 2014-03-19 MED ORDER — ESCITALOPRAM OXALATE 5 MG PO TABS: 5.0000 mg | ORAL_TABLET | Freq: Every day | ORAL | Status: DC

## 2014-03-19 NOTE — Progress Notes (Signed)
Subjective:    [name], female    DOB: 02-07-1940, 74 y.o.   MRN: [id number]  HPI 02/26/2014 Assessment & Plan:  Sinusitis Will give Z-pak   Depression: Continue Lexapro       Today   [name] is here to follow up after initiation of Lexapro  She reports mood improved,  Feels  "60%  Better"    No S/H ideation/plan/intent  Following Richardson Landry the food guy diet and has lost 8 lbs.  She is very happy about this  Allergies  Allergen Reactions  . Amitriptyline Other (See Comments)    Intolerance, hallucinations  . Demerol Other (See Comments)    migraines  . Demerol [Meperidine]   . Fish Allergy Nausea Only  . Nsaids     Lower GI bleeding  . Morphine And Related Rash   Past Medical History  Diagnosis Date  . Hypertension   . Hyperthyroidism     nodule on thyroid, Radioactive, no hypo  . Arthritis     knees  . Depression   . Diverticulosis     bleeding  . Hypothyroidism, iatrogenic     After RI now hypo on synthroid  . Diverticulitis   . GERD (gastroesophageal reflux disease)    Past Surgical History  Procedure Laterality Date  . Bladder surgery      bladder suspension  . Abdominal hysterectomy  1977  . Total knee revision  12/17/2010    Procedure: TOTAL KNEE REVISION;  Surgeon: Dione Plover Aluisio;  Location: [facility];  Service: Orthopedics;  Laterality: Right;  . Joint replacement Right 2009    total knee replacement  . Rotator cuff repair Left   . Cholecystectomy N/A 03/29/2013    Procedure: LAPAROSCOPIC CHOLECYSTECTOMY WITH INTRAOPERATIVE CHOLANGIOGRAM;  Surgeon: Edward Jolly, MD;  Location: [facility];  Service: General;  Laterality: N/A;   History   Social History  . Marital Status: Married    Spouse Name: N/A  . Number of Children: N/A  . Years of Education: N/A   Occupational History  . Not on file.   Social History Main Topics  . Smoking status: Never Smoker   . Smokeless tobacco: Never Used  . Alcohol Use: No  . Drug Use: No    . Sexual Activity: Not Currently    Birth Control/ Protection: Surgical     Comment: Hysterectomy   Other Topics Concern  . Not on file   Social History Narrative   Family History  Problem Relation Age of Onset  . Diverticulitis Mother   . Colon cancer Maternal Grandmother   . Cancer Maternal Grandmother     colon  . Cancer Daughter     breast   Patient Active Problem List   Diagnosis Date Noted  . Depression 01/10/2014  . Hyperlipidemia  pt repeatedly declines statins 12/13/2013  . Post herpetic neuralgia 04/24/2013  . Shingles 04/24/2013  . RUQ abdominal pain 03/28/2013  . Renal calculi 03/27/2013  . Diverticulosis 03/27/2013  . Other postablative hypothyroidism 12/26/2012  . Breast density 12/12/2012  . S/P hysterectomy 11/30/2011  . Multinodular goiter 11/30/2011  . Myalgia 11/30/2011  . GERD (gastroesophageal reflux disease) 11/30/2011  . Insomnia 07/23/2011  . History of anemia 05/10/2011  . History of herpes simplex infection 05/10/2011  . Anxiety 05/04/2011  . Essential hypertension, benign 05/04/2011  . Menopause 05/04/2011  . Osteoporosis 05/04/2011  . DJD (degenerative joint disease) 05/04/2011  . Diverticulitis of sigmoid colon 07/29/2010   Current Outpatient  Prescriptions on File Prior to Visit  Medication Sig Dispense Refill  . acetaminophen-codeine (TYLENOL #3) 300-30 MG per tablet One tablet q4h prn pain 30 tablet 3  . azithromycin (ZITHROMAX) 250 MG tablet Take as directed 6 tablet 0  . benazepril-hydrochlorthiazide (LOTENSIN HCT) 20-12.5 MG per tablet Take 1 tablet by mouth daily. 90 tablet 2  . Cholecalciferol (VITAMIN D3) 5000 UNITS CAPS Take by mouth.    . escitalopram (LEXAPRO) 5 MG tablet Take 1 tablet (5 mg total) by mouth daily. 30 tablet 2  . levothyroxine (SYNTHROID, LEVOTHROID) 100 MCG tablet Take 1 tablet (100 mcg total) by mouth daily. 90 tablet 1  . Melatonin 5 MG CAPS     . mometasone (NASONEX) 50 MCG/ACT nasal spray Place 1 spray  into the nose daily as needed (allergies).    . promethazine (PHENERGAN) 25 MG tablet Take 1 tablet (25 mg total) by mouth every 8 (eight) hours as needed for nausea or vomiting. 20 tablet 0  . vitamin B-12 (CYANOCOBALAMIN) 100 MCG tablet      No current facility-administered medications on file prior to visit.       Review of Systems    see HPI Objective:   Physical Exam  Physical Exam  Nursing note and vitals reviewed.  Constitutional: She is oriented to person, place, and time. She appears well-developed and well-nourished.  HENT:  Head: Normocephalic and atraumatic.  Cardiovascular: Normal rate and regular rhythm. Exam reveals no gallop and no friction rub.  No murmur heard.  Pulmonary/Chest: Breath sounds normal. She has no wheezes. She has no rales.  Neurological: She is alert and oriented to person, place, and time.  Skin: Skin is warm and dry.  Psychiatric: She has a normal mood and affect. Her behavior is normal.        Assessment & Plan:  Depression  Will continue Lexapro.   Intentional weight loss   Continue

## 2014-03-21 ENCOUNTER — Ambulatory Visit (HOSPITAL_COMMUNITY)
Admission: RE | Admit: 2014-03-21 | Discharge: 2014-03-21 | Disposition: A | Discharge status: Home / Self Care | Payer: Commercial Managed Care - HMO | Source: Ambulatory Visit | Attending: Internal Medicine | Admitting: Internal Medicine

## 2014-03-21 DIAGNOSIS — Z1231 Encounter for screening mammogram for malignant neoplasm of breast: Secondary | ICD-10-CM | POA: Diagnosis present

## 2014-04-04 ENCOUNTER — Other Ambulatory Visit: Payer: Self-pay | Admitting: Internal Medicine

## 2014-04-11 ENCOUNTER — Other Ambulatory Visit: Payer: Self-pay | Admitting: *Deleted

## 2014-04-11 MED ORDER — ACETAMINOPHEN-CODEINE #3 300-30 MG PO TABS: ORAL_TABLET | ORAL | Status: DC

## 2014-04-11 NOTE — Telephone Encounter (Signed)
RX called in .

## 2014-04-11 NOTE — Telephone Encounter (Signed)
Refill request

## 2014-04-13 ENCOUNTER — Other Ambulatory Visit: Payer: Self-pay | Admitting: Internal Medicine

## 2014-04-16 NOTE — Telephone Encounter (Signed)
Refill request

## 2014-05-02 ENCOUNTER — Telehealth: Payer: Self-pay | Admitting: *Deleted

## 2014-05-02 NOTE — Telephone Encounter (Signed)
Pt called in wanting referral to Dr Delman Cheadle dermatology - I called Delman Cheadle but pt does not need referral and appt made 05/03/14 at 9:50am

## 2014-05-02 NOTE — Telephone Encounter (Signed)
error 

## 2014-05-03 ENCOUNTER — Other Ambulatory Visit: Payer: Self-pay | Admitting: Dermatology

## 2014-05-09 ENCOUNTER — Encounter: Payer: Self-pay | Admitting: *Deleted

## 2014-05-09 ENCOUNTER — Telehealth: Payer: Self-pay | Admitting: *Deleted

## 2014-05-09 DIAGNOSIS — M25519 Pain in unspecified shoulder: Secondary | ICD-10-CM

## 2014-05-09 NOTE — Telephone Encounter (Signed)
Pt called in wanting referral to [facility] for shoulder pain.

## 2014-05-09 NOTE — Telephone Encounter (Signed)
Ok to refer Please set this up for her

## 2014-05-09 NOTE — Telephone Encounter (Signed)
Sent referral to [facility]

## 2014-11-13 ENCOUNTER — Other Ambulatory Visit: Payer: Self-pay | Admitting: Nephrology

## 2014-11-13 DIAGNOSIS — I129 Hypertensive chronic kidney disease with stage 1 through stage 4 chronic kidney disease, or unspecified chronic kidney disease: Secondary | ICD-10-CM

## 2014-11-19 ENCOUNTER — Ambulatory Visit
Admission: RE | Admit: 2014-11-19 | Discharge: 2014-11-19 | Disposition: A | Discharge status: Home / Self Care | Payer: Medicare HMO | Source: Ambulatory Visit | Attending: Nephrology | Admitting: Nephrology

## 2014-11-19 DIAGNOSIS — I129 Hypertensive chronic kidney disease with stage 1 through stage 4 chronic kidney disease, or unspecified chronic kidney disease: Secondary | ICD-10-CM

## 2015-01-10 ENCOUNTER — Encounter (HOSPITAL_BASED_OUTPATIENT_CLINIC_OR_DEPARTMENT_OTHER): Payer: Self-pay | Admitting: *Deleted

## 2015-01-10 ENCOUNTER — Emergency Department (HOSPITAL_BASED_OUTPATIENT_CLINIC_OR_DEPARTMENT_OTHER)
Admission: EM | Admit: 2015-01-10 | Discharge: 2015-01-10 | Disposition: A | Discharge status: Home / Self Care | Payer: Medicare HMO | Attending: Emergency Medicine | Admitting: Emergency Medicine

## 2015-01-10 DIAGNOSIS — Z79899 Other long term (current) drug therapy: Secondary | ICD-10-CM | POA: Insufficient documentation

## 2015-01-10 DIAGNOSIS — I10 Essential (primary) hypertension: Secondary | ICD-10-CM | POA: Insufficient documentation

## 2015-01-10 DIAGNOSIS — M170 Bilateral primary osteoarthritis of knee: Secondary | ICD-10-CM | POA: Insufficient documentation

## 2015-01-10 DIAGNOSIS — M5432 Sciatica, left side: Secondary | ICD-10-CM | POA: Diagnosis not present

## 2015-01-10 DIAGNOSIS — Z8719 Personal history of other diseases of the digestive system: Secondary | ICD-10-CM | POA: Diagnosis not present

## 2015-01-10 DIAGNOSIS — F329 Major depressive disorder, single episode, unspecified: Secondary | ICD-10-CM | POA: Diagnosis not present

## 2015-01-10 DIAGNOSIS — E049 Nontoxic goiter, unspecified: Secondary | ICD-10-CM | POA: Diagnosis not present

## 2015-01-10 DIAGNOSIS — M545 Low back pain: Secondary | ICD-10-CM | POA: Diagnosis present

## 2015-01-10 MED ORDER — PREDNISONE 10 MG (21) PO TBPK: 10.0000 mg | ORAL_TABLET | Freq: Every day | ORAL | Status: DC

## 2015-01-10 MED ORDER — DEXAMETHASONE SODIUM PHOSPHATE 10 MG/ML IJ SOLN
10.0000 mg | Freq: Once | INTRAMUSCULAR | Status: AC
  Administered 2015-01-10: 10 mg via INTRAMUSCULAR
  Filled 2015-01-10: qty 1

## 2015-01-10 MED ORDER — HYDROCODONE-ACETAMINOPHEN 5-325 MG PO TABS: 2.0000 | ORAL_TABLET | ORAL | Status: DC | PRN

## 2015-01-10 MED ORDER — KETOROLAC TROMETHAMINE 60 MG/2ML IM SOLN
60.0000 mg | Freq: Once | INTRAMUSCULAR | Status: AC
  Administered 2015-01-10: 60 mg via INTRAMUSCULAR
  Filled 2015-01-10: qty 2

## 2015-01-10 NOTE — Discharge Instructions (Signed)
Ms. [name],  Nice meeting you! Please follow-up with your primary care provider, or orthopedics if this does not improve in two weeks. Return to the emergency department if you develop fevers, chills, inability to walk, increasing pain, loss of bladder/bowel function. Feel better soon!  S. Wendie Simmer, PA-C

## 2015-01-10 NOTE — ED Notes (Signed)
Lower back pain x 1 week. Radiation into her left hip. States she feels like she has a pinched nerve.

## 2015-01-13 NOTE — ED Provider Notes (Signed)
CSN: [id number]     Arrival date & time 01/10/15  1416 History   First MD Initiated Contact with Patient 01/10/15 1428     Chief Complaint  Patient presents with  . Back Pain   HPI [name] is a 74 y.o. F PMH significant for HTN, arthritis presenting with a one week history of lower back pain. She describes her pain as 5/10 pain scale, worsening with sitting, intermittent, sharp, left sided in location and radiating down the back of her left leg. She denies any fevers, chills, injury, history of this before, loss of bowel/bladder control, changes in weight.   Past Medical History  Diagnosis Date  . Hypertension   . Hyperthyroidism     nodule on thyroid, Radioactive, no hypo  . Arthritis     knees  . Depression   . Diverticulosis     bleeding  . Hypothyroidism, iatrogenic     After RI now hypo on synthroid  . Diverticulitis   . GERD (gastroesophageal reflux disease)    Past Surgical History  Procedure Laterality Date  . Bladder surgery      bladder suspension  . Abdominal hysterectomy  1977  . Total knee revision  12/17/2010    Procedure: TOTAL KNEE REVISION;  Surgeon: Dione Plover Aluisio;  Location: [facility];  Service: Orthopedics;  Laterality: Right;  . Joint replacement Right 2009    total knee replacement  . Rotator cuff repair Left   . Cholecystectomy N/A 03/29/2013    Procedure: LAPAROSCOPIC CHOLECYSTECTOMY WITH INTRAOPERATIVE CHOLANGIOGRAM;  Surgeon: Edward Jolly, MD;  Location: [facility];  Service: General;  Laterality: N/A;   Family History  Problem Relation Age of Onset  . Diverticulitis Mother   . Colon cancer Maternal Grandmother   . Cancer Maternal Grandmother     colon  . Cancer Daughter     breast   Social History  Substance Use Topics  . Smoking status: Never Smoker   . Smokeless tobacco: Never Used  . Alcohol Use: No   OB History    No data available     Review of Systems  Ten systems are reviewed and are negative for acute change except as  noted in the HPI  Allergies  Amitriptyline; Demerol; Demerol; Fish allergy; Nsaids; and Morphine and related  Home Medications   Prior to Admission medications   Medication Sig Start Date End Date Taking? Authorizing Provider  acetaminophen-codeine (TYLENOL #3) 300-30 MG per tablet One tablet q4h prn pain 04/11/14   Lanice Shirts, MD  benazepril-hydrochlorthiazide (LOTENSIN HCT) 20-12.5 MG per tablet Take 1 tablet by mouth daily. 12/13/13   Lanice Shirts, MD  Cholecalciferol (VITAMIN D3) 5000 UNITS CAPS Take by mouth.    Historical Provider, MD  escitalopram (LEXAPRO) 5 MG tablet Take 1 tablet (5 mg total) by mouth daily. 03/19/14   Lanice Shirts, MD  HYDROcodone-acetaminophen (NORCO/VICODIN) 5-325 MG tablet Take 2 tablets by mouth every 4 (four) hours as needed. 01/10/15   Montcalm Lions, PA-C  levothyroxine (SYNTHROID, LEVOTHROID) 100 MCG tablet TAKE 1 TABLET (100 MCG TOTAL) BY MOUTH DAILY. 04/16/14   Lanice Shirts, MD  Melatonin 5 MG CAPS  12/13/13   Historical Provider, MD  mometasone (NASONEX) 50 MCG/ACT nasal spray Place 1 spray into the nose daily as needed (allergies). 07/23/11   Lanice Shirts, MD  predniSONE (STERAPRED UNI-PAK 21 TAB) 10 MG (21) TBPK tablet Take 1 tablet (10 mg total) by mouth daily. Take 6  tabs by mouth daily  for 2 days, then 5 tabs for 2 days, then 4 tabs for 2 days, then 3 tabs for 2 days, 2 tabs for 2 days, then 1 tab by mouth daily for 2 days 01/10/15   Bayard Lions, PA-C  promethazine (PHENERGAN) 25 MG tablet Take 1 tablet (25 mg total) by mouth every 8 (eight) hours as needed for nausea or vomiting. 01/23/13   Lanice Shirts, MD  vitamin B-12 (CYANOCOBALAMIN) 100 MCG tablet  12/13/13   Historical Provider, MD   BP 144/84 mmHg  Pulse 85  Temp(Src) 98 F (36.7 C) (Oral)  Resp 18  Ht 5' 2.5" (1.588 m)  Wt 70.761 kg  BMI 28.06 kg/m2  SpO2 100% Physical Exam  Constitutional: She appears well-developed and  well-nourished. No distress.  HENT:  Head: Normocephalic and atraumatic.  Mouth/Throat: Oropharynx is clear and moist. No oropharyngeal exudate.  Eyes: Conjunctivae are normal. Pupils are equal, round, and reactive to light. Right eye exhibits no discharge. Left eye exhibits no discharge. No scleral icterus.  Neck: No tracheal deviation present.  Cardiovascular: Normal rate, regular rhythm, normal heart sounds and intact distal pulses.  Exam reveals no gallop and no friction rub.   No murmur heard. Pulmonary/Chest: Effort normal and breath sounds normal. No respiratory distress. She has no wheezes. She has no rales. She exhibits no tenderness.  Abdominal: Soft. Bowel sounds are normal. She exhibits no distension and no mass. There is no tenderness. There is no rebound and no guarding.  Musculoskeletal: She exhibits tenderness. She exhibits no edema.  Tenderness over left SI joint  Lymphadenopathy:    She has no cervical adenopathy.  Neurological: She is alert. Coordination normal.  Skin: Skin is warm and dry. No rash noted. She is not diaphoretic. No erythema.  Psychiatric: She has a normal mood and affect. Her behavior is normal.  Nursing note and vitals reviewed.   ED Course  Procedures   MDM   Final diagnoses:  Sciatica of left side   Patient non-toxic appearing and VSS. Based on patient history and physical exam, most likely etiologies include sciatica vs strain. Less likely etiologies include ruptured AAA, pancreatitis, cholecystitis, ulcer, vertebral compression fracture, retroperitoneal bleed, spinal epidural abscess, discitis, osteomyelitis, pyelonephritis/perinephric abscess, cauda equina syndrome, herniated disc, spinal stenosis, fibromyalgia, rheumatoid arthritis, spondylitis, osteoarthritis, nephrolithiasis, multiple myeloma, bony metastasis. Although imaging indicated given patient age, there is no midline tenderness, and sciatica is most likely.   Upon reevaluation,  patient improved after toradol and decadron. Patient may be safely discharged home. Discussed reasons for return. Patient to follow-up with primary care provider or orthopedics within two weeks if symptoms do not improve. Patient in understanding and agreement with the plan.   Gonzalez Lions, [place] 01/13/15 3536  Sherwood Gambler, MD 01/18/15 1357

## 2015-02-13 DIAGNOSIS — N2581 Secondary hyperparathyroidism of renal origin: Secondary | ICD-10-CM | POA: Diagnosis not present

## 2015-02-13 DIAGNOSIS — D631 Anemia in chronic kidney disease: Secondary | ICD-10-CM | POA: Diagnosis not present

## 2015-02-13 DIAGNOSIS — I129 Hypertensive chronic kidney disease with stage 1 through stage 4 chronic kidney disease, or unspecified chronic kidney disease: Secondary | ICD-10-CM | POA: Diagnosis not present

## 2015-02-14 DIAGNOSIS — M9904 Segmental and somatic dysfunction of sacral region: Secondary | ICD-10-CM | POA: Diagnosis not present

## 2015-02-14 DIAGNOSIS — M5137 Other intervertebral disc degeneration, lumbosacral region: Secondary | ICD-10-CM | POA: Diagnosis not present

## 2015-02-14 DIAGNOSIS — M9903 Segmental and somatic dysfunction of lumbar region: Secondary | ICD-10-CM | POA: Diagnosis not present

## 2015-02-14 DIAGNOSIS — M461 Sacroiliitis, not elsewhere classified: Secondary | ICD-10-CM | POA: Diagnosis not present

## 2015-02-14 DIAGNOSIS — M9905 Segmental and somatic dysfunction of pelvic region: Secondary | ICD-10-CM | POA: Diagnosis not present

## 2015-02-15 ENCOUNTER — Emergency Department (HOSPITAL_BASED_OUTPATIENT_CLINIC_OR_DEPARTMENT_OTHER)
Admission: EM | Admit: 2015-02-15 | Discharge: 2015-02-15 | Disposition: A | Discharge status: Home / Self Care | Payer: PPO | Attending: Emergency Medicine | Admitting: Emergency Medicine

## 2015-02-15 ENCOUNTER — Encounter (HOSPITAL_BASED_OUTPATIENT_CLINIC_OR_DEPARTMENT_OTHER): Payer: Self-pay | Admitting: Emergency Medicine

## 2015-02-15 DIAGNOSIS — M25552 Pain in left hip: Secondary | ICD-10-CM | POA: Diagnosis not present

## 2015-02-15 DIAGNOSIS — Z79899 Other long term (current) drug therapy: Secondary | ICD-10-CM | POA: Diagnosis not present

## 2015-02-15 DIAGNOSIS — E032 Hypothyroidism due to medicaments and other exogenous substances: Secondary | ICD-10-CM | POA: Diagnosis not present

## 2015-02-15 DIAGNOSIS — Z8719 Personal history of other diseases of the digestive system: Secondary | ICD-10-CM | POA: Insufficient documentation

## 2015-02-15 DIAGNOSIS — M5432 Sciatica, left side: Secondary | ICD-10-CM | POA: Diagnosis not present

## 2015-02-15 DIAGNOSIS — I10 Essential (primary) hypertension: Secondary | ICD-10-CM | POA: Insufficient documentation

## 2015-02-15 DIAGNOSIS — E041 Nontoxic single thyroid nodule: Secondary | ICD-10-CM | POA: Diagnosis not present

## 2015-02-15 DIAGNOSIS — Z8659 Personal history of other mental and behavioral disorders: Secondary | ICD-10-CM | POA: Diagnosis not present

## 2015-02-15 MED ORDER — HYDROCODONE-ACETAMINOPHEN 5-325 MG PO TABS
2.0000 | ORAL_TABLET | Freq: Once | ORAL | Status: AC
  Administered 2015-02-15: 2 via ORAL
  Filled 2015-02-15: qty 2

## 2015-02-15 MED ORDER — METHOCARBAMOL 500 MG PO TABS
1000.0000 mg | ORAL_TABLET | Freq: Once | ORAL | Status: AC
Start: 2015-02-15 — End: 2015-02-15
  Administered 2015-02-15: 1000 mg via ORAL
  Filled 2015-02-15: qty 2

## 2015-02-15 MED ORDER — METHOCARBAMOL 500 MG PO TABS: 500.0000 mg | ORAL_TABLET | Freq: Three times a day (TID) | ORAL | Status: DC | PRN

## 2015-02-15 MED ORDER — HYDROCODONE-ACETAMINOPHEN 5-325 MG PO TABS: 1.0000 | ORAL_TABLET | ORAL | Status: DC | PRN

## 2015-02-15 MED FILL — HYDROCODON-APAP 5-325: 5-325 | 2 days supply | Qty: 10 | Fill #0

## 2015-02-15 MED FILL — METHOCARBAMOL 500 MG TABLET: 500 | 6 days supply | Qty: 20 | Fill #0

## 2015-02-15 NOTE — ED Notes (Signed)
MD at bedside. 

## 2015-02-15 NOTE — Discharge Instructions (Signed)

## 2015-02-15 NOTE — ED Provider Notes (Signed)
CSN: [id number]     Arrival date & time 02/15/15  0901 History   First MD Initiated Contact with Patient 02/15/15 1005     Chief Complaint  Patient presents with  . Hip Pain     (Consider location/radiation/quality/duration/timing/severity/associated sxs/prior Treatment) HPI Patient presents with ongoing constant "sciatica pain" that radiates down her left leg. The pain is worse with movement. She denies any bladder or bowel incontinence. She's had no fever or chills. She denies any numbness or weakness. Patient is ambulatory. She denies any recent trauma. She is yet to see spinal surgeon or orthopedist. Patient is taking only Tylenol at home. States she does not tolerate Demerol or morphine. Patient has a history of lower gastric intestinal bleeding and has been advised against taking any NSAIDs. Past Medical History  Diagnosis Date  . Hypertension   . Hyperthyroidism     nodule on thyroid, Radioactive, no hypo  . Arthritis     knees  . Depression   . Diverticulosis     bleeding  . Hypothyroidism, iatrogenic     After RI now hypo on synthroid  . Diverticulitis   . GERD (gastroesophageal reflux disease)    Past Surgical History  Procedure Laterality Date  . Bladder surgery      bladder suspension  . Abdominal hysterectomy  1977  . Total knee revision  12/17/2010    Procedure: TOTAL KNEE REVISION;  Surgeon: Dione Plover Aluisio;  Location: [facility];  Service: Orthopedics;  Laterality: Right;  . Joint replacement Right 2009    total knee replacement  . Rotator cuff repair Left   . Cholecystectomy N/A 03/29/2013    Procedure: LAPAROSCOPIC CHOLECYSTECTOMY WITH INTRAOPERATIVE CHOLANGIOGRAM;  Surgeon: Edward Jolly, MD;  Location: [facility];  Service: General;  Laterality: N/A;   Family History  Problem Relation Age of Onset  . Diverticulitis Mother   . Colon cancer Maternal Grandmother   . Cancer Maternal Grandmother     colon  . Cancer Daughter     breast   Social History   Substance Use Topics  . Smoking status: Never Smoker   . Smokeless tobacco: Never Used  . Alcohol Use: No   OB History    No data available     Review of Systems  Constitutional: Negative for fever and chills.  Respiratory: Negative for shortness of breath.   Cardiovascular: Negative for chest pain, palpitations and leg swelling.  Gastrointestinal: Negative for nausea, vomiting, abdominal pain and diarrhea.  Genitourinary: Negative for dysuria, flank pain and difficulty urinating.  Musculoskeletal: Positive for back pain. Negative for neck pain and neck stiffness.  Skin: Negative for rash and wound.  Neurological: Negative for dizziness, weakness, light-headedness, numbness and headaches.  All other systems reviewed and are negative.     Allergies  Amitriptyline; Demerol; Demerol; Fish allergy; Nsaids; and Morphine and related  Home Medications   Prior to Admission medications   Medication Sig Start Date End Date Taking? Authorizing Provider  acetaminophen (TYLENOL) 325 MG tablet Take 650 mg by mouth every 6 (six) hours as needed.   Yes Historical Provider, MD  benazepril-hydrochlorthiazide (LOTENSIN HCT) 20-12.5 MG per tablet Take 1 tablet by mouth daily. 12/13/13  Yes Lanice Shirts, MD  levothyroxine (SYNTHROID, LEVOTHROID) 100 MCG tablet TAKE 1 TABLET (100 MCG TOTAL) BY MOUTH DAILY. 04/16/14  Yes Lanice Shirts, MD  vitamin B-12 (CYANOCOBALAMIN) 100 MCG tablet  12/13/13  Yes Historical Provider, MD  Cholecalciferol (VITAMIN D3) 5000 UNITS CAPS Take by  mouth.    Historical Provider, MD  HYDROcodone-acetaminophen (NORCO/VICODIN) 5-325 MG tablet Take 1 tablet by mouth every 4 (four) hours as needed for severe pain. 02/15/15   Julianne Rice, MD  methocarbamol (ROBAXIN) 500 MG tablet Take 1 tablet (500 mg total) by mouth every 8 (eight) hours as needed for muscle spasms. 02/15/15   Julianne Rice, MD   BP 146/82 mmHg  Pulse 90  Temp(Src) 98.2 F (36.8 C) (Oral)   Resp 18  Ht 5\' 2"  (1.575 m)  Wt 159 lb (72.122 kg)  BMI 29.07 kg/m2  SpO2 95% Physical Exam  Constitutional: She is oriented to person, place, and time. She appears well-developed and well-nourished.  Appears uncomfortable  HENT:  Head: Normocephalic and atraumatic.  Mouth/Throat: Oropharynx is clear and moist.  Eyes: EOM are normal. Pupils are equal, round, and reactive to light.  Neck: Normal range of motion. Neck supple.  Cardiovascular: Normal rate and regular rhythm.  Exam reveals no gallop and no friction rub.   No murmur heard. Pulmonary/Chest: Effort normal and breath sounds normal. No respiratory distress. She has no wheezes. She has no rales. She exhibits no tenderness.  Abdominal: Soft. Bowel sounds are normal. She exhibits no distension and no mass. There is no tenderness. There is no rebound and no guarding.  Musculoskeletal: Normal range of motion. She exhibits tenderness. She exhibits no edema.  Patient has tenderness to palpation of the left buttock. No midline thoracic, lumbar or sacral tenderness. Negative straight leg raise bilaterally. Distal pulses are 2+. No calf swelling or tenderness.  Neurological: She is alert and oriented to person, place, and time.  5/5 motor in all extremities. Sensation is fully intact. No saddle anesthesia.  Skin: Skin is warm and dry. No rash noted. No erythema.  Psychiatric: She has a normal mood and affect. Her behavior is normal.  Nursing note and vitals reviewed.   ED Course  Procedures (including critical care time) Labs Review Labs Reviewed - No data to display  Imaging Review No results found. I have personally reviewed and evaluated these images and lab results as part of my medical decision-making.   EKG Interpretation None      MDM   Final diagnoses:  Sciatica of left side   Patient with ongoing left-sided sciatica. Inadequately treated with Tylenol. No red flag signs or symptoms. We'll treat in the emergency  department with oral narcotic and observe for any intolerance or allergic reaction. The patient will need to follow-up with spinal surgery given that this pain is persistent and debilitating.  Patient states that the pain is improved. She tolerated the medication well. No adverse reactions. We'll discharge home with follow-up with neurosurgery. Return cautioned given.    Julianne Rice, MD 02/15/15 [phone]

## 2015-02-15 NOTE — ED Notes (Signed)
Pt reports that she has had hip pain since November, seen here in December, states she got injections in hip, saw pcp at [facility] but no new treatments, had xrays done yesterday with dr Alease Frame chiropractor, presents today because left hip and leg pain is worse

## 2015-02-19 ENCOUNTER — Other Ambulatory Visit: Payer: Self-pay

## 2015-02-19 DIAGNOSIS — Z1231 Encounter for screening mammogram for malignant neoplasm of breast: Secondary | ICD-10-CM

## 2015-02-20 DIAGNOSIS — M461 Sacroiliitis, not elsewhere classified: Secondary | ICD-10-CM | POA: Diagnosis not present

## 2015-02-20 DIAGNOSIS — M9904 Segmental and somatic dysfunction of sacral region: Secondary | ICD-10-CM | POA: Diagnosis not present

## 2015-02-20 DIAGNOSIS — M5137 Other intervertebral disc degeneration, lumbosacral region: Secondary | ICD-10-CM | POA: Diagnosis not present

## 2015-02-20 DIAGNOSIS — M9903 Segmental and somatic dysfunction of lumbar region: Secondary | ICD-10-CM | POA: Diagnosis not present

## 2015-02-20 DIAGNOSIS — M9905 Segmental and somatic dysfunction of pelvic region: Secondary | ICD-10-CM | POA: Diagnosis not present

## 2015-02-21 DIAGNOSIS — M9903 Segmental and somatic dysfunction of lumbar region: Secondary | ICD-10-CM | POA: Diagnosis not present

## 2015-02-21 DIAGNOSIS — M9904 Segmental and somatic dysfunction of sacral region: Secondary | ICD-10-CM | POA: Diagnosis not present

## 2015-02-21 DIAGNOSIS — M9905 Segmental and somatic dysfunction of pelvic region: Secondary | ICD-10-CM | POA: Diagnosis not present

## 2015-02-21 DIAGNOSIS — M5137 Other intervertebral disc degeneration, lumbosacral region: Secondary | ICD-10-CM | POA: Diagnosis not present

## 2015-02-21 DIAGNOSIS — M461 Sacroiliitis, not elsewhere classified: Secondary | ICD-10-CM | POA: Diagnosis not present

## 2015-02-22 DIAGNOSIS — M545 Low back pain: Secondary | ICD-10-CM | POA: Diagnosis not present

## 2015-03-02 DIAGNOSIS — M545 Low back pain: Secondary | ICD-10-CM | POA: Diagnosis not present

## 2015-03-04 ENCOUNTER — Other Ambulatory Visit: Payer: Self-pay | Admitting: Internal Medicine

## 2015-03-04 DIAGNOSIS — N644 Mastodynia: Secondary | ICD-10-CM | POA: Diagnosis not present

## 2015-03-04 DIAGNOSIS — M545 Low back pain: Secondary | ICD-10-CM | POA: Diagnosis not present

## 2015-03-04 DIAGNOSIS — E559 Vitamin D deficiency, unspecified: Secondary | ICD-10-CM | POA: Diagnosis not present

## 2015-03-04 DIAGNOSIS — K219 Gastro-esophageal reflux disease without esophagitis: Secondary | ICD-10-CM | POA: Diagnosis not present

## 2015-03-04 DIAGNOSIS — E7800 Pure hypercholesterolemia, unspecified: Secondary | ICD-10-CM | POA: Diagnosis not present

## 2015-03-04 DIAGNOSIS — I10 Essential (primary) hypertension: Secondary | ICD-10-CM | POA: Diagnosis not present

## 2015-03-11 DIAGNOSIS — M545 Low back pain: Secondary | ICD-10-CM | POA: Diagnosis not present

## 2015-03-13 ENCOUNTER — Ambulatory Visit
Admission: RE | Admit: 2015-03-13 | Discharge: 2015-03-13 | Disposition: A | Discharge status: Home / Self Care | Payer: PPO | Source: Ambulatory Visit | Attending: Internal Medicine | Admitting: Internal Medicine

## 2015-03-13 ENCOUNTER — Other Ambulatory Visit: Payer: Self-pay | Admitting: Internal Medicine

## 2015-03-13 DIAGNOSIS — N644 Mastodynia: Secondary | ICD-10-CM

## 2015-03-25 ENCOUNTER — Ambulatory Visit
Admission: RE | Admit: 2015-03-25 | Discharge: 2015-03-25 | Disposition: A | Discharge status: Home / Self Care | Payer: PPO | Source: Ambulatory Visit | Attending: Internal Medicine | Admitting: Internal Medicine

## 2015-03-25 ENCOUNTER — Ambulatory Visit: Payer: PPO

## 2015-03-25 ENCOUNTER — Other Ambulatory Visit: Payer: Self-pay | Admitting: Internal Medicine

## 2015-03-25 DIAGNOSIS — N644 Mastodynia: Secondary | ICD-10-CM | POA: Diagnosis not present

## 2015-03-25 DIAGNOSIS — R928 Other abnormal and inconclusive findings on diagnostic imaging of breast: Secondary | ICD-10-CM | POA: Diagnosis not present

## 2015-04-02 DIAGNOSIS — M545 Low back pain: Secondary | ICD-10-CM | POA: Diagnosis not present

## 2015-04-04 ENCOUNTER — Emergency Department (HOSPITAL_COMMUNITY): Payer: PPO

## 2015-04-04 ENCOUNTER — Encounter (HOSPITAL_COMMUNITY): Payer: Self-pay | Admitting: Emergency Medicine

## 2015-04-04 ENCOUNTER — Observation Stay (HOSPITAL_COMMUNITY)
Admission: EM | Admit: 2015-04-04 | Discharge: 2015-04-05 | Disposition: A | Discharge status: Home / Self Care | Payer: PPO | Attending: Internal Medicine | Admitting: Internal Medicine

## 2015-04-04 DIAGNOSIS — R197 Diarrhea, unspecified: Secondary | ICD-10-CM | POA: Diagnosis not present

## 2015-04-04 DIAGNOSIS — I129 Hypertensive chronic kidney disease with stage 1 through stage 4 chronic kidney disease, or unspecified chronic kidney disease: Secondary | ICD-10-CM | POA: Diagnosis not present

## 2015-04-04 DIAGNOSIS — I10 Essential (primary) hypertension: Secondary | ICD-10-CM | POA: Diagnosis present

## 2015-04-04 DIAGNOSIS — Z90710 Acquired absence of both cervix and uterus: Secondary | ICD-10-CM | POA: Insufficient documentation

## 2015-04-04 DIAGNOSIS — K219 Gastro-esophageal reflux disease without esophagitis: Secondary | ICD-10-CM

## 2015-04-04 DIAGNOSIS — R112 Nausea with vomiting, unspecified: Secondary | ICD-10-CM | POA: Diagnosis not present

## 2015-04-04 DIAGNOSIS — K5792 Diverticulitis of intestine, part unspecified, without perforation or abscess without bleeding: Secondary | ICD-10-CM | POA: Diagnosis not present

## 2015-04-04 DIAGNOSIS — E032 Hypothyroidism due to medicaments and other exogenous substances: Secondary | ICD-10-CM | POA: Insufficient documentation

## 2015-04-04 DIAGNOSIS — Z96651 Presence of right artificial knee joint: Secondary | ICD-10-CM | POA: Insufficient documentation

## 2015-04-04 DIAGNOSIS — K921 Melena: Secondary | ICD-10-CM | POA: Diagnosis not present

## 2015-04-04 DIAGNOSIS — K5730 Diverticulosis of large intestine without perforation or abscess without bleeding: Secondary | ICD-10-CM | POA: Diagnosis not present

## 2015-04-04 DIAGNOSIS — K529 Noninfective gastroenteritis and colitis, unspecified: Secondary | ICD-10-CM | POA: Diagnosis not present

## 2015-04-04 DIAGNOSIS — Z79899 Other long term (current) drug therapy: Secondary | ICD-10-CM | POA: Insufficient documentation

## 2015-04-04 DIAGNOSIS — D72829 Elevated white blood cell count, unspecified: Secondary | ICD-10-CM | POA: Diagnosis not present

## 2015-04-04 DIAGNOSIS — Z9049 Acquired absence of other specified parts of digestive tract: Secondary | ICD-10-CM | POA: Diagnosis not present

## 2015-04-04 DIAGNOSIS — R1084 Generalized abdominal pain: Secondary | ICD-10-CM | POA: Diagnosis not present

## 2015-04-04 DIAGNOSIS — N183 Chronic kidney disease, stage 3 (moderate): Secondary | ICD-10-CM | POA: Diagnosis not present

## 2015-04-04 DIAGNOSIS — R109 Unspecified abdominal pain: Secondary | ICD-10-CM | POA: Diagnosis not present

## 2015-04-04 LAB — URINALYSIS, ROUTINE W REFLEX MICROSCOPIC
Bilirubin Urine: NEGATIVE
GLUCOSE, UA: NEGATIVE mg/dL
Hgb urine dipstick: NEGATIVE
KETONES UR: NEGATIVE mg/dL
LEUKOCYTES UA: NEGATIVE
NITRITE: NEGATIVE
PH: 6 (ref 5.0–8.0)
Protein, ur: NEGATIVE mg/dL
SPECIFIC GRAVITY, URINE: 1.019 (ref 1.005–1.030)

## 2015-04-04 LAB — COMPREHENSIVE METABOLIC PANEL
ALBUMIN: 4.4 g/dL (ref 3.5–5.0)
ALT: 18 U/L (ref 14–54)
ANION GAP: 13 (ref 5–15)
AST: 22 U/L (ref 15–41)
Alkaline Phosphatase: 70 U/L (ref 38–126)
BUN: 21 mg/dL — AB (ref 6–20)
CHLORIDE: 101 mmol/L (ref 101–111)
CO2: 23 mmol/L (ref 22–32)
Calcium: 9.5 mg/dL (ref 8.9–10.3)
Creatinine, Ser: 1.19 mg/dL — ABNORMAL HIGH (ref 0.44–1.00)
GFR calc Af Amer: 52 mL/min — ABNORMAL LOW (ref >60)
GFR calc non Af Amer: 44 mL/min — ABNORMAL LOW (ref >60)
GLUCOSE: 107 mg/dL — AB (ref 65–99)
POTASSIUM: 3.5 mmol/L (ref 3.5–5.1)
SODIUM: 137 mmol/L (ref 135–145)
TOTAL PROTEIN: 7.5 g/dL (ref 6.5–8.1)
Total Bilirubin: 0.8 mg/dL (ref 0.3–1.2)

## 2015-04-04 LAB — CBC
HEMATOCRIT: 40 % (ref 36.0–46.0)
HEMOGLOBIN: 13.8 g/dL (ref 12.0–15.0)
MCH: 30.5 pg (ref 26.0–34.0)
MCHC: 34.5 g/dL (ref 30.0–36.0)
MCV: 88.5 fL (ref 78.0–100.0)
Platelets: 271 10*3/uL (ref 150–400)
RBC: 4.52 MIL/uL (ref 3.87–5.11)
RDW: 14.1 % (ref 11.5–15.5)
WBC: 16.2 10*3/uL — ABNORMAL HIGH (ref 4.0–10.5)

## 2015-04-04 LAB — LIPASE, BLOOD: LIPASE: 22 U/L (ref 11–51)

## 2015-04-04 MED ORDER — METRONIDAZOLE IN NACL 5-0.79 MG/ML-% IV SOLN
500.0000 mg | Freq: Once | INTRAVENOUS | Status: DC
  Filled 2015-04-04: qty 100

## 2015-04-04 MED ORDER — HYDROMORPHONE HCL 1 MG/ML IJ SOLN: 1.0000 mg | INTRAMUSCULAR | Status: DC | PRN

## 2015-04-04 MED ORDER — SODIUM CHLORIDE 0.9 % IV SOLN
INTRAVENOUS | Status: DC
Start: 2015-04-04 — End: 2015-04-05

## 2015-04-04 MED ORDER — ACETAMINOPHEN 650 MG RE SUPP: 650.0000 mg | Freq: Four times a day (QID) | RECTAL | Status: DC | PRN

## 2015-04-04 MED ORDER — METRONIDAZOLE IN NACL 5-0.79 MG/ML-% IV SOLN
500.0000 mg | Freq: Three times a day (TID) | INTRAVENOUS | Status: DC
  Administered 2015-04-04 – 2015-04-05 (×3): 500 mg via INTRAVENOUS
  Filled 2015-04-04 (×3): qty 100

## 2015-04-04 MED ORDER — ENOXAPARIN SODIUM 30 MG/0.3ML ~~LOC~~ SOLN
30.0000 mg | SUBCUTANEOUS | Status: DC
  Filled 2015-04-04 (×2): qty 0.3

## 2015-04-04 MED ORDER — ACETAMINOPHEN 325 MG PO TABS
650.0000 mg | ORAL_TABLET | Freq: Four times a day (QID) | ORAL | Status: DC | PRN
  Administered 2015-04-05: 650 mg via ORAL

## 2015-04-04 MED ORDER — BENAZEPRIL-HYDROCHLOROTHIAZIDE 20-12.5 MG PO TABS: 1.0000 | ORAL_TABLET | Freq: Every day | ORAL | Status: DC

## 2015-04-04 MED ORDER — IOHEXOL 300 MG/ML  SOLN
25.0000 mL | Freq: Once | INTRAMUSCULAR | Status: AC | PRN
  Administered 2015-04-04: 25 mL via ORAL

## 2015-04-04 MED ORDER — LEVOTHYROXINE SODIUM 75 MCG PO TABS
75.0000 ug | ORAL_TABLET | Freq: Every day | ORAL | Status: DC
  Administered 2015-04-05: 75 ug via ORAL
  Filled 2015-04-04 (×2): qty 1

## 2015-04-04 MED ORDER — CIPROFLOXACIN IN D5W 400 MG/200ML IV SOLN
400.0000 mg | Freq: Two times a day (BID) | INTRAVENOUS | Status: DC
  Administered 2015-04-05: 400 mg via INTRAVENOUS
  Filled 2015-04-04: qty 200

## 2015-04-04 MED ORDER — HYDROCODONE-ACETAMINOPHEN 5-325 MG PO TABS
1.0000 | ORAL_TABLET | ORAL | Status: DC | PRN
  Administered 2015-04-04: 1 via ORAL
  Filled 2015-04-04: qty 1

## 2015-04-04 MED ORDER — HYDROCHLOROTHIAZIDE 12.5 MG PO CAPS
12.5000 mg | ORAL_CAPSULE | Freq: Every day | ORAL | Status: DC
  Administered 2015-04-05: 12.5 mg via ORAL
  Filled 2015-04-04: qty 1

## 2015-04-04 MED ORDER — BENAZEPRIL HCL 20 MG PO TABS
20.0000 mg | ORAL_TABLET | Freq: Every day | ORAL | Status: DC
  Administered 2015-04-05: 20 mg via ORAL
  Filled 2015-04-04: qty 1

## 2015-04-04 MED ORDER — ACETAMINOPHEN 325 MG PO TABS
650.0000 mg | ORAL_TABLET | Freq: Four times a day (QID) | ORAL | Status: DC | PRN
  Filled 2015-04-04: qty 2

## 2015-04-04 MED ORDER — CIPROFLOXACIN IN D5W 400 MG/200ML IV SOLN
400.0000 mg | Freq: Once | INTRAVENOUS | Status: AC
  Administered 2015-04-04: 400 mg via INTRAVENOUS
  Filled 2015-04-04: qty 200

## 2015-04-04 MED ORDER — DIPHENHYDRAMINE-ZINC ACETATE 2-0.1 % EX CREA
TOPICAL_CREAM | Freq: Two times a day (BID) | CUTANEOUS | Status: DC | PRN
  Filled 2015-04-04: qty 28

## 2015-04-04 MED ORDER — SODIUM CHLORIDE 0.9 % IV BOLUS (SEPSIS)
500.0000 mL | Freq: Once | INTRAVENOUS | Status: AC
  Administered 2015-04-04: 500 mL via INTRAVENOUS

## 2015-04-04 NOTE — ED Provider Notes (Signed)
Pt signed out to me at shift change. Pt with abdominal pain since yesterday. Bright red blood in stool yesterday and this morning. Pt was sent here from PCP for imaging. Pt states she is still having pain today. She reports vomiting yesterday, has not tried to eat anything today. Pain is diffuse. Hx of diverticulosis. Pt waiting on CT abd/pelvis  Urinalysis, Routine w reflex microscopic (not at [facility]) (Final result) Component (Lab Inquiry)    Collection Time Result Time Color, Urine APPearance Specific Gravity, Urine pH GLUCOSE U   04/04/15 13:47:00 04/04/15 14:04:00 YELLOW CLEAR 1.019 6.0 NEGATIVE      Collection Time Result Time Hgb urine dipstick BILI UR Ketones, ur Protein, ur Nitrite   04/04/15 13:47:00 04/04/15 14:04:00 _0       Collection Time Result Time LEUKOCYTES   04/04/15 13:47:00 04/04/15 14:04:00  NEGATIVE    (Comment)    MICROSCOPIC NOT DONE ON URINES WITH NEGATIVE PROTEIN, BLOOD, LEUKOCYTES, NITRITE, OR GLUCOSE <1000 mg/dL.                 Lipase, blood (Final result) Component (Lab Inquiry)    Collection Time Result Time LIPASE   04/04/15 12:03:00 04/04/15 12:47:07 22             Comprehensive metabolic panel (Final result)Abnormal Component (Lab Inquiry)    Collection Time Result Time NA K CL CO2 GLUCOSE   04/04/15 12:03:00 04/04/15 12:47:07 137 3.5 101 23 107 (H)      Collection Time Result Time BUN Creatinine, Ser CALCIUM PROTEIN Albumin   04/04/15 12:03:00 04/04/15 12:47:07 21 (H) 1.19 (H) 9.5 7.5 4.4      Collection Time Result Time AST ALT ALK PHOS BILI TOTL GFR calc non Af Amer   04/04/15 12:03:00 04/04/15 12:47:07 22 18 70 0.8 44 (L)      Collection Time Result Time GFR calc Af Amer Anion gap   04/04/15 12:03:00 04/04/15 12:47:07  52 (L)    (Comment)    (NOTE)  The eGFR has been calculated using the CKD EPI equation.  This calculation has not been validated in all  clinical situations.  eGFR's persistently <60 mL/min signify possible Chronic Kidney  Disease.       13             CBC (Final result)Abnormal Component (Lab Inquiry)    Collection Time Result Time WBC RBC HGB HCT MCV   04/04/15 12:03:00 04/04/15 12:17:24 16.2 (H) 4.52 13.8 40.0 88.5      Collection Time Result Time MCH MCHC RDW PLT   04/04/15 12:03:00 04/04/15 12:17:24 30.5 34.5 14.1 271           Imaging Results       CT Abdomen Pelvis W Contrast (Final result) Result time: 04/04/15 16:44:57   Final result by Rad Results In Interface (04/04/15 16:44:57)   Narrative:   CLINICAL DATA: History of diverticulitis, GERD. Presents with nausea, vomiting, diarrhea, hematochezia and diffuse abdominal pain beginning last night. Crampy, burning abdominal pain after eating for the past month.  EXAM: CT ABDOMEN AND PELVIS WITH CONTRAST  TECHNIQUE: Multidetector CT imaging of the abdomen and pelvis was performed using the standard protocol following bolus administration of intravenous contrast.  CONTRAST: 80 cc omni 300  COMPARISON: None.  FINDINGS: Lower chest: No acute findings.  Hepatobiliary: Status post cholecystectomy with associated mild bile duct ectasia. Liver appears normal.  Pancreas: No mass, inflammatory changes, or other significant abnormality.  Spleen: Within normal  limits in size and appearance.  Adrenals/Urinary Tract: Adrenal glands appear normal. Stable left renal cyst. Multiple nonobstructing renal stones again noted bilaterally, largest on the left measuring up to 6 mm. No hydronephrosis bilaterally. No ureteral or bladder calculi identified.  Stomach/Bowel: Thickening of the walls of a fairly long segment of the left colon, extending from the distal transverse colon to the distal descending colon, with associated pericolic fluid/ inflammation, consistent with colitis. No pericolic abscess collection. Scattered  diverticulosis noted within the sigmoid colon without evidence of acute diverticulitis.  No large or small bowel dilatation. Appendix is not seen but there are no inflammatory changes about the cecum to suggest acute appendicitis.  Vascular/Lymphatic: No pathologically enlarged lymph nodes. No evidence of abdominal aortic aneurysm.  Reproductive: No mass or other significant abnormality.  Other: No free fluid or abscess collections seen. No free intraperitoneal air.  Musculoskeletal: Degenerative changes throughout the thoracolumbar spine, mild to moderate in degree. No acute or suspicious osseous lesions seen. Superficial soft tissues are unremarkable.  IMPRESSION: 1. Acute colitis involving a fairly long segment of the left colon, involving the distal transverse colon to the distal descending colon, with associated bowel wall thickening and paracolic inflammation. Differential would include infectious, inflammatory and ischemic etiologies. Favor infectious or inflammatory colitis. No paracolic abscess collection. No free intraperitoneal air. No associated bowel obstruction. 2. Sigmoid diverticulosis without evidence of acute diverticulitis. Bilateral nephrolithiasis. Additional chronic/incidental findings detailed above.   Electronically Signed By: Franki Cabot M.D. On: 04/04/2015 16:44     5:20 PM Pt's ct showing  Acute colitis involving left colon, distal transverse colon, distal descending colon. PT is 74, has elevated wbc of 16.2, and hematochizia. Will admit for observation. She is hemodynamically stable at this time. No active vomiting.   Jeannett Senior, PA-C 04/05/15 0150  Charlesetta Shanks, MD 04/08/15 [phone]

## 2015-04-04 NOTE — ED Notes (Signed)
Pt has been diagnosed by PCP with Diverticulitis.  Pain after eating x 1 week with progression of cramping and beginning of diarrhea.  Woke last night in cold sweat.  Saw PCP today, received 1 gram Rocephin IM and was sent here for CT scan.

## 2015-04-04 NOTE — ED Provider Notes (Signed)
CSN: [id number]     Arrival date & time 04/04/15  1106 History   First MD Initiated Contact with Patient 04/04/15 1348     Chief Complaint  Patient presents with  . Abdominal Pain  . Diverticulitis     (Consider location/radiation/quality/duration/timing/severity/associated sxs/prior Treatment) The history is provided by the patient.     Pt with hx diverticulitis, GERD, p/w N/V/D, hematochezia, and diffuse abdominal pain that began last night.  For the past month she has had crampy, burning abdominal pain after eating with no other symptoms. Last night after dinner the vomiting and diarrhea lasted for an hour then during the night she passed only coagulated blood from the rectum.  No blood in the emesis.  Pt saw her PCP today who sent her to ED for CT abd/pelvis, concern for recurrent diverticulitis.  Pt currently denies nausea, declines pain medication.  Past abdominal surgeries: cholecystectomy, hysterectomy   Past Medical History  Diagnosis Date  . Hypertension   . Hyperthyroidism     nodule on thyroid, Radioactive, no hypo  . Arthritis     knees  . Depression   . Diverticulosis     bleeding  . Hypothyroidism, iatrogenic     After RI now hypo on synthroid  . Diverticulitis   . GERD (gastroesophageal reflux disease)    Past Surgical History  Procedure Laterality Date  . Bladder surgery      bladder suspension  . Abdominal hysterectomy  1977  . Total knee revision  12/17/2010    Procedure: TOTAL KNEE REVISION;  Surgeon: Dione Plover Aluisio;  Location: [facility];  Service: Orthopedics;  Laterality: Right;  . Joint replacement Right 2009    total knee replacement  . Rotator cuff repair Left   . Cholecystectomy N/A 03/29/2013    Procedure: LAPAROSCOPIC CHOLECYSTECTOMY WITH INTRAOPERATIVE CHOLANGIOGRAM;  Surgeon: Edward Jolly, MD;  Location: [facility];  Service: General;  Laterality: N/A;   Family History  Problem Relation Age of Onset  . Diverticulitis Mother   . Colon cancer  Maternal Grandmother   . Cancer Maternal Grandmother     colon  . Cancer Daughter     breast   Social History  Substance Use Topics  . Smoking status: Never Smoker   . Smokeless tobacco: Never Used  . Alcohol Use: No   OB History    No data available     Review of Systems  All other systems reviewed and are negative.     Allergies  Amitriptyline; Aspirin; Demerol; Demerol; Fish allergy; Nsaids; and Morphine and related  Home Medications   Prior to Admission medications   Medication Sig Start Date End Date Taking? Authorizing Provider  acetaminophen (TYLENOL) 325 MG tablet Take 650 mg by mouth every 6 (six) hours as needed for mild pain.    Yes Historical Provider, MD  benazepril-hydrochlorthiazide (LOTENSIN HCT) 20-12.5 MG per tablet Take 1 tablet by mouth daily. 12/13/13  Yes Lanice Shirts, MD  Cholecalciferol (VITAMIN D3) 5000 UNITS CAPS Take by mouth daily.    Yes Historical Provider, MD  levothyroxine (SYNTHROID, LEVOTHROID) 75 MCG tablet Take 75 mcg by mouth daily. 03/19/15  Yes Historical Provider, MD  Multiple Vitamins-Minerals (HAIR/SKIN/NAILS PO) Take 1 tablet by mouth daily.   Yes Historical Provider, MD  vitamin B-12 (CYANOCOBALAMIN) 100 MCG tablet daily.  12/13/13  Yes Historical Provider, MD  HYDROcodone-acetaminophen (NORCO/VICODIN) 5-325 MG tablet Take 1 tablet by mouth every 4 (four) hours as needed for severe pain. Patient not taking:  Reported on 04/04/2015 02/15/15   Julianne Rice, MD  methocarbamol (ROBAXIN) 500 MG tablet Take 1 tablet (500 mg total) by mouth every 8 (eight) hours as needed for muscle spasms. Patient not taking: Reported on 04/04/2015 02/15/15   Julianne Rice, MD   BP 154/82 mmHg  Pulse 100  Temp(Src) 98.4 F (36.9 C) (Oral)  Resp 16  SpO2 99% Physical Exam  Constitutional: She appears well-developed and well-nourished. No distress.  HENT:  Head: Normocephalic and atraumatic.  Neck: Neck supple.  Cardiovascular: Normal rate  and regular rhythm.   Pulmonary/Chest: Effort normal and breath sounds normal. No respiratory distress. She has no wheezes. She has no rales.  Abdominal: Soft. She exhibits no distension. There is tenderness. There is no rebound and no guarding.  Neurological: She is alert.  Skin: She is not diaphoretic.  Nursing note and vitals reviewed. Pt declines rectal exam.    ED Course  Procedures (including critical care time) Labs Review Labs Reviewed  COMPREHENSIVE METABOLIC PANEL - Abnormal; Notable for the following:    Glucose, Bld 107 (*)    BUN 21 (*)    Creatinine, Ser 1.19 (*)    GFR calc non Af Amer 44 (*)    GFR calc Af Amer 52 (*)    All other components within normal limits  CBC - Abnormal; Notable for the following:    WBC 16.2 (*)    All other components within normal limits  LIPASE, BLOOD  URINALYSIS, ROUTINE W REFLEX MICROSCOPIC (NOT AT [facility])  POC OCCULT BLOOD, ED    Imaging Review No results found. I have personally reviewed and evaluated these images and lab results as part of my medical decision-making.   EKG Interpretation None      MDM   Final diagnoses:  Generalized abdominal pain  Nausea vomiting and diarrhea   Afebrile nontoxic patient with abdominal pain, N/V/D, hematochezia.  Sent by PCP to r/o diverticulitis.  Labs significant for leukocytosis (WBC 16), renal insufficiency.  CT abd/pelvis pending at change of shift.  Pt declines rectal exam as PCP did one earlier today - I explained that we do not have those results but she did agree to catch any BM she has while in ED.  Signed out to [employer], PA-C, who assumes care of patient at change of shift pending CT.     Clayton Bibles, PA-C 04/04/15 Mona, MD 04/05/15 [phone]

## 2015-04-04 NOTE — ED Notes (Signed)
Patient transported to CT 

## 2015-04-04 NOTE — H&P (Signed)
[facility] History and Physical  [name] [id number] DOB: 1940/02/15 DOA: 04/04/2015  Referring physician: Ms. Jerene Dilling, PA EDP PCP: Kelton Pillar, MD  Specialists: Dr. Collene Mares, gastroenterologist  Chief Complaint: Abdominal pain  HPI: [name] is a 75 y.o. female  With a history of diverticulosis and diverticulitis, GERD, hypothyroidism, hypertension that presented to the emergency department with complains of abdominal pain and questionable diverticulitis. Patient states over the past month she's been having some mild abdominal pain however last night she began having a men's abdominal pain all over her abdomen. States it felt like her diverticulitis in the past. Patient was able to have some dinner last night however that was followed by vomiting as well as diarrhea. She did notice that she had bright red blood per rectum. Patient denies any bloody emesis. Patient to present to her primary care physician's office today who sent her to emergency department to obtain CT of the abdomen and pelvis for concern of recurrent diverticulitis. At this time, patient denies any chest pain, shortness of breath, dizziness or headache. She did state that she sees Dr. Collene Mares, gastroenterologist. Patient did have a colonoscopy approximately 1 year ago which showed 1 polyp, she will return for repeat in 5 years. In the emergency department, CT abdomen and pelvis did show acute colitis. [facility] called for admission.  Review of Systems:  Constitutional: Denies fever, chills, diaphoresis, appetite change and fatigue.  HEENT: Denies photophobia, eye pain, redness, hearing loss, ear pain, congestion, sore throat, rhinorrhea, sneezing, mouth sores, trouble swallowing, neck pain, neck stiffness and tinnitus.   Respiratory: Denies SOB, DOE, cough, chest tightness,  and wheezing.   Cardiovascular: Denies chest pain, palpitations and leg swelling.  Gastrointestinal: Abdominal pain, nausea,  vomiting, diarrhea. Genitourinary: Denies dysuria, urgency, frequency, hematuria, flank pain and difficulty urinating.  Musculoskeletal: Denies myalgias, back pain, joint swelling, arthralgias and gait problem.  Skin: Denies pallor, rash and wound.  Neurological: Denies dizziness, seizures, syncope, weakness, light-headedness, numbness and headaches.  Hematological: Denies adenopathy. Easy bruising, personal or family bleeding history  Psychiatric/Behavioral: Denies suicidal ideation, mood changes, confusion, nervousness, sleep disturbance and agitation  Past Medical History  Diagnosis Date  . Hypertension   . Hyperthyroidism     nodule on thyroid, Radioactive, no hypo  . Arthritis     knees  . Depression   . Diverticulosis     bleeding  . Hypothyroidism, iatrogenic     After RI now hypo on synthroid  . Diverticulitis   . GERD (gastroesophageal reflux disease)    Past Surgical History  Procedure Laterality Date  . Bladder surgery      bladder suspension  . Abdominal hysterectomy  1977  . Total knee revision  12/17/2010    Procedure: TOTAL KNEE REVISION;  Surgeon: Dione Plover Aluisio;  Location: [facility];  Service: Orthopedics;  Laterality: Right;  . Joint replacement Right 2009    total knee replacement  . Rotator cuff repair Left   . Cholecystectomy N/A 03/29/2013    Procedure: LAPAROSCOPIC CHOLECYSTECTOMY WITH INTRAOPERATIVE CHOLANGIOGRAM;  Surgeon: Edward Jolly, MD;  Location: [facility];  Service: General;  Laterality: N/A;   Social History:  reports that she has never smoked. She has never used smokeless tobacco. She reports that she does not drink alcohol or use illicit drugs.  Allergies  Allergen Reactions  . Amitriptyline Other (See Comments)    Intolerance, hallucinations  . Aspirin     Bleeding.   . Demerol Other (See Comments)  migraines  . Demerol [Meperidine]   . Fish Allergy Nausea Only  . Nsaids     Lower GI bleeding  . Morphine And Related Rash     Family History  Problem Relation Age of Onset  . Diverticulitis Mother   . Colon cancer Maternal Grandmother   . Cancer Maternal Grandmother     colon  . Cancer Daughter     breast     Prior to Admission medications   Medication Sig Start Date End Date Taking? Authorizing Provider  acetaminophen (TYLENOL) 325 MG tablet Take 650 mg by mouth every 6 (six) hours as needed for mild pain.    Yes Historical Provider, MD  benazepril-hydrochlorthiazide (LOTENSIN HCT) 20-12.5 MG per tablet Take 1 tablet by mouth daily. 12/13/13  Yes Lanice Shirts, MD  Cholecalciferol (VITAMIN D3) 5000 UNITS CAPS Take by mouth daily.    Yes Historical Provider, MD  levothyroxine (SYNTHROID, LEVOTHROID) 75 MCG tablet Take 75 mcg by mouth daily. 03/19/15  Yes Historical Provider, MD  Multiple Vitamins-Minerals (HAIR/SKIN/NAILS PO) Take 1 tablet by mouth daily.   Yes Historical Provider, MD  vitamin B-12 (CYANOCOBALAMIN) 100 MCG tablet daily.  12/13/13  Yes Historical Provider, MD  HYDROcodone-acetaminophen (NORCO/VICODIN) 5-325 MG tablet Take 1 tablet by mouth every 4 (four) hours as needed for severe pain. Patient not taking: Reported on 04/04/2015 02/15/15   Julianne Rice, MD  methocarbamol (ROBAXIN) 500 MG tablet Take 1 tablet (500 mg total) by mouth every 8 (eight) hours as needed for muscle spasms. Patient not taking: Reported on 04/04/2015 02/15/15   Julianne Rice, MD   Physical Exam: Filed Vitals:   04/04/15 1127  BP: 154/82  Pulse: 100  Temp: 98.4 F (36.9 C)  Resp: 16     General: Well developed, well nourished, NAD, appears stated age  HEENT: NCAT, PERRLA, EOMI, Anicteic Sclera, mucous membranes mildly dry.   Neck: Supple, no JVD, no masses  Cardiovascular: S1 S2 auscultated, no rubs, murmurs or gallops. Regular rate and rhythm.  Respiratory: Clear to auscultation bilaterally with equal chest rise  Abdomen: Soft, Diffusely TTP, more LUQ, LLQ. Nondistended, + bowel  sounds  Extremities: warm dry without cyanosis clubbing or edema  Neuro: AAOx3, cranial nerves grossly intact. Strength 5/5 in patient's upper and lower extremities bilaterally  Skin: Without rashes exudates or nodules  Psych: Normal affect and demeanor with intact judgement and insight  Labs on Admission:  Basic Metabolic Panel:  Recent Labs Lab 04/04/15 1203  NA 137  K 3.5  CL 101  CO2 23  GLUCOSE 107*  BUN 21*  CREATININE 1.19*  CALCIUM 9.5   Liver Function Tests:  Recent Labs Lab 04/04/15 1203  AST 22  ALT 18  ALKPHOS 70  BILITOT 0.8  PROT 7.5  ALBUMIN 4.4    Recent Labs Lab 04/04/15 1203  LIPASE 22   No results for input(s): AMMONIA in the last 168 hours. CBC:  Recent Labs Lab 04/04/15 1203  WBC 16.2*  HGB 13.8  HCT 40.0  MCV 88.5  PLT 271   Cardiac Enzymes: No results for input(s): CKTOTAL, CKMB, CKMBINDEX, TROPONINI in the last 168 hours.  BNP (last 3 results) No results for input(s): BNP in the last 8760 hours.  ProBNP (last 3 results) No results for input(s): PROBNP in the last 8760 hours.  CBG: No results for input(s): GLUCAP in the last 168 hours.  Radiological Exams on Admission: Ct Abdomen Pelvis W Contrast  04/04/2015  CLINICAL DATA:  History of  diverticulitis, GERD. Presents with nausea, vomiting, diarrhea, hematochezia and diffuse abdominal pain beginning last night. Crampy, burning abdominal pain after eating for the past month. EXAM: CT ABDOMEN AND PELVIS WITH CONTRAST TECHNIQUE: Multidetector CT imaging of the abdomen and pelvis was performed using the standard protocol following bolus administration of intravenous contrast. CONTRAST:  80 cc omni 300 COMPARISON:  None. FINDINGS: Lower chest:  No acute findings. Hepatobiliary: Status post cholecystectomy with associated mild bile duct ectasia. Liver appears normal. Pancreas: No mass, inflammatory changes, or other significant abnormality. Spleen: Within normal limits in size and  appearance. Adrenals/Urinary Tract: Adrenal glands appear normal. Stable left renal cyst. Multiple nonobstructing renal stones again noted bilaterally, largest on the left measuring up to 6 mm. No hydronephrosis bilaterally. No ureteral or bladder calculi identified. Stomach/Bowel: Thickening of the walls of a fairly long segment of the left colon, extending from the distal transverse colon to the distal descending colon, with associated pericolic fluid/ inflammation, consistent with colitis. No pericolic abscess collection. Scattered diverticulosis noted within the sigmoid colon without evidence of acute diverticulitis. No large or small bowel dilatation. Appendix is not seen but there are no inflammatory changes about the cecum to suggest acute appendicitis. Vascular/Lymphatic: No pathologically enlarged lymph nodes. No evidence of abdominal aortic aneurysm. Reproductive: No mass or other significant abnormality. Other: No free fluid or abscess collections seen. No free intraperitoneal air. Musculoskeletal: Degenerative changes throughout the thoracolumbar spine, mild to moderate in degree. No acute or suspicious osseous lesions seen. Superficial soft tissues are unremarkable. IMPRESSION: 1. Acute colitis involving a fairly long segment of the left colon, involving the distal transverse colon to the distal descending colon, with associated bowel wall thickening and paracolic inflammation. Differential would include infectious, inflammatory and ischemic etiologies. Favor infectious or inflammatory colitis. No paracolic abscess collection. No free intraperitoneal air. No associated bowel obstruction. 2. Sigmoid diverticulosis without evidence of acute diverticulitis. Bilateral nephrolithiasis. Additional chronic/incidental findings detailed above. Electronically Signed   By: Franki Cabot M.D.   On: 04/04/2015 16:44    EKG: None  Assessment/Plan  Acute colitis/ hematochezia -CT abd/pelvis: Acute colitis  involving a fairly long segment of the left colon involving the distal transverse colon to the distal descending colon associated with thickening and pericolic inflammation. Sigmoid diverticulosis without evidence of acute diverticulitis. -Will place patient on IVF and full liquid diet if she can tolerate -Pain control, place on cipro/flagyl -Patient did have one episode of blood in her stool last night.  Currently hemoglobin stable. Continue to monitor CBC -Patient had a colonoscopy 02/2014, with Dr. Collene Mares  Leukocytosis -Likely secondary to the above, continue to monitor CBC  Essential hypertension -Continue Lotensin  Hypothyroidism -Continue Synthroid  Chronic kidney disease, stage III -Cr 1.19, currently  Stable -Continue to monitor BMP  DVT prophylaxis: Lovenox  Code Status: Full  Condition: Guarded   Family Communication: Family at bedside. Admission, patients condition and plan of care including tests being ordered have been discussed with the patient and family who indicate understanding and agree with the plan and Code Status.  Disposition Plan: Admitted for observation  Time spent: 50 minutes  Sharell Hilmer D.O. [facility] Pager [phone]  If 7PM-7AM, please contact night-coverage [email or web address] Password [facility] 04/04/2015, 5:48 PM

## 2015-04-05 DIAGNOSIS — I10 Essential (primary) hypertension: Secondary | ICD-10-CM | POA: Diagnosis not present

## 2015-04-05 DIAGNOSIS — R1032 Left lower quadrant pain: Secondary | ICD-10-CM | POA: Diagnosis not present

## 2015-04-05 DIAGNOSIS — K219 Gastro-esophageal reflux disease without esophagitis: Secondary | ICD-10-CM | POA: Diagnosis not present

## 2015-04-05 DIAGNOSIS — N183 Chronic kidney disease, stage 3 (moderate): Secondary | ICD-10-CM

## 2015-04-05 DIAGNOSIS — K529 Noninfective gastroenteritis and colitis, unspecified: Secondary | ICD-10-CM | POA: Diagnosis not present

## 2015-04-05 DIAGNOSIS — R933 Abnormal findings on diagnostic imaging of other parts of digestive tract: Secondary | ICD-10-CM | POA: Diagnosis not present

## 2015-04-05 DIAGNOSIS — A09 Infectious gastroenteritis and colitis, unspecified: Secondary | ICD-10-CM | POA: Diagnosis not present

## 2015-04-05 DIAGNOSIS — D72829 Elevated white blood cell count, unspecified: Secondary | ICD-10-CM | POA: Diagnosis not present

## 2015-04-05 LAB — COMPREHENSIVE METABOLIC PANEL
ALBUMIN: 3.4 g/dL — AB (ref 3.5–5.0)
ALT: 14 U/L (ref 14–54)
AST: 14 U/L — AB (ref 15–41)
Alkaline Phosphatase: 52 U/L (ref 38–126)
Anion gap: 10 (ref 5–15)
BILIRUBIN TOTAL: 0.9 mg/dL (ref 0.3–1.2)
BUN: 14 mg/dL (ref 6–20)
CHLORIDE: 107 mmol/L (ref 101–111)
CO2: 25 mmol/L (ref 22–32)
Calcium: 8.8 mg/dL — ABNORMAL LOW (ref 8.9–10.3)
Creatinine, Ser: 1.02 mg/dL — ABNORMAL HIGH (ref 0.44–1.00)
GFR calc Af Amer: >60 mL/min (ref >60)
GFR calc non Af Amer: 54 mL/min — ABNORMAL LOW (ref >60)
GLUCOSE: 121 mg/dL — AB (ref 65–99)
POTASSIUM: 3.9 mmol/L (ref 3.5–5.1)
Sodium: 142 mmol/L (ref 135–145)
TOTAL PROTEIN: 6.1 g/dL — AB (ref 6.5–8.1)

## 2015-04-05 LAB — CBC
HEMATOCRIT: 33.7 % — AB (ref 36.0–46.0)
Hemoglobin: 11.4 g/dL — ABNORMAL LOW (ref 12.0–15.0)
MCH: 30.2 pg (ref 26.0–34.0)
MCHC: 33.8 g/dL (ref 30.0–36.0)
MCV: 89.4 fL (ref 78.0–100.0)
Platelets: 199 10*3/uL (ref 150–400)
RBC: 3.77 MIL/uL — ABNORMAL LOW (ref 3.87–5.11)
RDW: 14.1 % (ref 11.5–15.5)
WBC: 11.1 10*3/uL — AB (ref 4.0–10.5)

## 2015-04-05 MED ORDER — METRONIDAZOLE 500 MG PO TABS: 500.0000 mg | ORAL_TABLET | Freq: Three times a day (TID) | ORAL | Status: DC

## 2015-04-05 MED ORDER — CIPROFLOXACIN HCL 500 MG PO TABS: 500.0000 mg | ORAL_TABLET | Freq: Two times a day (BID) | ORAL | Status: DC

## 2015-04-05 NOTE — Discharge Instructions (Signed)
Abdominal Pain, Adult Many things can cause abdominal pain. Usually, abdominal pain is not caused by a disease and will improve without treatment. It can often be observed and treated at home. Your health care provider will do a physical exam and possibly order blood tests and X-rays to help determine the seriousness of your pain. However, in many cases, more time must pass before a clear cause of the pain can be found. Before that point, your health care provider may not know if you need more testing or further treatment. HOME CARE INSTRUCTIONS Monitor your abdominal pain for any changes. The following actions may help to alleviate any discomfort you are experiencing:  Only take over-the-counter or prescription medicines as directed by your health care provider.  Do not take laxatives unless directed to do so by your health care provider.  Try a clear liquid diet (broth, tea, or water) as directed by your health care provider. Slowly move to a bland diet as tolerated. SEEK MEDICAL CARE IF:  You have unexplained abdominal pain.  You have abdominal pain associated with nausea or diarrhea.  You have pain when you urinate or have a bowel movement.  You experience abdominal pain that wakes you in the night.  You have abdominal pain that is worsened or improved by eating food.  You have abdominal pain that is worsened with eating fatty foods.  You have a fever. SEEK IMMEDIATE MEDICAL CARE IF:  Your pain does not go away within 2 hours.  You keep throwing up (vomiting).  Your pain is felt only in portions of the abdomen, such as the right side or the left lower portion of the abdomen.  You pass bloody or black tarry stools. MAKE SURE YOU:  Understand these instructions.  Will watch your condition.  Will get help right away if you are not doing well or get worse.   This information is not intended to replace advice given to you by your health care provider. Make sure you discuss  any questions you have with your health care provider.   Document Released: 10/22/2004 Document Revised: 10/03/2014 Document Reviewed: 09/21/2012 Elsevier Interactive Patient Education 2016 Else[employer]     Ciprofloxacin tablets  What is this medicine?  CIPROFLOXACIN (sip roe FLOX a sin) is a quinolone antibiotic. It is used to treat certain kinds of bacterial infections. It will not work for colds, flu, or other viral infections.  This medicine may be used for other purposes; ask your health care provider or pharmacist if you have questions.  What should I tell my health care provider before I take this medicine?  They need to know if you have any of these conditions:  -bone problems  -cerebral disease  -history of low levels of potassium in the blood  -joint problems  -irregular heartbeat  -kidney disease  -myasthenia gravis  -seizures  -tendon problems  -tingling of the fingers or toes, or other nerve disorder  -an unusual or allergic reaction to ciprofloxacin, other antibiotics or medicines, foods, dyes, or preservatives  -pregnant or trying to get pregnant  -breast-feeding  How should I use this medicine?  Take this medicine by mouth with a glass of water. Follow the directions on the prescription label. Take your medicine at regular intervals. Do not take your medicine more often than directed. Take all of your medicine as directed even if you think your are better. Do not skip doses or stop your medicine early.  You can take this medicine with food  or on an empty stomach. It can be taken with a meal that contains dairy or calcium, but do not take it alone with a dairy product, like milk or yogurt or calcium-fortified juice.  A special MedGuide will be given to you by the pharmacist with each prescription and refill. Be sure to read this information carefully each time.  Talk to your pediatrician regarding the use of this medicine in children. Special care may be needed.    Overdosage: If you think you have taken too much of this medicine contact a poison control center or emergency room at once.  NOTE: This medicine is only for you. Do not share this medicine with others.  What if I miss a dose?  If you miss a dose, take it as soon as you can. If it is almost time for your next dose, take only that dose. Do not take double or extra doses.  What may interact with this medicine?  Do not take this medicine with any of the following medications:  -cisapride  -droperidol  -terfenadine  -tizanidine  This medicine may also interact with the following medications:  -antacids  -birth control pills  -caffeine  -cyclosporin  -didanosine (ddI) buffered tablets or powder  -medicines for diabetes  -medicines for inflammation like ibuprofen, naproxen  -methotrexate  -multivitamins  -omeprazole  -phenytoin  -probenecid  -sucralfate  -theophylline  -warfarin  This list may not describe all possible interactions. Give your health care provider a list of all the medicines, herbs, non-prescription drugs, or dietary supplements you use. Also tell them if you smoke, drink alcohol, or use illegal drugs. Some items may interact with your medicine.  What should I watch for while using this medicine?  Tell your doctor or health care professional if your symptoms do not improve.  Do not treat diarrhea with over the counter products. Contact your doctor if you have diarrhea that lasts more than 2 days or if it is severe and watery.  You may get drowsy or dizzy. Do not drive, use machinery, or do anything that needs mental alertness until you know how this medicine affects you. Do not stand or sit up quickly, especially if you are an older patient. This reduces the risk of dizzy or fainting spells.  This medicine can make you more sensitive to the sun. Keep out of the sun. If you cannot avoid being in the sun, wear protective clothing and use sunscreen. Do not use sun lamps or  tanning beds/booths.  Avoid antacids, aluminum, calcium, iron, magnesium, and zinc products for 6 hours before and 2 hours after taking a dose of this medicine.  What side effects may I notice from receiving this medicine?  Side effects that you should report to your doctor or health care professional as soon as possible:  -allergic reactions like skin rash or hives, swelling of the face, lips, or tongue  -anxious  -confusion  -depressed mood  -diarrhea  -fast, irregular heartbeat  -hallucination, loss of contact with reality  -joint, muscle, or tendon pain or swelling  -pain, tingling, numbness in the hands or feet  -suicidal thoughts or other mood changes  -sunburn  -unusually weak or tired  Side effects that usually do not require medical attention (report to your doctor or health care professional if they continue or are bothersome):  -dry mouth  -headache  -nausea  -trouble sleeping  This list may not describe all possible side effects. Call your doctor for medical advice about side  effects. You may report side effects to FDA at 1-800-FDA-1088.  Where should I keep my medicine?  Keep out of the reach of children.  Store at room temperature below 30 degrees C (86 degrees F). Keep container tightly closed. Throw away any unused medicine after the expiration date.  NOTE: This sheet is a summary. It may not cover all possible information. If you have questions about this medicine, talk to your doctor, pharmacist, or health care provider.   2016, Elsevier/Gold Standard. (2014-08-23 12:57:02)

## 2015-04-05 NOTE — Consult Note (Signed)
Reason for Consult: Colitis Referring Physician: [facility]  [name] HPI: This is a 75 year old female with a PMH of diverticulitis, HTN, GERD, and hypothyroidism admitted for a left sided colitis.  She sought care from her PCP who sent her to the ER for further evaluation.  Over the past month she felt mild abdominal pain, but on the day of admission her pain markedly intensified.  In fact, her pain intensified the day before admission.  No reports of hematochezia or melena and her last colonoscopy was one year ago with Dr. Collene Mares.  At that time she was noted to have diverticula and a polyp.  In the past she reports a history of diverticulitis and she felt that she was having a recurrence.  The CT scan revealed a long segment of the left side of the colon with inflammation, which could be from an infectious source or inflammatory source.  Cipro and Flagyl were emperically started and she feels better.  Her WBC has dropped down from 16 down to 11 and she desires to go home.  Past Medical History  Diagnosis Date  . Hypertension   . Hyperthyroidism     nodule on thyroid, Radioactive, no hypo  . Arthritis     knees  . Depression   . Diverticulosis     bleeding  . Hypothyroidism, iatrogenic     After RI now hypo on synthroid  . Diverticulitis   . GERD (gastroesophageal reflux disease)     Past Surgical History  Procedure Laterality Date  . Bladder surgery      bladder suspension  . Abdominal hysterectomy  1977  . Total knee revision  12/17/2010    Procedure: TOTAL KNEE REVISION;  Surgeon: Dione Plover Aluisio;  Location: [facility];  Service: Orthopedics;  Laterality: Right;  . Joint replacement Right 2009    total knee replacement  . Rotator cuff repair Left   . Cholecystectomy N/A 03/29/2013    Procedure: LAPAROSCOPIC CHOLECYSTECTOMY WITH INTRAOPERATIVE CHOLANGIOGRAM;  Surgeon: Edward Jolly, MD;  Location: [facility];  Service: General;  Laterality: N/A;    Family History   Problem Relation Age of Onset  . Diverticulitis Mother   . Colon cancer Maternal Grandmother   . Cancer Maternal Grandmother     colon  . Cancer Daughter     breast    Social History:  reports that she has never smoked. She has never used smokeless tobacco. She reports that she does not drink alcohol or use illicit drugs.  Allergies:  Allergies  Allergen Reactions  . Amitriptyline Other (See Comments)    Intolerance, hallucinations  . Aspirin     Bleeding.   . Demerol Other (See Comments)    migraines  . Demerol [Meperidine]   . Fish Allergy Nausea Only  . Nsaids     Lower GI bleeding  . Morphine And Related Rash    Medications:  Scheduled: . sodium chloride   Intravenous STAT  . benazepril  20 mg Oral Daily   And  . hydrochlorothiazide  12.5 mg Oral Daily  . ciprofloxacin  400 mg Intravenous Q12H  . enoxaparin (LOVENOX) injection  30 mg Subcutaneous Q24H  . levothyroxine  75 mcg Oral QAC breakfast  . metronidazole  500 mg Intravenous Q8H   Continuous: . sodium chloride      Results for orders placed or performed during the hospital encounter of 04/04/15 (from the past 24 hour(s))  Lipase, blood     Status:  None   Collection Time: 04/04/15 12:03 PM  Result Value Ref Range   Lipase 22 11 - 51 U/L  Comprehensive metabolic panel     Status: Abnormal   Collection Time: 04/04/15 12:03 PM  Result Value Ref Range   Sodium 137 135 - 145 mmol/L   Potassium 3.5 3.5 - 5.1 mmol/L   Chloride 101 101 - 111 mmol/L   CO2 23 22 - 32 mmol/L   Glucose, Bld 107 (H) 65 - 99 mg/dL   BUN 21 (H) 6 - 20 mg/dL   Creatinine, Ser 1.19 (H) 0.44 - 1.00 mg/dL   Calcium 9.5 8.9 - 10.3 mg/dL   Total Protein 7.5 6.5 - 8.1 g/dL   Albumin 4.4 3.5 - 5.0 g/dL   AST 22 15 - 41 U/L   ALT 18 14 - 54 U/L   Alkaline Phosphatase 70 38 - 126 U/L   Total Bilirubin 0.8 0.3 - 1.2 mg/dL   GFR calc non Af Amer 44 (L) >60 mL/min   GFR calc Af Amer 52 (L) >60 mL/min   Anion gap 13 5 - 15  CBC      Status: Abnormal   Collection Time: 04/04/15 12:03 PM  Result Value Ref Range   WBC 16.2 (H) 4.0 - 10.5 K/uL   RBC 4.52 3.87 - 5.11 MIL/uL   Hemoglobin 13.8 12.0 - 15.0 g/dL   HCT 40.0 36.0 - 46.0 %   MCV 88.5 78.0 - 100.0 fL   MCH 30.5 26.0 - 34.0 pg   MCHC 34.5 30.0 - 36.0 g/dL   RDW 14.1 11.5 - 15.5 %   Platelets 271 150 - 400 K/uL  Urinalysis, Routine w reflex microscopic (not at [facility])     Status: None   Collection Time: 04/04/15  1:47 PM  Result Value Ref Range   Color, Urine YELLOW YELLOW   APPearance CLEAR CLEAR   Specific Gravity, Urine 1.019 1.005 - 1.030   pH 6.0 5.0 - 8.0   Glucose, UA NEGATIVE NEGATIVE mg/dL   Hgb urine dipstick NEGATIVE NEGATIVE   Bilirubin Urine NEGATIVE NEGATIVE   Ketones, ur NEGATIVE NEGATIVE mg/dL   Protein, ur NEGATIVE NEGATIVE mg/dL   Nitrite NEGATIVE NEGATIVE   Leukocytes, UA NEGATIVE NEGATIVE  CBC     Status: Abnormal   Collection Time: 04/05/15  5:00 AM  Result Value Ref Range   WBC 11.1 (H) 4.0 - 10.5 K/uL   RBC 3.77 (L) 3.87 - 5.11 MIL/uL   Hemoglobin 11.4 (L) 12.0 - 15.0 g/dL   HCT 33.7 (L) 36.0 - 46.0 %   MCV 89.4 78.0 - 100.0 fL   MCH 30.2 26.0 - 34.0 pg   MCHC 33.8 30.0 - 36.0 g/dL   RDW 14.1 11.5 - 15.5 %   Platelets 199 150 - 400 K/uL  Comprehensive metabolic panel     Status: Abnormal   Collection Time: 04/05/15  5:00 AM  Result Value Ref Range   Sodium 142 135 - 145 mmol/L   Potassium 3.9 3.5 - 5.1 mmol/L   Chloride 107 101 - 111 mmol/L   CO2 25 22 - 32 mmol/L   Glucose, Bld 121 (H) 65 - 99 mg/dL   BUN 14 6 - 20 mg/dL   Creatinine, Ser 1.02 (H) 0.44 - 1.00 mg/dL   Calcium 8.8 (L) 8.9 - 10.3 mg/dL   Total Protein 6.1 (L) 6.5 - 8.1 g/dL   Albumin 3.4 (L) 3.5 - 5.0 g/dL   AST 14 (L) 15 -  41 U/L   ALT 14 14 - 54 U/L   Alkaline Phosphatase 52 38 - 126 U/L   Total Bilirubin 0.9 0.3 - 1.2 mg/dL   GFR calc non Af Amer 54 (L) >60 mL/min   GFR calc Af Amer >60 >60 mL/min   Anion gap 10 5 - 15     Ct Abdomen Pelvis W  Contrast  04/04/2015  CLINICAL DATA:  History of diverticulitis, GERD. Presents with nausea, vomiting, diarrhea, hematochezia and diffuse abdominal pain beginning last night. Crampy, burning abdominal pain after eating for the past month. EXAM: CT ABDOMEN AND PELVIS WITH CONTRAST TECHNIQUE: Multidetector CT imaging of the abdomen and pelvis was performed using the standard protocol following bolus administration of intravenous contrast. CONTRAST:  80 cc omni 300 COMPARISON:  None. FINDINGS: Lower chest:  No acute findings. Hepatobiliary: Status post cholecystectomy with associated mild bile duct ectasia. Liver appears normal. Pancreas: No mass, inflammatory changes, or other significant abnormality. Spleen: Within normal limits in size and appearance. Adrenals/Urinary Tract: Adrenal glands appear normal. Stable left renal cyst. Multiple nonobstructing renal stones again noted bilaterally, largest on the left measuring up to 6 mm. No hydronephrosis bilaterally. No ureteral or bladder calculi identified. Stomach/Bowel: Thickening of the walls of a fairly long segment of the left colon, extending from the distal transverse colon to the distal descending colon, with associated pericolic fluid/ inflammation, consistent with colitis. No pericolic abscess collection. Scattered diverticulosis noted within the sigmoid colon without evidence of acute diverticulitis. No large or small bowel dilatation. Appendix is not seen but there are no inflammatory changes about the cecum to suggest acute appendicitis. Vascular/Lymphatic: No pathologically enlarged lymph nodes. No evidence of abdominal aortic aneurysm. Reproductive: No mass or other significant abnormality. Other: No free fluid or abscess collections seen. No free intraperitoneal air. Musculoskeletal: Degenerative changes throughout the thoracolumbar spine, mild to moderate in degree. No acute or suspicious osseous lesions seen. Superficial soft tissues are unremarkable.  IMPRESSION: 1. Acute colitis involving a fairly long segment of the left colon, involving the distal transverse colon to the distal descending colon, with associated bowel wall thickening and paracolic inflammation. Differential would include infectious, inflammatory and ischemic etiologies. Favor infectious or inflammatory colitis. No paracolic abscess collection. No free intraperitoneal air. No associated bowel obstruction. 2. Sigmoid diverticulosis without evidence of acute diverticulitis. Bilateral nephrolithiasis. Additional chronic/incidental findings detailed above. Electronically Signed   By: Franki Cabot M.D.   On: 04/04/2015 16:44    ROS:  As stated above in the HPI otherwise negative.  Blood pressure 125/63, pulse 83, temperature 98.5 F (36.9 C), temperature source Oral, resp. rate 16, height 5\' 2"  (1.575 m), weight 71.668 kg (158 lb), SpO2 98 %.    PE: Gen: NAD, Alert and Oriented HEENT:  [place]/AT, EOMI Neck: Supple, no LAD Lungs: CTA Bilaterally CV: RRR without M/G/R ABM: Soft, NTND, +BS Ext: No C/C/E  Assessment/Plan: 1) Left sided colitis. 2) ABM pain. 3) History of colitis.   I reviewed her colonoscopy record from the office.  She had a colonoscopy in 03/05/2014 and a small hyperplastic polyp was identified as well as a sigmoid diverticula.  In 2009 she had a colitis, but NOT diverticulitis.  I cannot identify any history supporting diverticulitis in the hospital records.  She does not have any pain at this time.  Plan: 1) Agree with treating with Cipro and Flagyl x 10 days. 2) Okay to D/C home and follow up with Dr. Collene Mares in two weeks.  Willard Madrigal D 04/05/2015, 9:55 AM

## 2015-04-05 NOTE — Discharge Summary (Signed)
Physician Discharge Summary  EYMI LIPUMA UVO:[id number] DOB: 05-Aug-1940 DOA: 04/04/2015  PCP: Kelton Pillar, MD  Admit date: 04/04/2015 Discharge date: 04/05/2015  Time spent: 45 minutes  Recommendations for Outpatient Follow-up:  Patient will be discharged to home.  Patient will need to follow up with primary care provider within one week of discharge, CBC and BMP.  Follow up with Dr. Collene Mares, gastroenterology, in 2 weeks. Patient should continue medications as prescribed.  Patient should follow a soft diet and advance to heart healthy diet.   Discharge Diagnoses:  Acute colitis/hematochezia Leukocytosis Essential hypertension Hypothyroidism Chronic kidney disease, stage III  Discharge Condition: Stable   Diet recommendation: heart healthy  Filed Weights   04/04/15 2209  Weight: 71.668 kg (158 lb)    History of present illness:  On 04/04/2015  [name] is a 75 y.o. female with a history of diverticulosis and diverticulitis, GERD, hypothyroidism, hypertension that presented to the emergency department with complains of abdominal pain and questionable diverticulitis. Patient states over the past month she's been having some mild abdominal pain however last night she began having a men's abdominal pain all over her abdomen. States it felt like her diverticulitis in the past. Patient was able to have some dinner last night however that was followed by vomiting as well as diarrhea. She did notice that she had bright red blood per rectum. Patient denies any bloody emesis. Patient to present to her primary care physician's office today who sent her to emergency department to obtain CT of the abdomen and pelvis for concern of recurrent diverticulitis. At this time, patient denies any chest pain, shortness of breath, dizziness or headache. She did state that she sees Dr. Collene Mares, gastroenterologist. Patient did have a colonoscopy approximately 1 year ago which showed 1 polyp, she will return  for repeat in 5 years. In the emergency department, CT abdomen and pelvis did show acute colitis. [facility] called for admission.  Hospital Course:  Acute colitis/ hematochezia -CT abd/pelvis: Acute colitis involving a fairly long segment of the left colon involving the distal transverse colon to the distal descending colon associated with thickening and pericolic inflammation. Sigmoid diverticulosis without evidence of acute diverticulitis. -Pain control, Continue cipro/flagyl -Patient did have one episode of blood in her stool last night. hemoglobin 11.4 (drop likely dilutional from IVF) -Patient had a colonoscopy 02/2014, with Dr. Collene Mares -Gastroenterology consultation appreciated. Recommended continuing Cipro and Flagyl for 10 days, follow-up with Dr. Collene Mares in 2 weeks. -Patient is placed on full liquid diet and able to tolerate. Discussed advancing to soft diet as tolerated.  Leukocytosis -Likely secondary to the above -Trending downward  Essential hypertension -Continue Lotensin  Hypothyroidism -Continue Synthroid  Chronic kidney disease, stage III -Cr 1.02, currently Stable  Procedures:  None  Consultations:  Gastroenterology  Discharge Exam: Filed Vitals:   04/05/15 0618 04/05/15 1001  BP: 125/63 118/60  Pulse: 83 75  Temp: 98.5 F (36.9 C)   Resp: 16      General: Well developed, well nourished, NAD, appears stated age  HEENT: NCAT,mucous membranes moist.  Cardiovascular: S1 S2 auscultated, RRR, no murmurs  Respiratory: Clear to auscultation bilaterally   Abdomen: Soft, obese, nontender, nondistended, + bowel sounds  Extremities: warm dry without cyanosis clubbing or edema  Neuro: AAOx3, nonfocal  Psych: Normal affect and demeanor   Discharge Instructions      Discharge Instructions    Discharge instructions    Complete by:  As directed   Patient will be discharged to  home.  Patient will need to follow up with primary care provider within one week of  discharge.  Follow up with Dr. Collene Mares, gastroenterology. Patient should continue medications as prescribed.  Patient should follow a soft diet and advance to heart healthy diet.            Medication List    STOP taking these medications        HYDROcodone-acetaminophen 5-325 MG tablet  Commonly known as:  NORCO/VICODIN     methocarbamol 500 MG tablet  Commonly known as:  ROBAXIN      TAKE these medications        acetaminophen 325 MG tablet  Commonly known as:  TYLENOL  Take 650 mg by mouth every 6 (six) hours as needed for mild pain.     benazepril-hydrochlorthiazide 20-12.5 MG tablet  Commonly known as:  LOTENSIN HCT  Take 1 tablet by mouth daily.     ciprofloxacin 500 MG tablet  Commonly known as:  CIPRO  Take 1 tablet (500 mg total) by mouth 2 (two) times daily.     HAIR/SKIN/NAILS PO  Take 1 tablet by mouth daily.     levothyroxine 75 MCG tablet  Commonly known as:  SYNTHROID, LEVOTHROID  Take 75 mcg by mouth daily.     metroNIDAZOLE 500 MG tablet  Commonly known as:  FLAGYL  Take 1 tablet (500 mg total) by mouth 3 (three) times daily.     vitamin B-12 100 MCG tablet  Commonly known as:  CYANOCOBALAMIN  daily.     Vitamin D3 5000 units Caps  Take by mouth daily.       Allergies  Allergen Reactions  . Amitriptyline Other (See Comments)    Intolerance, hallucinations  . Aspirin     Bleeding.   . Demerol Other (See Comments)    migraines  . Demerol [Meperidine]   . Fish Allergy Nausea Only  . Nsaids     Lower GI bleeding  . Morphine And Related Rash   Follow-up Information    Follow up with Kelton Pillar, MD. Schedule an appointment as soon as possible for a visit in 1 week.   Specialty:  Internal Medicine   Why:  Hospital follow up   Contact information:   [address] [place] [address] [phone]       Follow up with Juanita Craver, MD. Schedule an appointment as soon as possible for a visit in 2 weeks.   Specialty:   Gastroenterology   Why:  Hospital follow up, colitis   Contact information:   [address] [place] [address] [phone]        The results of significant diagnostics from this hospitalization (including imaging, microbiology, ancillary and laboratory) are listed below for reference.    Significant Diagnostic Studies: Ct Abdomen Pelvis W Contrast  04/04/2015  CLINICAL DATA:  History of diverticulitis, GERD. Presents with nausea, vomiting, diarrhea, hematochezia and diffuse abdominal pain beginning last night. Crampy, burning abdominal pain after eating for the past month. EXAM: CT ABDOMEN AND PELVIS WITH CONTRAST TECHNIQUE: Multidetector CT imaging of the abdomen and pelvis was performed using the standard protocol following bolus administration of intravenous contrast. CONTRAST:  80 cc omni 300 COMPARISON:  None. FINDINGS: Lower chest:  No acute findings. Hepatobiliary: Status post cholecystectomy with associated mild bile duct ectasia. Liver appears normal. Pancreas: No mass, inflammatory changes, or other significant abnormality. Spleen: Within normal limits in size and appearance. Adrenals/Urinary Tract: Adrenal  glands appear normal. Stable left renal cyst. Multiple nonobstructing renal stones again noted bilaterally, largest on the left measuring up to 6 mm. No hydronephrosis bilaterally. No ureteral or bladder calculi identified. Stomach/Bowel: Thickening of the walls of a fairly long segment of the left colon, extending from the distal transverse colon to the distal descending colon, with associated pericolic fluid/ inflammation, consistent with colitis. No pericolic abscess collection. Scattered diverticulosis noted within the sigmoid colon without evidence of acute diverticulitis. No large or small bowel dilatation. Appendix is not seen but there are no inflammatory changes about the cecum to suggest acute appendicitis. Vascular/Lymphatic: No pathologically enlarged lymph  nodes. No evidence of abdominal aortic aneurysm. Reproductive: No mass or other significant abnormality. Other: No free fluid or abscess collections seen. No free intraperitoneal air. Musculoskeletal: Degenerative changes throughout the thoracolumbar spine, mild to moderate in degree. No acute or suspicious osseous lesions seen. Superficial soft tissues are unremarkable. IMPRESSION: 1. Acute colitis involving a fairly long segment of the left colon, involving the distal transverse colon to the distal descending colon, with associated bowel wall thickening and paracolic inflammation. Differential would include infectious, inflammatory and ischemic etiologies. Favor infectious or inflammatory colitis. No paracolic abscess collection. No free intraperitoneal air. No associated bowel obstruction. 2. Sigmoid diverticulosis without evidence of acute diverticulitis. Bilateral nephrolithiasis. Additional chronic/incidental findings detailed above. Electronically Signed   By: Franki Cabot M.D.   On: 04/04/2015 16:44   Mm Digital Diagnostic Unilat R  03/25/2015  CLINICAL DATA:  Status post ultrasound-guided biopsy earlier today. EXAM: DIAGNOSTIC RIGHT MAMMOGRAM POST ULTRASOUND BIOPSY COMPARISON:  Previous exam(s). FINDINGS: Mammographic images were obtained following ultrasound guided biopsy of the shadowing hypoechoic area within the right breast at the 10 o'clock axis, 8 cm from the nipple, using a lateral approach. At the conclusion of the procedure, a ribbon shaped tissue marker was placed at the biopsy site. This biopsy clip appears well positioned, ultrasound better showing its position within the shadowing hypoechoic area corresponding to the site of patient's focal breast pain. IMPRESSION: Postprocedure mammogram for clip placement. Biopsy clip well-positioned. Final Assessment: Post Procedure Mammograms for Marker Placement Electronically Signed   By: Franki Cabot M.D.   On: 03/25/2015 11:04   US Breast Ltd  Uni Right Inc Axilla  03/13/2015  CLINICAL DATA:  Focal right breast pain at the 9 o'clock axis. EXAM: DIGITAL DIAGNOSTIC BILATERAL MAMMOGRAM WITH 3D TOMOSYNTHESIS WITH CAD ULTRASOUND RIGHT BREAST COMPARISON:  Previous exam(s). ACR Breast Density Category c: The breast tissue is heterogeneously dense, which may obscure small masses. FINDINGS: Bilateral CC and MLO projections were obtained today with 3D tomosynthesis. Additional spot compression tangential views of the upper right breast were obtained for the area of clinical concern, with overlying skin marker in place. There are no dominant masses, suspicious calcifications or secondary signs of malignancy identified in either breast. Specifically, no mammographic abnormality identified within the upper-outer quadrant of the right breast, corresponding to the area of clinical concern with overlying skin marker in place. Mammographic images were processed with CAD. On physical exam, there is no palpable mass. Surgical scar noted within the skin at the 9 o'clock axis. Patient describes the right breast pain at the 9-10 o'clock axis of the right breast, most recently at the 10 o'clock axis. Targeted ultrasound is performed, evaluating the 9-10 o'clock axis of the right breast, showing a vague be shadowing hypoechoic area within the right breast at the 10 o'clock axis, 8 cm from the nipple, measuring 0.9 x  0.8 x 0.4 cm, corresponding to the site of patient's new focal breast pain. At the site of patient's chronic right breast pain, 9:30 o'clock, 8 cm from nipple, directly under the skin scar, is an additional area of shadowing most compatible with chronic scar. No suspicious findings by ultrasound at the 9 o'clock to 9:30 o'clock axis. IMPRESSION: Vague shadowing hypoechoic area within the right breast at the 10 o'clock axis, 8 cm from the nipple, measuring 0.9 x 0.8 x 0.4 cm, corresponding to the site of patient's new focal breast pain. This may represent scar tissue  related to patient's previous benign excisional biopsy but this area is not contiguous with the scarring at the 9 o'clock to 9:30 o'clock axis and is slightly higher within the upper-outer quadrant when compared to the overlying skin scar at the 9 o'clock axis. As such, ultrasound-guided biopsy is recommended. RECOMMENDATION: Ultrasound-guided biopsy for the vague shadowing hypoechoic area within the right breast at the 10 o'clock axis, 8 cm from the nipple, measuring 0.9 x 0.8 x 0.4 cm, corresponding to the site of patient's new focal breast pain. Ultrasound-guided biopsy is scheduled for February 27th at 10 a.m. I have discussed the findings and recommendations with the patient. Results were also provided in writing at the conclusion of the visit. If applicable, a reminder letter will be sent to the patient regarding the next appointment. BI-RADS CATEGORY  4: Suspicious. Electronically Signed   By: Franki Cabot M.D.   On: 03/13/2015 16:48   Mm Diag Breast Tomo Bilateral  03/13/2015  CLINICAL DATA:  Focal right breast pain at the 9 o'clock axis. EXAM: DIGITAL DIAGNOSTIC BILATERAL MAMMOGRAM WITH 3D TOMOSYNTHESIS WITH CAD ULTRASOUND RIGHT BREAST COMPARISON:  Previous exam(s). ACR Breast Density Category c: The breast tissue is heterogeneously dense, which may obscure small masses. FINDINGS: Bilateral CC and MLO projections were obtained today with 3D tomosynthesis. Additional spot compression tangential views of the upper right breast were obtained for the area of clinical concern, with overlying skin marker in place. There are no dominant masses, suspicious calcifications or secondary signs of malignancy identified in either breast. Specifically, no mammographic abnormality identified within the upper-outer quadrant of the right breast, corresponding to the area of clinical concern with overlying skin marker in place. Mammographic images were processed with CAD. On physical exam, there is no palpable mass.  Surgical scar noted within the skin at the 9 o'clock axis. Patient describes the right breast pain at the 9-10 o'clock axis of the right breast, most recently at the 10 o'clock axis. Targeted ultrasound is performed, evaluating the 9-10 o'clock axis of the right breast, showing a vague be shadowing hypoechoic area within the right breast at the 10 o'clock axis, 8 cm from the nipple, measuring 0.9 x 0.8 x 0.4 cm, corresponding to the site of patient's new focal breast pain. At the site of patient's chronic right breast pain, 9:30 o'clock, 8 cm from nipple, directly under the skin scar, is an additional area of shadowing most compatible with chronic scar. No suspicious findings by ultrasound at the 9 o'clock to 9:30 o'clock axis. IMPRESSION: Vague shadowing hypoechoic area within the right breast at the 10 o'clock axis, 8 cm from the nipple, measuring 0.9 x 0.8 x 0.4 cm, corresponding to the site of patient's new focal breast pain. This may represent scar tissue related to patient's previous benign excisional biopsy but this area is not contiguous with the scarring at the 9 o'clock to 9:30 o'clock axis and is slightly  higher within the upper-outer quadrant when compared to the overlying skin scar at the 9 o'clock axis. As such, ultrasound-guided biopsy is recommended. RECOMMENDATION: Ultrasound-guided biopsy for the vague shadowing hypoechoic area within the right breast at the 10 o'clock axis, 8 cm from the nipple, measuring 0.9 x 0.8 x 0.4 cm, corresponding to the site of patient's new focal breast pain. Ultrasound-guided biopsy is scheduled for February 27th at 10 a.m. I have discussed the findings and recommendations with the patient. Results were also provided in writing at the conclusion of the visit. If applicable, a reminder letter will be sent to the patient regarding the next appointment. BI-RADS CATEGORY  4: Suspicious. Electronically Signed   By: Franki Cabot M.D.   On: 03/13/2015 16:48   [place] Rt Breast  Bx W Loc Dev 1st Lesion Img Bx Spec US Guide  03/27/2015  ADDENDUM REPORT: 03/27/2015 10:51 ADDENDUM: Pathology revealed benign fibrofatty and vascular breast tissue in the right breast. This was found to be concordant by Dr. Franki Cabot. Pathology results were discussed with the patient by telephone. The patient reported doing well after the biopsy with tenderness at the site. Post biopsy instructions and care were reviewed and questions were answered. The patient was encouraged to call [facility] for any additional concerns. The patient was instructed to return for right diagnostic mammography and possible ultrasound in 6 months and informed that a reminder letter would be sent regarding this appointment. Pathology results reported by Susa Raring RN, BSN on 03/27/2015. Electronically Signed   By: Franki Cabot M.D.   On: 03/27/2015 10:51  03/25/2015  ADDENDUM REPORT: 03/25/2015 11:50 ADDENDUM: Right axilla was also evaluated with ultrasound today. There are no enlarged or morphologically abnormal lymph nodes identified. Electronically Signed   By: Franki Cabot M.D.   On: 03/25/2015 11:50  03/27/2015  CLINICAL DATA:  Recent diagnostic ultrasound revealing a shadowing hypoechoic area in the right breast at the 10 o'clock axis, 8 cm from the nipple, corresponding to a site of focal breast pain. Patient presents today for ultrasound-guided biopsy of this shadowing hypoechoic area. Patient with a previous benign excisional biopsy at the 9 o'clock axis of the right breast, with associated scar appreciated at the 9 o'clock axis on earlier diagnostic ultrasound. EXAM: ULTRASOUND GUIDED RIGHT BREAST CORE NEEDLE BIOPSY COMPARISON:  Previous exam(s). PROCEDURE: I met with the patient and we discussed the procedure of ultrasound-guided biopsy, including benefits and alternatives. We discussed the high likelihood of a successful procedure. We discussed the risks of the procedure including  infection, bleeding, tissue injury, clip migration, and inadequate sampling. Informed written consent was given. The usual time-out protocol was performed immediately prior to the procedure. Using sterile technique and 1% Lidocaine as local anesthetic, under direct ultrasound visualization, a 12 gauge spring-loaded device was used to perform biopsy of the shadowing hypoechoic area within the right breast at the 10 o'clock axis, 8 cm from the nipple,using a lateral approach. At the conclusion of the procedure, a ribbon shaped tissue marker clip was deployed into the biopsy cavity. Follow-up 2-view mammogram was performed and dictated separately. IMPRESSION: Ultrasound-guided biopsy of the shadowing hypoechoic area in the right breast at the 10 o'clock axis, 8 cm from the nipple, corresponding to a site of focal breast pain. No apparent complications. Electronically Signed: By: Franki Cabot M.D. On: 03/25/2015 10:59    Microbiology: No results found for this or any previous visit (from the past 240 hour(s)).  Labs: Basic Metabolic Panel:  Recent Labs Lab 04/04/15 1203 04/05/15 0500  NA 137 142  K 3.5 3.9  CL 101 107  CO2 23 25  GLUCOSE 107* 121*  BUN 21* 14  CREATININE 1.19* 1.02*  CALCIUM 9.5 8.8*   Liver Function Tests:  Recent Labs Lab 04/04/15 1203 04/05/15 0500  AST 22 14*  ALT 18 14  ALKPHOS 70 52  BILITOT 0.8 0.9  PROT 7.5 6.1*  ALBUMIN 4.4 3.4*    Recent Labs Lab 04/04/15 1203  LIPASE 22   No results for input(s): AMMONIA in the last 168 hours. CBC:  Recent Labs Lab 04/04/15 1203 04/05/15 0500  WBC 16.2* 11.1*  HGB 13.8 11.4*  HCT 40.0 33.7*  MCV 88.5 89.4  PLT 271 199   Cardiac Enzymes: No results for input(s): CKTOTAL, CKMB, CKMBINDEX, TROPONINI in the last 168 hours. BNP: BNP (last 3 results) No results for input(s): BNP in the last 8760 hours.  ProBNP (last 3 results) No results for input(s): PROBNP in the last 8760 hours.  CBG: No results  for input(s): GLUCAP in the last 168 hours.     SignedCristal Ford  [facility] 04/05/2015, 11:38 AM

## 2015-04-05 NOTE — Progress Notes (Signed)
Patient given discharge instructions, and verbalized an understanding of all discharge instructions.  Patient agrees with discharge plan, and is being discharged in stable medical condition.  Patient given transportation via wheelchair. 

## 2015-04-11 DIAGNOSIS — M545 Low back pain: Secondary | ICD-10-CM | POA: Diagnosis not present

## 2015-04-17 DIAGNOSIS — R197 Diarrhea, unspecified: Secondary | ICD-10-CM | POA: Diagnosis not present

## 2015-04-17 DIAGNOSIS — K529 Noninfective gastroenteritis and colitis, unspecified: Secondary | ICD-10-CM | POA: Diagnosis not present

## 2015-04-17 DIAGNOSIS — R5383 Other fatigue: Secondary | ICD-10-CM | POA: Diagnosis not present

## 2015-04-17 DIAGNOSIS — R110 Nausea: Secondary | ICD-10-CM | POA: Diagnosis not present

## 2015-04-30 DIAGNOSIS — K5730 Diverticulosis of large intestine without perforation or abscess without bleeding: Secondary | ICD-10-CM | POA: Diagnosis not present

## 2015-04-30 DIAGNOSIS — R933 Abnormal findings on diagnostic imaging of other parts of digestive tract: Secondary | ICD-10-CM | POA: Diagnosis not present

## 2015-04-30 DIAGNOSIS — Z800 Family history of malignant neoplasm of digestive organs: Secondary | ICD-10-CM | POA: Diagnosis not present

## 2015-05-15 DIAGNOSIS — L820 Inflamed seborrheic keratosis: Secondary | ICD-10-CM | POA: Diagnosis not present

## 2015-05-15 DIAGNOSIS — L821 Other seborrheic keratosis: Secondary | ICD-10-CM | POA: Diagnosis not present

## 2015-05-15 DIAGNOSIS — D2272 Melanocytic nevi of left lower limb, including hip: Secondary | ICD-10-CM | POA: Diagnosis not present

## 2015-05-15 DIAGNOSIS — Z808 Family history of malignant neoplasm of other organs or systems: Secondary | ICD-10-CM | POA: Diagnosis not present

## 2015-05-28 DIAGNOSIS — M5136 Other intervertebral disc degeneration, lumbar region: Secondary | ICD-10-CM | POA: Diagnosis not present

## 2015-06-11 DIAGNOSIS — H01021 Squamous blepharitis right upper eyelid: Secondary | ICD-10-CM | POA: Diagnosis not present

## 2015-06-11 DIAGNOSIS — H259 Unspecified age-related cataract: Secondary | ICD-10-CM | POA: Diagnosis not present

## 2015-06-11 DIAGNOSIS — H04123 Dry eye syndrome of bilateral lacrimal glands: Secondary | ICD-10-CM | POA: Diagnosis not present

## 2015-06-14 DIAGNOSIS — M545 Low back pain: Secondary | ICD-10-CM | POA: Diagnosis not present

## 2015-06-21 ENCOUNTER — Other Ambulatory Visit: Payer: Self-pay | Admitting: Orthopedic Surgery

## 2015-06-21 DIAGNOSIS — M545 Low back pain, unspecified: Secondary | ICD-10-CM

## 2015-07-02 ENCOUNTER — Ambulatory Visit
Admission: RE | Admit: 2015-07-02 | Discharge: 2015-07-02 | Disposition: A | Discharge status: Home / Self Care | Payer: PPO | Source: Ambulatory Visit | Attending: Orthopedic Surgery | Admitting: Orthopedic Surgery

## 2015-07-02 DIAGNOSIS — M545 Low back pain, unspecified: Secondary | ICD-10-CM

## 2015-07-02 DIAGNOSIS — M4806 Spinal stenosis, lumbar region: Secondary | ICD-10-CM | POA: Diagnosis not present

## 2015-07-02 MED ORDER — IOPAMIDOL (ISOVUE-M 200) INJECTION 41%
1.0000 mL | Freq: Once | INTRAMUSCULAR | Status: AC
  Administered 2015-07-02: 1 mL via EPIDURAL

## 2015-07-02 MED ORDER — METHYLPREDNISOLONE ACETATE 40 MG/ML INJ SUSP (RADIOLOG
120.0000 mg | Freq: Once | INTRAMUSCULAR | Status: AC
  Administered 2015-07-02: 120 mg via EPIDURAL

## 2015-07-02 NOTE — Discharge Instructions (Signed)

## 2015-08-10 ENCOUNTER — Emergency Department (HOSPITAL_BASED_OUTPATIENT_CLINIC_OR_DEPARTMENT_OTHER)
Admission: EM | Admit: 2015-08-10 | Discharge: 2015-08-10 | Disposition: A | Discharge status: Home / Self Care | Payer: PPO | Attending: Emergency Medicine | Admitting: Emergency Medicine

## 2015-08-10 ENCOUNTER — Emergency Department (HOSPITAL_BASED_OUTPATIENT_CLINIC_OR_DEPARTMENT_OTHER): Payer: PPO

## 2015-08-10 ENCOUNTER — Encounter (HOSPITAL_BASED_OUTPATIENT_CLINIC_OR_DEPARTMENT_OTHER): Payer: Self-pay | Admitting: *Deleted

## 2015-08-10 DIAGNOSIS — Z79899 Other long term (current) drug therapy: Secondary | ICD-10-CM | POA: Diagnosis not present

## 2015-08-10 DIAGNOSIS — J069 Acute upper respiratory infection, unspecified: Secondary | ICD-10-CM | POA: Diagnosis not present

## 2015-08-10 DIAGNOSIS — I10 Essential (primary) hypertension: Secondary | ICD-10-CM | POA: Insufficient documentation

## 2015-08-10 DIAGNOSIS — R05 Cough: Secondary | ICD-10-CM | POA: Diagnosis not present

## 2015-08-10 DIAGNOSIS — F329 Major depressive disorder, single episode, unspecified: Secondary | ICD-10-CM | POA: Insufficient documentation

## 2015-08-10 LAB — CBC WITH DIFFERENTIAL/PLATELET
BASOS ABS: 0 10*3/uL (ref 0.0–0.1)
Basophils Relative: 0 %
Eosinophils Absolute: 0.2 10*3/uL (ref 0.0–0.7)
Eosinophils Relative: 3 %
HCT: 39.1 % (ref 36.0–46.0)
Hemoglobin: 13.4 g/dL (ref 12.0–15.0)
LYMPHS PCT: 30 %
Lymphs Abs: 2.7 10*3/uL (ref 0.7–4.0)
MCH: 30.7 pg (ref 26.0–34.0)
MCHC: 34.3 g/dL (ref 30.0–36.0)
MCV: 89.7 fL (ref 78.0–100.0)
MONO ABS: 0.5 10*3/uL (ref 0.1–1.0)
MONOS PCT: 5 %
NEUTROS ABS: 5.6 10*3/uL (ref 1.7–7.7)
Neutrophils Relative %: 62 %
PLATELETS: 238 10*3/uL (ref 150–400)
RBC: 4.36 MIL/uL (ref 3.87–5.11)
RDW: 13.3 % (ref 11.5–15.5)
WBC: 9 10*3/uL (ref 4.0–10.5)

## 2015-08-10 LAB — BASIC METABOLIC PANEL
Anion gap: 9 (ref 5–15)
BUN: 16 mg/dL (ref 6–20)
CALCIUM: 10 mg/dL (ref 8.9–10.3)
CO2: 30 mmol/L (ref 22–32)
CREATININE: 0.99 mg/dL (ref 0.44–1.00)
Chloride: 99 mmol/L — ABNORMAL LOW (ref 101–111)
GFR calc Af Amer: >60 mL/min (ref >60)
GFR, EST NON AFRICAN AMERICAN: 56 mL/min — AB (ref >60)
Glucose, Bld: 138 mg/dL — ABNORMAL HIGH (ref 65–99)
Potassium: 3.5 mmol/L (ref 3.5–5.1)
SODIUM: 138 mmol/L (ref 135–145)

## 2015-08-10 MED ORDER — AZITHROMYCIN 250 MG PO TABS: 250.0000 mg | ORAL_TABLET | Freq: Every day | ORAL | Status: DC

## 2015-08-10 NOTE — Discharge Instructions (Signed)
Upper Respiratory Infection, Adult Most upper respiratory infections (URIs) are a viral infection of the air passages leading to the lungs. A URI affects the nose, throat, and upper air passages. The most common type of URI is nasopharyngitis and is typically referred to as "the common cold." URIs run their course and usually go away on their own. Most of the time, a URI does not require medical attention, but sometimes a bacterial infection in the upper airways can follow a viral infection. This is called a secondary infection. Sinus and middle ear infections are common types of secondary upper respiratory infections. Bacterial pneumonia can also complicate a URI. A URI can worsen asthma and chronic obstructive pulmonary disease (COPD). Sometimes, these complications can require emergency medical care and may be life threatening.  CAUSES Almost all URIs are caused by viruses. A virus is a type of germ and can spread from one person to another.  RISKS FACTORS You may be at risk for a URI if:   You smoke.   You have chronic heart or lung disease.  You have a weakened defense (immune) system.   You are very young or very old.   You have nasal allergies or asthma.  You work in crowded or poorly ventilated areas.  You work in health care facilities or schools. SIGNS AND SYMPTOMS  Symptoms typically develop 2-3 days after you come in contact with a cold virus. Most viral URIs last 7-10 days. However, viral URIs from the influenza virus (flu virus) can last 14-18 days and are typically more severe. Symptoms may include:   Runny or stuffy (congested) nose.   Sneezing.   Cough.   Sore throat.   Headache.   Fatigue.   Fever.   Loss of appetite.   Pain in your forehead, behind your eyes, and over your cheekbones (sinus pain).  Muscle aches.  DIAGNOSIS  Your health care provider may diagnose a URI by:  Physical exam.  Tests to check that your symptoms are not due to  another condition such as:  Strep throat.  Sinusitis.  Pneumonia.  Asthma. TREATMENT  A URI goes away on its own with time. It cannot be cured with medicines, but medicines may be prescribed or recommended to relieve symptoms. Medicines may help:  Reduce your fever.  Reduce your cough.  Relieve nasal congestion. HOME CARE INSTRUCTIONS   Take medicines only as directed by your health care provider.   Gargle warm saltwater or take cough drops to comfort your throat as directed by your health care provider.  Use a warm mist humidifier or inhale steam from a shower to increase air moisture. This may make it easier to breathe.  Drink enough fluid to keep your urine clear or pale yellow.   Eat soups and other clear broths and maintain good nutrition.   Rest as needed.   Return to work when your temperature has returned to normal or as your health care provider advises. You may need to stay home longer to avoid infecting others. You can also use a face mask and careful hand washing to prevent spread of the virus.  Increase the usage of your inhaler if you have asthma.   Do not use any tobacco products, including cigarettes, chewing tobacco, or electronic cigarettes. If you need help quitting, ask your health care provider. PREVENTION  The best way to protect yourself from getting a cold is to practice good hygiene.   Avoid oral or hand contact with people with cold   symptoms.   Wash your hands often if contact occurs.  There is no clear evidence that vitamin C, vitamin E, echinacea, or exercise reduces the chance of developing a cold. However, it is always recommended to get plenty of rest, exercise, and practice good nutrition.  SEEK MEDICAL CARE IF:   You are getting worse rather than better.   Your symptoms are not controlled by medicine.   You have chills.  You have worsening shortness of breath.  You have brown or red mucus.  You have yellow or brown nasal  discharge.  You have pain in your face, especially when you bend forward.  You have a fever.  You have swollen neck glands.  You have pain while swallowing.  You have white areas in the back of your throat. SEEK IMMEDIATE MEDICAL CARE IF:   You have severe or persistent:  Headache.  Ear pain.  Sinus pain.  Chest pain.  You have chronic lung disease and any of the following:  Wheezing.  Prolonged cough.  Coughing up blood.  A change in your usual mucus.  You have a stiff neck.  You have changes in your:  Vision.  Hearing.  Thinking.  Mood. MAKE SURE YOU:   Understand these instructions.  Will watch your condition.  Will get help right away if you are not doing well or get worse.   This information is not intended to replace advice given to you by your health care provider. Make sure you discuss any questions you have with your health care provider.   Document Released: 07/08/2000 Document Revised: 05/29/2014 Document Reviewed: 04/19/2013 Elsevier Interactive Patient Education 2016 [employer]  

## 2015-08-10 NOTE — ED Notes (Signed)
Vitals for  12:22 pm charted for wrong patient

## 2015-08-10 NOTE — ED Provider Notes (Signed)
CSN: [id number]     Arrival date & time 08/10/15  1034 History   First MD Initiated Contact with Patient 08/10/15 1045     Chief Complaint  Patient presents with  . Cough   HPI  [name] is a 75 year old female with PMHx including HTN, GERD, arthritis presenting with cough. Pt reports she has had a dry cough over the past week that is becoming more frequent. Denies associated chest pain, wheezing or SOB. She also complaints of a generalized headache, bilateral maxillary sinus pressure, nasal congestion and postnasal drip. She states that she blew her nose this morning and had a small amount of yellow nasal discharge. She has been taking Emergen-C and Nyquil. She reports moderate relief with the Nyquil but her symptoms return during the day. Denies hx of seasonal allergies. Pt states "my husband had this same thing last week and he gave it to me". Denies fevers, chills, dizziness, lightheadedness, syncope, vision changes, ear pain, sore throat, neck pain, neck stiffness, abdominal pain, nausea, vomiting or myalgias.   Past Medical History  Diagnosis Date  . Hypertension   . Hyperthyroidism     nodule on thyroid, Radioactive, no hypo  . Arthritis     knees  . Depression   . Diverticulosis     bleeding  . Hypothyroidism, iatrogenic     After RI now hypo on synthroid  . Diverticulitis   . GERD (gastroesophageal reflux disease)    Past Surgical History  Procedure Laterality Date  . Bladder surgery      bladder suspension  . Abdominal hysterectomy  1977  . Total knee revision  12/17/2010    Procedure: TOTAL KNEE REVISION;  Surgeon: Dione Plover Aluisio;  Location: [facility];  Service: Orthopedics;  Laterality: Right;  . Joint replacement Right 2009    total knee replacement  . Rotator cuff repair Left   . Cholecystectomy N/A 03/29/2013    Procedure: LAPAROSCOPIC CHOLECYSTECTOMY WITH INTRAOPERATIVE CHOLANGIOGRAM;  Surgeon: Edward Jolly, MD;  Location: [facility];  Service: General;   Laterality: N/A;   Family History  Problem Relation Age of Onset  . Diverticulitis Mother   . Colon cancer Maternal Grandmother   . Cancer Maternal Grandmother     colon  . Cancer Daughter     breast   Social History  Substance Use Topics  . Smoking status: Never Smoker   . Smokeless tobacco: Never Used  . Alcohol Use: No   OB History    No data available     Review of Systems  All other systems reviewed and are negative.     Allergies  Amitriptyline; Aspirin; Demerol; Nsaids; Demerol; Fish allergy; and Morphine and related  Home Medications   Prior to Admission medications   Medication Sig Start Date End Date Taking? Authorizing Provider  FOLIC ACID PO Take by mouth.   Yes Historical Provider, MD  acetaminophen (TYLENOL) 325 MG tablet Take 650 mg by mouth every 6 (six) hours as needed for mild pain.     Historical Provider, MD  azithromycin (ZITHROMAX) 250 MG tablet Take 1 tablet (250 mg total) by mouth daily. Take first 2 tablets together, then 1 every day until finished. 08/10/15   Takeisha Cianci, PA-C  benazepril-hydrochlorthiazide (LOTENSIN HCT) 20-12.5 MG per tablet Take 1 tablet by mouth daily. 12/13/13   Lanice Shirts, MD  Cholecalciferol (VITAMIN D3) 5000 UNITS CAPS Take by mouth daily.     Historical Provider, MD  ciprofloxacin (CIPRO) 500 MG  tablet Take 1 tablet (500 mg total) by mouth 2 (two) times daily. 04/05/15   Maryann Mikhail, DO  levothyroxine (SYNTHROID, LEVOTHROID) 75 MCG tablet Take 75 mcg by mouth daily. 03/19/15   Historical Provider, MD  metroNIDAZOLE (FLAGYL) 500 MG tablet Take 1 tablet (500 mg total) by mouth 3 (three) times daily. 04/05/15   Maryann Mikhail, DO  Multiple Vitamins-Minerals (HAIR/SKIN/NAILS PO) Take 1 tablet by mouth daily.    Historical Provider, MD  vitamin B-12 (CYANOCOBALAMIN) 100 MCG tablet daily.  12/13/13   Historical Provider, MD   BP 156/74 mmHg  Pulse 80  Temp(Src) 98.2 F (36.8 C) (Oral)  Resp 17  SpO2  100% Physical Exam  Constitutional: She is oriented to person, place, and time. She appears well-developed and well-nourished.  Non-toxic appearance. No distress.  HENT:  Head: Normocephalic and atraumatic.  Right Ear: Tympanic membrane and ear canal normal.  Left Ear: Tympanic membrane and ear canal normal.  Nose: Nose normal. No mucosal edema or rhinorrhea.  Mouth/Throat: Uvula is midline, oropharynx is clear and moist and mucous membranes are normal. No oropharyngeal exudate or posterior oropharyngeal erythema.  Eyes: Conjunctivae are normal. Right eye exhibits no discharge. Left eye exhibits no discharge. No scleral icterus.  Neck: Normal range of motion. Neck supple.  Cardiovascular: Normal rate, regular rhythm and normal heart sounds.   Pulmonary/Chest: Effort normal and breath sounds normal. No respiratory distress. She has no wheezes. She has no rales.  Abdominal: Soft. There is no tenderness.  Musculoskeletal: Normal range of motion.  Lymphadenopathy:    She has no cervical adenopathy.  Neurological: She is alert and oriented to person, place, and time. Coordination normal.  Skin: Skin is warm and dry.  Psychiatric: She has a normal mood and affect. Her behavior is normal.  Nursing note and vitals reviewed.   ED Course  Procedures (including critical care time) Labs Review Labs Reviewed  BASIC METABOLIC PANEL - Abnormal; Notable for the following:    Chloride 99 (*)    Glucose, Bld 138 (*)    GFR calc non Af Amer 56 (*)    All other components within normal limits  CBC WITH DIFFERENTIAL/PLATELET    Imaging Review Dg Chest 2 View  08/10/2015  CLINICAL DATA:  75 year old female with a history of headache and cough EXAM: CHEST  2 VIEW COMPARISON:  03/28/2013, CT 03/28/2013 FINDINGS: Cardiomediastinal silhouette unchanged. No confluent airspace disease.  No pleural effusion or pneumothorax. No displaced fracture. Surgical changes of cholecystectomy IMPRESSION: No  radiographic evidence of acute cardiopulmonary disease Signed, Dulcy Fanny. Earleen Newport, DO [facility] Electronically Signed   By: Corrie Mckusick D.O.   On: 08/10/2015 12:02   I have personally reviewed and evaluated these images and lab results as part of my medical decision-making.   EKG Interpretation None      MDM   Final diagnoses:  URI (upper respiratory infection)   75 year old female presenting with dry cough, sinus pressure/congestion, rhinorrhea and headache x 7 days. Afebrile and hemodynamically stable. No sinus TTP. No oropharyngeal erythema or exudate. Neck is supple with FROM and no adenopathy. Lungs CTAB. Heart RRR. Physical exam is benign. Lab work is reassuring. CXR negative for acute processes. No headache red flags. Pt presentation consistent with URI. Will d/c with azithromycin and follow up with PCP in 4-5 days if symptoms do not improve. Return precautions given in discharge paperwork and discussed with pt at bedside. Pt stable for discharge  Josephina Gip, PA-C 08/10/15 [place], MD 08/10/15 1422

## 2015-08-10 NOTE — ED Notes (Signed)
Patient c/o dry cough that has grown worse over the past week, headache, sinus pressure

## 2015-08-12 DIAGNOSIS — R05 Cough: Secondary | ICD-10-CM | POA: Diagnosis not present

## 2015-08-12 DIAGNOSIS — J209 Acute bronchitis, unspecified: Secondary | ICD-10-CM | POA: Diagnosis not present

## 2015-08-27 DIAGNOSIS — F331 Major depressive disorder, recurrent, moderate: Secondary | ICD-10-CM | POA: Diagnosis not present

## 2015-09-09 ENCOUNTER — Other Ambulatory Visit: Payer: Self-pay | Admitting: Orthopedic Surgery

## 2015-09-09 DIAGNOSIS — M545 Low back pain, unspecified: Secondary | ICD-10-CM

## 2015-09-23 ENCOUNTER — Ambulatory Visit
Admission: RE | Admit: 2015-09-23 | Discharge: 2015-09-23 | Disposition: A | Discharge status: Home / Self Care | Payer: PPO | Source: Ambulatory Visit | Attending: Orthopedic Surgery | Admitting: Orthopedic Surgery

## 2015-09-23 DIAGNOSIS — M5126 Other intervertebral disc displacement, lumbar region: Secondary | ICD-10-CM | POA: Diagnosis not present

## 2015-09-23 DIAGNOSIS — M545 Low back pain, unspecified: Secondary | ICD-10-CM

## 2015-09-23 MED ORDER — IOPAMIDOL (ISOVUE-M 200) INJECTION 41%
1.0000 mL | Freq: Once | INTRAMUSCULAR | Status: AC
  Administered 2015-09-23: 1 mL via EPIDURAL

## 2015-09-23 MED ORDER — METHYLPREDNISOLONE ACETATE 40 MG/ML INJ SUSP (RADIOLOG
120.0000 mg | Freq: Once | INTRAMUSCULAR | Status: AC
  Administered 2015-09-23: 120 mg via EPIDURAL

## 2015-09-24 ENCOUNTER — Other Ambulatory Visit: Payer: Self-pay | Admitting: Internal Medicine

## 2015-09-24 DIAGNOSIS — N6310 Unspecified lump in the right breast, unspecified quadrant: Secondary | ICD-10-CM

## 2015-10-01 ENCOUNTER — Other Ambulatory Visit: Payer: PPO

## 2015-10-08 ENCOUNTER — Ambulatory Visit
Admission: RE | Admit: 2015-10-08 | Discharge: 2015-10-08 | Disposition: A | Discharge status: Home / Self Care | Payer: PPO | Source: Ambulatory Visit | Attending: Internal Medicine | Admitting: Internal Medicine

## 2015-10-08 DIAGNOSIS — N6310 Unspecified lump in the right breast, unspecified quadrant: Secondary | ICD-10-CM

## 2015-10-08 DIAGNOSIS — R922 Inconclusive mammogram: Secondary | ICD-10-CM | POA: Diagnosis not present

## 2015-11-08 DIAGNOSIS — M170 Bilateral primary osteoarthritis of knee: Secondary | ICD-10-CM | POA: Diagnosis not present

## 2015-11-08 DIAGNOSIS — M180 Bilateral primary osteoarthritis of first carpometacarpal joints: Secondary | ICD-10-CM | POA: Diagnosis not present

## 2015-11-08 DIAGNOSIS — M1811 Unilateral primary osteoarthritis of first carpometacarpal joint, right hand: Secondary | ICD-10-CM | POA: Diagnosis not present

## 2015-11-08 DIAGNOSIS — M1812 Unilateral primary osteoarthritis of first carpometacarpal joint, left hand: Secondary | ICD-10-CM | POA: Diagnosis not present

## 2015-11-20 DIAGNOSIS — E559 Vitamin D deficiency, unspecified: Secondary | ICD-10-CM | POA: Diagnosis not present

## 2015-11-20 DIAGNOSIS — E039 Hypothyroidism, unspecified: Secondary | ICD-10-CM | POA: Diagnosis not present

## 2015-11-20 DIAGNOSIS — E2839 Other primary ovarian failure: Secondary | ICD-10-CM | POA: Diagnosis not present

## 2015-11-20 DIAGNOSIS — Z1389 Encounter for screening for other disorder: Secondary | ICD-10-CM | POA: Diagnosis not present

## 2015-11-20 DIAGNOSIS — Z0000 Encounter for general adult medical examination without abnormal findings: Secondary | ICD-10-CM | POA: Diagnosis not present

## 2015-11-20 DIAGNOSIS — E7800 Pure hypercholesterolemia, unspecified: Secondary | ICD-10-CM | POA: Diagnosis not present

## 2015-11-27 ENCOUNTER — Ambulatory Visit
Admission: RE | Admit: 2015-11-27 | Discharge: 2015-11-27 | Disposition: A | Discharge status: Home / Self Care | Payer: PPO | Source: Ambulatory Visit | Attending: Internal Medicine | Admitting: Internal Medicine

## 2015-11-27 ENCOUNTER — Other Ambulatory Visit: Payer: Self-pay | Admitting: Internal Medicine

## 2015-11-27 DIAGNOSIS — R0609 Other forms of dyspnea: Secondary | ICD-10-CM | POA: Diagnosis not present

## 2015-11-27 DIAGNOSIS — R05 Cough: Secondary | ICD-10-CM | POA: Diagnosis not present

## 2015-11-27 DIAGNOSIS — I10 Essential (primary) hypertension: Secondary | ICD-10-CM

## 2015-11-27 DIAGNOSIS — G44209 Tension-type headache, unspecified, not intractable: Secondary | ICD-10-CM | POA: Diagnosis not present

## 2015-11-27 DIAGNOSIS — R0602 Shortness of breath: Secondary | ICD-10-CM | POA: Diagnosis not present

## 2015-11-29 DIAGNOSIS — Z1211 Encounter for screening for malignant neoplasm of colon: Secondary | ICD-10-CM | POA: Diagnosis not present

## 2015-12-04 DIAGNOSIS — F411 Generalized anxiety disorder: Secondary | ICD-10-CM | POA: Diagnosis not present

## 2015-12-04 DIAGNOSIS — E7800 Pure hypercholesterolemia, unspecified: Secondary | ICD-10-CM | POA: Diagnosis not present

## 2015-12-04 DIAGNOSIS — M1990 Unspecified osteoarthritis, unspecified site: Secondary | ICD-10-CM | POA: Diagnosis not present

## 2015-12-10 DIAGNOSIS — E2839 Other primary ovarian failure: Secondary | ICD-10-CM | POA: Diagnosis not present

## 2015-12-10 DIAGNOSIS — M8588 Other specified disorders of bone density and structure, other site: Secondary | ICD-10-CM | POA: Diagnosis not present

## 2015-12-23 ENCOUNTER — Other Ambulatory Visit: Payer: Self-pay | Admitting: Orthopedic Surgery

## 2015-12-23 DIAGNOSIS — M545 Low back pain, unspecified: Secondary | ICD-10-CM

## 2016-01-16 ENCOUNTER — Ambulatory Visit
Admission: RE | Admit: 2016-01-16 | Discharge: 2016-01-16 | Disposition: A | Discharge status: Home / Self Care | Payer: PPO | Source: Ambulatory Visit | Attending: Orthopedic Surgery | Admitting: Orthopedic Surgery

## 2016-01-16 DIAGNOSIS — M545 Low back pain, unspecified: Secondary | ICD-10-CM

## 2016-01-16 DIAGNOSIS — M5126 Other intervertebral disc displacement, lumbar region: Secondary | ICD-10-CM | POA: Diagnosis not present

## 2016-01-16 MED ORDER — IOPAMIDOL (ISOVUE-M 200) INJECTION 41%
1.0000 mL | Freq: Once | INTRAMUSCULAR | Status: AC
  Administered 2016-01-16: 1 mL via EPIDURAL

## 2016-01-16 MED ORDER — METHYLPREDNISOLONE ACETATE 40 MG/ML INJ SUSP (RADIOLOG
120.0000 mg | Freq: Once | INTRAMUSCULAR | Status: AC
  Administered 2016-01-16: 120 mg via EPIDURAL

## 2016-01-22 DIAGNOSIS — E7800 Pure hypercholesterolemia, unspecified: Secondary | ICD-10-CM | POA: Diagnosis not present

## 2016-01-22 DIAGNOSIS — M1990 Unspecified osteoarthritis, unspecified site: Secondary | ICD-10-CM | POA: Diagnosis not present

## 2016-01-22 DIAGNOSIS — R350 Frequency of micturition: Secondary | ICD-10-CM | POA: Diagnosis not present

## 2016-01-27 HISTORY — PX: BREAST LUMPECTOMY: SHX2

## 2016-02-11 ENCOUNTER — Telehealth: Payer: Self-pay | Admitting: Cardiovascular Disease

## 2016-02-11 NOTE — Telephone Encounter (Signed)
Received records from [facility] for appointment on 02/28/16 with Dr Oval Linsey.  Records put with Dr Blenda Mounts schedule for 02/28/16. lp

## 2016-02-28 ENCOUNTER — Ambulatory Visit (INDEPENDENT_AMBULATORY_CARE_PROVIDER_SITE_OTHER): Payer: PPO | Admitting: Cardiovascular Disease

## 2016-02-28 ENCOUNTER — Encounter: Payer: Self-pay | Admitting: Cardiovascular Disease

## 2016-02-28 VITALS — BP 124/74 | HR 80 | Ht 62.0 in | Wt 172.1 lb

## 2016-02-28 DIAGNOSIS — E7800 Pure hypercholesterolemia, unspecified: Secondary | ICD-10-CM | POA: Diagnosis not present

## 2016-02-28 DIAGNOSIS — I10 Essential (primary) hypertension: Secondary | ICD-10-CM

## 2016-02-28 DIAGNOSIS — R0602 Shortness of breath: Secondary | ICD-10-CM

## 2016-02-28 NOTE — Patient Instructions (Addendum)
Medication Instructions:  Your physician recommends that you continue on your current medications as directed. Please refer to the Current Medication list given to you today.  Labwork: NONE  Testing/Procedures: Your physician has requested that you have a lexiscan myoview. For further information please visit [email or web address]. Please follow instruction sheet, as given.  Follow-Up: AS NEEDED   If you need a refill on your cardiac medications before your next appointment, please call your pharmacy.

## 2016-02-28 NOTE — Progress Notes (Signed)
Cardiology Office Note   Date:  02/28/2016   ID:  [name], DOB July 01, 1940, MRN [id number]  PCP:  Kelton Pillar, MD  Cardiologist:   Skeet Latch, MD   Chief Complaint  Patient presents with  . New Patient (Initial Visit)    having some shortness of breath with exertion      History of Present Illness: [name] is a 76 y.o. female with hypertension and hyperlipidemia who presents for an evaluation of shortness of breath.  [name] notes that since last summer she has been increasingly short of breath.  She gets short of breath with walking or when climbing the stairs.  She denies chest pain or pressure, nausea or diaphoreiss.  She Also denies orthopnea or PND. She has not noted any lower extremity edema.  She reported these symptoms to her PCP, Dr. Emi Belfast and was referred to cardiology for further evaluation.  [name] was started on rosuvastatin a few weeks ago. So far she has not noted any myalgias. She does not get any regular exercise. She does sometimes do some walking in the mall. She is hoping to start an exercise regimen and wanted to make sure her heart was okay before doing so. She is scheduled for surgery on her thumb next Tuesday. She somewhat limited in her ability to exercise due to back and bilateral knee pain. She previously had her right knee replaced.   Past Medical History:  Diagnosis Date  . Arthritis    knees  . Depression   . Diverticulitis   . Diverticulosis    bleeding  . GERD (gastroesophageal reflux disease)   . Hypertension   . Hyperthyroidism    nodule on thyroid, Radioactive, no hypo  . Hypothyroidism, iatrogenic    After RI now hypo on synthroid    Past Surgical History:  Procedure Laterality Date  . ABDOMINAL HYSTERECTOMY  1977  . BLADDER SURGERY     bladder suspension  . CHOLECYSTECTOMY N/A 03/29/2013   Procedure: LAPAROSCOPIC CHOLECYSTECTOMY WITH INTRAOPERATIVE CHOLANGIOGRAM;  Surgeon: Edward Jolly, MD;  Location: [facility];  Service: General;  Laterality: N/A;  . JOINT REPLACEMENT Right 2009   total knee replacement  . ROTATOR CUFF REPAIR Left   . TOTAL KNEE REVISION  12/17/2010   Procedure: TOTAL KNEE REVISION;  Surgeon: Dione Plover Aluisio;  Location: [facility];  Service: Orthopedics;  Laterality: Right;     Current Outpatient Prescriptions  Medication Sig Dispense Refill  . benazepril-hydrochlorthiazide (LOTENSIN HCT) 20-12.5 MG per tablet Take 1 tablet by mouth daily. 90 tablet 2  . Cholecalciferol (VITAMIN D3) 5000 UNITS CAPS Take by mouth daily.     Marland Kitchen escitalopram (LEXAPRO) 10 MG tablet Take 10 mg by mouth daily.  1  . FOLIC ACID PO Take by mouth.    . levothyroxine (SYNTHROID, LEVOTHROID) 88 MCG tablet Take 88 mcg by mouth daily.  1  . Multiple Vitamins-Minerals (HAIR/SKIN/NAILS PO) Take 1 tablet by mouth daily.    . rosuvastatin (CRESTOR) 5 MG tablet Take 1 tablet by mouth as directed. Monday Wednesday friday  0  . vitamin B-12 (CYANOCOBALAMIN) 100 MCG tablet daily.      No current facility-administered medications for this visit.     Allergies:   Amitriptyline; Aspirin; Demerol; Nsaids; Demerol [meperidine]; Fish allergy; and Morphine and related    Social History:  The patient  reports that she has never smoked. She has never used smokeless tobacco. She reports that she does  not drink alcohol or use drugs.   Family History:  The patient's family history includes Cancer in her daughter and maternal grandmother; Colon cancer in her maternal grandmother; Diverticulitis in her mother; Heart attack in her father.    ROS:  Please see the history of present illness.   Otherwise, review of systems are positive for none.   All other systems are reviewed and negative.    PHYSICAL EXAM: VS:  BP 124/74   Pulse 80   Ht 5\' 2"  (1.575 m)   Wt 78.1 kg (172 lb 2 oz)   BMI 31.48 kg/m  , BMI Body mass index is 31.48 kg/m. GENERAL:  Well appearing HEENT:  Pupils equal round and  reactive, fundi not visualized, oral mucosa unremarkable NECK:  No jugular venous distention, waveform within normal limits, carotid upstroke brisk and symmetric, no bruits, no thyromegaly LYMPHATICS:  No cervical adenopathy LUNGS:  Clear to auscultation bilaterally HEART:  RRR.  PMI not displaced or sustained,S1 and S2 within normal limits, no S3, no S4, no clicks, no rubs, no murmurs ABD:  Flat, positive bowel sounds normal in frequency in pitch, no bruits, no rebound, no guarding, no midline pulsatile mass, no hepatomegaly, no splenomegaly EXT:  2 plus pulses throughout, no edema, no cyanosis no clubbing SKIN:  No rashes no nodules NEURO:  Cranial nerves II through XII grossly intact, motor grossly intact throughout PSYCH:  Cognitively intact, oriented to person place and time   EKG:  EKG is ordered today. The ekg ordered today demonstrates sinus rhythm.  Rate 81 bpm   Recent Labs: 04/05/2015: ALT 14 08/10/2015: BUN 16; Creatinine, Ser 0.99; Hemoglobin 13.4; Platelets 238; Potassium 3.5; Sodium 138    Lipid Panel    Component Value Date/Time   CHOL 229 (H) 12/13/2013 1209   TRIG 241 (H) 12/13/2013 1209   HDL 51 12/13/2013 1209   CHOLHDL 4.5 12/13/2013 1209   VLDL 48 (H) 12/13/2013 1209   LDLCALC 130 (H) 12/13/2013 1209      Wt Readings from Last 3 Encounters:  02/28/16 78.1 kg (172 lb 2 oz)  04/04/15 71.7 kg (158 lb)  02/15/15 72.1 kg (159 lb)      ASSESSMENT AND PLAN:  # Shortness of breath: [name] does not have any evidence of heart failure on exam. There are no murmurs, which makes an echocardiogram unlikely to be helpful. We will refer her for Lexiscan Myoview to evaluate for ischemia. I suspect that her symptoms are more related to obesity and central adiposity. If her stress test is normal we will encourage her to start an exercise regimen for at least 30-40 minutes most days of the week. We also discussed dietary changes. She expressed understanding.  #  Hypertension: Blood pressure is well-controlled on benazepril-HCTZ.  # Hyperlipidemia: Continue rosuvastatin.   Current medicines are reviewed at length with the patient today.  The patient does not have concerns regarding medicines.  The following changes have been made:  no change  Labs/ tests ordered today include:   Orders Placed This Encounter  Procedures  . Myocardial Perfusion Imaging  . EKG 12-Lead     Disposition:   FU with Saifullah Jolley C. Oval Linsey, MD, [facility] as needed.     This note was written with the assistance of speech recognition software.  Please excuse any transcriptional errors.  Signed, Akoni Parton C. Oval Linsey, MD, [facility]  02/28/2016 3:00 PM    [place]

## 2016-03-02 ENCOUNTER — Other Ambulatory Visit: Payer: Self-pay | Admitting: Internal Medicine

## 2016-03-02 DIAGNOSIS — Z1231 Encounter for screening mammogram for malignant neoplasm of breast: Secondary | ICD-10-CM

## 2016-03-03 DIAGNOSIS — M13132 Monoarthritis, not elsewhere classified, left wrist: Secondary | ICD-10-CM | POA: Diagnosis not present

## 2016-03-03 DIAGNOSIS — G8918 Other acute postprocedural pain: Secondary | ICD-10-CM | POA: Diagnosis not present

## 2016-03-03 DIAGNOSIS — M1812 Unilateral primary osteoarthritis of first carpometacarpal joint, left hand: Secondary | ICD-10-CM | POA: Diagnosis not present

## 2016-03-18 DIAGNOSIS — M1812 Unilateral primary osteoarthritis of first carpometacarpal joint, left hand: Secondary | ICD-10-CM | POA: Diagnosis not present

## 2016-03-18 DIAGNOSIS — Z4789 Encounter for other orthopedic aftercare: Secondary | ICD-10-CM | POA: Diagnosis not present

## 2016-03-19 ENCOUNTER — Telehealth (HOSPITAL_COMMUNITY): Payer: Self-pay

## 2016-03-19 NOTE — Telephone Encounter (Signed)
Encounter complete. 

## 2016-03-24 ENCOUNTER — Ambulatory Visit (HOSPITAL_COMMUNITY)
Admission: RE | Admit: 2016-03-24 | Discharge: 2016-03-24 | Disposition: A | Discharge status: Home / Self Care | Payer: PPO | Source: Ambulatory Visit | Attending: Cardiovascular Disease | Admitting: Cardiovascular Disease

## 2016-03-24 DIAGNOSIS — R0602 Shortness of breath: Secondary | ICD-10-CM | POA: Diagnosis not present

## 2016-03-24 MED ORDER — TECHNETIUM TC 99M TETROFOSMIN IV KIT
9.8000 | PACK | Freq: Once | INTRAVENOUS | Status: AC | PRN
  Administered 2016-03-24: 9.8 via INTRAVENOUS
  Filled 2016-03-24: qty 10

## 2016-03-24 MED ORDER — TECHNETIUM TC 99M TETROFOSMIN IV KIT
31.2000 | PACK | Freq: Once | INTRAVENOUS | Status: AC | PRN
  Administered 2016-03-24: 31.2 via INTRAVENOUS
  Filled 2016-03-24: qty 32

## 2016-03-24 MED ORDER — REGADENOSON 0.4 MG/5ML IV SOLN
0.4000 mg | Freq: Once | INTRAVENOUS | Status: AC
  Administered 2016-03-24: 0.4 mg via INTRAVENOUS

## 2016-03-25 LAB — MYOCARDIAL PERFUSION IMAGING
CHL CUP RESTING HR STRESS: 88 {beats}/min
LVDIAVOL: 31 mL (ref 46–106)
LVSYSVOL: 4 mL
NUC STRESS TID: 2.13
Peak HR: 101 {beats}/min
SDS: 3
SRS: 4
SSS: 7

## 2016-04-01 ENCOUNTER — Ambulatory Visit
Admission: RE | Admit: 2016-04-01 | Discharge: 2016-04-01 | Disposition: A | Discharge status: Home / Self Care | Payer: PPO | Source: Ambulatory Visit | Attending: Internal Medicine | Admitting: Internal Medicine

## 2016-04-01 DIAGNOSIS — Z1231 Encounter for screening mammogram for malignant neoplasm of breast: Secondary | ICD-10-CM | POA: Diagnosis not present

## 2016-04-02 DIAGNOSIS — M1812 Unilateral primary osteoarthritis of first carpometacarpal joint, left hand: Secondary | ICD-10-CM | POA: Diagnosis not present

## 2016-04-02 DIAGNOSIS — Z4789 Encounter for other orthopedic aftercare: Secondary | ICD-10-CM | POA: Diagnosis not present

## 2016-04-08 DIAGNOSIS — M1812 Unilateral primary osteoarthritis of first carpometacarpal joint, left hand: Secondary | ICD-10-CM | POA: Diagnosis not present

## 2016-04-15 DIAGNOSIS — M79645 Pain in left finger(s): Secondary | ICD-10-CM | POA: Diagnosis not present

## 2016-04-15 DIAGNOSIS — Z4789 Encounter for other orthopedic aftercare: Secondary | ICD-10-CM | POA: Diagnosis not present

## 2016-04-21 DIAGNOSIS — R7301 Impaired fasting glucose: Secondary | ICD-10-CM | POA: Diagnosis not present

## 2016-04-21 DIAGNOSIS — K219 Gastro-esophageal reflux disease without esophagitis: Secondary | ICD-10-CM | POA: Diagnosis not present

## 2016-04-21 DIAGNOSIS — R197 Diarrhea, unspecified: Secondary | ICD-10-CM | POA: Diagnosis not present

## 2016-04-21 DIAGNOSIS — K909 Intestinal malabsorption, unspecified: Secondary | ICD-10-CM | POA: Diagnosis not present

## 2016-04-21 DIAGNOSIS — M79645 Pain in left finger(s): Secondary | ICD-10-CM | POA: Diagnosis not present

## 2016-04-27 DIAGNOSIS — R739 Hyperglycemia, unspecified: Secondary | ICD-10-CM | POA: Diagnosis not present

## 2016-04-27 DIAGNOSIS — E7800 Pure hypercholesterolemia, unspecified: Secondary | ICD-10-CM | POA: Diagnosis not present

## 2016-04-27 DIAGNOSIS — E039 Hypothyroidism, unspecified: Secondary | ICD-10-CM | POA: Diagnosis not present

## 2016-04-27 DIAGNOSIS — R0609 Other forms of dyspnea: Secondary | ICD-10-CM | POA: Diagnosis not present

## 2016-04-29 DIAGNOSIS — K909 Intestinal malabsorption, unspecified: Secondary | ICD-10-CM | POA: Diagnosis not present

## 2016-04-30 DIAGNOSIS — R635 Abnormal weight gain: Secondary | ICD-10-CM | POA: Diagnosis not present

## 2016-04-30 DIAGNOSIS — K5730 Diverticulosis of large intestine without perforation or abscess without bleeding: Secondary | ICD-10-CM | POA: Diagnosis not present

## 2016-04-30 DIAGNOSIS — K9089 Other intestinal malabsorption: Secondary | ICD-10-CM | POA: Diagnosis not present

## 2016-05-04 DIAGNOSIS — M5136 Other intervertebral disc degeneration, lumbar region: Secondary | ICD-10-CM | POA: Diagnosis not present

## 2016-05-12 ENCOUNTER — Other Ambulatory Visit: Payer: Self-pay | Admitting: Orthopedic Surgery

## 2016-05-12 DIAGNOSIS — M5136 Other intervertebral disc degeneration, lumbar region: Secondary | ICD-10-CM

## 2016-05-13 DIAGNOSIS — Z4789 Encounter for other orthopedic aftercare: Secondary | ICD-10-CM | POA: Diagnosis not present

## 2016-05-13 DIAGNOSIS — M79645 Pain in left finger(s): Secondary | ICD-10-CM | POA: Diagnosis not present

## 2016-05-14 DIAGNOSIS — L820 Inflamed seborrheic keratosis: Secondary | ICD-10-CM | POA: Diagnosis not present

## 2016-05-14 DIAGNOSIS — D485 Neoplasm of uncertain behavior of skin: Secondary | ICD-10-CM | POA: Diagnosis not present

## 2016-05-14 DIAGNOSIS — D2272 Melanocytic nevi of left lower limb, including hip: Secondary | ICD-10-CM | POA: Diagnosis not present

## 2016-05-14 DIAGNOSIS — L821 Other seborrheic keratosis: Secondary | ICD-10-CM | POA: Diagnosis not present

## 2016-05-14 DIAGNOSIS — C44519 Basal cell carcinoma of skin of other part of trunk: Secondary | ICD-10-CM | POA: Diagnosis not present

## 2016-05-14 DIAGNOSIS — Z808 Family history of malignant neoplasm of other organs or systems: Secondary | ICD-10-CM | POA: Diagnosis not present

## 2016-05-15 ENCOUNTER — Ambulatory Visit
Admission: RE | Admit: 2016-05-15 | Discharge: 2016-05-15 | Disposition: A | Discharge status: Home / Self Care | Payer: PPO | Source: Ambulatory Visit | Attending: Orthopedic Surgery | Admitting: Orthopedic Surgery

## 2016-05-15 ENCOUNTER — Other Ambulatory Visit: Payer: Self-pay | Admitting: Orthopedic Surgery

## 2016-05-15 DIAGNOSIS — M47817 Spondylosis without myelopathy or radiculopathy, lumbosacral region: Secondary | ICD-10-CM | POA: Diagnosis not present

## 2016-05-15 DIAGNOSIS — M5136 Other intervertebral disc degeneration, lumbar region: Secondary | ICD-10-CM

## 2016-05-15 MED ORDER — IOPAMIDOL (ISOVUE-M 200) INJECTION 41%
1.0000 mL | Freq: Once | INTRAMUSCULAR | Status: AC
  Administered 2016-05-15: 1 mL via EPIDURAL

## 2016-05-15 MED ORDER — METHYLPREDNISOLONE ACETATE 40 MG/ML INJ SUSP (RADIOLOG
120.0000 mg | Freq: Once | INTRAMUSCULAR | Status: AC
  Administered 2016-05-15: 120 mg via EPIDURAL

## 2016-05-15 NOTE — Discharge Instructions (Signed)

## 2016-06-10 DIAGNOSIS — M1811 Unilateral primary osteoarthritis of first carpometacarpal joint, right hand: Secondary | ICD-10-CM | POA: Diagnosis not present

## 2016-06-12 DIAGNOSIS — H04123 Dry eye syndrome of bilateral lacrimal glands: Secondary | ICD-10-CM | POA: Diagnosis not present

## 2016-06-16 DIAGNOSIS — C44519 Basal cell carcinoma of skin of other part of trunk: Secondary | ICD-10-CM | POA: Diagnosis not present

## 2016-06-24 DIAGNOSIS — E119 Type 2 diabetes mellitus without complications: Secondary | ICD-10-CM | POA: Diagnosis not present

## 2016-06-24 DIAGNOSIS — K9089 Other intestinal malabsorption: Secondary | ICD-10-CM | POA: Diagnosis not present

## 2016-06-24 DIAGNOSIS — E039 Hypothyroidism, unspecified: Secondary | ICD-10-CM | POA: Diagnosis not present

## 2016-07-30 DIAGNOSIS — K219 Gastro-esophageal reflux disease without esophagitis: Secondary | ICD-10-CM | POA: Diagnosis not present

## 2016-07-30 DIAGNOSIS — R194 Change in bowel habit: Secondary | ICD-10-CM | POA: Diagnosis not present

## 2016-07-30 DIAGNOSIS — R1013 Epigastric pain: Secondary | ICD-10-CM | POA: Diagnosis not present

## 2016-07-30 DIAGNOSIS — E669 Obesity, unspecified: Secondary | ICD-10-CM | POA: Diagnosis not present

## 2016-07-30 DIAGNOSIS — K5730 Diverticulosis of large intestine without perforation or abscess without bleeding: Secondary | ICD-10-CM | POA: Diagnosis not present

## 2016-08-12 DIAGNOSIS — K2970 Gastritis, unspecified, without bleeding: Secondary | ICD-10-CM | POA: Diagnosis not present

## 2016-08-12 DIAGNOSIS — K210 Gastro-esophageal reflux disease with esophagitis: Secondary | ICD-10-CM | POA: Diagnosis not present

## 2016-08-20 DIAGNOSIS — M25561 Pain in right knee: Secondary | ICD-10-CM | POA: Diagnosis not present

## 2016-08-20 DIAGNOSIS — K219 Gastro-esophageal reflux disease without esophagitis: Secondary | ICD-10-CM | POA: Diagnosis not present

## 2016-08-20 DIAGNOSIS — M1990 Unspecified osteoarthritis, unspecified site: Secondary | ICD-10-CM | POA: Diagnosis not present

## 2016-08-20 DIAGNOSIS — M25562 Pain in left knee: Secondary | ICD-10-CM | POA: Diagnosis not present

## 2016-08-22 DIAGNOSIS — J029 Acute pharyngitis, unspecified: Secondary | ICD-10-CM | POA: Diagnosis not present

## 2016-09-03 DIAGNOSIS — K219 Gastro-esophageal reflux disease without esophagitis: Secondary | ICD-10-CM | POA: Diagnosis not present

## 2016-09-03 DIAGNOSIS — E119 Type 2 diabetes mellitus without complications: Secondary | ICD-10-CM | POA: Diagnosis not present

## 2016-09-03 DIAGNOSIS — L989 Disorder of the skin and subcutaneous tissue, unspecified: Secondary | ICD-10-CM | POA: Diagnosis not present

## 2016-09-03 DIAGNOSIS — M1990 Unspecified osteoarthritis, unspecified site: Secondary | ICD-10-CM | POA: Diagnosis not present

## 2016-09-03 DIAGNOSIS — E039 Hypothyroidism, unspecified: Secondary | ICD-10-CM | POA: Diagnosis not present

## 2016-09-07 ENCOUNTER — Other Ambulatory Visit: Payer: Self-pay | Admitting: Physician Assistant

## 2016-09-07 DIAGNOSIS — M5136 Other intervertebral disc degeneration, lumbar region: Secondary | ICD-10-CM

## 2016-09-10 DIAGNOSIS — Z800 Family history of malignant neoplasm of digestive organs: Secondary | ICD-10-CM | POA: Diagnosis not present

## 2016-09-10 DIAGNOSIS — K5730 Diverticulosis of large intestine without perforation or abscess without bleeding: Secondary | ICD-10-CM | POA: Diagnosis not present

## 2016-09-10 DIAGNOSIS — K210 Gastro-esophageal reflux disease with esophagitis: Secondary | ICD-10-CM | POA: Diagnosis not present

## 2016-09-10 DIAGNOSIS — E669 Obesity, unspecified: Secondary | ICD-10-CM | POA: Diagnosis not present

## 2016-09-24 DIAGNOSIS — M1811 Unilateral primary osteoarthritis of first carpometacarpal joint, right hand: Secondary | ICD-10-CM | POA: Diagnosis not present

## 2016-09-24 DIAGNOSIS — G8918 Other acute postprocedural pain: Secondary | ICD-10-CM | POA: Diagnosis not present

## 2016-09-29 DIAGNOSIS — Z7984 Long term (current) use of oral hypoglycemic drugs: Secondary | ICD-10-CM | POA: Diagnosis not present

## 2016-09-29 DIAGNOSIS — E119 Type 2 diabetes mellitus without complications: Secondary | ICD-10-CM | POA: Diagnosis not present

## 2016-09-29 DIAGNOSIS — I10 Essential (primary) hypertension: Secondary | ICD-10-CM | POA: Diagnosis not present

## 2016-10-01 DIAGNOSIS — L820 Inflamed seborrheic keratosis: Secondary | ICD-10-CM | POA: Diagnosis not present

## 2016-10-01 DIAGNOSIS — L570 Actinic keratosis: Secondary | ICD-10-CM | POA: Diagnosis not present

## 2016-10-02 ENCOUNTER — Ambulatory Visit
Admission: RE | Admit: 2016-10-02 | Discharge: 2016-10-02 | Disposition: A | Discharge status: Home / Self Care | Payer: Self-pay | Source: Ambulatory Visit | Attending: Physician Assistant | Admitting: Physician Assistant

## 2016-10-02 DIAGNOSIS — M5116 Intervertebral disc disorders with radiculopathy, lumbar region: Secondary | ICD-10-CM | POA: Diagnosis not present

## 2016-10-02 DIAGNOSIS — M5136 Other intervertebral disc degeneration, lumbar region: Secondary | ICD-10-CM

## 2016-10-02 MED ORDER — IOPAMIDOL (ISOVUE-M 200) INJECTION 41%
1.0000 mL | Freq: Once | INTRAMUSCULAR | Status: AC
  Administered 2016-10-02: 1 mL via EPIDURAL

## 2016-10-02 MED ORDER — METHYLPREDNISOLONE ACETATE 40 MG/ML INJ SUSP (RADIOLOG
120.0000 mg | Freq: Once | INTRAMUSCULAR | Status: AC
  Administered 2016-10-02: 120 mg via EPIDURAL

## 2016-10-02 NOTE — Discharge Instructions (Signed)

## 2016-10-08 DIAGNOSIS — M1811 Unilateral primary osteoarthritis of first carpometacarpal joint, right hand: Secondary | ICD-10-CM | POA: Diagnosis not present

## 2016-10-22 DIAGNOSIS — M1811 Unilateral primary osteoarthritis of first carpometacarpal joint, right hand: Secondary | ICD-10-CM | POA: Diagnosis not present

## 2016-10-27 ENCOUNTER — Other Ambulatory Visit: Payer: Self-pay | Admitting: Internal Medicine

## 2016-10-27 ENCOUNTER — Other Ambulatory Visit (HOSPITAL_BASED_OUTPATIENT_CLINIC_OR_DEPARTMENT_OTHER): Payer: Self-pay | Admitting: Internal Medicine

## 2016-10-27 DIAGNOSIS — R1030 Lower abdominal pain, unspecified: Secondary | ICD-10-CM | POA: Diagnosis not present

## 2016-10-27 DIAGNOSIS — K529 Noninfective gastroenteritis and colitis, unspecified: Secondary | ICD-10-CM | POA: Diagnosis not present

## 2016-10-27 DIAGNOSIS — R109 Unspecified abdominal pain: Secondary | ICD-10-CM

## 2016-10-28 ENCOUNTER — Ambulatory Visit (HOSPITAL_BASED_OUTPATIENT_CLINIC_OR_DEPARTMENT_OTHER)
Admission: RE | Admit: 2016-10-28 | Discharge: 2016-10-28 | Disposition: A | Discharge status: Home / Self Care | Payer: PPO | Source: Ambulatory Visit | Attending: Internal Medicine | Admitting: Internal Medicine

## 2016-10-28 ENCOUNTER — Encounter (HOSPITAL_BASED_OUTPATIENT_CLINIC_OR_DEPARTMENT_OTHER): Payer: Self-pay

## 2016-10-28 DIAGNOSIS — K5730 Diverticulosis of large intestine without perforation or abscess without bleeding: Secondary | ICD-10-CM | POA: Diagnosis not present

## 2016-10-28 DIAGNOSIS — R109 Unspecified abdominal pain: Secondary | ICD-10-CM

## 2016-10-28 DIAGNOSIS — N200 Calculus of kidney: Secondary | ICD-10-CM | POA: Insufficient documentation

## 2016-10-28 DIAGNOSIS — M4316 Spondylolisthesis, lumbar region: Secondary | ICD-10-CM | POA: Insufficient documentation

## 2016-10-28 DIAGNOSIS — K760 Fatty (change of) liver, not elsewhere classified: Secondary | ICD-10-CM | POA: Diagnosis not present

## 2016-10-28 DIAGNOSIS — M5137 Other intervertebral disc degeneration, lumbosacral region: Secondary | ICD-10-CM | POA: Diagnosis not present

## 2016-10-28 DIAGNOSIS — R197 Diarrhea, unspecified: Secondary | ICD-10-CM | POA: Diagnosis not present

## 2016-10-28 MED ORDER — IOPAMIDOL (ISOVUE-300) INJECTION 61%
100.0000 mL | Freq: Once | INTRAVENOUS | Status: AC | PRN
  Administered 2016-10-28: 100 mL via INTRAVENOUS

## 2016-11-02 ENCOUNTER — Other Ambulatory Visit: Payer: Self-pay

## 2016-11-05 DIAGNOSIS — M79644 Pain in right finger(s): Secondary | ICD-10-CM | POA: Diagnosis not present

## 2016-11-05 DIAGNOSIS — M1811 Unilateral primary osteoarthritis of first carpometacarpal joint, right hand: Secondary | ICD-10-CM | POA: Diagnosis not present

## 2016-11-05 DIAGNOSIS — Z4789 Encounter for other orthopedic aftercare: Secondary | ICD-10-CM | POA: Diagnosis not present

## 2016-11-11 ENCOUNTER — Other Ambulatory Visit: Payer: Self-pay | Admitting: Internal Medicine

## 2016-11-11 DIAGNOSIS — Z0001 Encounter for general adult medical examination with abnormal findings: Secondary | ICD-10-CM | POA: Diagnosis not present

## 2016-11-11 DIAGNOSIS — Z1389 Encounter for screening for other disorder: Secondary | ICD-10-CM | POA: Diagnosis not present

## 2016-11-11 DIAGNOSIS — N63 Unspecified lump in unspecified breast: Secondary | ICD-10-CM

## 2016-11-11 DIAGNOSIS — N6320 Unspecified lump in the left breast, unspecified quadrant: Secondary | ICD-10-CM | POA: Diagnosis not present

## 2016-11-13 DIAGNOSIS — M79644 Pain in right finger(s): Secondary | ICD-10-CM | POA: Diagnosis not present

## 2016-11-17 ENCOUNTER — Ambulatory Visit
Admission: RE | Admit: 2016-11-17 | Discharge: 2016-11-17 | Disposition: A | Discharge status: Home / Self Care | Payer: PPO | Source: Ambulatory Visit | Attending: Internal Medicine | Admitting: Internal Medicine

## 2016-11-17 ENCOUNTER — Other Ambulatory Visit: Payer: Self-pay | Admitting: Internal Medicine

## 2016-11-17 DIAGNOSIS — N6322 Unspecified lump in the left breast, upper inner quadrant: Secondary | ICD-10-CM | POA: Diagnosis not present

## 2016-11-17 DIAGNOSIS — N63 Unspecified lump in unspecified breast: Secondary | ICD-10-CM

## 2016-11-17 DIAGNOSIS — R922 Inconclusive mammogram: Secondary | ICD-10-CM | POA: Diagnosis not present

## 2016-11-17 DIAGNOSIS — N6321 Unspecified lump in the left breast, upper outer quadrant: Secondary | ICD-10-CM | POA: Diagnosis not present

## 2016-11-18 DIAGNOSIS — Z23 Encounter for immunization: Secondary | ICD-10-CM | POA: Diagnosis not present

## 2016-11-18 DIAGNOSIS — D485 Neoplasm of uncertain behavior of skin: Secondary | ICD-10-CM | POA: Diagnosis not present

## 2016-11-18 DIAGNOSIS — D0471 Carcinoma in situ of skin of right lower limb, including hip: Secondary | ICD-10-CM | POA: Diagnosis not present

## 2016-11-20 ENCOUNTER — Ambulatory Visit
Admission: RE | Admit: 2016-11-20 | Discharge: 2016-11-20 | Disposition: A | Discharge status: Home / Self Care | Payer: PPO | Source: Ambulatory Visit | Attending: Internal Medicine | Admitting: Internal Medicine

## 2016-11-20 ENCOUNTER — Other Ambulatory Visit: Payer: Self-pay | Admitting: Internal Medicine

## 2016-11-20 DIAGNOSIS — N63 Unspecified lump in unspecified breast: Secondary | ICD-10-CM

## 2016-11-20 DIAGNOSIS — N6320 Unspecified lump in the left breast, unspecified quadrant: Secondary | ICD-10-CM

## 2016-11-20 DIAGNOSIS — C50812 Malignant neoplasm of overlapping sites of left female breast: Secondary | ICD-10-CM | POA: Diagnosis not present

## 2016-11-20 DIAGNOSIS — N6321 Unspecified lump in the left breast, upper outer quadrant: Secondary | ICD-10-CM | POA: Diagnosis not present

## 2016-11-20 DIAGNOSIS — N6322 Unspecified lump in the left breast, upper inner quadrant: Secondary | ICD-10-CM | POA: Diagnosis not present

## 2016-11-23 ENCOUNTER — Telehealth: Payer: Self-pay | Admitting: Oncology

## 2016-11-23 ENCOUNTER — Encounter: Payer: Self-pay | Admitting: *Deleted

## 2016-11-23 ENCOUNTER — Encounter: Payer: Self-pay | Admitting: Oncology

## 2016-11-23 NOTE — Telephone Encounter (Signed)
Spoke with patient to confirm afternoon appointment for [facility] on December 02, 2016. Paperwork will be mailed to patient

## 2016-11-26 DIAGNOSIS — C801 Malignant (primary) neoplasm, unspecified: Secondary | ICD-10-CM

## 2016-11-26 HISTORY — DX: Malignant (primary) neoplasm, unspecified: C80.1

## 2016-11-30 DIAGNOSIS — Z1211 Encounter for screening for malignant neoplasm of colon: Secondary | ICD-10-CM | POA: Diagnosis not present

## 2016-11-30 DIAGNOSIS — M79644 Pain in right finger(s): Secondary | ICD-10-CM | POA: Diagnosis not present

## 2016-12-01 ENCOUNTER — Encounter: Payer: Self-pay | Admitting: *Deleted

## 2016-12-01 ENCOUNTER — Other Ambulatory Visit: Payer: Self-pay | Admitting: *Deleted

## 2016-12-01 DIAGNOSIS — C50412 Malignant neoplasm of upper-outer quadrant of left female breast: Secondary | ICD-10-CM | POA: Insufficient documentation

## 2016-12-01 DIAGNOSIS — Z170 Estrogen receptor positive status [ER+]: Principal | ICD-10-CM

## 2016-12-01 NOTE — Progress Notes (Signed)
[place]  [phone] Fax:([phone]     ID: [name] DOB: 07-05-1940  MR#: [id number]  XNA#:[id number]  Patient Care Team: Alyssa Shirts, MD as PCP - General (Internal Medicine) [name], Alyssa Dad, MD as Consulting Physician (Oncology) Alyssa Kaufman, MD as Consulting Physician (Orthopedic Surgery) Alyssa Craver, MD as Consulting Physician (Gastroenterology) Alyssa Pigg, MD as Consulting Physician (Dermatology) Alyssa Rhodes, MD as Consulting Physician (Urology) Alyssa Rudd, MD as Consulting Physician (Radiation Oncology) Alyssa Bookbinder, MD as Consulting Physician (General Surgery) OTHER MD:  CHIEF COMPLAINT: estrogen receptor positive breast cancer  CURRENT TREATMENT: awaiting definitive surgery   HISTORY OF CURRENT ILLNESS:  [name] initially palpated a lump to her left breast while watching TV which she brought to medical attention and was evaluated 1 week following by her PCP, Dr. Coralyn Hernandez. She then had an unilateral diagnostic left mammography with tomography and CAD and left breast ultrasonography at [facility] on 11/17/2016 showing a 1.6 cm mass in the upper left breast suspicous for malignancy. The left axilla was benign.  Accordingly on 11/20/2016, she proceeded to biopsy of the left breast area in question. The pathology from this procedure showed ([id number]): invasive mammary carcinoma. Prognostic indicators: ER: 100% positive; PR: 5% positive; Both with strong staining intensity. Proliferation marker Ki67: 15% ; HER-2: negative.   The patient's subsequent history is as detailed below.  INTERVAL HISTORY: [name] was evaluated in the multidisciplinary breast clinic on 12/02/2016 accompanied by her daughter [name].. Her case was also presented at the multidisciplinary breast cancer conference on the same day. At that time a preliminary plan was proposed: breast conserving surgery, oncotype, consider adjuvant  radiation, anti-estrogens  REVIEW OF SYSTEMS: [name] reports that she has a history of acid reflux that she treats with Prilosec. She notes that she had a prior history of migraines as a young adult that have since resolved. As far as surgeries, she has had a right knee replacement, tonsillectomy, and partial hysterectomy. She still has her ovaries. She reports that she still has her appendix. She denies seizures, strokes, cataracts, glaucoma, trouble swallowing, asthma, emphysema, MI, heart murmur, or palpitations at this time. She has an appointment with her Urologist, [name] on 12/09/2016 due to bilateral kidney stones. She has an orthopedist, Dr. Amedeo Hernandez whom she is seeing her right hand arthritic joints following her recent right hand surgery. She notes that things have been well with her right hand since the surgery. She denies unusual headaches, visual changes, nausea, vomiting, or dizziness. There has been no unusual cough, phlegm production, or pleurisy. This been no change in bowel or bladder habits. She denies unexplained fatigue or unexplained weight loss, bleeding, rash, or fever. A detailed review of systems was otherwise stable.    PAST MEDICAL HISTORY: Past Medical History:  Diagnosis Date  . Arthritis    knees  . Depression   . Diverticulitis   . Diverticulosis    bleeding  . GERD (gastroesophageal reflux disease)   . Hypertension   . Hyperthyroidism    nodule on thyroid, Radioactive, now hypo  . Hypothyroidism, iatrogenic    After RI now hypo on synthroid    PAST SURGICAL HISTORY: Past Surgical History:  Procedure Laterality Date  . ABDOMINAL HYSTERECTOMY  1977  . BLADDER SURGERY     bladder suspension  . JOINT REPLACEMENT Right 2009   total knee replacement  . ROTATOR CUFF REPAIR Left     FAMILY HISTORY Family History  Problem Relation Age of  Onset  . Diverticulitis Mother   . Colon cancer Maternal Grandmother   . Heart attack Father   . Breast cancer  Daughter    Her father died at age 25 from heart failure. Her mother died at age 29 from Alzheimer's. She has 1 sister and no brothers. Her maternal grandmother had colon cancer and she is unsure of what age she was diagnosed with colon cancer. Her father was diagnosed with skin cancer in his late 46's, and she notes that he didn't have melanoma. Her daughter had DCIS breast cancer diagnosed at 72.She hasn't had genetic testing completed as of yet. Her oldest daughter had genetic testing which came back normal. She has a granddaughter that is 64 years old.   GYNECOLOGIC HISTORY:  No LMP recorded. Patient has had a hysterectomy.  Menarche: 76 years old Age at first live birth: 76 years old GP: GxP3 LMP: Had a hysterectomy Contraceptive: OCP HRT: Yes, Estrogen and Progesterone combination for over 30 years.    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 She has 3 children from her first marriage: [name] age 52 who is Mudlogger of a Location manager in [place]. [name] is 65 in Sales in [place]. [name] is 36 and is a 5th grade teacher in [place].    ADVANCED DIRECTIVES:  Her oldest daughter, [name] is the patient's [place] and can be reached at [phone].   HEALTH MAINTENANCE: Social History   Tobacco Use  . Smoking status: Never Smoker  . Smokeless tobacco: Never Used  Substance Use Topics  . Alcohol use: No  . Drug use: No     Colonoscopy: 2016  PAP:   Bone density: 2017   Allergies  Allergen Reactions  . Amitriptyline Other (See Comments)    hallucinations  . Aspirin Other (See Comments)    Bleeding  . Nsaids Other (See Comments)    Lower GI bleeding  . Demerol [Meperidine] Other (See Comments)    migraines  . Fish Allergy Nausea Only  . Morphine And Related Rash    Current Outpatient Medications  Medication Sig Dispense Refill  .  benazepril-hydrochlorthiazide (LOTENSIN HCT) 20-12.5 MG per tablet Take 1 tablet by mouth daily. 90 tablet 2  . glimepiride (AMARYL) 1 MG tablet Take 1 mg daily with breakfast by mouth.    . Levothyroxine Sodium 100 MCG CAPS Take 100 mcg daily by mouth.   1  . metFORMIN (GLUCOPHAGE) 500 MG tablet Take 500 mg 2 (two) times daily with a meal by mouth.    Marland Kitchen omeprazole (PRILOSEC) 40 MG capsule Take 40 mg daily by mouth.    . rosuvastatin (CRESTOR) 5 MG tablet Take 1 tablet by mouth as directed. Monday Wednesday friday  0   No current facility-administered medications for this visit.     OBJECTIVE: middle aged White woman who appears stated age  54:   12/02/16 1245  BP: (!) 157/73  Pulse: 98  Resp: 18  Temp: 98.8 F (37.1 C)  SpO2: 97%     Body mass index is 32.41 kg/m.   Wt Readings from Last 3 Encounters:  12/02/16 177 lb 3.2 oz (80.4 kg)  03/24/16 172 lb (78 kg)  02/28/16 172 lb 2 oz (78.1 kg)      ECOG FS:1 - Symptomatic but completely ambulatory  Ocular: Sclerae unicteric, pupils round and equal Ear-nose-throat: Oropharynx clear and moist  Lymphatic: No cervical or supraclavicular adenopathy Lungs no rales or rhonchi Heart regular rate and rhythm Abd soft, obese, nontender, positive bowel sounds MSK no focal spinal tenderness, no joint edema Neuro: non-focal, well-oriented, appropriate affect Breasts: the right breast is benign, the left breast is s/p recent biopsy-- there is a small bruise at the site. Both axillae are benign   LAB RESULTS:  CMP     Component Value Date/Time   NA 140 12/02/2016 1213   K 3.8 12/02/2016 1213   CL 99 (L) 08/10/2015 1125   CO2 24 12/02/2016 1213   GLUCOSE 162 (H) 12/02/2016 1213   BUN 18.8 12/02/2016 1213   CREATININE 1.1 12/02/2016 1213   CALCIUM 9.9 12/02/2016 1213   PROT 7.1 12/02/2016 1213   ALBUMIN 3.7 12/02/2016 1213   AST 15 12/02/2016 1213   ALT 16 12/02/2016 1213   ALKPHOS 89 12/02/2016 1213   BILITOT 0.46  12/02/2016 1213   GFRNONAA 56 (L) 08/10/2015 1125   GFRNONAA 55 (L) 12/13/2013 1209   GFRAA >60 08/10/2015 1125   GFRAA 64 12/13/2013 1209    No results found for: TOTALPROTELP, ALBUMINELP, A1GS, A2GS, BETS, BETA2SER, GAMS, MSPIKE, SPEI  No results found for: KPAFRELGTCHN, LAMBDASER, KAPLAMBRATIO  Lab Results  Component Value Date   WBC 9.0 12/02/2016   NEUTROABS 5.7 12/02/2016   HGB 13.1 12/02/2016   HCT 39.4 12/02/2016   MCV 86.3 12/02/2016   PLT 250 12/02/2016    @LASTCHEMISTRY @  No results found for: LABCA2  No components found for: [id number]  No results for input(s): INR in the last 168 hours.  No results found for: LABCA2  No results found for: IWP809  No results found for: XIP382  No results found for: NKN397  No results found for: CA2729  No components found for: HGQUANT  No results found for: CEA1 / No results found for: CEA1   No results found for: AFPTUMOR  No results found for: CHROMOGRNA  No results found for: PSA1  Appointment on 12/02/2016  Component Date Value Ref Range Status  . Sodium 12/02/2016 140  136 - 145 mEq/L Final  . Potassium 12/02/2016 3.8  3.5 - 5.1 mEq/L Final  . Chloride 12/02/2016 103  98 - 109 mEq/L Final  . CO2 12/02/2016 24  22 - 29 mEq/L Final  . Glucose 12/02/2016 162* 70 - 140 mg/dl Final   Glucose reference range is for nonfasting patients. Fasting glucose reference range is 70- 100.  Marland Kitchen BUN 12/02/2016 18.8  7.0 - 26.0 mg/dL Final  . Creatinine 12/02/2016 1.1  0.6 - 1.1 mg/dL Final  . Total Bilirubin 12/02/2016 0.46  0.20 - 1.20 mg/dL Final  . Alkaline Phosphatase 12/02/2016 89  40 - 150 U/L Final  . AST 12/02/2016 15  5 - 34 U/L Final  . ALT 12/02/2016 16  0 - 55 U/L Final  . Total Protein 12/02/2016 7.1  6.4 - 8.3 g/dL Final  . Albumin 12/02/2016 3.7  3.5 - 5.0 g/dL Final  . Calcium 12/02/2016 9.9  8.4 - 10.4 mg/dL Final  . Anion Gap 12/02/2016 13* 3 - 11 mEq/L Final  . EGFR 12/02/2016 48* >60 ml/min/1.73 m2  Final   eGFR is calculated using the CKD-EPI Creatinine Equation (2009)  . WBC 12/02/2016 9.0  3.9 - 10.3 10e3/uL Final  . NEUT# 12/02/2016 5.7  1.5 - 6.5 10e3/uL Final  . HGB 12/02/2016 13.1  11.6 - 15.9 g/dL Final  . HCT 12/02/2016 39.4  34.8 - 46.6 %  Final  . Platelets 12/02/2016 250  145 - 400 10e3/uL Final  . MCV 12/02/2016 86.3  79.5 - 101.0 fL Final  . MCH 12/02/2016 28.7  25.1 - 34.0 pg Final  . MCHC 12/02/2016 33.3  31.5 - 36.0 g/dL Final  . RBC 12/02/2016 4.56  3.70 - 5.45 10e6/uL Final  . RDW 12/02/2016 15.1* 11.2 - 14.5 % Final  . lymph# 12/02/2016 2.5  0.9 - 3.3 10e3/uL Final  . MONO# 12/02/2016 0.5  0.1 - 0.9 10e3/uL Final  . Eosinophils Absolute 12/02/2016 0.2  0.0 - 0.5 10e3/uL Final  . Basophils Absolute 12/02/2016 0.1  0.0 - 0.1 10e3/uL Final  . NEUT% 12/02/2016 63.9  38.4 - 76.8 % Final  . LYMPH% 12/02/2016 27.9  14.0 - 49.7 % Final  . MONO% 12/02/2016 5.7  0.0 - 14.0 % Final  . EOS% 12/02/2016 1.8  0.0 - 7.0 % Final  . BASO% 12/02/2016 0.7  0.0 - 2.0 % Final    (this displays the last labs from the last 3 days)  No results found for: TOTALPROTELP, ALBUMINELP, A1GS, A2GS, BETS, BETA2SER, GAMS, MSPIKE, SPEI (this displays SPEP labs)  No results found for: KPAFRELGTCHN, LAMBDASER, KAPLAMBRATIO (kappa/lambda light chains)  No results found for: HGBA, HGBA2QUANT, HGBFQUANT, HGBSQUAN (Hemoglobinopathy evaluation)   No results found for: LDH  No results found for: IRON, TIBC, IRONPCTSAT (Iron and TIBC)  No results found for: FERRITIN  Urinalysis    Component Value Date/Time   COLORURINE YELLOW 04/04/2015 [place] 04/04/2015 1347   LABSPEC 1.019 04/04/2015 1347   PHURINE 6.0 04/04/2015 1347   GLUCOSEU NEGATIVE 04/04/2015 1347   [place] 04/04/2015 1347   [place] 04/04/2015 1347   BILIRUBINUR neg 12/13/2013 1122   KETONESUR NEGATIVE 04/04/2015 1347   PROTEINUR NEGATIVE 04/04/2015 1347   UROBILINOGEN negative  12/13/2013 1122   UROBILINOGEN 0.2 12/08/2010 1156   NITRITE NEGATIVE 04/04/2015 1347   [place] 04/04/2015 1347     STUDIES: US Breast Ltd Uni Left Inc Axilla  Result Date: 11/17/2016 CLINICAL DATA:  76 year old female with a left breast palpable abnormality. The patient states she has been feeling this for approximately 3 weeks. EXAM: 2D DIGITAL DIAGNOSTIC UNILATERAL LEFT MAMMOGRAM WITH CAD AND ADJUNCT TOMO LEFT BREAST ULTRASOUND COMPARISON:  Previous exam(s). ACR Breast Density Category c: The breast tissue is heterogeneously dense, which may obscure small masses. FINDINGS: Cc and MLO tomograms as well as spot compression tomograms were performed of the left breast. There is a mass with obscured margins corresponding to the area of palpable concern in the upper left breast measuring approximately 1.6 cm. Mammographic images were processed with CAD. Physical examination at site of palpable concern in the upper left breast reveals a firm mass at the approximate 12 o'clock position. Targeted ultrasound of the left breast was performed demonstrating an irregular hypoechoic mass at the 12 o'clock position 3 cm from nipple measuring 1.4 x 0.9 x 1.6 cm. This corresponds well with the mass seen at mammography. No lymphadenopathy seen in the left axilla. IMPRESSION: Suspicious palpable mass in the left breast. RECOMMENDATION: Ultrasound-guided biopsy of the mass in the left breast is recommended. This is being scheduled for the patient. I have discussed the findings and recommendations with the patient. Results were also provided in writing at the conclusion of the visit. If applicable, a reminder letter will be sent to the patient regarding the next appointment. BI-RADS CATEGORY  4: Suspicious. Electronically Signed   By: Anderson Malta  Shelly Bombard M.D.   On: 11/17/2016 13:40   Mm Diag Breast Tomo Uni Left  Result Date: 11/17/2016 CLINICAL DATA:  76 year old female with a left breast palpable  abnormality. The patient states she has been feeling this for approximately 3 weeks. EXAM: 2D DIGITAL DIAGNOSTIC UNILATERAL LEFT MAMMOGRAM WITH CAD AND ADJUNCT TOMO LEFT BREAST ULTRASOUND COMPARISON:  Previous exam(s). ACR Breast Density Category c: The breast tissue is heterogeneously dense, which may obscure small masses. FINDINGS: Cc and MLO tomograms as well as spot compression tomograms were performed of the left breast. There is a mass with obscured margins corresponding to the area of palpable concern in the upper left breast measuring approximately 1.6 cm. Mammographic images were processed with CAD. Physical examination at site of palpable concern in the upper left breast reveals a firm mass at the approximate 12 o'clock position. Targeted ultrasound of the left breast was performed demonstrating an irregular hypoechoic mass at the 12 o'clock position 3 cm from nipple measuring 1.4 x 0.9 x 1.6 cm. This corresponds well with the mass seen at mammography. No lymphadenopathy seen in the left axilla. IMPRESSION: Suspicious palpable mass in the left breast. RECOMMENDATION: Ultrasound-guided biopsy of the mass in the left breast is recommended. This is being scheduled for the patient. I have discussed the findings and recommendations with the patient. Results were also provided in writing at the conclusion of the visit. If applicable, a reminder letter will be sent to the patient regarding the next appointment. BI-RADS CATEGORY  4: Suspicious. Electronically Signed   By: Everlean Alstrom M.D.   On: 11/17/2016 13:40   Mm Clip Placement Left  Result Date: 11/20/2016 CLINICAL DATA:  Post biopsy mammogram of the left breast for clip placement. EXAM: DIAGNOSTIC LEFT MAMMOGRAM POST ULTRASOUND BIOPSY COMPARISON:  Previous exam(s). FINDINGS: Mammographic images were obtained following ultrasound guided biopsy of a left breast mass at 12 o'clock. The ribbon shaped biopsy marking clip is appropriately positioned in  the superior left breast at the intended site of biopsy. IMPRESSION: Appropriate positioning of the ribbon shaped biopsy marking clip within the mass at 12 o'clock in the left breast. Final Assessment: Post Procedure Mammograms for Marker Placement Electronically Signed   By: Ammie Ferrier M.D.   On: 11/20/2016 16:48   [place] Lt Breast Bx W Loc Dev 1st Lesion Img Bx Spec US Guide  Addendum Date: 11/30/2016   ADDENDUM REPORT: 11/23/2016 13:51 ADDENDUM: Pathology revealed GRADE I INVASIVE MAMMARY CARCINOMA of the Left breast, 12:00 o'clock. This was found to be concordant by Dr. Ammie Ferrier. Pathology results were discussed with the patient by telephone. The patient reported doing well after the biopsy with tenderness at the site. Post biopsy instructions and care were reviewed and questions were answered. The patient was encouraged to call [facility] for any additional concerns. The patient was referred to [facility] on December 02, 2016. Pathology results reported by Terie Purser, RN on 11/23/2016. Electronically Signed   By: Ammie Ferrier M.D.   On: 11/23/2016 13:51   Result Date: 11/30/2016 CLINICAL DATA:  76 year old female presenting for biopsy of a left breast mass. EXAM: ULTRASOUND GUIDED LEFT BREAST CORE NEEDLE BIOPSY COMPARISON:  Previous exam(s). FINDINGS: I met with the patient and we discussed the procedure of ultrasound-guided biopsy, including benefits and alternatives. We discussed the high likelihood of a successful procedure. We discussed the risks of the procedure, including infection, bleeding, tissue injury,  clip migration, and inadequate sampling. Informed written consent was given. The usual time-out protocol was performed immediately prior to the procedure. Lesion quadrant: Upper inner quadrant Using sterile technique and 1% Lidocaine as local anesthetic, under direct  ultrasound visualization, a 14 gauge spring-loaded device was used to perform biopsy of biopsy of a left breast mass slightly medial to 12 o'clock using a an inferolateral approach. At the conclusion of the procedure a ribbon shaped tissue marker clip was deployed into the biopsy cavity. Follow up 2 view mammogram was performed and dictated separately. IMPRESSION: Ultrasound guided biopsy of breast mass slightly medial to the 12 o'clock position. No apparent complications. Electronically Signed: By: Ammie Ferrier M.D. On: 11/20/2016 16:50    ELIGIBLE FOR AVAILABLE RESEARCH PROTOCOL: no  ASSESSMENT: 76 y.o. [place] woman s/p left breast upper outer quadrant biopsy 11/20/2016 for a clinical T1c N0, stage IA invasive ductal carcinoma, grade 1, estrogen and progesterone receptor positive, HER-2 not amplified, with an Mib-1 of 15%  (1) brest conserving surgery with SLN sampling pending  (2) oncotype to be obtained from the final surgical specimen: chemotherapy not anticipated  (3) consider adjuvant radiation  (4) anti-estrogens to follow at the completion of local therapy  PLAN: We spent the better part of today's hour-long appointment discussing the biology of her diagnosis and the specifics of her situation. We first reviewed the fact that cancer is not one disease but more than 100 different diseases and that it is important to keep them separate-- otherwise when friends and relatives discuss their own cancer experiences with [place] confusion can result. Similarly we explained that if breast cancer spreads to the bone or liver, the patient would not have bone cancer or liver cancer, but breast cancer in the bone and breast cancer in the liver: one cancer in three places-- not 3 different cancers which otherwise would have to be treated in 3 different ways.  We discussed the difference between local and systemic therapy. In terms of loco-regional treatment, lumpectomy plus radiation is equivalent  to mastectomy as far as survival is concerned. For this reason, and because the cosmetic results are generally superior, we recommend breast conserving surgery.   We then discussed the rationale for systemic therapy. There is some risk that this cancer may have already spread to other parts of her body. Patients frequently ask at this point about bone scans, CAT scans and PET scans to find out if they have occult breast cancer somewhere else. The problem is that in early stage disease we are much more likely to find false positives then true cancers and this would expose the patient to unnecessary procedures as well as unnecessary radiation. Scans cannot answer the question the patient really would like to know, which is whether she has microscopic disease elsewhere in her body. For those reasons we do not recommend them.  Of course we would proceed to aggressive evaluation of any symptoms that might suggest metastatic disease, but that is not the case here.  Next we went over the options for systemic therapy which are anti-estrogens, anti-HER-2 immunotherapy, and chemotherapy. Macaria does not meet criteria for anti-HER-2 immunotherapy. She is a good candidate for anti-estrogens.  The question of chemotherapy is more complicated. Chemotherapy is most effective in rapidly growing, aggressive tumors. It is much less effective in low-grade, slow growing cancers, like [place] 's. For that reason we are going to request an Oncotype from the definitive surgical sample, as suggested by NCCN guidelines. That will help [place]  make a definitive decision regarding chemotherapy in this case.  The patient's daughter wondered if BSO was indicated-- we discussed that there is no indication in this setting in the absence of a documented genetic mutation.   The plan then is for surgery, with oncotype likely to come back low risk (we will call patient with results) then consideration of radiation vs anti-strogens  alone.  Holliday has a good understanding of the overall plan. She agrees with it. She knows the goal of treatment in her case is cure. She will call with any problems that may develop before her next visit here.  Milan Clare, Alyssa Dad, MD  12/02/16 3:52 PM Medical Oncology and Hematology [facility] [address] [place], [place] [address] Tel. [phone]    Fax. [phone]  This document serves as a record of services personally performed by Lurline Del, MD. It was created on his behalf by Sheron Nightingale, a trained medical scribe. The creation of this record is based on the scribe's personal observations and the provider's statements to them.   I have reviewed the above documentation for accuracy and completeness, and I agree with the above.

## 2016-12-02 ENCOUNTER — Other Ambulatory Visit (HOSPITAL_BASED_OUTPATIENT_CLINIC_OR_DEPARTMENT_OTHER): Payer: PPO

## 2016-12-02 ENCOUNTER — Ambulatory Visit: Payer: PPO | Attending: General Surgery | Admitting: Physical Therapy

## 2016-12-02 ENCOUNTER — Ambulatory Visit (HOSPITAL_BASED_OUTPATIENT_CLINIC_OR_DEPARTMENT_OTHER): Payer: PPO | Admitting: Oncology

## 2016-12-02 ENCOUNTER — Ambulatory Visit
Admission: RE | Admit: 2016-12-02 | Discharge: 2016-12-02 | Disposition: A | Discharge status: Home / Self Care | Payer: PPO | Source: Ambulatory Visit | Attending: Radiation Oncology | Admitting: Radiation Oncology

## 2016-12-02 ENCOUNTER — Other Ambulatory Visit: Payer: Self-pay | Admitting: Orthopedic Surgery

## 2016-12-02 ENCOUNTER — Encounter: Payer: Self-pay | Admitting: Physical Therapy

## 2016-12-02 ENCOUNTER — Encounter: Payer: Self-pay | Admitting: Radiation Oncology

## 2016-12-02 ENCOUNTER — Encounter: Payer: Self-pay | Admitting: Oncology

## 2016-12-02 VITALS — BP 157/73 | HR 98 | Temp 98.8°F | Resp 18 | Ht 62.0 in | Wt 177.2 lb

## 2016-12-02 DIAGNOSIS — Z79890 Hormone replacement therapy (postmenopausal): Secondary | ICD-10-CM | POA: Insufficient documentation

## 2016-12-02 DIAGNOSIS — R293 Abnormal posture: Secondary | ICD-10-CM

## 2016-12-02 DIAGNOSIS — Z9049 Acquired absence of other specified parts of digestive tract: Secondary | ICD-10-CM | POA: Insufficient documentation

## 2016-12-02 DIAGNOSIS — Z803 Family history of malignant neoplasm of breast: Secondary | ICD-10-CM | POA: Insufficient documentation

## 2016-12-02 DIAGNOSIS — Z170 Estrogen receptor positive status [ER+]: Secondary | ICD-10-CM | POA: Insufficient documentation

## 2016-12-02 DIAGNOSIS — F329 Major depressive disorder, single episode, unspecified: Secondary | ICD-10-CM | POA: Insufficient documentation

## 2016-12-02 DIAGNOSIS — Z8379 Family history of other diseases of the digestive system: Secondary | ICD-10-CM | POA: Insufficient documentation

## 2016-12-02 DIAGNOSIS — Z885 Allergy status to narcotic agent status: Secondary | ICD-10-CM | POA: Insufficient documentation

## 2016-12-02 DIAGNOSIS — Z510 Encounter for antineoplastic radiation therapy: Secondary | ICD-10-CM | POA: Insufficient documentation

## 2016-12-02 DIAGNOSIS — Z90710 Acquired absence of both cervix and uterus: Secondary | ICD-10-CM | POA: Insufficient documentation

## 2016-12-02 DIAGNOSIS — C50412 Malignant neoplasm of upper-outer quadrant of left female breast: Secondary | ICD-10-CM

## 2016-12-02 DIAGNOSIS — Z79899 Other long term (current) drug therapy: Secondary | ICD-10-CM | POA: Insufficient documentation

## 2016-12-02 DIAGNOSIS — I10 Essential (primary) hypertension: Secondary | ICD-10-CM | POA: Insufficient documentation

## 2016-12-02 DIAGNOSIS — M5136 Other intervertebral disc degeneration, lumbar region: Secondary | ICD-10-CM

## 2016-12-02 DIAGNOSIS — M25612 Stiffness of left shoulder, not elsewhere classified: Secondary | ICD-10-CM | POA: Diagnosis not present

## 2016-12-02 DIAGNOSIS — E890 Postprocedural hypothyroidism: Secondary | ICD-10-CM | POA: Insufficient documentation

## 2016-12-02 DIAGNOSIS — Z8249 Family history of ischemic heart disease and other diseases of the circulatory system: Secondary | ICD-10-CM | POA: Insufficient documentation

## 2016-12-02 DIAGNOSIS — K219 Gastro-esophageal reflux disease without esophagitis: Secondary | ICD-10-CM | POA: Insufficient documentation

## 2016-12-02 DIAGNOSIS — Z886 Allergy status to analgesic agent status: Secondary | ICD-10-CM | POA: Insufficient documentation

## 2016-12-02 DIAGNOSIS — Z91013 Allergy to seafood: Secondary | ICD-10-CM | POA: Insufficient documentation

## 2016-12-02 DIAGNOSIS — M170 Bilateral primary osteoarthritis of knee: Secondary | ICD-10-CM | POA: Insufficient documentation

## 2016-12-02 DIAGNOSIS — Z96651 Presence of right artificial knee joint: Secondary | ICD-10-CM | POA: Insufficient documentation

## 2016-12-02 LAB — COMPREHENSIVE METABOLIC PANEL
ALT: 16 U/L (ref 0–55)
ANION GAP: 13 meq/L — AB (ref 3–11)
AST: 15 U/L (ref 5–34)
Albumin: 3.7 g/dL (ref 3.5–5.0)
Alkaline Phosphatase: 89 U/L (ref 40–150)
BILIRUBIN TOTAL: 0.46 mg/dL (ref 0.20–1.20)
BUN: 18.8 mg/dL (ref 7.0–26.0)
CHLORIDE: 103 meq/L (ref 98–109)
CO2: 24 meq/L (ref 22–29)
Calcium: 9.9 mg/dL (ref 8.4–10.4)
Creatinine: 1.1 mg/dL (ref 0.6–1.1)
EGFR: 48 mL/min/{1.73_m2} — AB (ref >60)
GLUCOSE: 162 mg/dL — AB (ref 70–140)
Potassium: 3.8 mEq/L (ref 3.5–5.1)
SODIUM: 140 meq/L (ref 136–145)
TOTAL PROTEIN: 7.1 g/dL (ref 6.4–8.3)

## 2016-12-02 LAB — CBC WITH DIFFERENTIAL/PLATELET
BASO%: 0.7 % (ref 0.0–2.0)
BASOS ABS: 0.1 10*3/uL (ref 0.0–0.1)
EOS ABS: 0.2 10*3/uL (ref 0.0–0.5)
EOS%: 1.8 % (ref 0.0–7.0)
HEMATOCRIT: 39.4 % (ref 34.8–46.6)
HEMOGLOBIN: 13.1 g/dL (ref 11.6–15.9)
LYMPH#: 2.5 10*3/uL (ref 0.9–3.3)
LYMPH%: 27.9 % (ref 14.0–49.7)
MCH: 28.7 pg (ref 25.1–34.0)
MCHC: 33.3 g/dL (ref 31.5–36.0)
MCV: 86.3 fL (ref 79.5–101.0)
MONO#: 0.5 10*3/uL (ref 0.1–0.9)
MONO%: 5.7 % (ref 0.0–14.0)
NEUT#: 5.7 10*3/uL (ref 1.5–6.5)
NEUT%: 63.9 % (ref 38.4–76.8)
PLATELETS: 250 10*3/uL (ref 145–400)
RBC: 4.56 10*6/uL (ref 3.70–5.45)
RDW: 15.1 % — AB (ref 11.2–14.5)
WBC: 9 10*3/uL (ref 3.9–10.3)

## 2016-12-02 NOTE — Progress Notes (Signed)
Nutrition Assessment  Reason for Assessment:  Pt seen in Breast Clinic  ASSESSMENT:   76 year old female with new diagnosis of breast cancer.  Past medical history reviewed.    Daughter with patient and concerned about patient intake and blood glucose level.  Feels that patient needs to eat more healthy foods  Medications:  reviewed  Labs: reviewed  Anthropometrics:   Height: 62 inches Weight: 177 lb BMI: 32   NUTRITION DIAGNOSIS: Food and nutrition related knowledge deficit related to new diagnosis of breast cancer as evidenced by no prior need for nutrition related information.  INTERVENTION:   Discussed and provided packet of information regarding nutritional tips for breast cancer patients.  Questions answered.  Teachback method used.  Contact information provided and patient knows to contact me with questions/concerns.    MONITORING, EVALUATION, and GOAL: Pt will consume a healthy plant based diet to maintain lean body mass throughout treatment.   Marcene Laskowski B. Zenia Resides, [place], [place] Registered Dietitian [phone] (pager)

## 2016-12-02 NOTE — Patient Instructions (Signed)

## 2016-12-02 NOTE — Progress Notes (Signed)
Radiation Oncology         [phone] ________________________________  Name: [name]        MRN: [id number]  Date of Service: 12/02/2016 DOB: [birth year]  [id number], [name], MD  Rolm Bookbinder, MD     REFERRING PHYSICIAN: Rolm Bookbinder, MD   DIAGNOSIS: The encounter diagnosis was Malignant neoplasm of upper-outer quadrant of left breast in female, estrogen receptor positive ([place]).   HISTORY OF PRESENT ILLNESS: [name] is a 76 y.o. female seen in the multidisciplinary breast clinic for a new diagnosis of left breast cancer. The patient was noted to have a palpable mass in the left breast. Diagnostic imaging revealed a 1.6 x 1.4 x .9 cm at 12:00. Her axilla was negative by ultrasound also. She underwent biopsy on 11/20/16 and final pathology revealed a grade 1 invasive ductal carcinoma, ER/PR positive, HER2 negative, with a Ki 67 of 15%. She comes today to discuss options of treatment for her breast cancer.    PREVIOUS RADIATION THERAPY: No   PAST MEDICAL HISTORY:  Past Medical History:  Diagnosis Date  . Arthritis    knees  . Depression   . Diverticulitis   . Diverticulosis    bleeding  . GERD (gastroesophageal reflux disease)   . Hypertension   . Hyperthyroidism    nodule on thyroid, Radioactive, no hypo  . Hypothyroidism, iatrogenic    After RI now hypo on synthroid       PAST SURGICAL HISTORY: Past Surgical History:  Procedure Laterality Date  . ABDOMINAL HYSTERECTOMY  1977  . BLADDER SURGERY     bladder suspension  . JOINT REPLACEMENT Right 2009   total knee replacement  . ROTATOR CUFF REPAIR Left      FAMILY HISTORY:  Family History  Problem Relation Age of Onset  . Diverticulitis Mother   . Colon cancer Maternal Grandmother   . Cancer Maternal Grandmother        colon  . Heart attack Father   . Cancer Daughter        breast  . Breast cancer Daughter      SOCIAL HISTORY:  reports that  has never smoked. she has  never used smokeless tobacco. She reports that she does not drink alcohol or use drugs. The patient is married and lives in [place]. She's a retired Theatre stage manager.   ALLERGIES: Amitriptyline; Aspirin; Nsaids; Demerol [meperidine]; Fish allergy; and Morphine and related   MEDICATIONS:  Current Outpatient Medications  Medication Sig Dispense Refill  . benazepril-hydrochlorthiazide (LOTENSIN HCT) 20-12.5 MG per tablet Take 1 tablet by mouth daily. 90 tablet 2  . Cholecalciferol (VITAMIN D3) 5000 UNITS CAPS Take by mouth daily.     Marland Kitchen escitalopram (LEXAPRO) 10 MG tablet Take 10 mg by mouth daily.  1  . FOLIC ACID PO Take by mouth.    . levothyroxine (SYNTHROID, LEVOTHROID) 88 MCG tablet Take 88 mcg by mouth daily.  1  . Multiple Vitamins-Minerals (HAIR/SKIN/NAILS PO) Take 1 tablet by mouth daily.    . rosuvastatin (CRESTOR) 5 MG tablet Take 1 tablet by mouth as directed. Monday Wednesday friday  0  . vitamin B-12 (CYANOCOBALAMIN) 100 MCG tablet daily.      No current facility-administered medications for this encounter.      REVIEW OF SYSTEMS: On review of systems, the patient reports that she is doing well overall. She denies any chest pain, shortness of breath, cough, fevers, chills, night sweats, unintended weight changes. She denies any  bowel or bladder disturbances, and denies abdominal pain, nausea or vomiting. She denies any new musculoskeletal or joint aches or pains. A complete review of systems is obtained and is otherwise negative.     PHYSICAL EXAM:  Wt Readings from Last 3 Encounters:  03/24/16 172 lb (78 kg)  02/28/16 172 lb 2 oz (78.1 kg)  04/04/15 158 lb (71.7 kg)   Temp Readings from Last 3 Encounters:  08/10/15 98.3 F (36.8 C) (Oral)  04/05/15 98.5 F (36.9 C) (Oral)  02/15/15 98.2 F (36.8 C) (Oral)   BP Readings from Last 3 Encounters:  10/02/16 (!) 151/75  05/15/16 (!) 164/81  02/28/16 124/74   Pulse Readings from Last 3 Encounters:  10/02/16 92    05/15/16 75  02/28/16 80     In general this is a well appearing caucasian female in no acute distress. She is alert and oriented x4 and appropriate throughout the examination. HEENT reveals that the patient is normocephalic, atraumatic. EOMs are intact. PERRLA. Skin is intact without any evidence of gross lesions. Cardiovascular exam reveals a regular rate and rhythm, no clicks rubs or murmurs are auscultated. Chest is clear to auscultation bilaterally. Lymphatic assessment is performed and does not reveal any adenopathy in the cervical, supraclavicular, axillary, or inguinal chains. Bilateral breast exam is performed and reveals fullness deep to her left biops site. No additional masses are noted, but dense tissue is noted bilaterally. Abdomen has active bowel sounds in all quadrants and is intact. The abdomen is soft, non tender, non distended. Lower extremities are negative for pretibial pitting edema, deep calf tenderness, cyanosis or clubbing.   ECOG = 0  0 - Asymptomatic (Fully active, able to carry on all predisease activities without restriction)  1 - Symptomatic but completely ambulatory (Restricted in physically strenuous activity but ambulatory and able to carry out work of a light or sedentary nature. For example, light housework, office work)  2 - Symptomatic, <50% in bed during the day (Ambulatory and capable of all self care but unable to carry out any work activities. Up and about more than 50% of waking hours)  3 - Symptomatic, >50% in bed, but not bedbound (Capable of only limited self-care, confined to bed or chair 50% or more of waking hours)  4 - Bedbound (Completely disabled. Cannot carry on any self-care. Totally confined to bed or chair)  5 - Death   Eustace Pen MM, Creech RH, Tormey DC, et al. [phone]). "Toxicity and response criteria of the [facility] Group". [place] Oncol. 5 (6): 649-55    LABORATORY DATA:  Lab Results  Component Value Date    WBC 9.0 08/10/2015   HGB 13.4 08/10/2015   HCT 39.1 08/10/2015   MCV 89.7 08/10/2015   PLT 238 08/10/2015   Lab Results  Component Value Date   NA 138 08/10/2015   K 3.5 08/10/2015   CL 99 (L) 08/10/2015   CO2 30 08/10/2015   Lab Results  Component Value Date   ALT 14 04/05/2015   AST 14 (L) 04/05/2015   ALKPHOS 52 04/05/2015   BILITOT 0.9 04/05/2015      RADIOGRAPHY: US Breast Ltd Uni Left Inc Axilla  Result Date: 11/17/2016 CLINICAL DATA:  76 year old female with a left breast palpable abnormality. The patient states she has been feeling this for approximately 3 weeks. EXAM: 2D DIGITAL DIAGNOSTIC UNILATERAL LEFT MAMMOGRAM WITH CAD AND ADJUNCT TOMO LEFT BREAST ULTRASOUND COMPARISON:  Previous exam(s). ACR Breast Density Category c: The  breast tissue is heterogeneously dense, which may obscure small masses. FINDINGS: Cc and MLO tomograms as well as spot compression tomograms were performed of the left breast. There is a mass with obscured margins corresponding to the area of palpable concern in the upper left breast measuring approximately 1.6 cm. Mammographic images were processed with CAD. Physical examination at site of palpable concern in the upper left breast reveals a firm mass at the approximate 12 o'clock position. Targeted ultrasound of the left breast was performed demonstrating an irregular hypoechoic mass at the 12 o'clock position 3 cm from nipple measuring 1.4 x 0.9 x 1.6 cm. This corresponds well with the mass seen at mammography. No lymphadenopathy seen in the left axilla. IMPRESSION: Suspicious palpable mass in the left breast. RECOMMENDATION: Ultrasound-guided biopsy of the mass in the left breast is recommended. This is being scheduled for the patient. I have discussed the findings and recommendations with the patient. Results were also provided in writing at the conclusion of the visit. If applicable, a reminder letter will be sent to the patient regarding the next  appointment. BI-RADS CATEGORY  4: Suspicious. Electronically Signed   By: Everlean Alstrom M.D.   On: 11/17/2016 13:40   Mm Diag Breast Tomo Uni Left  Result Date: 11/17/2016 CLINICAL DATA:  76 year old female with a left breast palpable abnormality. The patient states she has been feeling this for approximately 3 weeks. EXAM: 2D DIGITAL DIAGNOSTIC UNILATERAL LEFT MAMMOGRAM WITH CAD AND ADJUNCT TOMO LEFT BREAST ULTRASOUND COMPARISON:  Previous exam(s). ACR Breast Density Category c: The breast tissue is heterogeneously dense, which may obscure small masses. FINDINGS: Cc and MLO tomograms as well as spot compression tomograms were performed of the left breast. There is a mass with obscured margins corresponding to the area of palpable concern in the upper left breast measuring approximately 1.6 cm. Mammographic images were processed with CAD. Physical examination at site of palpable concern in the upper left breast reveals a firm mass at the approximate 12 o'clock position. Targeted ultrasound of the left breast was performed demonstrating an irregular hypoechoic mass at the 12 o'clock position 3 cm from nipple measuring 1.4 x 0.9 x 1.6 cm. This corresponds well with the mass seen at mammography. No lymphadenopathy seen in the left axilla. IMPRESSION: Suspicious palpable mass in the left breast. RECOMMENDATION: Ultrasound-guided biopsy of the mass in the left breast is recommended. This is being scheduled for the patient. I have discussed the findings and recommendations with the patient. Results were also provided in writing at the conclusion of the visit. If applicable, a reminder letter will be sent to the patient regarding the next appointment. BI-RADS CATEGORY  4: Suspicious. Electronically Signed   By: Everlean Alstrom M.D.   On: 11/17/2016 13:40   Mm Clip Placement Left  Result Date: 11/20/2016 CLINICAL DATA:  Post biopsy mammogram of the left breast for clip placement. EXAM: DIAGNOSTIC LEFT  MAMMOGRAM POST ULTRASOUND BIOPSY COMPARISON:  Previous exam(s). FINDINGS: Mammographic images were obtained following ultrasound guided biopsy of a left breast mass at 12 o'clock. The ribbon shaped biopsy marking clip is appropriately positioned in the superior left breast at the intended site of biopsy. IMPRESSION: Appropriate positioning of the ribbon shaped biopsy marking clip within the mass at 12 o'clock in the left breast. Final Assessment: Post Procedure Mammograms for Marker Placement Electronically Signed   By: Ammie Ferrier M.D.   On: 11/20/2016 16:48   [place] Lt Breast Bx W Loc Dev 1st Lesion Img Bx  Spec US Guide  Addendum Date: 11/30/2016   ADDENDUM REPORT: 11/23/2016 13:51 ADDENDUM: Pathology revealed GRADE I INVASIVE MAMMARY CARCINOMA of the Left breast, 12:00 o'clock. This was found to be concordant by Dr. Ammie Ferrier. Pathology results were discussed with the patient by telephone. The patient reported doing well after the biopsy with tenderness at the site. Post biopsy instructions and care were reviewed and questions were answered. The patient was encouraged to call [facility] for any additional concerns. The patient was referred to [facility] on December 02, 2016. Pathology results reported by Terie Purser, RN on 11/23/2016. Electronically Signed   By: Ammie Ferrier M.D.   On: 11/23/2016 13:51   Result Date: 11/30/2016 CLINICAL DATA:  76 year old female presenting for biopsy of a left breast mass. EXAM: ULTRASOUND GUIDED LEFT BREAST CORE NEEDLE BIOPSY COMPARISON:  Previous exam(s). FINDINGS: I met with the patient and we discussed the procedure of ultrasound-guided biopsy, including benefits and alternatives. We discussed the high likelihood of a successful procedure. We discussed the risks of the procedure, including infection, bleeding, tissue injury, clip migration, and  inadequate sampling. Informed written consent was given. The usual time-out protocol was performed immediately prior to the procedure. Lesion quadrant: Upper inner quadrant Using sterile technique and 1% Lidocaine as local anesthetic, under direct ultrasound visualization, a 14 gauge spring-loaded device was used to perform biopsy of biopsy of a left breast mass slightly medial to 12 o'clock using a an inferolateral approach. At the conclusion of the procedure a ribbon shaped tissue marker clip was deployed into the biopsy cavity. Follow up 2 view mammogram was performed and dictated separately. IMPRESSION: Ultrasound guided biopsy of breast mass slightly medial to the 12 o'clock position. No apparent complications. Electronically Signed: By: Ammie Ferrier M.D. On: 11/20/2016 16:50       IMPRESSION/PLAN: 1. Stage IA, [id number] grade 1, ER/ PR positive invasive ductal carcinoma of the left breast. Dr. Lisbeth Renshaw discusses the pathology findings and reviews the nature of invasive breast disease. The consensus from the breast conference include breast conservation with lumpectomy with  sentinel mapping. Her tumor will be tested for oncotype dx score to determine a role for systemic therapy. Provided that chemotherapy is not indicated, the patient's course would then be followed by external radiotherapy to the breast followed by antiestrogen therapy. We discussed the risks, benefits, short, and long term effects of radiotherapy, and the patient is interested in proceeding. Dr. Lisbeth Renshaw discusses the delivery and logistics of radiotherapy and anticipates a course of 4 weeks with deep inspiration breath hold technique. We will see her back about 2 weeks after surgery to move forward with the simulation and planning process and anticipate starting radiotherapy about 4 weeks after surgery.    The above documentation reflects my direct findings during this shared patient visit. Please see the separate note by Dr. Lisbeth Renshaw on  this date for the remainder of the patient's plan of care.    Carola Rhine, PAC

## 2016-12-02 NOTE — Therapy (Signed)
[place], [place], [address] Phone: [phone]   Fax:  [phone]  Physical Therapy Evaluation  Patient Details  Name: [name] MRN: [id number] Date of Birth: 1940-10-24 Referring Provider: Dr. Mauricio Po   Encounter Date: 12/02/2016  PT End of Session - 12/02/16 2207    Visit Number  1    Number of Visits  2    Date for PT Re-Evaluation  01/27/17    PT Start Time  1440    PT Stop Time  1509    PT Time Calculation (min)  29 min    Activity Tolerance  Patient tolerated treatment well    Behavior During Therapy  [facility] for tasks assessed/performed       Past Medical History:  Diagnosis Date  . Arthritis    knees  . Depression   . Diverticulitis   . Diverticulosis    bleeding  . GERD (gastroesophageal reflux disease)   . Hypertension   . Hyperthyroidism    nodule on thyroid, Radioactive, now hypo  . Hypothyroidism, iatrogenic    After RI now hypo on synthroid    Past Surgical History:  Procedure Laterality Date  . ABDOMINAL HYSTERECTOMY  1977  . BLADDER SURGERY     bladder suspension  . JOINT REPLACEMENT Right 2009   total knee replacement  . ROTATOR CUFF REPAIR Left     There were no vitals filed for this visit.   Subjective Assessment - 12/02/16 2159    Subjective  Patient reports she is here today to be seen by her medical team for her newly diagnoed left breast cancer.    Patient is accompained by:  Family member    Pertinent History  Patient was diagnosed 11/17/16 with left grade 1 invasive ductal carcinoma breast cancer. It is ER/PR positive and HER2 negative with a Ki67 of 15%. It measure 1.6 cm and is located in the upper outer quadrant. She had a left rotator cuff repair in 2006 and has some residual deficits from that.    Patient Stated Goals  Reduce lymphedema risk and learn post op shoulder ROM HEP    Currently in Pain?  Yes    Pain Score  4     Pain  Location  Back    Pain Orientation  Lower;Left    Pain Descriptors / Indicators  Burning    Pain Type  Chronic pain    Pain Radiating Towards  Left lateral leg    Pain Onset  More than a month ago    Pain Frequency  Intermittent    Aggravating Factors   Sitting    Pain Relieving Factors  Laying down    Multiple Pain Sites  No         [facility] PT Assessment - 12/02/16 0001      Assessment   Medical Diagnosis  Left breast cancer    Referring Provider  Dr. Mauricio Po    Onset Date/Surgical Date  11/17/16    Hand Dominance  Right    Prior Therapy  none      Precautions   Precautions  Other (comment)    Precaution Comments  active cancer      Restrictions   Weight Bearing Restrictions  No      Balance Screen   Has the patient fallen in the past 6 months  No    Has the patient had a decrease in activity level because of a fear of  falling?   No    Is the patient reluctant to leave their home because of a fear of falling?   No      Home Film/video editor residence    Living Arrangements  Spouse/significant other    Available Help at Discharge  Family      Prior Function   Level of [place]  Retired    Leisure  She does not exercise      Cognition   Overall Cognitive Status  Within Functional Limits for tasks assessed      Posture/Postural Control   Posture/Postural Control  Postural limitations    Postural Limitations  Rounded Shoulders;Forward head      ROM / Strength   AROM / PROM / Strength  AROM;Strength      AROM   AROM Assessment Site  Shoulder;Cervical    Right/Left Shoulder  Right;Left    Right Shoulder Extension  43 Degrees    Right Shoulder Flexion  145 Degrees    Right Shoulder ABduction  153 Degrees    Right Shoulder Internal Rotation  65 Degrees    Right Shoulder External Rotation  82 Degrees    Left Shoulder Extension  48 Degrees    Left Shoulder Flexion  112 Degrees Limited by 2006 surgery  per pt report   Limited by 2006 surgery per pt report   Left Shoulder ABduction  141 Degrees    Left Shoulder Internal Rotation  68 Degrees    Left Shoulder External Rotation  72 Degrees    Cervical Flexion  WNL    Cervical Extension  75% limited    Cervical - Right Side Bend  75% limited    Cervical - Left Side Bend  75% limited    Cervical - Right Rotation  75% limited    Cervical - Left Rotation  75% limited      Strength   Overall Strength  Within functional limits for tasks performed        LYMPHEDEMA/ONCOLOGY QUESTIONNAIRE - 12/02/16 2206      Type   Cancer Type  Left breast cancer      Lymphedema Assessments   Lymphedema Assessments  Upper extremities      Right Upper Extremity Lymphedema   10 cm Proximal to Olecranon Process  28.4 cm    Olecranon Process  23.8 cm    10 cm Proximal to Ulnar Styloid Process  22.1 cm    Just Proximal to Ulnar Styloid Process  16 cm    Across Hand at [employer]  17.3 cm    At [place] of 2nd Digit  6.2 cm      Left Upper Extremity Lymphedema   10 cm Proximal to Olecranon Process  29.8 cm    Olecranon Process  24.8 cm    10 cm Proximal to Ulnar Styloid Process  22.4 cm    Just Proximal to Ulnar Styloid Process  15 cm    Across Hand at [employer]  17.4 cm    At [place] of 2nd Digit  6.1 cm          Objective measurements completed on examination: See above findings.           Patient was instructed today in a home exercise program today for post op shoulder range of motion. These included active assist shoulder flexion in sitting, scapular retraction, wall walking with shoulder abduction, and hands behind  head external rotation.  She was encouraged to do these twice a day, holding 3 seconds and repeating 5 times when permitted by her physician.       PT Education - 12/02/16 2207    Education provided  Yes    Education Details  Lymphedema risk reduction and post op shoulder ROM HEP    Person(s) Educated   Patient;Child(ren)    Methods  Explanation;Demonstration;Handout    Comprehension  Returned demonstration;Verbalized understanding           Breast Clinic Goals - 12/02/16 2213      Patient will be able to verbalize understanding of pertinent lymphedema risk reduction practices relevant to her diagnosis specifically related to skin care.   Time  1    Period  Days    Status  Achieved      Patient will be able to return demonstrate and/or verbalize understanding of the post-op home exercise program related to regaining shoulder range of motion.   Time  1    Period  Days    Status  Achieved      Patient will be able to verbalize understanding of the importance of attending the postoperative After Breast Cancer Class for further lymphedema risk reduction education and therapeutic exercise.   Time  1    Period  Days    Status  Achieved       Long Term Clinic Goals - 12/02/16 2213      CC Long Term Goal  #1   Title  Patient will demonstrate she has regained shoulder ROM and function post operatively when reassessed by PT.    Time  8    Period  Weeks    Status  New          Plan - 12/02/16 2208    Clinical Impression Statement  Patient was diagnosed 11/17/16 with left grade 1 invasive ductal carcinoma breast cancer. It is ER/PR positive and HER2 negative with a Ki67 of 15%. It measure 1.6 cm and is located in the upper outer quadrant. She had a left rotator cuff repair in 2006 and has some residual deficits from that. Her multidisciplinary medical team met prior to her assessments to determine a recommended treatment plan. She is planning to have a left lumpectomy and sentinel node biopsy, oncotype testing, radiation, and anti-estrogen therapy.    History and Personal Factors relevant to plan of care:  Previous left rotator cuff repair 2006    Clinical Presentation  Stable    Clinical Decision Making  Low    Rehab Potential  Excellent    Clinical Impairments Affecting Rehab  Potential  None    PT Frequency  -- Eval and 1 f/u visit 3-4 weeks post op   Eval and 1 f/u visit 3-4 weeks post op   PT Treatment/Interventions  ADLs/Self Care Home Management;Therapeutic exercise;Patient/family education    PT Next Visit Plan  Will reassess pt 3-4 weeks post op to determine PT needs    PT Home Exercise Plan  Post op shoulder ROM HEP    Consulted and Agree with Plan of Care  Family member/caregiver;Patient    Family Member Consulted  daughter       Patient will benefit from skilled therapeutic intervention in order to improve the following deficits and impairments:  Pain, Postural dysfunction, Decreased knowledge of precautions, Impaired UE functional use, Decreased range of motion  Visit Diagnosis: Malignant neoplasm of upper-outer quadrant of left breast in female, estrogen receptor positive ([place]) -  Plan: PT plan of care cert/re-cert  Abnormal posture - Plan: PT plan of care cert/re-cert  Stiffness of left shoulder, not elsewhere classified - Plan: PT plan of care cert/re-cert  G-Codes - 82/50/03 2213-04-17    Functional Assessment Tool Used (Outpatient Only)  Clinical Judgement    Functional Limitation  Other PT primary    Other PT Primary Current Status (B0488)  At least 1 percent but less than 20 percent impaired, limited or restricted    Other PT Primary Goal Status (Q9169)  At least 1 percent but less than 20 percent impaired, limited or restricted      Patient will follow up at outpatient cancer rehab 3-4 weeks following surgery.  If the patient requires physical therapy at that time, a specific plan will be dictated and sent to the referring physician for approval. The patient was educated today on appropriate basic range of motion exercises to begin post operatively and the importance of attending the After Breast Cancer class following surgery.  Patient was educated today on lymphedema risk reduction practices as it pertains to recommendations that will benefit the  patient immediately following surgery.  She verbalized good understanding.     Problem List Patient Active Problem List   Diagnosis Date Noted  . Malignant neoplasm of upper-outer quadrant of left breast in female, estrogen receptor positive ([place]) 12/01/2016  . Colitis 04/04/2015  . Acute colitis 04/04/2015  . Depression 01/10/2014  . Hyperlipidemia  pt repeatedly declines statins 12/13/2013  . Post herpetic neuralgia 04/24/2013  . Shingles 04/24/2013  . RUQ abdominal pain 03/28/2013  . Renal calculi 03/27/2013  . Diverticulosis 03/27/2013  . Other postablative hypothyroidism 12/26/2012  . Breast density 12/12/2012  . S/P hysterectomy 11/30/2011  . Multinodular goiter 11/30/2011  . Myalgia 11/30/2011  . GERD (gastroesophageal reflux disease) 11/30/2011  . Insomnia 07/23/2011  . History of anemia 05/10/2011  . History of herpes simplex infection 05/10/2011  . Anxiety 05/04/2011  . Essential hypertension, benign 05/04/2011  . Menopause 05/04/2011  . Osteoporosis 05/04/2011  . DJD (degenerative joint disease) 05/04/2011  . Diverticulitis of sigmoid colon 07/29/2010    Annia Friendly, PT 12/02/16 10:17 PM  [place], [place], [address] Phone: [phone]   Fax:  [phone]  Name: [name] MRN: [id number] Date of Birth: 30-Jul-1940

## 2016-12-02 NOTE — Addendum Note (Signed)
Encounter addended by: Hayden Pedro, PA-C on: 12/02/2016 1:39 PM  Actions taken: Sign clinical note

## 2016-12-03 ENCOUNTER — Other Ambulatory Visit: Payer: Self-pay | Admitting: General Surgery

## 2016-12-03 DIAGNOSIS — C50211 Malignant neoplasm of upper-inner quadrant of right female breast: Secondary | ICD-10-CM

## 2016-12-03 DIAGNOSIS — Z170 Estrogen receptor positive status [ER+]: Principal | ICD-10-CM

## 2016-12-04 ENCOUNTER — Other Ambulatory Visit: Payer: Self-pay | Admitting: General Surgery

## 2016-12-04 ENCOUNTER — Encounter: Payer: Self-pay | Admitting: General Practice

## 2016-12-04 DIAGNOSIS — C50211 Malignant neoplasm of upper-inner quadrant of right female breast: Secondary | ICD-10-CM

## 2016-12-04 DIAGNOSIS — Z170 Estrogen receptor positive status [ER+]: Principal | ICD-10-CM

## 2016-12-04 NOTE — Progress Notes (Signed)
[place] Psychosocial Distress Screening [place] by phone following Breast Multidisciplinary Clinic to introduce [place] team/resources, reviewing distress screen per protocol.  The patient scored a 2 on the Psychosocial Distress Thermometer which indicates mild distress. Also assessed for distress and other psychosocial needs.   ONCBCN DISTRESS SCREENING 12/04/2016  Screening Type Initial Screening  Distress experienced in past week (1-10) 2  Physical Problem type Pain;Constipation/diarrhea;Skin dry/itchy  Referral to support programs Yes   Ms Bellavance reports great support from her family, friends, faith community ([employer]), and others across the country who are praying for her.  Anticipating the arrival of a great grandbaby within the month is a source of joy for her.     Follow up needed: No.  Ms Lavallee has full packet of [place] and knows to contact Team whenever needed, but please also page if immediate needs arise.  Thank you.   [place], [place], [facility] Pager [phone] Voicemail [phone]

## 2016-12-07 ENCOUNTER — Other Ambulatory Visit: Payer: Self-pay

## 2016-12-07 ENCOUNTER — Telehealth: Payer: Self-pay | Admitting: *Deleted

## 2016-12-07 ENCOUNTER — Encounter (HOSPITAL_BASED_OUTPATIENT_CLINIC_OR_DEPARTMENT_OTHER): Payer: Self-pay | Admitting: *Deleted

## 2016-12-07 NOTE — Telephone Encounter (Signed)
Spoke to pt regarding [place] from 12/02/16. Denies questions or concerns regarding dx or treatment care plan. Encourage pt to call with needs. Received verbal understanding.

## 2016-12-08 ENCOUNTER — Ambulatory Visit
Admission: RE | Admit: 2016-12-08 | Discharge: 2016-12-08 | Disposition: A | Discharge status: Home / Self Care | Payer: PPO | Source: Ambulatory Visit | Attending: General Surgery | Admitting: General Surgery

## 2016-12-08 DIAGNOSIS — Z170 Estrogen receptor positive status [ER+]: Principal | ICD-10-CM

## 2016-12-08 DIAGNOSIS — C50912 Malignant neoplasm of unspecified site of left female breast: Secondary | ICD-10-CM | POA: Diagnosis not present

## 2016-12-08 DIAGNOSIS — C50211 Malignant neoplasm of upper-inner quadrant of right female breast: Secondary | ICD-10-CM

## 2016-12-08 NOTE — Pre-Procedure Instructions (Signed)
Patient here for PAT.  Ensure given and instructed to drink prior to 1045 DOS. Also given Hibiclens for shower pm before surgery and morning of. Voiced understanding

## 2016-12-11 ENCOUNTER — Ambulatory Visit (HOSPITAL_BASED_OUTPATIENT_CLINIC_OR_DEPARTMENT_OTHER): Payer: PPO | Admitting: Anesthesiology

## 2016-12-11 ENCOUNTER — Encounter (HOSPITAL_BASED_OUTPATIENT_CLINIC_OR_DEPARTMENT_OTHER)
Admission: RE | Disposition: A | Discharge status: Home / Self Care | Payer: Self-pay | Source: Ambulatory Visit | Attending: General Surgery

## 2016-12-11 ENCOUNTER — Ambulatory Visit
Admission: RE | Admit: 2016-12-11 | Discharge: 2016-12-11 | Disposition: A | Discharge status: Home / Self Care | Payer: PPO | Source: Ambulatory Visit | Attending: General Surgery | Admitting: General Surgery

## 2016-12-11 ENCOUNTER — Ambulatory Visit (HOSPITAL_COMMUNITY)
Admission: RE | Admit: 2016-12-11 | Discharge: 2016-12-11 | Disposition: A | Discharge status: Home / Self Care | Payer: PPO | Source: Ambulatory Visit | Attending: General Surgery | Admitting: General Surgery

## 2016-12-11 ENCOUNTER — Encounter (HOSPITAL_BASED_OUTPATIENT_CLINIC_OR_DEPARTMENT_OTHER): Payer: Self-pay | Admitting: Anesthesiology

## 2016-12-11 ENCOUNTER — Ambulatory Visit (HOSPITAL_BASED_OUTPATIENT_CLINIC_OR_DEPARTMENT_OTHER)
Admission: RE | Admit: 2016-12-11 | Discharge: 2016-12-11 | Disposition: A | Discharge status: Home / Self Care | Payer: PPO | Source: Ambulatory Visit | Attending: General Surgery | Admitting: General Surgery

## 2016-12-11 DIAGNOSIS — Z6832 Body mass index (BMI) 32.0-32.9, adult: Secondary | ICD-10-CM | POA: Diagnosis not present

## 2016-12-11 DIAGNOSIS — K5190 Ulcerative colitis, unspecified, without complications: Secondary | ICD-10-CM | POA: Insufficient documentation

## 2016-12-11 DIAGNOSIS — Z79899 Other long term (current) drug therapy: Secondary | ICD-10-CM | POA: Diagnosis not present

## 2016-12-11 DIAGNOSIS — N6022 Fibroadenosis of left breast: Secondary | ICD-10-CM | POA: Insufficient documentation

## 2016-12-11 DIAGNOSIS — M1990 Unspecified osteoarthritis, unspecified site: Secondary | ICD-10-CM | POA: Insufficient documentation

## 2016-12-11 DIAGNOSIS — I10 Essential (primary) hypertension: Secondary | ICD-10-CM | POA: Insufficient documentation

## 2016-12-11 DIAGNOSIS — E669 Obesity, unspecified: Secondary | ICD-10-CM | POA: Insufficient documentation

## 2016-12-11 DIAGNOSIS — N6012 Diffuse cystic mastopathy of left breast: Secondary | ICD-10-CM | POA: Diagnosis not present

## 2016-12-11 DIAGNOSIS — G8918 Other acute postprocedural pain: Secondary | ICD-10-CM | POA: Diagnosis not present

## 2016-12-11 DIAGNOSIS — I252 Old myocardial infarction: Secondary | ICD-10-CM | POA: Diagnosis not present

## 2016-12-11 DIAGNOSIS — E7800 Pure hypercholesterolemia, unspecified: Secondary | ICD-10-CM | POA: Diagnosis not present

## 2016-12-11 DIAGNOSIS — C50211 Malignant neoplasm of upper-inner quadrant of right female breast: Secondary | ICD-10-CM

## 2016-12-11 DIAGNOSIS — F419 Anxiety disorder, unspecified: Secondary | ICD-10-CM | POA: Diagnosis not present

## 2016-12-11 DIAGNOSIS — C50412 Malignant neoplasm of upper-outer quadrant of left female breast: Secondary | ICD-10-CM | POA: Diagnosis not present

## 2016-12-11 DIAGNOSIS — Z170 Estrogen receptor positive status [ER+]: Principal | ICD-10-CM

## 2016-12-11 DIAGNOSIS — E039 Hypothyroidism, unspecified: Secondary | ICD-10-CM | POA: Insufficient documentation

## 2016-12-11 DIAGNOSIS — F329 Major depressive disorder, single episode, unspecified: Secondary | ICD-10-CM | POA: Diagnosis not present

## 2016-12-11 DIAGNOSIS — K219 Gastro-esophageal reflux disease without esophagitis: Secondary | ICD-10-CM | POA: Diagnosis not present

## 2016-12-11 DIAGNOSIS — R928 Other abnormal and inconclusive findings on diagnostic imaging of breast: Secondary | ICD-10-CM | POA: Diagnosis not present

## 2016-12-11 DIAGNOSIS — E785 Hyperlipidemia, unspecified: Secondary | ICD-10-CM | POA: Diagnosis not present

## 2016-12-11 DIAGNOSIS — C50912 Malignant neoplasm of unspecified site of left female breast: Secondary | ICD-10-CM | POA: Diagnosis not present

## 2016-12-11 DIAGNOSIS — Z87442 Personal history of urinary calculi: Secondary | ICD-10-CM | POA: Diagnosis not present

## 2016-12-11 HISTORY — DX: Malignant (primary) neoplasm, unspecified: C80.1

## 2016-12-11 HISTORY — DX: Anxiety disorder, unspecified: F41.9

## 2016-12-11 HISTORY — PX: BREAST LUMPECTOMY WITH RADIOACTIVE SEED AND SENTINEL LYMPH NODE BIOPSY: SHX6550

## 2016-12-11 SURGERY — BREAST LUMPECTOMY WITH RADIOACTIVE SEED AND SENTINEL LYMPH NODE BIOPSY
Anesthesia: General | Laterality: Left

## 2016-12-11 MED ORDER — MIDAZOLAM HCL 2 MG/2ML IJ SOLN
1.0000 mg | INTRAMUSCULAR | Status: DC | PRN
  Administered 2016-12-11 (×2): 1 mg via INTRAVENOUS

## 2016-12-11 MED ORDER — LIDOCAINE 2% (20 MG/ML) 5 ML SYRINGE
INTRAMUSCULAR | Status: AC
  Filled 2016-12-11: qty 5

## 2016-12-11 MED ORDER — OXYCODONE HCL 5 MG PO TABS: 5.0000 mg | ORAL_TABLET | Freq: Once | ORAL | Status: DC | PRN

## 2016-12-11 MED ORDER — SODIUM CHLORIDE 0.9 % IJ SOLN
INTRAVENOUS | Status: DC | PRN
  Administered 2016-12-11: 2 mL via INTRADERMAL

## 2016-12-11 MED ORDER — MIDAZOLAM HCL 2 MG/2ML IJ SOLN
INTRAMUSCULAR | Status: AC
  Filled 2016-12-11: qty 2

## 2016-12-11 MED ORDER — FENTANYL CITRATE (PF) 100 MCG/2ML IJ SOLN
50.0000 ug | INTRAMUSCULAR | Status: DC | PRN
  Administered 2016-12-11 (×2): 50 ug via INTRAVENOUS

## 2016-12-11 MED ORDER — BUPIVACAINE HCL (PF) 0.25 % IJ SOLN
INTRAMUSCULAR | Status: DC | PRN
  Administered 2016-12-11: 5 mL

## 2016-12-11 MED ORDER — ACETAMINOPHEN 500 MG PO TABS
1000.0000 mg | ORAL_TABLET | ORAL | Status: AC
  Administered 2016-12-11: 1000 mg via ORAL

## 2016-12-11 MED ORDER — ACETAMINOPHEN 500 MG PO TABS
ORAL_TABLET | ORAL | Status: AC
  Filled 2016-12-11: qty 2

## 2016-12-11 MED ORDER — CEFAZOLIN SODIUM-DEXTROSE 2-4 GM/100ML-% IV SOLN
2.0000 g | INTRAVENOUS | Status: AC
Start: 2016-12-12 — End: 2016-12-11
  Administered 2016-12-11: 2 g via INTRAVENOUS

## 2016-12-11 MED ORDER — GABAPENTIN 300 MG PO CAPS
300.0000 mg | ORAL_CAPSULE | ORAL | Status: AC
  Administered 2016-12-11: 300 mg via ORAL

## 2016-12-11 MED ORDER — TRAMADOL HCL 50 MG PO TABS: 100.0000 mg | ORAL_TABLET | Freq: Four times a day (QID) | ORAL | 0 refills | Status: DC | PRN

## 2016-12-11 MED ORDER — SCOPOLAMINE 1 MG/3DAYS TD PT72: 1.0000 | MEDICATED_PATCH | Freq: Once | TRANSDERMAL | Status: DC | PRN

## 2016-12-11 MED ORDER — LACTATED RINGERS IV SOLN
INTRAVENOUS | Status: DC
  Administered 2016-12-11: 13:00:00 via INTRAVENOUS

## 2016-12-11 MED ORDER — FENTANYL CITRATE (PF) 100 MCG/2ML IJ SOLN
INTRAMUSCULAR | Status: AC
  Filled 2016-12-11: qty 2

## 2016-12-11 MED ORDER — TECHNETIUM TC 99M SULFUR COLLOID FILTERED
1.0000 | Freq: Once | INTRAVENOUS | Status: AC | PRN
  Administered 2016-12-11: 1 via INTRADERMAL

## 2016-12-11 MED ORDER — ONDANSETRON HCL 4 MG/2ML IJ SOLN
INTRAMUSCULAR | Status: AC
  Filled 2016-12-11: qty 2

## 2016-12-11 MED ORDER — FENTANYL CITRATE (PF) 100 MCG/2ML IJ SOLN
INTRAMUSCULAR | Status: DC | PRN
  Administered 2016-12-11: 50 ug via INTRAVENOUS

## 2016-12-11 MED ORDER — METOCLOPRAMIDE HCL 5 MG/ML IJ SOLN: 10.0000 mg | Freq: Once | INTRAMUSCULAR | Status: DC | PRN

## 2016-12-11 MED ORDER — PROPOFOL 10 MG/ML IV BOLUS
INTRAVENOUS | Status: DC | PRN
  Administered 2016-12-11: 160 mg via INTRAVENOUS

## 2016-12-11 MED ORDER — DEXAMETHASONE SODIUM PHOSPHATE 10 MG/ML IJ SOLN
INTRAMUSCULAR | Status: AC
  Filled 2016-12-11: qty 1

## 2016-12-11 MED ORDER — OXYCODONE HCL 5 MG/5ML PO SOLN: 5.0000 mg | Freq: Once | ORAL | Status: DC | PRN

## 2016-12-11 MED ORDER — DEXAMETHASONE SODIUM PHOSPHATE 4 MG/ML IJ SOLN
INTRAMUSCULAR | Status: DC | PRN
  Administered 2016-12-11: 10 mg via INTRAVENOUS

## 2016-12-11 MED ORDER — GABAPENTIN 300 MG PO CAPS
ORAL_CAPSULE | ORAL | Status: AC
  Filled 2016-12-11: qty 1

## 2016-12-11 MED ORDER — FENTANYL CITRATE (PF) 100 MCG/2ML IJ SOLN
25.0000 ug | INTRAMUSCULAR | Status: DC | PRN
  Administered 2016-12-11 (×2): 25 ug via INTRAVENOUS
  Administered 2016-12-11: 50 ug via INTRAVENOUS

## 2016-12-11 MED ORDER — CEFAZOLIN SODIUM-DEXTROSE 2-4 GM/100ML-% IV SOLN
INTRAVENOUS | Status: AC
  Filled 2016-12-11: qty 100

## 2016-12-11 MED ORDER — BUPIVACAINE-EPINEPHRINE (PF) 0.5% -1:200000 IJ SOLN
INTRAMUSCULAR | Status: DC | PRN
  Administered 2016-12-11: 30 mL via PERINEURAL

## 2016-12-11 MED ORDER — LIDOCAINE 2% (20 MG/ML) 5 ML SYRINGE
INTRAMUSCULAR | Status: DC | PRN
  Administered 2016-12-11: 40 mg via INTRAVENOUS

## 2016-12-11 MED ORDER — EPHEDRINE 5 MG/ML INJ
INTRAVENOUS | Status: AC
  Filled 2016-12-11: qty 10

## 2016-12-11 MED ORDER — EPHEDRINE SULFATE 50 MG/ML IJ SOLN
INTRAMUSCULAR | Status: DC | PRN
  Administered 2016-12-11: 10 mg via INTRAVENOUS

## 2016-12-11 SURGICAL SUPPLY — 56 items
APPLIER CLIP 9.375 MED OPEN (MISCELLANEOUS)
BINDER BREAST LRG (GAUZE/BANDAGES/DRESSINGS) IMPLANT
BINDER BREAST MEDIUM (GAUZE/BANDAGES/DRESSINGS) IMPLANT
BINDER BREAST XLRG (GAUZE/BANDAGES/DRESSINGS) IMPLANT
BINDER BREAST XXLRG (GAUZE/BANDAGES/DRESSINGS) IMPLANT
BLADE SURG 15 STRL LF DISP TIS (BLADE) ×1 IMPLANT
BLADE SURG 15 STRL SS (BLADE) ×1
CANISTER SUC SOCK COL 7IN (MISCELLANEOUS) IMPLANT
CANISTER SUCT 1200ML W/VALVE (MISCELLANEOUS) IMPLANT
CHLORAPREP W/TINT 26ML (MISCELLANEOUS) ×2 IMPLANT
CLIP APPLIE 9.375 MED OPEN (MISCELLANEOUS) IMPLANT
CLIP VESOCCLUDE SM WIDE 6/CT (CLIP) ×2 IMPLANT
COVER BACK TABLE 60X90IN (DRAPES) ×2 IMPLANT
COVER MAYO STAND STRL (DRAPES) ×2 IMPLANT
COVER PROBE W GEL 5X96 (DRAPES) ×2 IMPLANT
DECANTER SPIKE VIAL GLASS SM (MISCELLANEOUS) IMPLANT
DERMABOND ADVANCED (GAUZE/BANDAGES/DRESSINGS) ×1
DERMABOND ADVANCED .7 DNX12 (GAUZE/BANDAGES/DRESSINGS) ×1 IMPLANT
DEVICE DUBIN W/COMP PLATE 8390 (MISCELLANEOUS) ×2 IMPLANT
DRAPE LAPAROSCOPIC ABDOMINAL (DRAPES) ×2 IMPLANT
DRAPE UTILITY XL STRL (DRAPES) ×2 IMPLANT
ELECT COATED BLADE 2.86 ST (ELECTRODE) ×2 IMPLANT
ELECT REM PT RETURN 9FT ADLT (ELECTROSURGICAL) ×2
ELECTRODE REM PT RTRN 9FT ADLT (ELECTROSURGICAL) ×1 IMPLANT
GLOVE BIO SURGEON STRL SZ7 (GLOVE) ×4 IMPLANT
GLOVE BIOGEL PI IND STRL 7.5 (GLOVE) ×1 IMPLANT
GLOVE BIOGEL PI INDICATOR 7.5 (GLOVE) ×1
GOWN STRL REUS W/ TWL LRG LVL3 (GOWN DISPOSABLE) ×2 IMPLANT
GOWN STRL REUS W/TWL LRG LVL3 (GOWN DISPOSABLE) ×2
HEMOSTAT ARISTA ABSORB 3G PWDR (MISCELLANEOUS) IMPLANT
ILLUMINATOR WAVEGUIDE N/F (MISCELLANEOUS) IMPLANT
KIT MARKER MARGIN INK (KITS) ×2 IMPLANT
LIGHT WAVEGUIDE WIDE FLAT (MISCELLANEOUS) IMPLANT
NDL SAFETY ECLIPSE 18X1.5 (NEEDLE) IMPLANT
NEEDLE HYPO 18GX1.5 SHARP (NEEDLE)
NEEDLE HYPO 25X1 1.5 SAFETY (NEEDLE) ×2 IMPLANT
NS IRRIG 1000ML POUR BTL (IV SOLUTION) IMPLANT
PACK BASIN DAY SURGERY FS (CUSTOM PROCEDURE TRAY) ×2 IMPLANT
PENCIL BUTTON HOLSTER BLD 10FT (ELECTRODE) ×2 IMPLANT
SLEEVE SCD COMPRESS KNEE MED (MISCELLANEOUS) ×2 IMPLANT
SPONGE LAP 4X18 X RAY DECT (DISPOSABLE) ×2 IMPLANT
STRIP CLOSURE SKIN 1/2X4 (GAUZE/BANDAGES/DRESSINGS) ×2 IMPLANT
SUT ETHILON 2 0 FS 18 (SUTURE) IMPLANT
SUT MNCRL AB 4-0 PS2 18 (SUTURE) ×2 IMPLANT
SUT MON AB 5-0 PS2 18 (SUTURE) ×2 IMPLANT
SUT SILK 2 0 SH (SUTURE) ×2 IMPLANT
SUT VIC AB 2-0 SH 27 (SUTURE) ×2
SUT VIC AB 2-0 SH 27XBRD (SUTURE) ×2 IMPLANT
SUT VIC AB 3-0 SH 27 (SUTURE) ×1
SUT VIC AB 3-0 SH 27X BRD (SUTURE) ×1 IMPLANT
SUT VIC AB 5-0 PS2 18 (SUTURE) IMPLANT
SYR CONTROL 10ML LL (SYRINGE) ×2 IMPLANT
TOWEL OR 17X24 6PK STRL BLUE (TOWEL DISPOSABLE) ×2 IMPLANT
TOWEL OR NON WOVEN STRL DISP B (DISPOSABLE) ×2 IMPLANT
TUBE CONNECTING 20X1/4 (TUBING) IMPLANT
YANKAUER SUCT BULB TIP NO VENT (SUCTIONS) IMPLANT

## 2016-12-11 NOTE — Progress Notes (Signed)
Assisted Dr. Royce Macadamia with left, ultrasound guided, pectoralis block. Side rails up, monitors on throughout procedure. See vital signs in flow sheet. Tolerated Procedure well.

## 2016-12-11 NOTE — Anesthesia Preprocedure Evaluation (Addendum)
Anesthesia Evaluation  Patient identified by MRN, date of birth, ID band Patient awake    Reviewed: Allergy & Precautions, NPO status , Patient's Chart, lab work & pertinent test results  Airway Mallampati: III  TM Distance: >3 FB Neck ROM: Full    Dental  (+) Caps, Dental Advisory Given,    Pulmonary neg pulmonary ROS,    Pulmonary exam normal breath sounds clear to auscultation       Cardiovascular hypertension, Pt. on medications + Past MI  Normal cardiovascular exam Rhythm:Regular Rate:Normal     Neuro/Psych PSYCHIATRIC DISORDERS Anxiety Depression Hx/o post herpetic neuralgia  Neuromuscular disease    GI/Hepatic Neg liver ROS, GERD  Medicated and Controlled,  Endo/Other  Hypothyroidism Hyperthyroidism Hyperlipidemia Obesity Left Breast Ca Hyperglycemia  Renal/GU Renal diseaseHx/o renal calculi  negative genitourinary   Musculoskeletal  (+) Arthritis , Osteoarthritis,    Abdominal (+) + obese,   Peds  Hematology negative hematology ROS (+)   Anesthesia Other Findings   Reproductive/Obstetrics                            Anesthesia Physical Anesthesia Plan  ASA: II  Anesthesia Plan: General   Post-op Pain Management:  Regional for Post-op pain   Induction: Intravenous  PONV Risk Score and Plan: 4 or greater and Ondansetron, Dexamethasone, Midazolam and Treatment may vary due to age or medical condition  Airway Management Planned: LMA  Additional Equipment:   Intra-op Plan:   Post-operative Plan: Extubation in OR  Informed Consent: I have reviewed the patients History and Physical, chart, labs and discussed the procedure including the risks, benefits and alternatives for the proposed anesthesia with the patient or authorized representative who has indicated his/her understanding and acceptance.   Dental advisory given  Plan Discussed with: CRNA, Anesthesiologist and  Surgeon  Anesthesia Plan Comments:         Anesthesia Quick Evaluation

## 2016-12-11 NOTE — Interval H&P Note (Signed)
History and Physical Interval Note:  12/11/2016 1:35 PM  [name]  has presented today for surgery, with the diagnosis of left breast cancer  The various methods of treatment have been discussed with the patient and family. After consideration of risks, benefits and other options for treatment, the patient has consented to  Procedure(s): BREAST LUMPECTOMY WITH RADIOACTIVE SEED AND SENTINEL LYMPH NODE BIOPSY (Left) as a surgical intervention .  The patient's history has been reviewed, patient examined, no change in status, stable for surgery.  I have reviewed the patient's chart and labs.  Questions were answered to the patient's satisfaction.     Rolm Bookbinder

## 2016-12-11 NOTE — H&P (Signed)
76 yof referred by Dr Coralyn Mark for palpable left breast mass. she has fh in her daughter who has dcis. she has c density breasts. she had [place] that showed a 1.6x1.4x0.9 cm in size and axilla was negative. core biopsy shows a grade I IDC that is er/pr positive, her 2 negative and Ki is 15%. her palpable mass has been present for 3-4 weeks. there is no nipple discharge. she also is undergoing evaluation for asx nephrolithiasis right now. 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 She is here with her daughter [name] today.   Past Surgical History Tawni Pummel, RN; 12/02/2016 7:31 AM) Breast Biopsy  Bilateral. Colon Polyp Removal - Colonoscopy  Foot Surgery  Right. Gallbladder Surgery - Laparoscopic  Knee Surgery  Right. Shoulder Surgery  Left. Tonsillectomy   Diagnostic Studies History Tawni Pummel, RN; 12/02/2016 7:31 AM) Colonoscopy  1-5 years ago Mammogram  within last year  Medication History Tawni Pummel, RN; 12/02/2016 7:32 AM) Medications Reconciled  Social History Tawni Pummel, RN; 12/02/2016 7:32 AM) Alcohol use  Occasional alcohol use. Caffeine use  Carbonated beverages, Coffee, Tea. No drug use  Tobacco use  Never smoker.  Family History Tawni Pummel, RN; 12/02/2016 7:32 AM) Anesthetic complications  Mother. Arthritis  Father, Mother, Sister. Breast Cancer  Daughter. Cancer  Family Members In General. Cerebrovascular Accident  Mother. Colon Cancer  Family Members In General. Heart Disease  Father. Hypertension  Father. Melanoma  Family Members In General. Migraine Headache  Father. Prostate Cancer  Father. Seizure disorder  Sister.  Pregnancy / Birth History Tawni Pummel, RN; 12/02/2016 7:32 AM) Durenda Age  3 Para  3  Other Problems Tawni Pummel, RN; 12/02/2016 7:32 AM) Arthritis  Back Pain  Gastroesophageal Reflux Disease  Hemorrhoids  High blood pressure  Hypercholesterolemia  Kidney Stone   Lump In Breast  Thyroid Disease  Ulcerative Colitis   Review of Systems Sunday Spillers Ledford RN; 12/02/2016 7:32 AM) General Not Present- Appetite Loss, Chills, Fatigue, Fever, Night Sweats, Weight Gain and Weight Loss. Skin Present- Change in Wart/Mole and Dryness. Not Present- Hives, Jaundice, New Lesions, Non-Healing Wounds, Rash and Ulcer. HEENT Present- Ringing in the Ears, Sinus Pain and Wears glasses/contact lenses. Not Present- Earache, Hearing Loss, Hoarseness, Nose Bleed, Oral Ulcers, Seasonal Allergies, Sore Throat, Visual Disturbances and Yellow Eyes. Respiratory Present- Snoring. Not Present- Bloody sputum, Chronic Cough, Difficulty Breathing and Wheezing. Breast Present- Breast Mass. Not Present- Breast Pain, Nipple Discharge and Skin Changes. Cardiovascular Not Present- Chest Pain, Difficulty Breathing Lying Down, Leg Cramps, Palpitations, Rapid Heart Rate, Shortness of Breath and Swelling of Extremities. Gastrointestinal Present- Excessive gas, Hemorrhoids and Indigestion. Not Present- Abdominal Pain, Bloating, Bloody Stool, Change in Bowel Habits, Chronic diarrhea, Constipation, Difficulty Swallowing, Gets full quickly at meals, Nausea, Rectal Pain and Vomiting. Female Genitourinary Not Present- Frequency, Nocturia, Painful Urination, Pelvic Pain and Urgency. Musculoskeletal Present- Back Pain and Joint Pain. Not Present- Joint Stiffness, Muscle Pain, Muscle Weakness and Swelling of Extremities. Neurological Not Present- Decreased Memory, Fainting, Headaches, Numbness, Seizures, Tingling, Tremor, Trouble walking and Weakness. Psychiatric Not Present- Anxiety, Bipolar, Change in Sleep Pattern, Depression, Fearful and Frequent crying. Endocrine Present- New Diabetes. Not Present- Cold Intolerance, Excessive Hunger, Hair Changes, Heat Intolerance and Hot flashes. Hematology Not Present- Blood Thinners, Easy Bruising, Excessive bleeding, Gland problems, HIV and Persistent  Infections.   Physical Exam Rolm Bookbinder MD; 12/02/2016 9:28 PM) General Mental Status-Alert. Head and Neck Trachea-midline. Thyroid Gland Characteristics - normal size and consistency. Eye Sclera/Conjunctiva -  Bilateral-No scleral icterus. Breast Nipples-No Discharge. Note: palpable small lateral left breast mass with some hematoma Cardiovascular Cardiovascular examination reveals -normal heart sounds, regular rate and rhythm with no murmurs. Abdomen Note: soft nt Neuropsychiatric Mental status exam performed with findings of-Oriented X3 with appropriate mood and affect. Lymphatic Head & Neck General Head & Neck Lymphatics: Bilateral - Description - Normal. Axillary General Axillary Region: Bilateral - Description - Normal. Note: no [place] adenopathy  Assessment & Plan Rolm Bookbinder MD; 12/02/2016 9:32 PM) CARCINOMA OF UPPER-OUTER QUADRANT OF LEFT FEMALE BREAST (C50.412) Story: Left breast seed guided lumpectomy, left axillary sentinel node biopsy we discussed pathophysiology of breast cancer and all available treatment options. I recommended sentinel node biopsy as she is very healthy and active. we discussed performane with radioactive dye. we discussed up to 5% risks of lymphedema and longterm shoulder issues. will await final pathology before making any decisions on next step if positive. we also discussed lumpectomyvs mastectomy . there is no benefit for mastectomy and she is not interested either. we discussed lumpectomy with seed guidance. discussed 5% risk of positive margin requiring a second surgery. discussed possible radiation postop. we discussed risks and recovery.

## 2016-12-11 NOTE — Anesthesia Procedure Notes (Signed)
Anesthesia Regional Block: Pectoralis block   Pre-Anesthetic Checklist: ,, timeout performed, Correct Patient, Correct Site, Correct Laterality, Correct Procedure, Correct Position, site marked, Risks and benefits discussed,  Surgical consent,  Pre-op evaluation,  At surgeon's request and post-op pain management  Laterality: Left  Prep: chloraprep       Needles:  Injection technique: Single-shot  Needle Type: Echogenic Stimulator Needle     Needle Length: 9cm  Needle Gauge: 21   Needle insertion depth: 8 cm   Additional Needles:   Procedures:,,,, ultrasound used (permanent image in chart),,,,  Narrative:  Start time: 12/11/2016 1:32 PM End time: 12/11/2016 1:37 PM Injection made incrementally with aspirations every 5 mL.  Performed by: Personally  Anesthesiologist: Josephine Igo, MD  Additional Notes: Timeout performed. Patient sedated. Relevant anatomy ID'd using [place]. Incremental 2-15ml injection of LA with frequent aspiration. Patient tolerated procedure well.

## 2016-12-11 NOTE — Discharge Instructions (Signed)
[place] Office Phone Number [phone]   POST OP INSTRUCTIONS  Always review your discharge instruction sheet given to you by the facility where your surgery was performed.  IF YOU HAVE DISABILITY OR FAMILY LEAVE FORMS, YOU MUST BRING THEM TO THE OFFICE FOR PROCESSING.  DO NOT GIVE THEM TO YOUR DOCTOR.  1. A prescription for pain medication may be given to you upon discharge.  Take your pain medication as prescribed, if needed.  If narcotic pain medicine is not needed, then you may take acetaminophen (Tylenol), naprosyn (Alleve) or ibuprofen (Advil) as needed. 2. Take your usually prescribed medications unless otherwise directed 3. If you need a refill on your pain medication, please contact your pharmacy.  They will contact our office to request authorization.  Prescriptions will not be filled after 5pm or on week-ends. 4. You should eat very light the first 24 hours after surgery, such as soup, crackers, pudding, etc.  Resume your normal diet the day after surgery. 5. Most patients will experience some swelling and bruising in the breast.  Ice packs and a good support bra will help.  Wear the breast binder provided or a sports bra for 72 hours day and night.  After that wear a sports bra during the day until you return to the office. Swelling and bruising can take several days to resolve.  6. It is common to experience some constipation if taking pain medication after surgery.  Increasing fluid intake and taking a stool softener will usually help or prevent this problem from occurring.  A mild laxative (Milk of Magnesia or Miralax) should be taken according to package directions if there are no bowel movements after 48 hours. 7. Unless discharge instructions indicate otherwise, you may remove your bandages 48 hours after surgery and you may shower at that time.  You may have steri-strips (small skin tapes) in place directly over the incision.  These strips should be left on the  skin for 7-10 days and will come off on their own.  If your surgeon used skin glue on the incision, you may shower in 24 hours.  The glue will flake off over the next 2-3 weeks.  Any sutures or staples will be removed at the office during your follow-up visit. 8. ACTIVITIES:  You may resume regular daily activities (gradually increasing) beginning the next day.  Wearing a good support bra or sports bra minimizes pain and swelling.  You may have sexual intercourse when it is comfortable. a. You may drive when you no longer are taking prescription pain medication, you can comfortably wear a seatbelt, and you can safely maneuver your car and apply brakes. b. RETURN TO WORK:  ______________________________________________________________________________________ 9. You should see your doctor in the office for a follow-up appointment approximately two weeks after your surgery.  Your doctors nurse will typically make your follow-up appointment when she calls you with your pathology report.  Expect your pathology report 3-4 business days after your surgery.  You may call to check if you do not hear from [place] after three days. 10. OTHER INSTRUCTIONS: _______________________________________________________________________________________________ _____________________________________________________________________________________________________________________________________ _____________________________________________________________________________________________________________________________________ _____________________________________________________________________________________________________________________________________  WHEN TO CALL DR WAKEFIELD: 1. Fever over 101.0 2. Nausea and/or vomiting. 3. Extreme swelling or bruising. 4. Continued bleeding from incision. 5. Increased pain, redness, or drainage from the incision.  The clinic staff is available to answer your questions during regular  business hours.  Please dont hesitate to call and ask to speak to one of the nurses for clinical concerns.  If you have a medical emergency, go to the nearest emergency room or call 911.  A surgeon from [facility] is always on call at the hospital.  For further questions, please visit [email or web address]   Post Anesthesia Home Care Instructions  Activity: Get plenty of rest for the remainder of the day. A responsible individual must stay with you for 24 hours following the procedure.  For the next 24 hours, DO NOT: -Drive a car -Paediatric nurse -Drink alcoholic beverages -Take any medication unless instructed by your physician -Make any legal decisions or sign important papers.  Meals: Start with liquid foods such as gelatin or soup. Progress to regular foods as tolerated. Avoid greasy, spicy, heavy foods. If nausea and/or vomiting occur, drink only clear liquids until the nausea and/or vomiting subsides. Call your physician if vomiting continues.  Special Instructions/Symptoms: Your throat may feel dry or sore from the anesthesia or the breathing tube placed in your throat during surgery. If this causes discomfort, gargle with warm salt water. The discomfort should disappear within 24 hours.  If you had a scopolamine patch placed behind your ear for the management of post- operative nausea and/or vomiting:  1. The medication in the patch is effective for 72 hours, after which it should be removed.  Wrap patch in a tissue and discard in the trash. Wash hands thoroughly with soap and water. 2. You may remove the patch earlier than 72 hours if you experience unpleasant side effects which may include dry mouth, dizziness or visual disturbances. 3. Avoid touching the patch. Wash your hands with soap and water after contact with the patch.   Regional Anesthesia Blocks  1. Numbness or the inability to move the "blocked" extremity may last from 3-48 hours after  placement. The length of time depends on the medication injected and your individual response to the medication. If the numbness is not going away after 48 hours, call your surgeon.  2. The extremity that is blocked will need to be protected until the numbness is gone and the  Strength has returned. Because you cannot feel it, you will need to take extra care to avoid injury. Because it may be weak, you may have difficulty moving it or using it. You may not know what position it is in without looking at it while the block is in effect.  3. For blocks in the legs and feet, returning to weight bearing and walking needs to be done carefully. You will need to wait until the numbness is entirely gone and the strength has returned. You should be able to move your leg and foot normally before you try and bear weight or walk. You will need someone to be with you when you first try to ensure you do not fall and possibly risk injury.  4. Bruising and tenderness at the needle site are common side effects and will resolve in a few days.  5. Persistent numbness or new problems with movement should be communicated to the surgeon or the [place] [phone] [place] [phone]).

## 2016-12-11 NOTE — Anesthesia Procedure Notes (Signed)
Procedure Name: LMA Insertion Performed by: Kashia Brossard W, CRNA Pre-anesthesia Checklist: Patient identified, Emergency Drugs available, Suction available and Patient being monitored Patient Re-evaluated:Patient Re-evaluated prior to induction Oxygen Delivery Method: Circle system utilized Preoxygenation: Pre-oxygenation with 100% oxygen Induction Type: IV induction Ventilation: Mask ventilation without difficulty LMA: LMA inserted LMA Size: 4.0 Number of attempts: 1 Placement Confirmation: positive ETCO2 Tube secured with: Tape Dental Injury: Teeth and Oropharynx as per pre-operative assessment        

## 2016-12-11 NOTE — Anesthesia Postprocedure Evaluation (Signed)
Anesthesia Post Note  Patient: [name]  Procedure(s) Performed: BREAST LUMPECTOMY WITH RADIOACTIVE SEED AND SENTINEL LYMPH NODE BIOPSY (Left )     Patient location during evaluation: PACU Anesthesia Type: General Level of consciousness: awake and alert and oriented Pain management: pain level controlled Vital Signs Assessment: post-procedure vital signs reviewed and stable Respiratory status: spontaneous breathing, nonlabored ventilation and respiratory function stable Cardiovascular status: blood pressure returned to baseline and stable Postop Assessment: no apparent nausea or vomiting Anesthetic complications: no    Last Vitals:  Vitals:   12/11/16 1541 12/11/16 1542  BP: 134/81 134/81  Pulse: 93   Resp: 14   Temp:    SpO2: 100%     Last Pain:  Vitals:   12/11/16 1530  PainSc: 3                  Laval Cafaro A.

## 2016-12-11 NOTE — Op Note (Signed)
Preoperative diagnosis: Leftbreast cancer, clinical stage I Postoperative diagnosis: same as above Procedure:Leftbreast seed guided lumpectomy Left deep axillary sentinel node biopsy Injection blue dye for sentinel node idenfitication Surgeon: Dr Serita Grammes TDV:[id number] Anes: general  Specimens  1.Leftbreast tissue marked with paint 2.Leftaxillary sentinel nodes with highest count283 3. Additional left breast superior, medial, lateral and posterior margins marked short superior, long lateral, double deep Complications none Drains none Sponge count correct Dispo to pacu stable  Indications: This is a33yof with a newly diagnosed clinical stage Ileft breast cancer. We discussed options and have elected to proceed with seed guided lumpectomy and sentinel node biopsy.   Procedure: After informed consent was obtained the patient was taken to the operating room. She first was given technetium in standard periareolar fashion. She had a pectoral block. She was given antibiotics. Sequential compression devices were on her legs. She was then placed under general anesthesia with an LMA. Then she was prepped and draped in the standard sterile surgical fashion. Surgical timeout was then performed. There was little radioactivity in her right axilla initially.  I elected due to that to infiltrate a mixture of methylene blue dye saline in the periareolar location. I massaged this upon completion.  I then located the seed in theupper central left breastI infiltrated marcaine in the skin and then made an incision around the areola to hide the scar. I then used the neoprobe to remove the seed and the surrounding tissue with attempt to get clear margins. I marked this with paint. MM confirmed removal of seed and theclip.I did remove additional margins that I thought might be close. I placed clips in the cavity.I then obtained hemostasis. This was marked as above.I approximated the  breast tissue with 2-0 vicryl. The skin was closed with 3-0 vicryl and 5-0 monocryl. Glue and steristrips were applied. I then infiltrated marcaine in the low axilla and made an incision below the hairline.Icarried this through the axillary fascia.there was radioactivity present that was easily noted.  I removed the radioactive nodes.there was no blue dye present. There were no enlarged nodes. The background radioactivity was minimal. I then obtained hemostasis. I closed the axillary fascia with 2-0 vicryl.The skin was closed with 3-0 vicryl and 4-0 monocryl. Glue and steristrips were applied. A binder was placed. She was extubated and transferred to PACU.

## 2016-12-11 NOTE — Transfer of Care (Signed)
Immediate Anesthesia Transfer of Care Note  Patient: [name]  Procedure(s) Performed: BREAST LUMPECTOMY WITH RADIOACTIVE SEED AND SENTINEL LYMPH NODE BIOPSY (Left )  Patient Location: PACU  Anesthesia Type:General  Level of Consciousness: awake and sedated  Airway & Oxygen Therapy: Patient Spontanous Breathing and Patient connected to face mask oxygen  Post-op Assessment: Report given to RN and Post -op Vital signs reviewed and stable  Post vital signs: Reviewed and stable  Last Vitals:  Vitals:   12/11/16 1330 12/11/16 1345  BP: (!) 163/101 132/72  Pulse: 87 88  Resp: 14 16  SpO2: 100% 100%    Last Pain: There were no vitals filed for this visit.       Complications: No apparent anesthesia complications

## 2016-12-11 NOTE — Brief Op Note (Signed)
12/11/2016  2:56 PM  PATIENT:  [name]  76 y.o. female  PRE-OPERATIVE DIAGNOSIS:  Left breast cancer  POST-OPERATIVE DIAGNOSIS:  Left breast cancer  PROCEDURE:  Procedure(s): BREAST LUMPECTOMY WITH RADIOACTIVE SEED AND SENTINEL LYMPH NODE BIOPSY (Left)  SURGEON:  Surgeon(s) and Role:    Rolm Bookbinder, MD - Primary  PHYSICIAN ASSISTANT:   ASSISTANTS: none   ANESTHESIA:   general with pec block  EBL:  5 mL   BLOOD ADMINISTERED:none  DRAINS: none   LOCAL MEDICATIONS USED:  MARCAINE     SPECIMEN:  Lumpectomy with SLN  DISPOSITION OF SPECIMEN:  PATHOLOGY  COUNTS:  YES  TOURNIQUET:  * No tourniquets in log *  DICTATION: .Dragon Dictation  PLAN OF CARE: Discharge to home after PACU  PATIENT DISPOSITION:  PACU - hemodynamically stable.   Delay start of Pharmacological VTE agent (>24hrs) due to surgical blood loss or risk of bleeding: na

## 2016-12-14 ENCOUNTER — Encounter (HOSPITAL_BASED_OUTPATIENT_CLINIC_OR_DEPARTMENT_OTHER): Payer: Self-pay | Admitting: General Surgery

## 2016-12-14 ENCOUNTER — Other Ambulatory Visit: Payer: PPO

## 2016-12-16 ENCOUNTER — Telehealth: Payer: Self-pay | Admitting: *Deleted

## 2016-12-16 NOTE — Telephone Encounter (Signed)
Ordered oncotype per Dr. Magrinat.  Faxed requisition to pathology and confirmed receipt.  

## 2016-12-21 DIAGNOSIS — M1811 Unilateral primary osteoarthritis of first carpometacarpal joint, right hand: Secondary | ICD-10-CM | POA: Diagnosis not present

## 2016-12-21 DIAGNOSIS — M79644 Pain in right finger(s): Secondary | ICD-10-CM | POA: Diagnosis not present

## 2016-12-23 ENCOUNTER — Encounter: Payer: Self-pay | Admitting: Oncology

## 2016-12-23 NOTE — Progress Notes (Signed)
Oncotype requested medical records, faxed to [phone], confirmation received

## 2016-12-24 ENCOUNTER — Other Ambulatory Visit: Payer: PPO

## 2016-12-28 DIAGNOSIS — C50412 Malignant neoplasm of upper-outer quadrant of left female breast: Secondary | ICD-10-CM | POA: Diagnosis not present

## 2016-12-28 DIAGNOSIS — Z170 Estrogen receptor positive status [ER+]: Secondary | ICD-10-CM | POA: Diagnosis not present

## 2016-12-29 ENCOUNTER — Telehealth: Payer: Self-pay | Admitting: *Deleted

## 2016-12-29 DIAGNOSIS — C50412 Malignant neoplasm of upper-outer quadrant of left female breast: Secondary | ICD-10-CM

## 2016-12-29 DIAGNOSIS — Z170 Estrogen receptor positive status [ER+]: Principal | ICD-10-CM

## 2016-12-29 NOTE — Telephone Encounter (Signed)
Received oncotype score of 24/16%. Physician team notified. Called pt with results and discussed she does not need chemotherapy and her next step will be xrt. Received verbal understanding. Referral placed for appt with Dr. Lisbeth Renshaw

## 2016-12-30 ENCOUNTER — Encounter: Payer: Self-pay | Admitting: Adult Health

## 2016-12-31 ENCOUNTER — Encounter (HOSPITAL_COMMUNITY): Payer: Self-pay

## 2017-01-01 ENCOUNTER — Ambulatory Visit
Admission: RE | Admit: 2017-01-01 | Discharge: 2017-01-01 | Disposition: A | Discharge status: Home / Self Care | Payer: PPO | Source: Ambulatory Visit | Attending: Orthopedic Surgery | Admitting: Orthopedic Surgery

## 2017-01-01 DIAGNOSIS — M47817 Spondylosis without myelopathy or radiculopathy, lumbosacral region: Secondary | ICD-10-CM | POA: Diagnosis not present

## 2017-01-01 DIAGNOSIS — M5136 Other intervertebral disc degeneration, lumbar region: Secondary | ICD-10-CM

## 2017-01-01 MED ORDER — IOPAMIDOL (ISOVUE-M 200) INJECTION 41%
1.0000 mL | Freq: Once | INTRAMUSCULAR | Status: AC
  Administered 2017-01-01: 1 mL via EPIDURAL

## 2017-01-01 MED ORDER — METHYLPREDNISOLONE ACETATE 40 MG/ML INJ SUSP (RADIOLOG
120.0000 mg | Freq: Once | INTRAMUSCULAR | Status: AC
  Administered 2017-01-01: 120 mg via EPIDURAL

## 2017-01-01 NOTE — Discharge Instructions (Signed)

## 2017-01-06 DIAGNOSIS — D0471 Carcinoma in situ of skin of right lower limb, including hip: Secondary | ICD-10-CM | POA: Diagnosis not present

## 2017-01-06 NOTE — Progress Notes (Signed)
Location of Breast Cancer: Left Breast Upper Outer  Quadrant  1.6cm   Histology per Pathology Report: Diagnosis 11/20/16: Breast, left, needle core biopsy, 12:00 o'clock - INVASIVE MAMMARY CARCINOMA  Receptor Status: ER(100%+), PR (5%+), Her2-neu (neg.ratio=1.15   ), Ki-(15%)  Did patient present with symptoms (if so, please note symptoms) or was this found on screening mammography?: patient found herself a lump,   Past/Anticipated interventions by surgeon, if ETK:[id number]/05:XG. Rolm Bookbinder, MD: 1. Breast, lumpectomy, Left - INVASIVE DUCTAL CARCINOMA, GRADE II/III, SPANNING 1.4 CM. - INVASIVE CARCINOMA IS BROADLY LESS THAN 0.1 CM TO THE SUPERIOR MARGIN OF SPECIMEN 1.- DUCTAL CARCINOMA IN SITU, INTERMEDIATE GRADE.- DUCTAL CARCINOMA IN SITU IS FOCALLY LESS THAN 0.1 CM TO THE MEDIAL MARGIN OF SPECIMEN 1.- LYMPHOVASCULAR INVASION IS IDENTIFIED.- LOBULAR NEOPLASIA (ATYPICAL LOBULAR HYPERPLASIA).- SEE ONCOLOGY TABLE BELOW. 2. Breast, excision, Left additional deep margin - FIBROCYSTIC CHANGES WITH ADENOSIS AND CALCIFICATIONS.- THERE IS NO EVIDENCE OF MALIGNANCY.- SEE COMMENT. 3. Breast, excision, Left additional medial margin - FIBROCYSTIC CHANGES WITH ADENOSIS AND CALCIFICATIONS.- THERE IS NO EVIDENCE OF MALIGNANCY.- SEE COMMENT. 4. Breast, excision, Left additional lateral margin - FIBROCYSTIC CHANGES WITH ADENOSIS.- THERE IS NO EVIDENCE OF MALIGNANCY.- SEE COMMENT. 5. Breast, excision, Left additional superior margin - FIBROCYSTIC CHANGES WITH ADENOSIS AND CALCIFICATIONS.- THERE IS NO EVIDENCE OF MALIGNANCY.- SEE COMMENT. 6. Lymph node, sentinel, biopsy, Left axillary - THERE IS NO EVIDENCE OF CARCINOMA IN 1 OF 1 LYMPH NODE (0/1).  Past/Anticipated interventions by medical oncology, if any: Chemotherapy : Dr. Jana Hakim, MD note 12/02/16:  Oncotype=24, follow up 03/15/17  Lymphedema issues, if any: No Pain issues, if any:  Tenderness in left breast  SAFETY ISSUES: No  Prior  radiation? NO  Pacemaker/ICD? NO  Is the patient on methotrexate? NO  Current Complaints / other details:  Menarche age 80, [id number] 1st live birth age 7, HRT  Combination for over 82 years, retired Passenger transport manager at [employer],   Depression, HTN,Hyperthyroidism,nodule on thyroid,radiaoactive,now hypothyroid on synthroid  Daughter DCIS Breast cancer, Mother CVA,  Died age 36, Father prostate cancer, died heart failure age 23, sister seizure disorder Maternal grandmother colon cancer,     Allergies: ASA-bleeding,,Amitriptyline-hallucinations, NSAIDS-lower Gi bleeding, Demerol-Migraines, Morphine & related=Rash, Fish allergy-Nausea only  Rebecca Eaton, RN 01/06/2017,11:13 AM  BP (!) 154/76   Pulse 84   Temp 98.6 F (37 C) (Oral)   Resp 20   Ht 5' 2"  (1.575 m)   Wt 176 lb 12.8 oz (80.2 kg)   BMI 32.34 kg/m   Wt Readings from Last 3 Encounters:  01/12/17 176 lb 12.8 oz (80.2 kg)  12/07/16 177 lb (80.3 kg)  12/02/16 177 lb 3.2 oz (80.4 kg)

## 2017-01-07 ENCOUNTER — Encounter: Payer: Self-pay | Admitting: Physical Therapy

## 2017-01-07 ENCOUNTER — Ambulatory Visit: Payer: PPO | Attending: General Surgery | Admitting: Physical Therapy

## 2017-01-07 DIAGNOSIS — R293 Abnormal posture: Secondary | ICD-10-CM | POA: Insufficient documentation

## 2017-01-07 DIAGNOSIS — Z170 Estrogen receptor positive status [ER+]: Secondary | ICD-10-CM | POA: Insufficient documentation

## 2017-01-07 DIAGNOSIS — M25612 Stiffness of left shoulder, not elsewhere classified: Secondary | ICD-10-CM | POA: Diagnosis not present

## 2017-01-07 DIAGNOSIS — C50412 Malignant neoplasm of upper-outer quadrant of left female breast: Secondary | ICD-10-CM | POA: Insufficient documentation

## 2017-01-07 NOTE — Therapy (Signed)
[place], [place], [address] Phone: [phone]   Fax:  [phone]  Physical Therapy Treatment  Patient Details  Name: [name] MRN: [id number] Date of Birth: February 14, 1940 Referring Provider: Dr. Rolm Bookbinder   Encounter Date: 01/07/2017  PT End of Session - 01/07/17 0846    Visit Number  2    Number of Visits  2    PT Start Time  0800    PT Stop Time  0835    PT Time Calculation (min)  35 min    Activity Tolerance  Patient tolerated treatment well    Behavior During Therapy  [facility] for tasks assessed/performed       Past Medical History:  Diagnosis Date  . Anxiety   . Arthritis    knees  . Cancer ([place]) 11/2016   Left breast cancer  . Depression   . Diverticulitis   . Diverticulosis    bleeding  . GERD (gastroesophageal reflux disease)   . Hypertension   . Hyperthyroidism    nodule on thyroid, Radioactive, now hypo  . Hypothyroidism, iatrogenic    After RI now hypo on synthroid    Past Surgical History:  Procedure Laterality Date  . ABDOMINAL HYSTERECTOMY  1977  . BLADDER SURGERY     bladder suspension  . BREAST LUMPECTOMY WITH RADIOACTIVE SEED AND SENTINEL LYMPH NODE BIOPSY Left 12/11/2016   Procedure: BREAST LUMPECTOMY WITH RADIOACTIVE SEED AND SENTINEL LYMPH NODE BIOPSY;  Surgeon: Rolm Bookbinder, MD;  Location: [place];  Service: General;  Laterality: Left;  . CHOLECYSTECTOMY N/A 03/29/2013   Procedure: LAPAROSCOPIC CHOLECYSTECTOMY WITH INTRAOPERATIVE CHOLANGIOGRAM;  Surgeon: Edward Jolly, MD;  Location: [place];  Service: General;  Laterality: N/A;  . JOINT REPLACEMENT Right 2009   total knee replacement  . ROTATOR CUFF REPAIR Left   . TOTAL KNEE REVISION  12/17/2010   Procedure: TOTAL KNEE REVISION;  Surgeon: Dione Plover Aluisio;  [facility];  Service: Orthopedics;  Laterality: Right;    There were no vitals filed for this  visit.  Subjective Assessment - 01/07/17 0803    Subjective  Patient reports she underwent a left lumpectomy and sentinel node biopsy (1 negative node) on 12/11/16. Her Oncotype score was low so no need for chemotherapy but will begin radiation next week. She will go on anti-estrogen therapy.    Pertinent History  Patient was diagnosed 11/17/16 with left grade 1 invasive ductal carcinoma breast cancer. It is ER/PR positive and HER2 negative with a Ki67 of 15%. It measured 1.6 cm and is located in the upper outer quadrant. She had a left rotator cuff repair in 2006 and has some residual deficits from that.    Patient Stated Goals  Make sure left arm is doing ok.    Currently in Pain?  Yes    Pain Score  3     Pain Location  Breast    Pain Orientation  Left    Pain Descriptors / Indicators  Tender    Pain Type  Surgical pain    Pain Onset  1 to 4 weeks ago    Pain Frequency  Intermittent    Aggravating Factors   Pressing on left breast    Pain Relieving Factors  Unknown    Multiple Pain Sites  No         [facility] PT Assessment - 01/07/17 0001      Assessment   Medical Diagnosis  s/p left  lumpectomy and SLNB    Referring Provider  Dr. Rolm Bookbinder    Onset Date/Surgical Date  12/11/16    Hand Dominance  Right    Prior Therapy  baseline assessment      Precautions   Precautions  Other (comment)    Precaution Comments  Recent left breast surgery; lymphedema risk      Restrictions   Weight Bearing Restrictions  No      Balance Screen   Has the patient fallen in the past 6 months  No    Has the patient had a decrease in activity level because of a fear of falling?   No    Is the patient reluctant to leave their home because of a fear of falling?   No      Home Film/video editor residence    Living Arrangements  Spouse/significant other    Available Help at Discharge  Family      Prior Function   Level of [place]   Retired    Leisure  She does not exercise      Cognition   Overall Cognitive Status  Within Functional Limits for tasks assessed      Posture/Postural Control   Posture/Postural Control  Postural limitations    Postural Limitations  Rounded Shoulders;Forward head      ROM / Strength   AROM / PROM / Strength  AROM;Strength      AROM   AROM Assessment Site  Shoulder;Cervical    Right/Left Shoulder  Right;Left    Left Shoulder Extension  52 Degrees    Left Shoulder Flexion  137 Degrees    Left Shoulder ABduction  134 Degrees    Left Shoulder Internal Rotation  61 Degrees    Left Shoulder External Rotation  84 Degrees      Palpation   Palpation comment  Incision sites in left breast and axilla appear to be well healed.        LYMPHEDEMA/ONCOLOGY QUESTIONNAIRE - 01/07/17 0813      Type   Cancer Type  s/p left lumpectomy and SLNB      Surgeries   Lumpectomy Date  12/11/16    Sentinel Lymph Node Biopsy Date  12/11/16    Number Lymph Nodes Removed  1      Treatment   Active Chemotherapy Treatment  No    Past Chemotherapy Treatment  No    Active Radiation Treatment  No    Past Radiation Treatment  No    Current Hormone Treatment  No    Past Hormone Therapy  No      What other symptoms do you have   Are you Having Heaviness or Tightness  No    Are you having Pain  Yes    Are you having pitting edema  No    Is it Hard or Difficult finding clothes that fit  No    Do you have infections  No    Is there Decreased scar mobility  Yes    Stemmer Sign  No      Right Upper Extremity Lymphedema   10 cm Proximal to Olecranon Process  29.1 cm    Olecranon Process  24.9 cm    10 cm Proximal to Ulnar Styloid Process  22.4 cm    Just Proximal to Ulnar Styloid Process  15.6 cm    Across Hand at [employer]  16.8 cm  At [facility] of 2nd Digit  6.2 cm      Left Upper Extremity Lymphedema   10 cm Proximal to Olecranon Process  30.2 cm    Olecranon Process  24.8 cm    10 cm  Proximal to Ulnar Styloid Process  23 cm    Just Proximal to Ulnar Styloid Process  15.1 cm    Across Hand at [employer]  16.5 cm    At [place] of 2nd Digit  5.9 cm                       PT Education - 02/04/2017 0846    Education provided  Yes    Education Details  Issued After Breast Cancer class information and discussed lymphedema risk reduction practices.    Person(s) Educated  Patient    Methods  Explanation;Handout    Comprehension  Verbalized understanding           Breast Clinic Goals - 12/02/16 2213      Patient will be able to verbalize understanding of pertinent lymphedema risk reduction practices relevant to her diagnosis specifically related to skin care.   Time  1    Period  Days    Status  Achieved      Patient will be able to return demonstrate and/or verbalize understanding of the post-op home exercise program related to regaining shoulder range of motion.   Time  1    Period  Days    Status  Achieved      Patient will be able to verbalize understanding of the importance of attending the postoperative After Breast Cancer Class for further lymphedema risk reduction education and therapeutic exercise.   Time  1    Period  Days    Status  Achieved       Long Term Clinic Goals - February 04, 2017 3299      CC Long Term Goal  #1   Title  Patient will demonstrate she has regained shoulder ROM and function post operatively when reassessed by PT.    Time  8    Period  Weeks    Status  Achieved         Plan - 2017/02/04 0846    Clinical Impression Statement  Patient is 4 weeks s/p left lumpectomy and sentinel node biopsy. She is doing extremely well and has regained all shoulder ROM. There are no signs of lymphedema. She would benefit from a regular exercise program to reduce recurrence risk but reports being uninterested in that. There is no further need for PT at this time.    Rehab Potential  Excellent    Clinical Impairments Affecting Rehab  Potential  None    PT Treatment/Interventions  ADLs/Self Care Home Management;Therapeutic exercise;Patient/family education    PT Next Visit Plan  Discharge pt    Consulted and Agree with Plan of Care  Patient       Patient will benefit from skilled therapeutic intervention in order to improve the following deficits and impairments:  Pain, Postural dysfunction, Decreased knowledge of precautions, Impaired UE functional use, Decreased range of motion  Visit Diagnosis: Malignant neoplasm of upper-outer quadrant of left breast in female, estrogen receptor positive (HCC)  Abnormal posture  Stiffness of left shoulder, not elsewhere classified   G-Codes - 2017/02/04 0849    Functional Assessment Tool Used (Outpatient Only)  Clinical Judgement    Functional Limitation  Other PT primary    Other PT Primary Goal  Status [phone])  At least 1 percent but less than 20 percent impaired, limited or restricted    Other PT Primary Discharge Status [phone])  At least 1 percent but less than 20 percent impaired, limited or restricted       Problem List Patient Active Problem List   Diagnosis Date Noted  . Malignant neoplasm of upper-outer quadrant of left breast in female, estrogen receptor positive ([place]) 12/01/2016  . Colitis 04/04/2015  . Acute colitis 04/04/2015  . Depression 01/10/2014  . Hyperlipidemia  pt repeatedly declines statins 12/13/2013  . Post herpetic neuralgia 04/24/2013  . Shingles 04/24/2013  . RUQ abdominal pain 03/28/2013  . Renal calculi 03/27/2013  . Diverticulosis 03/27/2013  . Other postablative hypothyroidism 12/26/2012  . Breast density 12/12/2012  . S/P hysterectomy 11/30/2011  . Multinodular goiter 11/30/2011  . Myalgia 11/30/2011  . GERD (gastroesophageal reflux disease) 11/30/2011  . Insomnia 07/23/2011  . History of anemia 05/10/2011  . History of herpes simplex infection 05/10/2011  . Anxiety 05/04/2011  . Essential hypertension, benign 05/04/2011  .  Menopause 05/04/2011  . Osteoporosis 05/04/2011  . DJD (degenerative joint disease) 05/04/2011  . Diverticulitis of sigmoid colon 07/29/2010    PHYSICAL THERAPY DISCHARGE SUMMARY  Visits from Start of Care: 2  Current functional level related to goals / functional outcomes: All goals met   Remaining deficits: none   Education / Equipment: Lymphedema risk reduction information and a HEP for shoulder ROM  Plan: Patient agrees to discharge.  Patient goals were met. Patient is being discharged due to meeting the stated rehab goals.  ?????        Annia Friendly, [place] 01/07/17 8:50 AM  [place], [place], [address] Phone: [phone]   Fax:  [phone]  Name: [name] MRN: [id number] Date of Birth: 12-31-1940

## 2017-01-12 ENCOUNTER — Encounter: Payer: Self-pay | Admitting: Radiation Oncology

## 2017-01-12 ENCOUNTER — Ambulatory Visit
Admission: RE | Admit: 2017-01-12 | Discharge: 2017-01-12 | Disposition: A | Discharge status: Home / Self Care | Payer: PPO | Source: Ambulatory Visit | Attending: Radiation Oncology | Admitting: Radiation Oncology

## 2017-01-12 DIAGNOSIS — Z90710 Acquired absence of both cervix and uterus: Secondary | ICD-10-CM | POA: Diagnosis not present

## 2017-01-12 DIAGNOSIS — Z170 Estrogen receptor positive status [ER+]: Secondary | ICD-10-CM | POA: Diagnosis not present

## 2017-01-12 DIAGNOSIS — Z9049 Acquired absence of other specified parts of digestive tract: Secondary | ICD-10-CM | POA: Diagnosis not present

## 2017-01-12 DIAGNOSIS — C50412 Malignant neoplasm of upper-outer quadrant of left female breast: Secondary | ICD-10-CM

## 2017-01-12 DIAGNOSIS — E890 Postprocedural hypothyroidism: Secondary | ICD-10-CM | POA: Diagnosis not present

## 2017-01-12 DIAGNOSIS — Z886 Allergy status to analgesic agent status: Secondary | ICD-10-CM | POA: Diagnosis not present

## 2017-01-12 DIAGNOSIS — M170 Bilateral primary osteoarthritis of knee: Secondary | ICD-10-CM | POA: Diagnosis not present

## 2017-01-12 DIAGNOSIS — Z510 Encounter for antineoplastic radiation therapy: Secondary | ICD-10-CM | POA: Diagnosis not present

## 2017-01-12 DIAGNOSIS — I10 Essential (primary) hypertension: Secondary | ICD-10-CM | POA: Diagnosis not present

## 2017-01-12 DIAGNOSIS — Z885 Allergy status to narcotic agent status: Secondary | ICD-10-CM | POA: Diagnosis not present

## 2017-01-12 DIAGNOSIS — Z96651 Presence of right artificial knee joint: Secondary | ICD-10-CM | POA: Diagnosis not present

## 2017-01-12 DIAGNOSIS — F329 Major depressive disorder, single episode, unspecified: Secondary | ICD-10-CM | POA: Diagnosis not present

## 2017-01-12 DIAGNOSIS — Z9889 Other specified postprocedural states: Secondary | ICD-10-CM | POA: Diagnosis not present

## 2017-01-12 DIAGNOSIS — K219 Gastro-esophageal reflux disease without esophagitis: Secondary | ICD-10-CM | POA: Diagnosis not present

## 2017-01-12 DIAGNOSIS — Z8379 Family history of other diseases of the digestive system: Secondary | ICD-10-CM | POA: Diagnosis not present

## 2017-01-12 DIAGNOSIS — Z91013 Allergy to seafood: Secondary | ICD-10-CM | POA: Diagnosis not present

## 2017-01-12 DIAGNOSIS — Z79899 Other long term (current) drug therapy: Secondary | ICD-10-CM | POA: Diagnosis not present

## 2017-01-12 DIAGNOSIS — Z79890 Hormone replacement therapy (postmenopausal): Secondary | ICD-10-CM | POA: Diagnosis not present

## 2017-01-12 DIAGNOSIS — Z8249 Family history of ischemic heart disease and other diseases of the circulatory system: Secondary | ICD-10-CM | POA: Diagnosis not present

## 2017-01-12 DIAGNOSIS — Z803 Family history of malignant neoplasm of breast: Secondary | ICD-10-CM | POA: Diagnosis not present

## 2017-01-12 NOTE — Progress Notes (Signed)
.  Radiation Oncology         [phone] ________________________________  Name: [name]        MRN: [id number]  Date of Service: 01/12/2017 DOB: 1940/01/30  [id number], [name], MD  Rolm Bookbinder, MD     REFERRING PHYSICIAN: Rolm Bookbinder, MD   DIAGNOSIS: The encounter diagnosis was Malignant neoplasm of upper-outer quadrant of left breast in female, estrogen receptor positive ([place]).   HISTORY OF PRESENT ILLNESS: [name] is a 76 y.o. female is seen today for a consultation. She was seen in the multidisciplinary breast clinic for a new diagnosis of left breast cancer on 12/02/16. The patient was noted to have a palpable mass in the left breast. Diagnostic imaging revealed a 1.6 x 1.4 x .9 cm at 12:00. Her axilla was negative by ultrasound also. She underwent biopsy on 11/20/16 and final pathology revealed a grade 1 invasive ductal carcinoma, ER/PR positive, HER2 negative, with a Ki 67 of 15%. She comes today to discuss options of treatment for her breast cancer. Patient had Left breast seed guided lumpectomy and left deep axillary sentinel node biopsy on 12/11/16 by Dr. Donne Hazel. Final margins were clear and oncotype dx testing indicated no chemotherapy was needed.   Patient described feeling well after surgery and is ready to start with radiation. She described no swelling or implications. She also described that her incision feels tender but overall feels well.    PREVIOUS RADIATION THERAPY: No   PAST MEDICAL HISTORY:  Past Medical History:  Diagnosis Date  . Anxiety   . Arthritis    knees  . Cancer ([place]) 11/2016   Left breast cancer  . Depression   . Diverticulitis   . Diverticulosis    bleeding  . GERD (gastroesophageal reflux disease)   . Hypertension   . Hyperthyroidism    nodule on thyroid, Radioactive, now hypo  . Hypothyroidism, iatrogenic    After RI now hypo on synthroid       PAST SURGICAL HISTORY: Past Surgical History:    Procedure Laterality Date  . ABDOMINAL HYSTERECTOMY  1977  . BLADDER SURGERY     bladder suspension  . BREAST LUMPECTOMY WITH RADIOACTIVE SEED AND SENTINEL LYMPH NODE BIOPSY Left 12/11/2016   Procedure: BREAST LUMPECTOMY WITH RADIOACTIVE SEED AND SENTINEL LYMPH NODE BIOPSY;  Surgeon: Rolm Bookbinder, MD;  Location: [facility];  Service: General;  Laterality: Left;  . CHOLECYSTECTOMY N/A 03/29/2013   Procedure: LAPAROSCOPIC CHOLECYSTECTOMY WITH INTRAOPERATIVE CHOLANGIOGRAM;  Surgeon: Edward Jolly, MD;  Location: [facility];  Service: General;  Laterality: N/A;  . JOINT REPLACEMENT Right 2009   total knee replacement  . ROTATOR CUFF REPAIR Left   . TOTAL KNEE REVISION  12/17/2010   Procedure: TOTAL KNEE REVISION;  Surgeon: Dione Plover Aluisio;  Location[facility];  Service: Orthopedics;  Laterality: Right;     FAMILY HISTORY:  Family History  Problem Relation Age of Onset  . Diverticulitis Mother   . Colon cancer Maternal Grandmother   . Heart attack Father   . Breast cancer Daughter      SOCIAL HISTORY:  reports that  has never smoked. she has never used smokeless tobacco. She reports that she drinks alcohol. She reports that she does not use drugs. The patient is married and lives in [place]. She's a retired Theatre stage manager.   ALLERGIES: Amitriptyline; Aspirin; Nsaids; Demerol [meperidine]; and Morphine and related   MEDICATIONS:  Current Outpatient Medications  Medication Sig Dispense  Refill  . enalapril (VASOTEC) 10 MG tablet Take 10 mg daily by mouth.    . escitalopram (LEXAPRO) 10 MG tablet Take 10 mg daily by mouth.    Marland Kitchen glimepiride (AMARYL) 1 MG tablet Take 1 mg daily with breakfast by mouth.    . hydrochlorothiazide (MICROZIDE) 12.5 MG capsule Take 12.5 mg daily by mouth.    . Levothyroxine Sodium 100 MCG CAPS Take 100 mcg daily by mouth.   1  . omeprazole (PRILOSEC) 40 MG capsule Take 40 mg daily by mouth.    Marland Kitchen OVER THE COUNTER MEDICATION Take 1  tablet by mouth at bedtime as needed ([place]).    . rosuvastatin (CRESTOR) 5 MG tablet Take 1 tablet by mouth as directed. Monday Wednesday friday  0  . traMADol (ULTRAM) 50 MG tablet Take 2 tablets (100 mg total) every 6 (six) hours as needed by mouth. 12 tablet 0   No current facility-administered medications for this encounter.      REVIEW OF SYSTEMS: On review of systems, the patient reports that she is doing well overall. She denies any chest pain, shortness of breath, cough, fevers, chills, night sweats, unintended weight changes. She denies any bowel or bladder disturbances, and denies abdominal pain, nausea or vomiting. She denies any new musculoskeletal or joint aches or pains. A complete review of systems is obtained and is otherwise negative.     PHYSICAL EXAM:  Vitals:   01/12/17 0821  BP: (!) 154/76  Pulse: 84  Resp: 20  Temp: 98.6 F (37 C)    In general this is a well appearing caucasian female in no acute distress. She is alert and oriented x4 and appropriate throughout the examination. HEENT reveals that the patient is normocephalic, atraumatic. EOMs are intact.   ECOG = 0  0 - Asymptomatic (Fully active, able to carry on all predisease activities without restriction)  1 - Symptomatic but completely ambulatory (Restricted in physically strenuous activity but ambulatory and able to carry out work of a light or sedentary nature. For example, light housework, office work)  2 - Symptomatic, <50% in bed during the day (Ambulatory and capable of all self care but unable to carry out any work activities. Up and about more than 50% of waking hours)  3 - Symptomatic, >50% in bed, but not bedbound (Capable of only limited self-care, confined to bed or chair 50% or more of waking hours)  4 - Bedbound (Completely disabled. Cannot carry on any self-care. Totally confined to bed or chair)  5 - Death   Eustace Pen MM, Creech RH, Tormey DC, et al. [phone]). "Toxicity and response  criteria of the [facility]". [place] Oncol. 5 (6): 649-55    LABORATORY DATA:  Lab Results  Component Value Date   WBC 9.0 12/02/2016   HGB 13.1 12/02/2016   HCT 39.4 12/02/2016   MCV 86.3 12/02/2016   PLT 250 12/02/2016   Lab Results  Component Value Date   NA 140 12/02/2016   K 3.8 12/02/2016   CL 99 (L) 08/10/2015   CO2 24 12/02/2016   Lab Results  Component Value Date   ALT 16 12/02/2016   AST 15 12/02/2016   ALKPHOS 89 12/02/2016   BILITOT 0.46 12/02/2016      RADIOGRAPHY: Dg Epidural/nerve Root  Result Date: 01/01/2017 CLINICAL DATA:  Lumbosacral spondylosis without myelopathy. LEFT leg radicular symptoms predominate. EXAM: EPIDURAL/NERVE ROOT FLUOROSCOPY TIME:  23 seconds corresponding to a Dose Area Product of 38.85  Gy*m2 PROCEDURE: The procedure, risks, benefits, and alternatives were explained to the patient. Questions regarding the procedure were encouraged and answered. The patient understands and consents to the procedure. LEFT L5 NERVE ROOT BLOCK AND TRANSFORAMINAL EPIDURAL: A posterior oblique approach was taken to the intervertebral foramen on the LEFT at L5-S1 using a curved 22 gauge spinal needle. Injection of Isovue-M 200 outlined the LEFT L5 nerve root and showed good epidural spread. No vascular opacification is seen. 120.2 mg of Depo-Medrol mixed with 2 mL 1% lidocaine were instilled. The procedure was well-tolerated, and the patient was discharged thirty minutes following the injection in good condition. COMPLICATIONS: None IMPRESSION: Technically successful injection consisting of a LEFT L5 nerve root block and transforaminal epidural. Electronically Signed   By: Staci Righter M.D.   On: 01/01/2017 12:16       IMPRESSION/PLAN: 1. Stage IA, cT1cN0M0 grade 1, ER/ PR positive invasive ductal carcinoma of the left breast. I discussed the pathology findings and reviewed the nature of invasive breast disease. Patient's left breast  lumpectomy and sentinel node mapping went well. Oncotype dx score which provided that chemotherapy is not indicated. The patient's course would then be followed by external radiotherapy to the breast followed by antiestrogen therapy. We discussed the risks, benefits, short, and long term effects of radiotherapy, and the patient is interested in proceeding. Dr. Lisbeth Renshaw discusses the delivery and logistics of radiotherapy and anticipates a course of 4 weeks with deep inspiration breath hold technique. We will see her back about 2 weeks after surgery to move forward with the simulation and planning process and anticipate starting radiotherapy about 4 weeks after surgery.    The patient was seen today for 25 minutes, with the majority of the time spent counseling the patient on his diagnosis of cancer and coordinating his care.   ------------------------------------------------  Jodelle Gross, MD, PhD   This document serves as a record of services personally performed by Kyung Rudd MD. It was created on his behalf by Delton Coombes, a trained medical scribe. The creation of this record is based on the scribe's personal observations and the provider's statements to them.

## 2017-01-12 NOTE — Progress Notes (Signed)
Please see the Nurse Progress Note in the MD Initial Consult Encounter for this patient. 

## 2017-01-12 NOTE — Addendum Note (Signed)
Encounter addended by: Doreen Beam, RN on: 01/12/2017 9:24 AM  Actions taken: Patient Education assessment filed

## 2017-01-15 ENCOUNTER — Ambulatory Visit
Admission: RE | Admit: 2017-01-15 | Discharge: 2017-01-15 | Disposition: A | Discharge status: Home / Self Care | Payer: PPO | Source: Ambulatory Visit | Attending: Radiation Oncology | Admitting: Radiation Oncology

## 2017-01-15 DIAGNOSIS — C50412 Malignant neoplasm of upper-outer quadrant of left female breast: Secondary | ICD-10-CM | POA: Diagnosis not present

## 2017-01-15 DIAGNOSIS — Z170 Estrogen receptor positive status [ER+]: Principal | ICD-10-CM

## 2017-01-15 DIAGNOSIS — Z510 Encounter for antineoplastic radiation therapy: Secondary | ICD-10-CM | POA: Diagnosis not present

## 2017-01-17 NOTE — Progress Notes (Signed)
  Radiation Oncology         [phone] ________________________________  Name: [name] MRN: [id number]  Date: 01/15/2017  DOB: Nov 30, 1940  Optical Surface Tracking Plan:  Since intensity modulated radiotherapy (IMRT) and 3D conformal radiation treatment methods are predicated on accurate and precise positioning for treatment, intrafraction motion monitoring is medically necessary to ensure accurate and safe treatment delivery.  The ability to quantify intrafraction motion without excessive ionizing radiation dose can only be performed with optical surface tracking. Accordingly, surface imaging offers the opportunity to obtain 3D measurements of patient position throughout IMRT and 3D treatments without excessive radiation exposure.  I am ordering optical surface tracking for this patient's upcoming course of radiotherapy. ________________________________  Kyung Rudd, MD 01/17/2017 5:23 PM    Reference:   Particia Jasper, et al. Surface imaging-based analysis of intrafraction motion for breast radiotherapy patients.Journal of [place], n. 6, nov. 2014. ISSN [id number].   Available at: <http://www.jacmp.org/index.php/jacmp/article/view/4957>.

## 2017-01-17 NOTE — Progress Notes (Signed)
  Radiation Oncology         (336) [phone] ________________________________  Name: [name] MRN: [id number]  Date: 01/15/2017  DOB: 1940/12/02   DIAGNOSIS:     ICD-10-CM   1. Malignant neoplasm of upper-outer quadrant of left breast in female, estrogen receptor positive ([place]) C50.412    Z17.0     SIMULATION AND TREATMENT PLANNING NOTE  The patient presented for simulation prior to beginning her course of radiation treatment for her diagnosis of left-sided breast cancer. The patient was placed in a supine position on a breast board. A customized vac-lock bag was constructed and this complex treatment device will be used on a daily basis during her treatment. In this fashion, a CT scan was obtained through the chest area and an isocenter was placed near the chest wall within the breast.  The patient will be planned to receive a course of radiation initially to a dose of 42.5 Gy. This will consist of a whole breast radiotherapy technique. To accomplish this, 2 customized blocks have been designed which will correspond to medial and lateral whole breast tangent fields. This treatment will be accomplished at 2.5 Gy per fraction. A forward planning technique will also be evaluated to determine if this approach improves the plan. It is anticipated that the patient will then receive a 7.5 Gy boost to the seroma cavity which has been contoured. This will be accomplished at 2.5 Gy per fraction.   This initial treatment will consist of a 3-D conformal technique. The seroma has been contoured as the primary target structure. Additionally, dose volume histograms of both this target as well as the lungs and heart will also be evaluated. Such an approach is necessary to ensure that the target area is adequately covered while the nearby critical  normal structures are adequately spared.  Plan:  The final anticipated total dose therefore will correspond to 50 Gy.   Special treatment procedure was  performed today due to the extra time and effort required by myself to plan and prepare this patient for deep inspiration breath hold technique.  I have determined cardiac sparing to be of benefit to this patient to prevent long term cardiac damage due to radiation of the heart.  Bellows were placed on the patient's abdomen. To facilitate cardiac sparing, the patient was coached by the radiation therapists on breath hold techniques and breathing practice was performed. Practice waveforms were obtained. The patient was then scanned while maintaining breath hold in the treatment position.  This image was then transferred over to the imaging specialist. The imaging specialist then created a fusion of the free breathing and breath hold scans using the chest wall as the stable structure. I personally reviewed the fusion in axial, coronal and sagittal image planes.  Excellent cardiac sparing was obtained.  I felt the patient is an appropriate candidate for breath hold and the patient will be treated as such.  The image fusion was then reviewed with the patient to reinforce the necessity of reproducible breath hold.     _______________________________   Jodelle Gross, MD, PhD

## 2017-01-20 DIAGNOSIS — C50412 Malignant neoplasm of upper-outer quadrant of left female breast: Secondary | ICD-10-CM | POA: Diagnosis not present

## 2017-01-20 DIAGNOSIS — Z510 Encounter for antineoplastic radiation therapy: Secondary | ICD-10-CM | POA: Diagnosis not present

## 2017-01-27 ENCOUNTER — Ambulatory Visit
Admission: RE | Admit: 2017-01-27 | Discharge: 2017-01-27 | Disposition: A | Discharge status: Home / Self Care | Payer: PPO | Source: Ambulatory Visit | Attending: Radiation Oncology | Admitting: Radiation Oncology

## 2017-01-27 DIAGNOSIS — Z510 Encounter for antineoplastic radiation therapy: Secondary | ICD-10-CM | POA: Diagnosis not present

## 2017-01-27 DIAGNOSIS — C50412 Malignant neoplasm of upper-outer quadrant of left female breast: Secondary | ICD-10-CM | POA: Diagnosis not present

## 2017-01-28 ENCOUNTER — Ambulatory Visit
Admission: RE | Admit: 2017-01-28 | Discharge: 2017-01-28 | Disposition: A | Discharge status: Home / Self Care | Payer: PPO | Source: Ambulatory Visit | Attending: Radiation Oncology | Admitting: Radiation Oncology

## 2017-01-28 DIAGNOSIS — C50412 Malignant neoplasm of upper-outer quadrant of left female breast: Secondary | ICD-10-CM | POA: Diagnosis not present

## 2017-01-28 DIAGNOSIS — Z510 Encounter for antineoplastic radiation therapy: Secondary | ICD-10-CM | POA: Diagnosis not present

## 2017-01-29 ENCOUNTER — Ambulatory Visit
Admission: RE | Admit: 2017-01-29 | Discharge: 2017-01-29 | Disposition: A | Discharge status: Home / Self Care | Payer: PPO | Source: Ambulatory Visit | Attending: Radiation Oncology | Admitting: Radiation Oncology

## 2017-01-29 DIAGNOSIS — Z170 Estrogen receptor positive status [ER+]: Principal | ICD-10-CM

## 2017-01-29 DIAGNOSIS — C50412 Malignant neoplasm of upper-outer quadrant of left female breast: Secondary | ICD-10-CM | POA: Diagnosis not present

## 2017-01-29 DIAGNOSIS — Z510 Encounter for antineoplastic radiation therapy: Secondary | ICD-10-CM | POA: Diagnosis not present

## 2017-01-29 MED ORDER — RADIAPLEXRX EX GEL
Freq: Once | CUTANEOUS | Status: AC
  Administered 2017-01-29: 17:00:00 via TOPICAL

## 2017-01-29 MED ORDER — ALRA NON-METALLIC DEODORANT (RAD-ONC)
1.0000 "application " | Freq: Once | TOPICAL | Status: AC
  Administered 2017-01-29: 1 via TOPICAL

## 2017-01-29 NOTE — Progress Notes (Signed)

## 2017-02-01 ENCOUNTER — Ambulatory Visit
Admission: RE | Admit: 2017-02-01 | Discharge: 2017-02-01 | Disposition: A | Discharge status: Home / Self Care | Payer: PPO | Source: Ambulatory Visit | Attending: Radiation Oncology | Admitting: Radiation Oncology

## 2017-02-01 DIAGNOSIS — Z510 Encounter for antineoplastic radiation therapy: Secondary | ICD-10-CM | POA: Diagnosis not present

## 2017-02-01 DIAGNOSIS — C50412 Malignant neoplasm of upper-outer quadrant of left female breast: Secondary | ICD-10-CM | POA: Diagnosis not present

## 2017-02-02 ENCOUNTER — Ambulatory Visit
Admission: RE | Admit: 2017-02-02 | Discharge: 2017-02-02 | Disposition: A | Discharge status: Home / Self Care | Payer: PPO | Source: Ambulatory Visit | Attending: Radiation Oncology | Admitting: Radiation Oncology

## 2017-02-02 DIAGNOSIS — Z510 Encounter for antineoplastic radiation therapy: Secondary | ICD-10-CM | POA: Diagnosis not present

## 2017-02-02 DIAGNOSIS — C50412 Malignant neoplasm of upper-outer quadrant of left female breast: Secondary | ICD-10-CM | POA: Diagnosis not present

## 2017-02-03 ENCOUNTER — Ambulatory Visit
Admission: RE | Admit: 2017-02-03 | Discharge: 2017-02-03 | Disposition: A | Discharge status: Home / Self Care | Payer: PPO | Source: Ambulatory Visit | Attending: Radiation Oncology | Admitting: Radiation Oncology

## 2017-02-03 DIAGNOSIS — C50412 Malignant neoplasm of upper-outer quadrant of left female breast: Secondary | ICD-10-CM | POA: Diagnosis not present

## 2017-02-03 DIAGNOSIS — Z510 Encounter for antineoplastic radiation therapy: Secondary | ICD-10-CM | POA: Diagnosis not present

## 2017-02-04 ENCOUNTER — Ambulatory Visit
Admission: RE | Admit: 2017-02-04 | Discharge: 2017-02-04 | Disposition: A | Discharge status: Home / Self Care | Payer: PPO | Source: Ambulatory Visit | Attending: Radiation Oncology | Admitting: Radiation Oncology

## 2017-02-04 DIAGNOSIS — Z510 Encounter for antineoplastic radiation therapy: Secondary | ICD-10-CM | POA: Diagnosis not present

## 2017-02-04 DIAGNOSIS — C50412 Malignant neoplasm of upper-outer quadrant of left female breast: Secondary | ICD-10-CM | POA: Diagnosis not present

## 2017-02-05 ENCOUNTER — Ambulatory Visit
Admission: RE | Admit: 2017-02-05 | Discharge: 2017-02-05 | Disposition: A | Discharge status: Home / Self Care | Payer: PPO | Source: Ambulatory Visit | Attending: Radiation Oncology | Admitting: Radiation Oncology

## 2017-02-05 DIAGNOSIS — Z510 Encounter for antineoplastic radiation therapy: Secondary | ICD-10-CM | POA: Diagnosis not present

## 2017-02-05 DIAGNOSIS — C50412 Malignant neoplasm of upper-outer quadrant of left female breast: Secondary | ICD-10-CM | POA: Diagnosis not present

## 2017-02-08 ENCOUNTER — Ambulatory Visit
Admission: RE | Admit: 2017-02-08 | Discharge: 2017-02-08 | Disposition: A | Discharge status: Home / Self Care | Payer: PPO | Source: Ambulatory Visit | Attending: Radiation Oncology | Admitting: Radiation Oncology

## 2017-02-08 DIAGNOSIS — Z510 Encounter for antineoplastic radiation therapy: Secondary | ICD-10-CM | POA: Diagnosis not present

## 2017-02-08 DIAGNOSIS — C50412 Malignant neoplasm of upper-outer quadrant of left female breast: Secondary | ICD-10-CM | POA: Diagnosis not present

## 2017-02-09 ENCOUNTER — Ambulatory Visit
Admission: RE | Admit: 2017-02-09 | Discharge: 2017-02-09 | Disposition: A | Discharge status: Home / Self Care | Payer: PPO | Source: Ambulatory Visit | Attending: Radiation Oncology | Admitting: Radiation Oncology

## 2017-02-09 DIAGNOSIS — Z510 Encounter for antineoplastic radiation therapy: Secondary | ICD-10-CM | POA: Diagnosis not present

## 2017-02-09 DIAGNOSIS — C50412 Malignant neoplasm of upper-outer quadrant of left female breast: Secondary | ICD-10-CM | POA: Diagnosis not present

## 2017-02-10 ENCOUNTER — Ambulatory Visit
Admission: RE | Admit: 2017-02-10 | Discharge: 2017-02-10 | Disposition: A | Discharge status: Home / Self Care | Payer: PPO | Source: Ambulatory Visit | Attending: Radiation Oncology | Admitting: Radiation Oncology

## 2017-02-10 DIAGNOSIS — Z510 Encounter for antineoplastic radiation therapy: Secondary | ICD-10-CM | POA: Diagnosis not present

## 2017-02-10 DIAGNOSIS — C50412 Malignant neoplasm of upper-outer quadrant of left female breast: Secondary | ICD-10-CM | POA: Diagnosis not present

## 2017-02-11 ENCOUNTER — Ambulatory Visit
Admission: RE | Admit: 2017-02-11 | Discharge: 2017-02-11 | Disposition: A | Discharge status: Home / Self Care | Payer: PPO | Source: Ambulatory Visit | Attending: Radiation Oncology | Admitting: Radiation Oncology

## 2017-02-11 DIAGNOSIS — C50412 Malignant neoplasm of upper-outer quadrant of left female breast: Secondary | ICD-10-CM | POA: Diagnosis not present

## 2017-02-11 DIAGNOSIS — Z510 Encounter for antineoplastic radiation therapy: Secondary | ICD-10-CM | POA: Diagnosis not present

## 2017-02-12 ENCOUNTER — Ambulatory Visit: Payer: PPO | Admitting: Radiation Oncology

## 2017-02-12 ENCOUNTER — Ambulatory Visit
Admission: RE | Admit: 2017-02-12 | Discharge: 2017-02-12 | Disposition: A | Discharge status: Home / Self Care | Payer: PPO | Source: Ambulatory Visit | Attending: Radiation Oncology | Admitting: Radiation Oncology

## 2017-02-12 DIAGNOSIS — Z510 Encounter for antineoplastic radiation therapy: Secondary | ICD-10-CM | POA: Diagnosis not present

## 2017-02-12 DIAGNOSIS — C50412 Malignant neoplasm of upper-outer quadrant of left female breast: Secondary | ICD-10-CM | POA: Diagnosis not present

## 2017-02-15 ENCOUNTER — Ambulatory Visit
Admission: RE | Admit: 2017-02-15 | Discharge: 2017-02-15 | Disposition: A | Discharge status: Home / Self Care | Payer: PPO | Source: Ambulatory Visit | Attending: Radiation Oncology | Admitting: Radiation Oncology

## 2017-02-15 DIAGNOSIS — C50412 Malignant neoplasm of upper-outer quadrant of left female breast: Secondary | ICD-10-CM | POA: Diagnosis not present

## 2017-02-15 DIAGNOSIS — Z510 Encounter for antineoplastic radiation therapy: Secondary | ICD-10-CM | POA: Diagnosis not present

## 2017-02-16 ENCOUNTER — Ambulatory Visit
Admission: RE | Admit: 2017-02-16 | Discharge: 2017-02-16 | Disposition: A | Discharge status: Home / Self Care | Payer: PPO | Source: Ambulatory Visit | Attending: Radiation Oncology | Admitting: Radiation Oncology

## 2017-02-16 DIAGNOSIS — Z510 Encounter for antineoplastic radiation therapy: Secondary | ICD-10-CM | POA: Diagnosis not present

## 2017-02-16 DIAGNOSIS — C50412 Malignant neoplasm of upper-outer quadrant of left female breast: Secondary | ICD-10-CM | POA: Diagnosis not present

## 2017-02-17 ENCOUNTER — Ambulatory Visit
Admission: RE | Admit: 2017-02-17 | Discharge: 2017-02-17 | Disposition: A | Discharge status: Home / Self Care | Payer: PPO | Source: Ambulatory Visit | Attending: Radiation Oncology | Admitting: Radiation Oncology

## 2017-02-17 DIAGNOSIS — C50412 Malignant neoplasm of upper-outer quadrant of left female breast: Secondary | ICD-10-CM | POA: Diagnosis not present

## 2017-02-17 DIAGNOSIS — Z510 Encounter for antineoplastic radiation therapy: Secondary | ICD-10-CM | POA: Diagnosis not present

## 2017-02-18 ENCOUNTER — Ambulatory Visit
Admission: RE | Admit: 2017-02-18 | Discharge: 2017-02-18 | Disposition: A | Discharge status: Home / Self Care | Payer: PPO | Source: Ambulatory Visit | Attending: Radiation Oncology | Admitting: Radiation Oncology

## 2017-02-18 DIAGNOSIS — C50412 Malignant neoplasm of upper-outer quadrant of left female breast: Secondary | ICD-10-CM | POA: Diagnosis not present

## 2017-02-18 DIAGNOSIS — Z510 Encounter for antineoplastic radiation therapy: Secondary | ICD-10-CM | POA: Diagnosis not present

## 2017-02-19 ENCOUNTER — Ambulatory Visit
Admission: RE | Admit: 2017-02-19 | Discharge: 2017-02-19 | Disposition: A | Discharge status: Home / Self Care | Payer: PPO | Source: Ambulatory Visit | Attending: Radiation Oncology | Admitting: Radiation Oncology

## 2017-02-19 DIAGNOSIS — C50412 Malignant neoplasm of upper-outer quadrant of left female breast: Secondary | ICD-10-CM | POA: Diagnosis not present

## 2017-02-19 DIAGNOSIS — Z510 Encounter for antineoplastic radiation therapy: Secondary | ICD-10-CM | POA: Diagnosis not present

## 2017-02-22 ENCOUNTER — Ambulatory Visit
Admission: RE | Admit: 2017-02-22 | Discharge: 2017-02-22 | Disposition: A | Discharge status: Home / Self Care | Payer: PPO | Source: Ambulatory Visit | Attending: Radiation Oncology | Admitting: Radiation Oncology

## 2017-02-22 DIAGNOSIS — Z510 Encounter for antineoplastic radiation therapy: Secondary | ICD-10-CM | POA: Diagnosis not present

## 2017-02-23 ENCOUNTER — Ambulatory Visit
Admission: RE | Admit: 2017-02-23 | Discharge: 2017-02-23 | Disposition: A | Discharge status: Home / Self Care | Payer: PPO | Source: Ambulatory Visit | Attending: Radiation Oncology | Admitting: Radiation Oncology

## 2017-02-23 ENCOUNTER — Encounter: Payer: Self-pay | Admitting: Radiation Oncology

## 2017-02-23 DIAGNOSIS — C50412 Malignant neoplasm of upper-outer quadrant of left female breast: Secondary | ICD-10-CM | POA: Diagnosis not present

## 2017-02-23 DIAGNOSIS — Z510 Encounter for antineoplastic radiation therapy: Secondary | ICD-10-CM | POA: Diagnosis not present

## 2017-02-25 NOTE — Progress Notes (Signed)
  Radiation Oncology         [phone] ________________________________  Name: [name] MRN: [id number]  Date: 02/23/2017  DOB: Nov 27, 1940  End of Treatment Note  Diagnosis:   77 y.o. female with Stage IA, cT1cN0M0 grade 1, ER/ PR positive invasive ductal carcinoma of the left breast   Indication for treatment:  Curative       Radiation treatment dates:   01/27/2017 - 02/23/2017  Site/dose:   The patient initially received a dose of 42.5 Gy in 17 fractions to the breast using whole-breast tangent fields. This was delivered using a 3-D conformal technique. The patient then received a boost to the seroma. This delivered an additional 7.5 Gy in 3 fractions using a 3 field photon technique due to the depth of the seroma. The total dose was 50 Gy.  Narrative: The patient tolerated radiation treatment relatively well.   The patient had some expected fatigue and skin irritation as she progressed during treatment. Moist desquamation was not present at the end of treatment.  Plan: The patient has completed radiation treatment. The patient will return to radiation oncology clinic for routine followup in one month. I advised the patient to call or return sooner if they have any questions or concerns related to their recovery or treatment. ________________________________  Jodelle Gross, MD, PhD  This document serves as a record of services personally performed by Kyung Rudd, MD. It was created on his behalf by Rae Lips, a trained medical scribe. The creation of this record is based on the scribe's personal observations and the provider's statements to them. This document has been checked and approved by the attending provider.

## 2017-03-08 NOTE — Progress Notes (Signed)
[facility]  Telephone:([phone] Fax:(336) [phone]   NB: Patient did not show for he 03/15/2017 appt  ID: [name] DOB: 1940/04/20  MR#: [id number]  ZGY#:[id number]  Patient Care Team: Lanice Shirts, MD as PCP - General (Internal Medicine) Jerrilynn Mikowski, Virgie Dad, MD as Consulting Physician (Oncology) Roseanne Kaufman, MD as Consulting Physician (Orthopedic Surgery) Juanita Craver, MD as Consulting Physician (Gastroenterology) Jari Pigg, MD as Consulting Physician (Dermatology) Kathie Rhodes, MD as Consulting Physician (Urology) Kyung Rudd, MD as Consulting Physician (Radiation Oncology) Rolm Bookbinder, MD as Consulting Physician (General Surgery) OTHER MD:  CHIEF COMPLAINT: estrogen receptor positive breast cancer  CURRENT TREATMENT: To start antiestrogen therapy   HISTORY OF CURRENT ILLNESS:  From the original intake note:  [name] initially palpated a lump to her left breast while watching TV which she brought to medical attention and was evaluated 1 week following by her PCP, Dr. Coralyn Mark. She then had an unilateral diagnostic left mammography with tomography and CAD and left breast ultrasonography at [facility] on 11/17/2016 showing a 1.6 cm mass in the upper left breast suspicous for malignancy. The left axilla was benign.  Accordingly on 11/20/2016, she proceeded to biopsy of the left breast area in question. The pathology from this procedure showed ([id number]): invasive mammary carcinoma. Prognostic indicators: ER: 100% positive; PR: 5% positive; Both with strong staining intensity. Proliferation marker Ki67: 15% ; HER-2: negative.  The patient's subsequent history is as detailed below.  INTERVAL HISTORY: [name]  Since her last visit to the office, she completed radiation with Dr. Lisbeth Renshaw with treatment dates of: 01/27/2017 - 02/23/2017 to site/dose: The patient initially received a dose of 42.5 Gy in 17 fractions to the breast using  whole-breast tangent fields. This was delivered using a 3-D conformal technique. The patient then received a boost to the seroma. This delivered an additional 7.5 Gy in 3 fractions using a 3 field photon technique due to the depth of the seroma. The total dose was 50 Gy.  She also had left breast lumpectomy ([id number]) completed on 12/11/2016 with results of: 1. Breast, lumpectomy, Left with invasive ductal carcinoma, grade II/III, spanning 1.4 cm. Invasive carcinoma is broadly less then 0.1 cm to the superior margin of specimen 1. Ductal carcinoma in situ, intermediate grade. Ductal carcinoma in situ is focally less than 0.1 cm to the medial margin of specimen 1. Lymphovascular invasion is identified. Lobular neoplasia (atypical lobular hyperplasia). 2. Breast, excision, Left additional deep margin with fibrocystic changes with adenosis and calcifications. There is no evidence of malignancy. 3. Breast, excision, Left additional medial margin with fibrocystic changes with adenosis and calcifications. There is no evidence of malignancy. 4. Breast, excision, Left additional lateral margin with fibrocystic changes with adenosis and calcifications. There is no evidence of malignancy. 5. Breast, excision, Left additional superior margin with fibrocystic changes with adenosis and calcifications. There is no evidence of malignancy. 6. Lymph node, sentinel, biopsy, Left axillary with no evidence of carcinoma in 1 of 1 lymph node (0/1).  ROS:   STUDIES: No results found.  ELIGIBLE FOR AVAILABLE RESEARCH PROTOCOL: no  ASSESSMENT: 77 y.o. [place] woman s/p left breast upper outer quadrant biopsy 11/20/2016 for a clinical T1c N0, stage IA invasive ductal carcinoma, grade 1, estrogen and progesterone receptor positive, HER-2 not amplified, with an Mib-1 of 15%  (1) s/p left lumpectomy with sentinel lymph node sampling 12/11/2016 for a pT1c pN0 invasive ductal carcinoma, grade 2, with negative margins  (2)  oncotype score of  24 predicted a 10-year risk of recurrence outside the breast of 16% if the patient's only systemic therapy was tamoxifen for 5 years.  It also predicted no significant benefit from chemotherapy.  (3) adjuvant radiation 01/27/2017 - 02/23/2017 Site/dose:   The patient initially received a dose of 42.5 Gy in 17 fractions to the breast using whole-breast tangent fields. This was delivered using a 3-D conformal technique. The patient then received a boost to the seroma. This delivered an additional 7.5 Gy in 3 fractions using a 3 field photon technique due to the depth of the seroma. The total dose was 50 Gy.  (4) anti-estrogens to follow at the completion of local therapy  PLAN: Patient did not show for he 03/15/2017 appt  Anab Vivar, Virgie Dad, MD  03/08/17 11:53 AM Medical Oncology and Hematology [facility] [place], [place] [address] Tel. [phone]    Fax. [phone]    This document serves as a record of services personally performed by Lurline Del, MD. It was created on his behalf by Steva Colder, a trained medical scribe. The creation of this record is based on the scribe's personal observations and the provider's statements to them.   I have reviewed the above documentation for accuracy and completeness, and I agree with the above.

## 2017-03-15 ENCOUNTER — Encounter: Payer: Self-pay | Admitting: Oncology

## 2017-03-15 ENCOUNTER — Ambulatory Visit (HOSPITAL_BASED_OUTPATIENT_CLINIC_OR_DEPARTMENT_OTHER): Payer: PPO | Admitting: Oncology

## 2017-03-15 DIAGNOSIS — Z170 Estrogen receptor positive status [ER+]: Secondary | ICD-10-CM

## 2017-03-15 DIAGNOSIS — C50412 Malignant neoplasm of upper-outer quadrant of left female breast: Secondary | ICD-10-CM

## 2017-03-16 ENCOUNTER — Inpatient Hospital Stay: Payer: PPO | Attending: Oncology | Admitting: Oncology

## 2017-03-16 ENCOUNTER — Telehealth: Payer: Self-pay | Admitting: Oncology

## 2017-03-16 VITALS — BP 145/69 | HR 97 | Temp 98.7°F | Resp 20 | Ht 62.0 in | Wt 182.7 lb

## 2017-03-16 DIAGNOSIS — Z170 Estrogen receptor positive status [ER+]: Secondary | ICD-10-CM | POA: Insufficient documentation

## 2017-03-16 DIAGNOSIS — C50412 Malignant neoplasm of upper-outer quadrant of left female breast: Secondary | ICD-10-CM | POA: Diagnosis not present

## 2017-03-16 MED ORDER — ANASTROZOLE 1 MG PO TABS: 1.0000 mg | ORAL_TABLET | Freq: Every day | ORAL | 4 refills | Status: DC

## 2017-03-16 NOTE — Telephone Encounter (Signed)
Gave avs and calendar ° °

## 2017-03-16 NOTE — Telephone Encounter (Signed)
Added per MD

## 2017-03-16 NOTE — Progress Notes (Signed)
[facility]  Telephone:(336) [phone] Fax:(336) [phone]   NB: Patient did not show for he 03/15/2017 appt  ID: [name] DOB: 1940/12/14  MR#: [id number]  WGN#:[id number]  Patient Care Team: Lanice Shirts, MD as PCP - General (Internal Medicine) Magrinat, Virgie Dad, MD as Consulting Physician (Oncology) Roseanne Kaufman, MD as Consulting Physician (Orthopedic Surgery) Juanita Craver, MD as Consulting Physician (Gastroenterology) Jari Pigg, MD as Consulting Physician (Dermatology) Kathie Rhodes, MD as Consulting Physician (Urology) Kyung Rudd, MD as Consulting Physician (Radiation Oncology) Rolm Bookbinder, MD as Consulting Physician (General Surgery) OTHER MD:  CHIEF COMPLAINT: estrogen receptor positive breast cancer  CURRENT TREATMENT: Anastrozole   HISTORY OF CURRENT ILLNESS:  From the original intake note:  [name] initially palpated a lump to her left breast while watching TV which she brought to medical attention and was evaluated 1 week following by her PCP, Dr. Coralyn Mark. She then had an unilateral diagnostic left mammography with tomography and CAD and left breast ultrasonography at [facility] on 11/17/2016 showing a 1.6 cm mass in the upper left breast suspicous for malignancy. The left axilla was benign.  Accordingly on 11/20/2016, she proceeded to biopsy of the left breast area in question. The pathology from this procedure showed ([id number]): invasive mammary carcinoma. Prognostic indicators: ER: 100% positive; PR: 5% positive; Both with strong staining intensity. Proliferation marker Ki67: 15% ; HER-2: negative.  The patient's subsequent history is as detailed below.  INTERVAL HISTORY: [name] returns today for follow up and treatment of her estrogen receptor positive breast cancer.   Since her last visit to the office, she completed radiation with Dr. Lisbeth Renshaw with treatment dates of: 01/27/2017 - 02/23/2017 to site/dose: The patient  initially received a dose of 42.5 Gy in 17 fractions to the breast using whole-breast tangent fields. This was delivered using a 3-D conformal technique. The patient then received a boost to the seroma. This delivered an additional 7.5 Gy in 3 fractions using a 3 field photon technique due to the depth of the seroma. The total dose was 50 Gy.  She also had left breast lumpectomy ([id number]) completed on 12/11/2016 with results of: 1. Breast, lumpectomy, Left with invasive ductal carcinoma, grade II/III, spanning 1.4 cm. Invasive carcinoma is broadly less then 0.1 cm to the superior margin of specimen 1. Ductal carcinoma in situ, intermediate grade. Ductal carcinoma in situ is focally less than 0.1 cm to the medial margin of specimen 1. Lymphovascular invasion is identified. Lobular neoplasia (atypical lobular hyperplasia). 2. Breast, excision, Left additional deep margin with fibrocystic changes with adenosis and calcifications. There is no evidence of malignancy. 3. Breast, excision, Left additional medial margin with fibrocystic changes with adenosis and calcifications. There is no evidence of malignancy. 4. Breast, excision, Left additional lateral margin with fibrocystic changes with adenosis and calcifications. There is no evidence of malignancy. 5. Breast, excision, Left additional superior margin with fibrocystic changes with adenosis and calcifications. There is no evidence of malignancy. 6. Lymph node, sentinel, biopsy, Left axillary with no evidence of carcinoma in 1 of 1 lymph node (0/1).   ROS: [name] reports that she is doing well. She notes that she started [employer] today. She enjoyed it, and she notes that she is returning tomorrow. She notes that she has a prescription to manager her blood sugar, but she hasn't been taking it because she doesn't like to check her sugars. She notes that she is addicted to sweets and bread, but she is planning on having  a healthier diet in order to lose  weight. She denies unusual headaches, visual changes, nausea, vomiting, or dizziness. There has been no unusual cough, phlegm production, or pleurisy. This been no change in bowel or bladder habits. She denies unexplained fatigue or unexplained weight loss, bleeding, rash, or fever. A detailed review of systems was otherwise stable.   Past Medical History:  Diagnosis Date  . Anxiety   . Arthritis    knees  . Cancer ([facility]) 11/2016   Left breast cancer  . Depression   . Diverticulitis   . Diverticulosis    bleeding  . GERD (gastroesophageal reflux disease)   . Hypertension   . Hyperthyroidism    nodule on thyroid, Radioactive, now hypo  . Hypothyroidism, iatrogenic    After RI now hypo on synthroid   Past Surgical History:  Procedure Laterality Date  . ABDOMINAL HYSTERECTOMY  1977  . BLADDER SURGERY     bladder suspension  . BREAST LUMPECTOMY WITH RADIOACTIVE SEED AND SENTINEL LYMPH NODE BIOPSY Left 12/11/2016   Procedure: BREAST LUMPECTOMY WITH RADIOACTIVE SEED AND SENTINEL LYMPH NODE BIOPSY;  Surgeon: Rolm Bookbinder, MD;  Location: [facility];  Service: General;  Laterality: Left;  . CHOLECYSTECTOMY N/A 03/29/2013   Procedure: LAPAROSCOPIC CHOLECYSTECTOMY WITH INTRAOPERATIVE CHOLANGIOGRAM;  Surgeon: Edward Jolly, MD;  Location: [facility];  Service: General;  Laterality: N/A;  . JOINT REPLACEMENT Right 2009   total knee replacement  . ROTATOR CUFF REPAIR Left   . TOTAL KNEE REVISION  12/17/2010   Procedure: TOTAL KNEE REVISION;  Surgeon: Dione Plover Aluisio;  Location: [facility];  Service: Orthopedics;  Laterality: Right;   Family History  Problem Relation Age of Onset  . Diverticulitis Mother   . Colon cancer Maternal Grandmother   . Heart attack Father   . Breast cancer Daughter   Her father died at age 27 from heart failure. Her mother died at age 67 from Alzheimer's. She has 1 sister and no brothers. Her maternal grandmother had colon cancer and she is unsure  of what age she was diagnosed with colon cancer. Her father was diagnosed with skin cancer in his late 73's, and she notes that he didn't have melanoma. Her daughter had DCIS breast cancer diagnosed at 40.She hasn't had genetic testing completed as of yet. Her oldest daughter had genetic testing which came back normal. She has a granddaughter that is 35 years old.   GYNECOLOGIC HISTORY:  No LMP recorded. Patient has had a hysterectomy.  Menarche: 77 years old Age at first live birth: 77 years old GP: GxP3 LMP: Had a hysterectomy Contraceptive: OCP HRT: Yes, Estrogen and Progesterone combination for over 30 years.   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 She has 3 children from her first marriage: [name] age 46 who is Mudlogger of a Location manager in [place]. [name] is 54 in Sales in [place]. [name] is 36 and is a 5th grade teacher in [place].               ADVANCED DIRECTIVES:  Her oldest daughter, [name] is the patient's [place] and can be reached at [phone].   Social History   Tobacco Use  . Smoking status: Never Smoker  . Smokeless tobacco: Never Used  Substance Use Topics  . Alcohol use: Yes    Comment: rarely    Colonoscopy:  PAP:  Bone density: at [facility] on 12/10/2015 found a T score of -1.6    Current Outpatient Medications on File Prior to Visit  Medication Sig Dispense Refill  . enalapril (VASOTEC) 10 MG tablet Take 10 mg daily by mouth.    . escitalopram (LEXAPRO) 10 MG tablet Take 10 mg daily by mouth.    Marland Kitchen glimepiride (AMARYL) 1 MG tablet Take 1 mg daily with breakfast by mouth.    . hydrochlorothiazide (MICROZIDE) 12.5 MG capsule Take 12.5 mg daily by mouth.    . Levothyroxine Sodium 100 MCG CAPS Take 100 mcg daily by mouth.   1  . omeprazole (PRILOSEC) 40 MG capsule Take 40 mg daily by mouth.    Marland Kitchen OVER THE COUNTER  MEDICATION Take 1 tablet by mouth at bedtime as needed (Zyquill).    . rosuvastatin (CRESTOR) 5 MG tablet Take 1 tablet by mouth as directed. Monday Wednesday friday  0  . traMADol (ULTRAM) 50 MG tablet Take 2 tablets (100 mg total) every 6 (six) hours as needed by mouth. 12 tablet 0   No current facility-administered medications on file prior to visit.    OBJECTIVE: Middle-aged white woman in no acute distress  Vitals:   03/16/17 1531  BP: (!) 145/69  Pulse: 97  Resp: 20  Temp: 98.7 F (37.1 C)  SpO2: 98%    Sclerae unicteric, EOMs intact Oropharynx clear and moist No cervical or supraclavicular adenopathy Lungs no rales or rhonchi Heart regular rate and rhythm Abd soft, nontender, positive bowel sounds MSK no focal spinal tenderness, no upper extremity lymphedema Neuro: nonfocal, well oriented, appropriate affect Breasts: Right breast is unremarkable.  The left breast is status post lumpectomy and radiation.  There is no evidence of local recurrence.  Axillae are benign    STUDIES: Her most recent bone density scan was reviewed with the patient  ELIGIBLE FOR AVAILABLE RESEARCH PROTOCOL: no  ASSESSMENT: 77 y.o. [place] woman s/p left breast upper outer quadrant biopsy 11/20/2016 for a clinical T1c N0, stage IA invasive ductal carcinoma, grade 1, estrogen and progesterone receptor positive, HER-2 not amplified, with an Mib-1 of 15%  (1) s/p left lumpectomy with sentinel lymph node sampling 12/11/2016 for a pT1c pN0 invasive ductal carcinoma, grade 2, with negative margins  (2) oncotype score of 24 predicted a 10-year risk of recurrence outside the breast of 16% if the patient's only systemic therapy was tamoxifen for 5 years.  It also predicted no significant benefit from chemotherapy.  (3) adjuvant radiation 01/27/2017 - 02/23/2017 Site/dose:   The patient initially received a dose of 42.5 Gy in 17 fractions to the breast using whole-breast tangent fields. This was  delivered using a 3-D conformal technique. The patient then received a boost to the seroma. This delivered an additional 7.5 Gy in 3 fractions using a 3 field photon technique due to the depth of the seroma. The total dose was 50 Gy.  (4) anti-estrogens to follow at the completion of local therapy  (a) bone density at [facility] 12/10/2015 found a T score of -1.6  PLAN: [name] did well with local therapy for her breast cancer and she has completed her surgery and radiation without any major complications.  She is now ready to consider systemic therapy.  She understands that there is a possibility of the cancer still being in her body in a microscopic way which we would not be able to find with any kind of scanner blood work.  For that reason she needs  systemic therapy.  We discussed the difference between tamoxifen and anastrozole in detail. She understands that anastrozole and the aromatase inhibitors in general work by blocking estrogen production. Accordingly vaginal dryness, decrease in bone density, and of course hot flashes can result. The aromatase inhibitors can also negatively affect the cholesterol profile, although that is a minor effect. One out of 5 women on aromatase inhibitors we will feel "old and achy". This arthralgia/myalgia syndrome, which resembles fibromyalgia clinically, does resolve with stopping the medications. Accordingly this is not a reason to not try an aromatase inhibitor but it is a frequent reason to stop it (in other words 20% of women will not be able to tolerate these medications).  Tamoxifen on the other hand does not block estrogen production. It does not "take away a woman's estrogen". It blocks the estrogen receptor in breast cells. Like anastrozole, it can also cause hot flashes. As opposed to anastrozole, tamoxifen has many estrogen-like effects. It is technically an estrogen receptor modulator. This means that in some tissues tamoxifen works like estrogen--  for example it helps strengthen the bones. It tends to improve the cholesterol profile. It can cause thickening of the endometrial lining, and even endometrial polyps or rarely cancer of the uterus.(The risk of uterine cancer due to tamoxifen is one additional cancer per thousand women year). It can cause vaginal wetness or stickiness. It can cause blood clots through this estrogen-like effect--the risk of blood clots with tamoxifen is exactly the same as with birth control pills or hormone replacement.  Neither of these agents causes mood changes or weight gain, despite the popular belief that they can have these side effects. We have data from studies comparing either of these drugs with placebo, and in those cases the control group had the same amount of weight gain and depression as the group that took the drug.  After this discussion we decided she will give anastrozole a try.  I will call in the prescription for her.  She will see me in about 3 months.  If she is tolerating it well the plan will be to do this for 5 years.  If she finds it difficult we will find a different antiestrogen for her  I commended her going to Silver sneakers, suggested she do it at least 4 times a week, and encouraged her to continue to cut carbs from her dietstop--unfortunately she is currently not monitoring her blood sugar or treating it in any way.  She will see me again in 3 months.  She knows to call for any problems that may develop before that visit.    Magrinat, Virgie Dad, MD  03/16/17 3:51 PM Medical Oncology and Hematology [facility] [address] [place], [place] [address] Tel. [phone]    Fax. [phone]  This document serves as a record of services personally performed by Lurline Del, MD. It was created on his behalf by Sheron Nightingale, a trained medical scribe. The creation of this record is based on the scribe's personal observations and the provider's statements to them.     I have reviewed the above documentation for accuracy and completeness, and I agree with the above.

## 2017-04-09 DIAGNOSIS — H1189 Other specified disorders of conjunctiva: Secondary | ICD-10-CM | POA: Diagnosis not present

## 2017-04-09 DIAGNOSIS — H01024 Squamous blepharitis left upper eyelid: Secondary | ICD-10-CM | POA: Diagnosis not present

## 2017-04-09 DIAGNOSIS — H01025 Squamous blepharitis left lower eyelid: Secondary | ICD-10-CM | POA: Diagnosis not present

## 2017-04-13 ENCOUNTER — Other Ambulatory Visit: Payer: Self-pay | Admitting: Radiation Oncology

## 2017-04-13 DIAGNOSIS — Z9889 Other specified postprocedural states: Secondary | ICD-10-CM

## 2017-04-27 ENCOUNTER — Ambulatory Visit
Admission: RE | Admit: 2017-04-27 | Discharge: 2017-04-27 | Disposition: A | Discharge status: Home / Self Care | Payer: PPO | Source: Ambulatory Visit | Attending: Radiation Oncology | Admitting: Radiation Oncology

## 2017-04-27 DIAGNOSIS — Z9889 Other specified postprocedural states: Secondary | ICD-10-CM

## 2017-04-27 DIAGNOSIS — R922 Inconclusive mammogram: Secondary | ICD-10-CM | POA: Diagnosis not present

## 2017-04-27 HISTORY — DX: Personal history of irradiation: Z92.3

## 2017-05-03 DIAGNOSIS — Z853 Personal history of malignant neoplasm of breast: Secondary | ICD-10-CM | POA: Diagnosis not present

## 2017-05-03 DIAGNOSIS — N183 Chronic kidney disease, stage 3 (moderate): Secondary | ICD-10-CM | POA: Diagnosis not present

## 2017-05-03 DIAGNOSIS — M1990 Unspecified osteoarthritis, unspecified site: Secondary | ICD-10-CM | POA: Diagnosis not present

## 2017-05-03 DIAGNOSIS — I10 Essential (primary) hypertension: Secondary | ICD-10-CM | POA: Diagnosis not present

## 2017-05-03 DIAGNOSIS — Z7984 Long term (current) use of oral hypoglycemic drugs: Secondary | ICD-10-CM | POA: Diagnosis not present

## 2017-05-03 DIAGNOSIS — E039 Hypothyroidism, unspecified: Secondary | ICD-10-CM | POA: Diagnosis not present

## 2017-05-03 DIAGNOSIS — E1165 Type 2 diabetes mellitus with hyperglycemia: Secondary | ICD-10-CM | POA: Diagnosis not present

## 2017-05-03 DIAGNOSIS — F331 Major depressive disorder, recurrent, moderate: Secondary | ICD-10-CM | POA: Diagnosis not present

## 2017-05-12 DIAGNOSIS — I10 Essential (primary) hypertension: Secondary | ICD-10-CM | POA: Diagnosis not present

## 2017-05-12 DIAGNOSIS — E790 Hyperuricemia without signs of inflammatory arthritis and tophaceous disease: Secondary | ICD-10-CM | POA: Diagnosis not present

## 2017-05-12 DIAGNOSIS — E119 Type 2 diabetes mellitus without complications: Secondary | ICD-10-CM | POA: Diagnosis not present

## 2017-05-12 DIAGNOSIS — R05 Cough: Secondary | ICD-10-CM | POA: Diagnosis not present

## 2017-05-25 DIAGNOSIS — E1165 Type 2 diabetes mellitus with hyperglycemia: Secondary | ICD-10-CM | POA: Diagnosis not present

## 2017-05-25 DIAGNOSIS — R195 Other fecal abnormalities: Secondary | ICD-10-CM | POA: Diagnosis not present

## 2017-05-25 DIAGNOSIS — Z853 Personal history of malignant neoplasm of breast: Secondary | ICD-10-CM | POA: Diagnosis not present

## 2017-05-26 DIAGNOSIS — L820 Inflamed seborrheic keratosis: Secondary | ICD-10-CM | POA: Diagnosis not present

## 2017-05-26 DIAGNOSIS — D485 Neoplasm of uncertain behavior of skin: Secondary | ICD-10-CM | POA: Diagnosis not present

## 2017-05-26 DIAGNOSIS — D2272 Melanocytic nevi of left lower limb, including hip: Secondary | ICD-10-CM | POA: Diagnosis not present

## 2017-05-26 DIAGNOSIS — B078 Other viral warts: Secondary | ICD-10-CM | POA: Diagnosis not present

## 2017-05-26 DIAGNOSIS — Z85828 Personal history of other malignant neoplasm of skin: Secondary | ICD-10-CM | POA: Diagnosis not present

## 2017-05-26 DIAGNOSIS — Z808 Family history of malignant neoplasm of other organs or systems: Secondary | ICD-10-CM | POA: Diagnosis not present

## 2017-06-09 DIAGNOSIS — L905 Scar conditions and fibrosis of skin: Secondary | ICD-10-CM | POA: Diagnosis not present

## 2017-06-09 DIAGNOSIS — Z85828 Personal history of other malignant neoplasm of skin: Secondary | ICD-10-CM | POA: Diagnosis not present

## 2017-06-09 DIAGNOSIS — R300 Dysuria: Secondary | ICD-10-CM | POA: Diagnosis not present

## 2017-06-11 NOTE — Progress Notes (Signed)
[facility]  [phone] [phone]   NB: Patient did not show for he 03/15/2017 appt  ID: [name] DOB: 1940-04-10  MR#: [id number]  FFM#:[id number]  Patient Care Team: Lanice Shirts, MD as PCP - General (Internal Medicine) Magrinat, Virgie Dad, MD as Consulting Physician (Oncology) Roseanne Kaufman, MD as Consulting Physician (Orthopedic Surgery) Juanita Craver, MD as Consulting Physician (Gastroenterology) Jari Pigg, MD as Consulting Physician (Dermatology) Kathie Rhodes, MD as Consulting Physician (Urology) Kyung Rudd, MD as Consulting Physician (Radiation Oncology) Rolm Bookbinder, MD as Consulting Physician (General Surgery) OTHER MD:  CHIEF COMPLAINT: estrogen receptor positive breast cancer  CURRENT TREATMENT: Anastrozole   HISTORY OF CURRENT ILLNESS:  From the original intake note:  [name] initially palpated a lump to her left breast while watching TV which she brought to medical attention and was evaluated 1 week following by her PCP, Dr. Coralyn Mark. She then had an unilateral diagnostic left mammography with tomography and CAD and left breast ultrasonography at [facility] on 11/17/2016 showing a 1.6 cm mass in the upper left breast suspicous for malignancy. The left axilla was benign.  Accordingly on 11/20/2016, she proceeded to biopsy of the left breast area in question. The pathology from this procedure showed ([id number]): invasive mammary carcinoma. Prognostic indicators: ER: 100% positive; PR: 5% positive; Both with strong staining intensity. Proliferation marker Ki67: 15% ; HER-2: negative.  The patient's subsequent history is as detailed below.  INTERVAL HISTORY: [name] returns today for follow up and treatment of her estrogen receptor positive breast cancer. She continues on anastrozole, with good tolerance. She has regular hot flashes, but they are not intense. She fans herself or turns on a fan to cool down.  She denies issues with vaginal dryness. She obtains a 3 month supply for a low cost.   Since her last visit, she underwent diagnostic bilateral mammography with CAD and tomography on 04/27/2017 at [facility] showing: breast density category C. There was no evidence of new or reccurrent breast carcinoma.    REVIEW OF SYSTEMS: [name] reports that she had a right knee replacement. She has an appointment with Dr. Meribeth Mattes to have injections to aid the pain. She is planning on going to [employer] soon, so she hopes that her knee problems will improve by then. For exercise, she attends [employer] 2 days per week. She denies unusual headaches, visual changes, nausea, vomiting, or dizziness. There has been no unusual cough, phlegm production, or pleurisy. This been no change in bowel or bladder habits. She denies unexplained fatigue or unexplained weight loss, bleeding, rash, or fever. A detailed review of systems was otherwise stable.    Past Medical History:  Diagnosis Date  . Anxiety   . Arthritis    knees  . Cancer ([facility]) 11/2016   Left breast cancer  . Depression   . Diverticulitis   . Diverticulosis    bleeding  . GERD (gastroesophageal reflux disease)   . Hypertension   . Hyperthyroidism    nodule on thyroid, Radioactive, now hypo  . Hypothyroidism, iatrogenic    After RI now hypo on synthroid  . Personal history of radiation therapy    Past Surgical History:  Procedure Laterality Date  . ABDOMINAL HYSTERECTOMY  1977  . BLADDER SURGERY     bladder suspension  . BREAST EXCISIONAL BIOPSY Right   . BREAST LUMPECTOMY Left   . BREAST LUMPECTOMY WITH RADIOACTIVE SEED AND SENTINEL LYMPH NODE BIOPSY Left 12/11/2016   Procedure:  BREAST LUMPECTOMY WITH RADIOACTIVE SEED AND SENTINEL LYMPH NODE BIOPSY;  Surgeon: Rolm Bookbinder, MD;  Location: [facility];  Service: General;  Laterality: Left;  . CHOLECYSTECTOMY N/A 03/29/2013   Procedure: LAPAROSCOPIC CHOLECYSTECTOMY  WITH INTRAOPERATIVE CHOLANGIOGRAM;  Surgeon: Edward Jolly, MD;  Location: [facility];  Service: General;  Laterality: N/A;  . JOINT REPLACEMENT Right 2009   total knee replacement  . ROTATOR CUFF REPAIR Left   . TOTAL KNEE REVISION  12/17/2010   Procedure: TOTAL KNEE REVISION;  Surgeon: Dione Plover Aluisio;  Location: [facility];  Service: Orthopedics;  Laterality: Right;   Family History  Problem Relation Age of Onset  . Diverticulitis Mother   . Colon cancer Maternal Grandmother   . Heart attack Father   . Breast cancer Daughter   Her father died at age 89 from heart failure. Her mother died at age 56 from Alzheimer's. She has 1 sister and no brothers. Her maternal grandmother had colon cancer and she is unsure of what age she was diagnosed with colon cancer. Her father was diagnosed with skin cancer in his late 65's, and she notes that he didn't have melanoma. Her daughter had DCIS breast cancer diagnosed at 32.She hasn't had genetic testing completed as of yet. Her oldest daughter had genetic testing which came back normal. She has a granddaughter that is 74 years old.   GYNECOLOGIC HISTORY:  No LMP recorded. Patient has had a hysterectomy.  Menarche: 77 years old Age at first live birth: 77 years old GP: GxP3 LMP: Had a hysterectomy Contraceptive: OCP HRT: Yes, Estrogen and Progesterone combination for over 30 years.   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 She has 3 children from her first marriage: [name] age 62 who is Mudlogger of a Location manager in [place]. [name] is 37 in Sales in [place]. 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               ADVANCED DIRECTIVES:  Her oldest daughter, [name] is the patient's [place] and can be reached at [phone].   Social History   Tobacco Use  . Smoking status: Never Smoker  . Smokeless tobacco:  Never Used  Substance Use Topics  . Alcohol use: Yes    Comment: rarely    Colonoscopy:  PAP:  Bone density: at [facility] on 12/10/2015 found a T score of -1.6    Current Outpatient Medications on File Prior to Visit  Medication Sig Dispense Refill  . anastrozole (ARIMIDEX) 1 MG tablet Take 1 tablet (1 mg total) by mouth daily. 90 tablet 4  . enalapril (VASOTEC) 10 MG tablet Take 10 mg daily by mouth.    . escitalopram (LEXAPRO) 10 MG tablet Take 10 mg daily by mouth.    . hydrochlorothiazide (MICROZIDE) 12.5 MG capsule Take 12.5 mg daily by mouth.    . Levothyroxine Sodium 100 MCG CAPS Take 100 mcg daily by mouth.   1  . omeprazole (PRILOSEC) 40 MG capsule Take 40 mg daily by mouth.    Marland Kitchen OVER THE COUNTER MEDICATION Take 1 tablet by mouth at bedtime as needed (Zyquill).    . rosuvastatin (CRESTOR) 5 MG tablet Take 1 tablet by mouth as directed. Monday Wednesday friday  0   No current facility-administered medications on file prior to visit.    OBJECTIVE: Middle-aged white  woman who appears stated age  29:   06/14/17 1119  BP: (!) 142/77  Pulse: 86  Resp: 18  Temp: 98.7 F (37.1 C)  SpO2: 99%    Filed Weights   06/14/17 1119  Weight: 172 lb 1.6 oz (78.1 kg)  Body mass index is 31.48 kg/m.   Sclerae unicteric, pupils round and equal Oropharynx clear and moist No cervical or supraclavicular adenopathy Lungs no rales or rhonchi Heart regular rate and rhythm Abd soft, obese, nontender, positive bowel sounds MSK no focal spinal tenderness, no upper extremity lymphedema Neuro: nonfocal, well oriented, appropriate affect Breasts: The right breast is benign.  The left breast has undergone lumpectomy and radiation.  The cosmetic result is good.  There is minimal duskiness remaining, but no palpable mass and no erythema.  Both axillae are benign  STUDIES: Since her last visit, she underwent diagnostic bilateral mammography with CAD and tomography on 04/27/2017 at  [facility] showing: breast density category C. There was no evidence of new or reccurrent breast carcinoma.   ELIGIBLE FOR AVAILABLE RESEARCH PROTOCOL: no  ASSESSMENT: 77 y.o. [place] woman s/p left breast upper outer quadrant biopsy 11/20/2016 for a clinical T1c N0, stage IA invasive ductal carcinoma, grade 1, estrogen and progesterone receptor positive, HER-2 not amplified, with an Mib-1 of 15%  (1) s/p left lumpectomy with sentinel lymph node sampling 12/11/2016 for a pT1c pN0 invasive ductal carcinoma, grade 2, with negative margins  (2) oncotype score of 24 predicted a 10-year risk of recurrence outside the breast of 16% if the patient's only systemic therapy was tamoxifen for 5 years.  It also predicted no significant benefit from chemotherapy.  (3) adjuvant radiation 01/27/2017 - 02/23/2017 Site/dose:   The patient initially received a dose of 42.5 Gy in 17 fractions to the breast using whole-breast tangent fields. This was delivered using a 3-D conformal technique. The patient then received a boost to the seroma. This delivered an additional 7.5 Gy in 3 fractions using a 3 field photon technique due to the depth of the seroma. The total dose was 50 Gy.  (4) anastrozole started 03/16/2017  (a) bone density at [facility] 12/10/2015 found a T score of -1.6  PLAN: [name] is tolerating anastrozole well and the plan will be to continue that a total of 5 years.  We have a vitamin D level pending but she has recently increased her dose to 5000 units a day, which I think is a good idea in her case.  She will be due for repeat bone density November of this year.  I will see her shortly after that to discuss results.  Otherwise I am delighted on how well she is doing.  She will call with any issues that may develop before next visit.   Magrinat, Virgie Dad, MD  06/14/17 11:40 AM Medical Oncology and Hematology [facility] [address] [place], [place]  [address] Tel. [phone]    Fax. [phone]  This document serves as a record of services personally performed by Lurline Del, MD. It was created on his behalf by Sheron Nightingale, a trained medical scribe. The creation of this record is based on the scribe's personal observations and the provider's statements to them.   I have reviewed the above documentation for accuracy and completeness, and I agree with the above.

## 2017-06-14 ENCOUNTER — Inpatient Hospital Stay: Payer: PPO

## 2017-06-14 ENCOUNTER — Inpatient Hospital Stay: Payer: PPO | Attending: Oncology | Admitting: Oncology

## 2017-06-14 VITALS — BP 142/77 | HR 86 | Temp 98.7°F | Resp 18 | Ht 62.0 in | Wt 172.1 lb

## 2017-06-14 DIAGNOSIS — Z79811 Long term (current) use of aromatase inhibitors: Secondary | ICD-10-CM | POA: Diagnosis not present

## 2017-06-14 DIAGNOSIS — Z170 Estrogen receptor positive status [ER+]: Principal | ICD-10-CM

## 2017-06-14 DIAGNOSIS — C50412 Malignant neoplasm of upper-outer quadrant of left female breast: Secondary | ICD-10-CM | POA: Insufficient documentation

## 2017-06-14 LAB — CBC WITH DIFFERENTIAL/PLATELET
BASOS PCT: 1 %
Basophils Absolute: 0.1 10*3/uL (ref 0.0–0.1)
EOS ABS: 0.2 10*3/uL (ref 0.0–0.5)
Eosinophils Relative: 3 %
HEMATOCRIT: 38.2 % (ref 34.8–46.6)
Hemoglobin: 12.6 g/dL (ref 11.6–15.9)
LYMPHS ABS: 1.4 10*3/uL (ref 0.9–3.3)
LYMPHS PCT: 27 %
MCH: 28.9 pg (ref 25.1–34.0)
MCHC: 33.1 g/dL (ref 31.5–36.0)
MCV: 87.4 fL (ref 79.5–101.0)
MONO ABS: 0.4 10*3/uL (ref 0.1–0.9)
MONOS PCT: 8 %
NEUTROS ABS: 3.3 10*3/uL (ref 1.5–6.5)
Neutrophils Relative %: 61 %
Platelets: 250 10*3/uL (ref 145–400)
RBC: 4.37 MIL/uL (ref 3.70–5.45)
RDW: 14.1 % (ref 11.2–14.5)
WBC: 5.3 10*3/uL (ref 3.9–10.3)

## 2017-06-14 LAB — COMPREHENSIVE METABOLIC PANEL
ALBUMIN: 3.9 g/dL (ref 3.5–5.0)
ALT: 16 U/L (ref 0–55)
ANION GAP: 10 (ref 3–11)
AST: 15 U/L (ref 5–34)
Alkaline Phosphatase: 87 U/L (ref 40–150)
BILIRUBIN TOTAL: 0.5 mg/dL (ref 0.2–1.2)
BUN: 24 mg/dL (ref 7–26)
CALCIUM: 9.9 mg/dL (ref 8.4–10.4)
CO2: 24 mmol/L (ref 22–29)
Chloride: 102 mmol/L (ref 98–109)
Creatinine, Ser: 1.5 mg/dL — ABNORMAL HIGH (ref 0.60–1.10)
GFR calc non Af Amer: 33 mL/min — ABNORMAL LOW (ref >60)
GFR, EST AFRICAN AMERICAN: 38 mL/min — AB (ref >60)
GLUCOSE: 128 mg/dL (ref 70–140)
POTASSIUM: 4 mmol/L (ref 3.5–5.1)
SODIUM: 136 mmol/L (ref 136–145)
TOTAL PROTEIN: 7.2 g/dL (ref 6.4–8.3)

## 2017-06-14 MED ORDER — ANASTROZOLE 1 MG PO TABS: 1.0000 mg | ORAL_TABLET | Freq: Every day | ORAL | 4 refills | Status: DC

## 2017-06-15 ENCOUNTER — Encounter: Payer: Self-pay | Admitting: Dietician

## 2017-06-15 ENCOUNTER — Encounter: Payer: PPO | Attending: Internal Medicine | Admitting: Dietician

## 2017-06-15 DIAGNOSIS — Z713 Dietary counseling and surveillance: Secondary | ICD-10-CM | POA: Insufficient documentation

## 2017-06-15 DIAGNOSIS — E119 Type 2 diabetes mellitus without complications: Secondary | ICD-10-CM

## 2017-06-15 DIAGNOSIS — E1165 Type 2 diabetes mellitus with hyperglycemia: Secondary | ICD-10-CM | POA: Insufficient documentation

## 2017-06-15 DIAGNOSIS — Z6831 Body mass index (BMI) 31.0-31.9, adult: Secondary | ICD-10-CM | POA: Diagnosis not present

## 2017-06-15 LAB — VITAMIN D 25 HYDROXY (VIT D DEFICIENCY, FRACTURES): VIT D 25 HYDROXY: 41.6 ng/mL (ref 30.0–100.0)

## 2017-06-15 NOTE — Progress Notes (Signed)
Patient was seen on 06/15/17 for the first of a series of three diabetes self-management courses at [facility].  Patient Education Plan per assessed needs and concerns is to attend three course education program for Diabetes Self Management Education.  The following learning objectives were met by the patient during this class:  Describe diabetes  State some common risk factors for diabetes  Defines the role of glucose and insulin  Identifies type of diabetes and pathophysiology  Describe the relationship between diabetes and cardiovascular risk  State the members of the Healthcare Team  States the rationale for glucose monitoring  State when to test glucose  State their individual Target Range  State the importance of logging glucose readings  Describe how to interpret glucose readings  Identifies A1C target  Explain the correlation between A1c and eAG values  State symptoms and treatment of high blood glucose  State symptoms and treatment of low blood glucose  Explain proper technique for glucose testing  Identifies proper sharps disposal  Handouts given during class include:  ADA Diabetes You Take Control   Carb Counting and Meal Planning book  Meal Plan Card  Meal planning worksheet  Low Sodium Flavoring Tips  Types of Fats  The diabetes portion plate  M8U to eAG Conversion Chart  Diabetes Recommended Care Schedule  Support Group  Diabetes Success Plan  Core Class Satisfaction Survey   Follow-Up Plan:  Attend core 2

## 2017-06-17 DIAGNOSIS — E1165 Type 2 diabetes mellitus with hyperglycemia: Secondary | ICD-10-CM | POA: Diagnosis not present

## 2017-06-17 DIAGNOSIS — N200 Calculus of kidney: Secondary | ICD-10-CM | POA: Diagnosis not present

## 2017-06-17 DIAGNOSIS — R3129 Other microscopic hematuria: Secondary | ICD-10-CM | POA: Diagnosis not present

## 2017-06-17 DIAGNOSIS — R634 Abnormal weight loss: Secondary | ICD-10-CM | POA: Diagnosis not present

## 2017-06-22 ENCOUNTER — Encounter: Payer: Self-pay | Admitting: Dietician

## 2017-06-22 ENCOUNTER — Encounter: Payer: PPO | Admitting: Dietician

## 2017-06-22 DIAGNOSIS — E119 Type 2 diabetes mellitus without complications: Secondary | ICD-10-CM

## 2017-06-22 DIAGNOSIS — Z713 Dietary counseling and surveillance: Secondary | ICD-10-CM | POA: Diagnosis not present

## 2017-06-22 NOTE — Progress Notes (Signed)
Patient was seen on 06/22/17 for the second of a series of three diabetes self-management courses at the [facility]. The following learning objectives were met by the patient during this class:   Describe the role of different macronutrients on glucose  Explain how carbohydrates affect blood glucose  State what foods contain the most carbohydrates  Demonstrate carbohydrate counting  Demonstrate how to read Nutrition Facts food label  Describe effects of various fats on heart health  Describe the importance of good nutrition for health and healthy eating strategies  Describe techniques for managing your shopping, cooking and meal planning  List strategies to follow meal plan when dining out  Describe the effects of alcohol on glucose and how to use it safely  Goals:  Follow Diabetes Meal Plan as instructed  Aim to spread carbs evenly throughout the day  Aim for 3 meals per day and snacks as needed Include lean protein foods to meals/snacks  Monitor glucose levels as instructed by your doctor   Follow-Up Plan:  Attend Core 3  Work towards following your personal food plan.   

## 2017-06-25 DIAGNOSIS — M1712 Unilateral primary osteoarthritis, left knee: Secondary | ICD-10-CM | POA: Diagnosis not present

## 2017-06-25 DIAGNOSIS — M25562 Pain in left knee: Secondary | ICD-10-CM | POA: Diagnosis not present

## 2017-06-25 DIAGNOSIS — M25561 Pain in right knee: Secondary | ICD-10-CM | POA: Diagnosis not present

## 2017-06-25 DIAGNOSIS — M170 Bilateral primary osteoarthritis of knee: Secondary | ICD-10-CM | POA: Diagnosis not present

## 2017-06-28 DIAGNOSIS — R3129 Other microscopic hematuria: Secondary | ICD-10-CM | POA: Diagnosis not present

## 2017-06-28 DIAGNOSIS — R8279 Other abnormal findings on microbiological examination of urine: Secondary | ICD-10-CM | POA: Diagnosis not present

## 2017-06-29 ENCOUNTER — Encounter: Payer: PPO | Attending: Internal Medicine | Admitting: Dietician

## 2017-06-29 DIAGNOSIS — E119 Type 2 diabetes mellitus without complications: Secondary | ICD-10-CM

## 2017-06-29 DIAGNOSIS — E1165 Type 2 diabetes mellitus with hyperglycemia: Secondary | ICD-10-CM | POA: Insufficient documentation

## 2017-06-29 DIAGNOSIS — Z713 Dietary counseling and surveillance: Secondary | ICD-10-CM | POA: Diagnosis not present

## 2017-06-29 DIAGNOSIS — Z6831 Body mass index (BMI) 31.0-31.9, adult: Secondary | ICD-10-CM | POA: Diagnosis not present

## 2017-06-29 NOTE — Progress Notes (Signed)
Patient was seen on 06/29/17 for the third of a series of three diabetes self-management courses at the [facility].   [name] the amount of activity recommended for healthy living . Describe activities suitable for individual needs . Identify ways to regularly incorporate activity into daily life . Identify barriers to activity and ways to over come these barriers  Identify diabetes medications being personally used and their primary action for lowering glucose and possible side effects . Describe role of stress on blood glucose and develop strategies to address psychosocial issues . Identify diabetes complications and ways to prevent them  Explain how to manage diabetes during illness . Evaluate success in meeting personal goal . Establish 2-3 goals that they will plan to diligently work on  Goals:   I will count my carb choices at most meals and snacks  I will eat less unhealthy fats   Your patient has identified these potential barriers to change:  Motivation  Your patient has identified their diabetes self-care support plan as  Family Support    Plan:  Attend Support Group as desired

## 2017-07-12 DIAGNOSIS — M25562 Pain in left knee: Secondary | ICD-10-CM | POA: Diagnosis not present

## 2017-07-12 DIAGNOSIS — M25561 Pain in right knee: Secondary | ICD-10-CM | POA: Diagnosis not present

## 2017-07-15 DIAGNOSIS — M25561 Pain in right knee: Secondary | ICD-10-CM | POA: Diagnosis not present

## 2017-07-15 DIAGNOSIS — M25562 Pain in left knee: Secondary | ICD-10-CM | POA: Diagnosis not present

## 2017-07-19 DIAGNOSIS — M25562 Pain in left knee: Secondary | ICD-10-CM | POA: Diagnosis not present

## 2017-07-19 DIAGNOSIS — M25561 Pain in right knee: Secondary | ICD-10-CM | POA: Diagnosis not present

## 2017-07-20 DIAGNOSIS — E119 Type 2 diabetes mellitus without complications: Secondary | ICD-10-CM | POA: Diagnosis not present

## 2017-07-20 DIAGNOSIS — H2513 Age-related nuclear cataract, bilateral: Secondary | ICD-10-CM | POA: Diagnosis not present

## 2017-07-22 DIAGNOSIS — M25562 Pain in left knee: Secondary | ICD-10-CM | POA: Diagnosis not present

## 2017-07-22 DIAGNOSIS — M25561 Pain in right knee: Secondary | ICD-10-CM | POA: Diagnosis not present

## 2017-07-23 DIAGNOSIS — M5136 Other intervertebral disc degeneration, lumbar region: Secondary | ICD-10-CM | POA: Diagnosis not present

## 2017-07-26 DIAGNOSIS — M25562 Pain in left knee: Secondary | ICD-10-CM | POA: Diagnosis not present

## 2017-07-26 DIAGNOSIS — M25561 Pain in right knee: Secondary | ICD-10-CM | POA: Diagnosis not present

## 2017-07-28 DIAGNOSIS — M25562 Pain in left knee: Secondary | ICD-10-CM | POA: Diagnosis not present

## 2017-07-28 DIAGNOSIS — M25561 Pain in right knee: Secondary | ICD-10-CM | POA: Diagnosis not present

## 2017-07-30 DIAGNOSIS — M1712 Unilateral primary osteoarthritis, left knee: Secondary | ICD-10-CM | POA: Diagnosis not present

## 2017-07-30 DIAGNOSIS — M25562 Pain in left knee: Secondary | ICD-10-CM | POA: Diagnosis not present

## 2017-08-02 DIAGNOSIS — M25562 Pain in left knee: Secondary | ICD-10-CM | POA: Diagnosis not present

## 2017-08-02 DIAGNOSIS — M25561 Pain in right knee: Secondary | ICD-10-CM | POA: Diagnosis not present

## 2017-08-06 DIAGNOSIS — M1712 Unilateral primary osteoarthritis, left knee: Secondary | ICD-10-CM | POA: Diagnosis not present

## 2017-08-09 ENCOUNTER — Other Ambulatory Visit: Payer: Self-pay | Admitting: Physician Assistant

## 2017-08-09 DIAGNOSIS — R3129 Other microscopic hematuria: Secondary | ICD-10-CM | POA: Diagnosis not present

## 2017-08-09 DIAGNOSIS — M5136 Other intervertebral disc degeneration, lumbar region: Secondary | ICD-10-CM

## 2017-08-12 DIAGNOSIS — M1712 Unilateral primary osteoarthritis, left knee: Secondary | ICD-10-CM | POA: Diagnosis not present

## 2017-08-12 DIAGNOSIS — C50412 Malignant neoplasm of upper-outer quadrant of left female breast: Secondary | ICD-10-CM | POA: Diagnosis not present

## 2017-08-16 ENCOUNTER — Ambulatory Visit
Admission: RE | Admit: 2017-08-16 | Discharge: 2017-08-16 | Disposition: A | Discharge status: Home / Self Care | Payer: PPO | Source: Ambulatory Visit | Attending: Physician Assistant | Admitting: Physician Assistant

## 2017-08-16 DIAGNOSIS — M47817 Spondylosis without myelopathy or radiculopathy, lumbosacral region: Secondary | ICD-10-CM | POA: Diagnosis not present

## 2017-08-16 DIAGNOSIS — M5136 Other intervertebral disc degeneration, lumbar region: Secondary | ICD-10-CM

## 2017-08-16 MED ORDER — METHYLPREDNISOLONE ACETATE 40 MG/ML INJ SUSP (RADIOLOG
120.0000 mg | Freq: Once | INTRAMUSCULAR | Status: AC
  Administered 2017-08-16: 120 mg via EPIDURAL

## 2017-08-16 MED ORDER — IOPAMIDOL (ISOVUE-M 200) INJECTION 41%
1.0000 mL | Freq: Once | INTRAMUSCULAR | Status: AC
  Administered 2017-08-16: 1 mL via EPIDURAL

## 2017-08-16 NOTE — Discharge Instructions (Signed)

## 2017-09-20 DIAGNOSIS — Z853 Personal history of malignant neoplasm of breast: Secondary | ICD-10-CM | POA: Diagnosis not present

## 2017-09-20 DIAGNOSIS — N183 Chronic kidney disease, stage 3 (moderate): Secondary | ICD-10-CM | POA: Diagnosis not present

## 2017-09-20 DIAGNOSIS — F331 Major depressive disorder, recurrent, moderate: Secondary | ICD-10-CM | POA: Diagnosis not present

## 2017-09-20 DIAGNOSIS — E039 Hypothyroidism, unspecified: Secondary | ICD-10-CM | POA: Diagnosis not present

## 2017-09-20 DIAGNOSIS — M1990 Unspecified osteoarthritis, unspecified site: Secondary | ICD-10-CM | POA: Diagnosis not present

## 2017-09-20 DIAGNOSIS — E119 Type 2 diabetes mellitus without complications: Secondary | ICD-10-CM | POA: Diagnosis not present

## 2017-09-20 DIAGNOSIS — I10 Essential (primary) hypertension: Secondary | ICD-10-CM | POA: Diagnosis not present

## 2017-09-21 DIAGNOSIS — N200 Calculus of kidney: Secondary | ICD-10-CM | POA: Diagnosis not present

## 2017-09-21 DIAGNOSIS — Z20828 Contact with and (suspected) exposure to other viral communicable diseases: Secondary | ICD-10-CM | POA: Diagnosis not present

## 2017-09-21 DIAGNOSIS — E119 Type 2 diabetes mellitus without complications: Secondary | ICD-10-CM | POA: Diagnosis not present

## 2017-10-07 DIAGNOSIS — M25562 Pain in left knee: Secondary | ICD-10-CM | POA: Diagnosis not present

## 2017-10-07 DIAGNOSIS — M25561 Pain in right knee: Secondary | ICD-10-CM | POA: Diagnosis not present

## 2017-10-07 DIAGNOSIS — M170 Bilateral primary osteoarthritis of knee: Secondary | ICD-10-CM | POA: Diagnosis not present

## 2017-10-11 DIAGNOSIS — N183 Chronic kidney disease, stage 3 (moderate): Secondary | ICD-10-CM | POA: Diagnosis not present

## 2017-10-11 DIAGNOSIS — E039 Hypothyroidism, unspecified: Secondary | ICD-10-CM | POA: Diagnosis not present

## 2017-10-11 DIAGNOSIS — E1165 Type 2 diabetes mellitus with hyperglycemia: Secondary | ICD-10-CM | POA: Diagnosis not present

## 2017-10-11 DIAGNOSIS — Z853 Personal history of malignant neoplasm of breast: Secondary | ICD-10-CM | POA: Diagnosis not present

## 2017-10-11 DIAGNOSIS — I10 Essential (primary) hypertension: Secondary | ICD-10-CM | POA: Diagnosis not present

## 2017-10-11 DIAGNOSIS — M1990 Unspecified osteoarthritis, unspecified site: Secondary | ICD-10-CM | POA: Diagnosis not present

## 2017-10-11 DIAGNOSIS — F331 Major depressive disorder, recurrent, moderate: Secondary | ICD-10-CM | POA: Diagnosis not present

## 2017-10-25 ENCOUNTER — Other Ambulatory Visit: Payer: Self-pay | Admitting: Physician Assistant

## 2017-10-25 DIAGNOSIS — M5136 Other intervertebral disc degeneration, lumbar region: Secondary | ICD-10-CM

## 2017-10-29 DIAGNOSIS — E119 Type 2 diabetes mellitus without complications: Secondary | ICD-10-CM | POA: Diagnosis not present

## 2017-10-29 DIAGNOSIS — E039 Hypothyroidism, unspecified: Secondary | ICD-10-CM | POA: Diagnosis not present

## 2017-10-29 DIAGNOSIS — M1990 Unspecified osteoarthritis, unspecified site: Secondary | ICD-10-CM | POA: Diagnosis not present

## 2017-10-29 DIAGNOSIS — F331 Major depressive disorder, recurrent, moderate: Secondary | ICD-10-CM | POA: Diagnosis not present

## 2017-10-29 DIAGNOSIS — Z853 Personal history of malignant neoplasm of breast: Secondary | ICD-10-CM | POA: Diagnosis not present

## 2017-10-29 DIAGNOSIS — I10 Essential (primary) hypertension: Secondary | ICD-10-CM | POA: Diagnosis not present

## 2017-10-29 DIAGNOSIS — N183 Chronic kidney disease, stage 3 (moderate): Secondary | ICD-10-CM | POA: Diagnosis not present

## 2017-10-29 DIAGNOSIS — E1165 Type 2 diabetes mellitus with hyperglycemia: Secondary | ICD-10-CM | POA: Diagnosis not present

## 2017-11-05 ENCOUNTER — Ambulatory Visit
Admission: RE | Admit: 2017-11-05 | Discharge: 2017-11-05 | Disposition: A | Discharge status: Home / Self Care | Payer: PPO | Source: Ambulatory Visit | Attending: Physician Assistant | Admitting: Physician Assistant

## 2017-11-05 ENCOUNTER — Other Ambulatory Visit: Payer: Self-pay | Admitting: Physician Assistant

## 2017-11-05 DIAGNOSIS — M5136 Other intervertebral disc degeneration, lumbar region: Secondary | ICD-10-CM

## 2017-11-05 DIAGNOSIS — M5416 Radiculopathy, lumbar region: Secondary | ICD-10-CM | POA: Diagnosis not present

## 2017-11-05 MED ORDER — METHYLPREDNISOLONE ACETATE 40 MG/ML INJ SUSP (RADIOLOG
120.0000 mg | Freq: Once | INTRAMUSCULAR | Status: AC
  Administered 2017-11-05: 120 mg via EPIDURAL

## 2017-11-05 MED ORDER — IOPAMIDOL (ISOVUE-M 200) INJECTION 41%
1.0000 mL | Freq: Once | INTRAMUSCULAR | Status: AC
  Administered 2017-11-05: 1 mL via EPIDURAL

## 2017-11-15 ENCOUNTER — Encounter (HOSPITAL_BASED_OUTPATIENT_CLINIC_OR_DEPARTMENT_OTHER): Payer: Self-pay | Admitting: *Deleted

## 2017-11-15 ENCOUNTER — Other Ambulatory Visit: Payer: Self-pay

## 2017-11-15 ENCOUNTER — Emergency Department (HOSPITAL_BASED_OUTPATIENT_CLINIC_OR_DEPARTMENT_OTHER)
Admission: EM | Admit: 2017-11-15 | Discharge: 2017-11-15 | Disposition: A | Discharge status: Home / Self Care | Payer: PPO | Attending: Emergency Medicine | Admitting: Emergency Medicine

## 2017-11-15 ENCOUNTER — Emergency Department (HOSPITAL_BASED_OUTPATIENT_CLINIC_OR_DEPARTMENT_OTHER): Payer: PPO

## 2017-11-15 DIAGNOSIS — E039 Hypothyroidism, unspecified: Secondary | ICD-10-CM | POA: Insufficient documentation

## 2017-11-15 DIAGNOSIS — R0789 Other chest pain: Secondary | ICD-10-CM

## 2017-11-15 DIAGNOSIS — R059 Cough, unspecified: Secondary | ICD-10-CM

## 2017-11-15 DIAGNOSIS — R05 Cough: Secondary | ICD-10-CM | POA: Diagnosis not present

## 2017-11-15 DIAGNOSIS — R079 Chest pain, unspecified: Secondary | ICD-10-CM | POA: Diagnosis not present

## 2017-11-15 DIAGNOSIS — E119 Type 2 diabetes mellitus without complications: Secondary | ICD-10-CM | POA: Insufficient documentation

## 2017-11-15 DIAGNOSIS — Z79899 Other long term (current) drug therapy: Secondary | ICD-10-CM | POA: Diagnosis not present

## 2017-11-15 DIAGNOSIS — I10 Essential (primary) hypertension: Secondary | ICD-10-CM | POA: Diagnosis not present

## 2017-11-15 DIAGNOSIS — R0602 Shortness of breath: Secondary | ICD-10-CM

## 2017-11-15 DIAGNOSIS — Z853 Personal history of malignant neoplasm of breast: Secondary | ICD-10-CM | POA: Diagnosis not present

## 2017-11-15 LAB — BASIC METABOLIC PANEL
ANION GAP: 12 (ref 5–15)
BUN: 29 mg/dL — ABNORMAL HIGH (ref 8–23)
CALCIUM: 9.7 mg/dL (ref 8.9–10.3)
CO2: 25 mmol/L (ref 22–32)
Chloride: 101 mmol/L (ref 98–111)
Creatinine, Ser: 1.3 mg/dL — ABNORMAL HIGH (ref 0.44–1.00)
GFR, EST AFRICAN AMERICAN: 45 mL/min — AB (ref >60)
GFR, EST NON AFRICAN AMERICAN: 39 mL/min — AB (ref >60)
GLUCOSE: 155 mg/dL — AB (ref 70–99)
POTASSIUM: 3.7 mmol/L (ref 3.5–5.1)
SODIUM: 138 mmol/L (ref 135–145)

## 2017-11-15 LAB — CBC WITH DIFFERENTIAL/PLATELET
Abs Immature Granulocytes: 0.02 10*3/uL (ref 0.00–0.07)
BASOS PCT: 0 %
Basophils Absolute: 0 10*3/uL (ref 0.0–0.1)
EOS ABS: 0.1 10*3/uL (ref 0.0–0.5)
Eosinophils Relative: 2 %
HCT: 38.5 % (ref 36.0–46.0)
Hemoglobin: 12.4 g/dL (ref 12.0–15.0)
IMMATURE GRANULOCYTES: 0 %
Lymphocytes Relative: 25 %
Lymphs Abs: 2.1 10*3/uL (ref 0.7–4.0)
MCH: 28.1 pg (ref 26.0–34.0)
MCHC: 32.2 g/dL (ref 30.0–36.0)
MCV: 87.3 fL (ref 80.0–100.0)
MONOS PCT: 6 %
Monocytes Absolute: 0.5 10*3/uL (ref 0.1–1.0)
NEUTROS ABS: 5.6 10*3/uL (ref 1.7–7.7)
NRBC: 0 % (ref 0.0–0.2)
Neutrophils Relative %: 67 %
PLATELETS: 243 10*3/uL (ref 150–400)
RBC: 4.41 MIL/uL (ref 3.87–5.11)
RDW: 13.3 % (ref 11.5–15.5)
WBC: 8.4 10*3/uL (ref 4.0–10.5)

## 2017-11-15 LAB — TROPONIN I
Troponin I: <0.03 ng/mL (ref <0.03)
Troponin I: <0.03 ng/mL (ref <0.03)

## 2017-11-15 LAB — D-DIMER, QUANTITATIVE: D-Dimer, Quant: 0.81 ug/mL-FEU — ABNORMAL HIGH (ref 0.00–0.50)

## 2017-11-15 LAB — BRAIN NATRIURETIC PEPTIDE: B NATRIURETIC PEPTIDE 5: 38.9 pg/mL (ref 0.0–100.0)

## 2017-11-15 MED ORDER — SODIUM CHLORIDE 0.9 % IV BOLUS
500.0000 mL | Freq: Once | INTRAVENOUS | Status: AC
  Administered 2017-11-15: 500 mL via INTRAVENOUS

## 2017-11-15 MED ORDER — IOPAMIDOL (ISOVUE-370) INJECTION 76%
100.0000 mL | Freq: Once | INTRAVENOUS | Status: AC | PRN
  Administered 2017-11-15: 80 mL via INTRAVENOUS

## 2017-11-15 NOTE — ED Notes (Signed)
Patient transported to X-ray 

## 2017-11-15 NOTE — ED Triage Notes (Signed)
SOB and pain across her chest when she takes a breath x 3 days.

## 2017-11-15 NOTE — Discharge Instructions (Signed)
Your work-up has been reassuring today.  No signs of blood clot.  No signs of a heart attack.

## 2017-11-15 NOTE — ED Provider Notes (Signed)
[facility] EMERGENCY DEPARTMENT Provider Note   CSN: [id number] Arrival date & time: 11/15/17  1102     History   Chief Complaint Chief Complaint  Patient presents with  . Shortness of Breath    HPI [name] is a 77 y.o. female.  HPI 77 year old female with a past medical history significant for anxiety, breast cancer status post radiation, diabetes, hypertension, GERD, hypothyroidism that presents to the emergency department today for evaluation of cough, shortness of breath and chest pain.  Patient states that 3 days ago she developed a mild productive cough.  She states that since the cough started she has developed some chest wall discomfort under her bilateral breast when she coughs and she also reports exertional dyspnea.  The patient states that she has had shortness of breath for the past 6 to 7 months after receiving radiation.  This is not a new complaint for the patient however the chest pain started after the cough.  Patient does report remote history of breast cancer and received radiation last year.  She has no history of PE/DVT, prolonged immobilization, recent hospitalization/surgeries, unilateral leg swelling or calf tenderness, tobacco use or exogenous hormone use.  Patient has no known cardiac disease.  Reports family cardiac disease.  Patient denies any associated diaphoresis, nausea or vomiting.  The chest pain is pleuritic in nature.  Denies any exertional chest pain.  She has not tried anything for her symptoms prior to arrival.  Nothing makes better or worse.  Patient is concerned that she may be developing pneumonia.  No known fevers or chills at home.  Pt denies any fever, chill, ha, vision changes, lightheadedness, dizziness, congestion, neck pain, abd pain, n/v/d, urinary symptoms, change in bowel habits, melena, hematochezia, lower extremity paresthesias.  Past Medical History:  Diagnosis Date  . Anxiety   . Arthritis    knees  . Cancer  ([place]) 11/2016   Left breast cancer  . Depression   . Diabetes mellitus without complication ([place])   . Diverticulitis   . Diverticulosis    bleeding  . GERD (gastroesophageal reflux disease)   . Hypertension   . Hyperthyroidism    nodule on thyroid, Radioactive, now hypo  . Hypothyroidism, iatrogenic    After RI now hypo on synthroid  . Personal history of radiation therapy     Patient Active Problem List   Diagnosis Date Noted  . Malignant neoplasm of upper-outer quadrant of left breast in female, estrogen receptor positive ([place]) 12/01/2016  . Colitis 04/04/2015  . Acute colitis 04/04/2015  . Depression 01/10/2014  . Hyperlipidemia  pt repeatedly declines statins 12/13/2013  . Post herpetic neuralgia 04/24/2013  . Shingles 04/24/2013  . RUQ abdominal pain 03/28/2013  . Renal calculi 03/27/2013  . Diverticulosis 03/27/2013  . Other postablative hypothyroidism 12/26/2012  . Breast density 12/12/2012  . S/P hysterectomy 11/30/2011  . Multinodular goiter 11/30/2011  . Myalgia 11/30/2011  . GERD (gastroesophageal reflux disease) 11/30/2011  . Insomnia 07/23/2011  . History of anemia 05/10/2011  . History of herpes simplex infection 05/10/2011  . Anxiety 05/04/2011  . Essential hypertension, benign 05/04/2011  . Menopause 05/04/2011  . Osteoporosis 05/04/2011  . DJD (degenerative joint disease) 05/04/2011  . Diverticulitis of sigmoid colon 07/29/2010    Past Surgical History:  Procedure Laterality Date  . ABDOMINAL HYSTERECTOMY  1977  . BLADDER SURGERY     bladder suspension  . BREAST EXCISIONAL BIOPSY Right   . BREAST LUMPECTOMY Left   . BREAST  LUMPECTOMY WITH RADIOACTIVE SEED AND SENTINEL LYMPH NODE BIOPSY Left 12/11/2016   Procedure: BREAST LUMPECTOMY WITH RADIOACTIVE SEED AND SENTINEL LYMPH NODE BIOPSY;  Surgeon: Rolm Bookbinder, MD;  Location: [facility];  Service: General;  Laterality: Left;  . CHOLECYSTECTOMY N/A 03/29/2013   Procedure:  LAPAROSCOPIC CHOLECYSTECTOMY WITH INTRAOPERATIVE CHOLANGIOGRAM;  Surgeon: Edward Jolly, MD;  Location: [facility];  Service: General;  Laterality: N/A;  . JOINT REPLACEMENT Right 2009   total knee replacement  . ROTATOR CUFF REPAIR Left   . TOTAL KNEE REVISION  12/17/2010   Procedure: TOTAL KNEE REVISION;  Surgeon: Dione Plover Aluisio;  Location: [facility];  Service: Orthopedics;  Laterality: Right;     OB History   None      Home Medications    Prior to Admission medications   Medication Sig Start Date End Date Taking? Authorizing Provider  anastrozole (ARIMIDEX) 1 MG tablet Take 1 tablet (1 mg total) by mouth daily. 06/14/17   Magrinat, Virgie Dad, MD  Cholecalciferol (VITAMIN D-3) 5000 units TABS Take by mouth. 06/14/17   Magrinat, Virgie Dad, MD  enalapril (VASOTEC) 10 MG tablet Take 10 mg daily by mouth.    [provider]  escitalopram (LEXAPRO) 10 MG tablet Take 10 mg daily by mouth.    [provider]  hydrochlorothiazide (MICROZIDE) 12.5 MG capsule Take 12.5 mg daily by mouth.    [provider]  Levothyroxine Sodium 100 MCG CAPS Take 100 mcg daily by mouth.  11/21/15   [provider]  omeprazole (PRILOSEC) 40 MG capsule Take 40 mg daily by mouth.    [provider]  OVER THE COUNTER MEDICATION Take 1 tablet by mouth at bedtime as needed ([place]).    [provider]  rosuvastatin (CRESTOR) 5 MG tablet Take 1 tablet by mouth as directed. Monday Wednesday friday 12/04/15   [provider]    Family History Family History  Problem Relation Age of Onset  . Diverticulitis Mother   . Colon cancer Maternal Grandmother   . Heart attack Father   . Breast cancer Daughter     Social History Social History   Tobacco Use  . Smoking status: Never Smoker  . Smokeless tobacco: Never Used  Substance Use Topics  . Alcohol use: Yes    Comment: rarely  . Drug use: No     Allergies   Amitriptyline; Aspirin; Nsaids; Demerol  [meperidine]; and Morphine and related   Review of Systems Review of Systems  All other systems reviewed and are negative.    Physical Exam Updated Vital Signs BP 137/68 (BP Location: Right Arm)   Pulse 76   Temp 98.3 F (36.8 C) (Oral)   Resp 16   Ht 5\' 2"  (1.575 m)   Wt 80.3 kg   SpO2 99%   BMI 32.37 kg/m   Physical Exam  Constitutional: She is oriented to person, place, and time. She appears well-developed and well-nourished.  Non-toxic appearance. No distress.  HENT:  Head: Normocephalic and atraumatic.  Nose: Nose normal.  Mouth/Throat: Oropharynx is clear and moist.  Eyes: Pupils are equal, round, and reactive to light. Conjunctivae are normal. Right eye exhibits no discharge. Left eye exhibits no discharge.  Neck: Normal range of motion. Neck supple. No JVD present. No tracheal deviation present.  Cardiovascular: Normal rate, regular rhythm, normal heart sounds and intact distal pulses. Exam reveals no gallop and no friction rub.  No murmur heard. Pulmonary/Chest: Effort normal and breath sounds normal. No  respiratory distress. She has no decreased breath sounds. She has no wheezes. She has no rhonchi. She has no rales. She exhibits tenderness.  No hypoxia or tachypnea.    Abdominal: Soft. Bowel sounds are normal. She exhibits no distension. There is no tenderness. There is no rebound and no guarding.  Musculoskeletal: Normal range of motion.       Right lower leg: Normal.       Left lower leg: Normal.  No lower extremity edema or calf tenderness.  Lymphadenopathy:    She has no cervical adenopathy.  Neurological: She is alert and oriented to person, place, and time.  Skin: Skin is warm and dry. Capillary refill takes less than 2 seconds. She is not diaphoretic.  Psychiatric: Her behavior is normal. Judgment and thought content normal.  Nursing note and vitals reviewed.    ED Treatments / Results  Labs (all labs ordered are listed, but only abnormal results  are displayed) Labs Reviewed  BASIC METABOLIC PANEL - Abnormal; Notable for the following components:      Result Value   Glucose, Bld 155 (*)    BUN 29 (*)    Creatinine, Ser 1.30 (*)    GFR calc non Af Amer 39 (*)    GFR calc Af Amer 45 (*)    All other components within normal limits  D-DIMER, QUANTITATIVE (NOT AT [facility]) - Abnormal; Notable for the following components:   D-Dimer, Quant 0.81 (*)    All other components within normal limits  CBC WITH DIFFERENTIAL/PLATELET  BRAIN NATRIURETIC PEPTIDE  TROPONIN I  TROPONIN I    EKG EKG Interpretation  Date/Time:  Monday November 15 2017 11:08:48 EDT Ventricular Rate:  97 PR Interval:  146 QRS Duration: 74 QT Interval:  350 QTC Calculation: 444 R Axis:   42 Text Interpretation:  Normal sinus rhythm Nonspecific ST abnormality Abnormal ECG No significant change since last tracing Confirmed by Theotis Burrow [phone]) on 11/15/2017 3:58:54 PM   Radiology Dg Chest 2 View  Result Date: 11/15/2017 CLINICAL DATA:  Pt is having sob and chest pain for 3 days,nonsmoker EXAM: CHEST - 2 VIEW COMPARISON:  11/27/2015 FINDINGS: Lungs are clear. Heart size and mediastinal contours are within normal limits. No effusion. Visualized bones unremarkable.  Cholecystectomy clips. IMPRESSION: No acute cardiopulmonary disease. Electronically Signed   By: Lucrezia Europe M.D.   On: 11/15/2017 11:26   Ct Angio Chest Pe W And/or Wo Contrast  Result Date: 11/15/2017 CLINICAL DATA:  Chest pain and shortness of breath. Positive D-dimer. History of breast cancer. EXAM: CT ANGIOGRAPHY CHEST WITH CONTRAST TECHNIQUE: Multidetector CT imaging of the chest was performed using the standard protocol during bolus administration of intravenous contrast. Multiplanar CT image reconstructions and MIPs were obtained to evaluate the vascular anatomy. CONTRAST:  45mL ISOVUE-370 IOPAMIDOL (ISOVUE-370) INJECTION 76% COMPARISON:  Chest x-ray dated 11/15/2017 and the CT scan dated  03/28/2013 FINDINGS: Cardiovascular: Satisfactory opacification of the pulmonary arteries to the segmental level. No evidence of pulmonary embolism. Normal heart size. No pericardial effusion. Mediastinum/Nodes: No enlarged mediastinal, hilar, or axillary lymph nodes. Thyroid gland, trachea, and esophagus demonstrate no significant findings. Lungs/Pleura: Minimal atelectasis in the periphery of the left upper lobe. No infiltrates or effusions. Upper Abdomen: No acute abnormality. Musculoskeletal: No acute abnormality. Slight accentuation of the thoracic kyphosis. Osteophytes fuse much of the thoracic spine. Review of the MIP images confirms the above findings. IMPRESSION: 1. No pulmonary emboli. 2. Minimal atelectasis in the left upper lobe. Electronically Signed  By: Lorriane Shire M.D.   On: 11/15/2017 13:54    Procedures Procedures (including critical care time)  Medications Ordered in ED Medications  sodium chloride 0.9 % bolus 500 mL ( Intravenous Stopped 11/15/17 1515)  iopamidol (ISOVUE-370) 76 % injection 100 mL (80 mLs Intravenous Contrast Given 11/15/17 1335)     Initial Impression / Assessment and Plan / ED Course  I have reviewed the triage vital signs and the nursing notes.  Pertinent labs & imaging results that were available during my care of the patient were reviewed by me and considered in my medical decision making (see chart for details).     Patient presents the ED for evaluation of cough, chest pain or shortness of breath.  Denies any other associated symptoms.  On exam patient is overall well-appearing and nontoxic.  Vital signs are very reassuring.  Patient is afebrile without any tachycardia or hypotension.  Exam reveals normal lung sounds bilaterally.  Heart regular rate and rhythm with no rubs murmurs or gallops.  No lower extremity edema or calf tenderness.  Neurovascularly intact in all extremities.  Initial lab work shows a mild elevation in creatinine of 1.3 which  is down from patient's baseline.  No significant electrolyte derangement.  No leukocytosis and no anemia.  BNP is reassuring.  Negative delta troponins.  EKG shows normal sinus rhythm with some nonspecific ST changes that is consistent with patient's prior and says no signs of acute ischemic changes.  Chest x-ray reassuring.  Patient is a d-dimer was mildly elevated over adjusted d-dimer.  Patient was ordered a subsequent CTA of chest to rule out PE or occult pneumonia.  CTA of chest was reassuring without any acute findings.  Clinical presentation is not consistent with PE, dissection, ACS, pericarditis, endocarditis.  Given the patient's pain is reproducible on palpation and develop after cough suspicious for costochondritis.  Encouraged to treat with anti-inflammatories and warm compresses at home.  Patient has no signs of CHF exacerbation.  Do not feel the patient needs inpatient stress test at this time however did talk with patient that she would benefit from outpatient follow-up.  Pt is hemodynamically stable, in NAD, & able to ambulate in the ED. Evaluation does not show pathology that would require ongoing emergent intervention or inpatient treatment. I explained the diagnosis to the patient. Pain has been managed & has no complaints prior to dc. Pt is comfortable with above plan and is stable for discharge at this time. All questions were answered prior to disposition. Strict return precautions for f/u to the ED were discussed. Encouraged follow up with PCP.  Patient was also seen and evaluated by my attending Dr. Rex Kras who is agreed with the above plan.  Final Clinical Impressions(s) / ED Diagnoses   Final diagnoses:  Cough  SOB (shortness of breath)  Chest wall pain    ED Discharge Orders    None       Aaron Edelman 11/17/17 0631    Little, Wenda Overland, MD 11/21/17 1714

## 2017-11-15 NOTE — ED Notes (Signed)
Patient transported to CT 

## 2017-11-24 DIAGNOSIS — Z1389 Encounter for screening for other disorder: Secondary | ICD-10-CM | POA: Diagnosis not present

## 2017-11-24 DIAGNOSIS — E876 Hypokalemia: Secondary | ICD-10-CM | POA: Diagnosis not present

## 2017-11-24 DIAGNOSIS — Z0000 Encounter for general adult medical examination without abnormal findings: Secondary | ICD-10-CM | POA: Diagnosis not present

## 2017-11-24 DIAGNOSIS — R946 Abnormal results of thyroid function studies: Secondary | ICD-10-CM | POA: Diagnosis not present

## 2017-11-24 DIAGNOSIS — I10 Essential (primary) hypertension: Secondary | ICD-10-CM | POA: Diagnosis not present

## 2017-11-24 DIAGNOSIS — N183 Chronic kidney disease, stage 3 (moderate): Secondary | ICD-10-CM | POA: Diagnosis not present

## 2017-11-24 DIAGNOSIS — R195 Other fecal abnormalities: Secondary | ICD-10-CM | POA: Diagnosis not present

## 2017-11-24 DIAGNOSIS — K9089 Other intestinal malabsorption: Secondary | ICD-10-CM | POA: Diagnosis not present

## 2017-11-24 DIAGNOSIS — E039 Hypothyroidism, unspecified: Secondary | ICD-10-CM | POA: Diagnosis not present

## 2017-11-24 DIAGNOSIS — E559 Vitamin D deficiency, unspecified: Secondary | ICD-10-CM | POA: Diagnosis not present

## 2017-11-24 DIAGNOSIS — E119 Type 2 diabetes mellitus without complications: Secondary | ICD-10-CM | POA: Diagnosis not present

## 2017-11-24 DIAGNOSIS — Z853 Personal history of malignant neoplasm of breast: Secondary | ICD-10-CM | POA: Diagnosis not present

## 2017-11-24 DIAGNOSIS — Z23 Encounter for immunization: Secondary | ICD-10-CM | POA: Diagnosis not present

## 2017-12-08 DIAGNOSIS — N183 Chronic kidney disease, stage 3 (moderate): Secondary | ICD-10-CM | POA: Diagnosis not present

## 2017-12-08 DIAGNOSIS — E119 Type 2 diabetes mellitus without complications: Secondary | ICD-10-CM | POA: Diagnosis not present

## 2017-12-08 DIAGNOSIS — F331 Major depressive disorder, recurrent, moderate: Secondary | ICD-10-CM | POA: Diagnosis not present

## 2017-12-08 DIAGNOSIS — E039 Hypothyroidism, unspecified: Secondary | ICD-10-CM | POA: Diagnosis not present

## 2017-12-08 DIAGNOSIS — I10 Essential (primary) hypertension: Secondary | ICD-10-CM | POA: Diagnosis not present

## 2017-12-08 DIAGNOSIS — E1165 Type 2 diabetes mellitus with hyperglycemia: Secondary | ICD-10-CM | POA: Diagnosis not present

## 2017-12-08 DIAGNOSIS — Z853 Personal history of malignant neoplasm of breast: Secondary | ICD-10-CM | POA: Diagnosis not present

## 2017-12-08 DIAGNOSIS — M1990 Unspecified osteoarthritis, unspecified site: Secondary | ICD-10-CM | POA: Diagnosis not present

## 2017-12-15 ENCOUNTER — Ambulatory Visit
Admission: RE | Admit: 2017-12-15 | Discharge: 2017-12-15 | Disposition: A | Discharge status: Home / Self Care | Payer: PPO | Source: Ambulatory Visit | Attending: Oncology | Admitting: Oncology

## 2017-12-15 DIAGNOSIS — Z780 Asymptomatic menopausal state: Secondary | ICD-10-CM | POA: Diagnosis not present

## 2017-12-15 DIAGNOSIS — C50412 Malignant neoplasm of upper-outer quadrant of left female breast: Secondary | ICD-10-CM

## 2017-12-15 DIAGNOSIS — M85851 Other specified disorders of bone density and structure, right thigh: Secondary | ICD-10-CM | POA: Diagnosis not present

## 2017-12-15 DIAGNOSIS — Z170 Estrogen receptor positive status [ER+]: Principal | ICD-10-CM

## 2017-12-22 DIAGNOSIS — M79644 Pain in right finger(s): Secondary | ICD-10-CM | POA: Diagnosis not present

## 2017-12-22 DIAGNOSIS — M65341 Trigger finger, right ring finger: Secondary | ICD-10-CM | POA: Diagnosis not present

## 2017-12-30 NOTE — Progress Notes (Signed)
[place]  Telephone:(336) [phone] Fax:(336) [phone]   NB: Patient did not show for he 03/15/2017 appt  ID: [name] DOB: 04-10-1940  MR#: [id number]  TOI#:[id number]  Patient Care Team: Alyssa Shirts, MD as PCP - General (Internal Medicine) [name], Alyssa Dad, MD as Consulting Physician (Oncology) Roseanne Kaufman, MD as Consulting Physician (Orthopedic Surgery) Juanita Craver, MD as Consulting Physician (Gastroenterology) Jari Pigg, MD as Consulting Physician (Dermatology) Kathie Rhodes, MD as Consulting Physician (Urology) Kyung Rudd, MD as Consulting Physician (Radiation Oncology) Rolm Bookbinder, MD as Consulting Physician (General Surgery) OTHER MD:  CHIEF COMPLAINT: estrogen receptor positive breast cancer  CURRENT TREATMENT: Anastrozole    DID NOT SHOW FOR 01/03/2018 VISIT    HISTORY OF CURRENT ILLNESS:  From the original intake note:  [name] initially palpated a lump to her left breast while watching TV which she brought to medical attention and was evaluated 1 week following by her PCP, [name]. She then had an unilateral diagnostic left mammography with tomography and CAD and left breast ultrasonography at [facility] on 11/17/2016 showing a 1.6 cm mass in the upper left breast suspicous for malignancy. The left axilla was benign.  Accordingly on 11/20/2016, she proceeded to biopsy of the left breast area in question. The pathology from this procedure showed ([id number]): invasive mammary carcinoma. Prognostic indicators: ER: 100% positive; PR: 5% positive; Both with strong staining intensity. Proliferation marker Ki67: 15% ; HER-2: negative.  The patient's subsequent history is as detailed below.  INTERVAL HISTORY: [name]   Since her last visit here, she underwent a bone density screening on 12/15/2017, showed a T-score of -1.6, which is considered osteopenic.      REVIEW OF SYSTEMS: [name]  Past Medical History:    Diagnosis Date  . Anxiety   . Arthritis    knees  . Cancer ([place]) 11/2016   Left breast cancer  . Depression   . Diabetes mellitus without complication ([place])   . Diverticulitis   . Diverticulosis    bleeding  . GERD (gastroesophageal reflux disease)   . Hypertension   . Hyperthyroidism    nodule on thyroid, Radioactive, now hypo  . Hypothyroidism, iatrogenic    After RI now hypo on synthroid  . Personal history of radiation therapy    Past Surgical History:  Procedure Laterality Date  . ABDOMINAL HYSTERECTOMY  1977  . BLADDER SURGERY     bladder suspension  . BREAST EXCISIONAL BIOPSY Right   . BREAST LUMPECTOMY Left   . BREAST LUMPECTOMY WITH RADIOACTIVE SEED AND SENTINEL LYMPH NODE BIOPSY Left 12/11/2016   Procedure: BREAST LUMPECTOMY WITH RADIOACTIVE SEED AND SENTINEL LYMPH NODE BIOPSY;  Surgeon: Rolm Bookbinder, MD;  Location: [facility];  Service: General;  Laterality: Left;  . CHOLECYSTECTOMY N/A 03/29/2013   Procedure: LAPAROSCOPIC CHOLECYSTECTOMY WITH INTRAOPERATIVE CHOLANGIOGRAM;  Surgeon: Edward Jolly, MD;  Location: [facility];  Service: General;  Laterality: N/A;  . JOINT REPLACEMENT Right 2009   total knee replacement  . ROTATOR CUFF REPAIR Left   . TOTAL KNEE REVISION  12/17/2010   Procedure: TOTAL KNEE REVISION;  Surgeon: Dione Plover Aluisio;  Location[facility];  Service: Orthopedics;  Laterality: Right;   Family History  Problem Relation Age of Onset  . Diverticulitis Mother   . Colon cancer Maternal Grandmother   . Heart attack Father   . Breast cancer Daughter   Her father died at age 27 from heart failure. Her mother died at age 23  from Alzheimer's. She has 1 sister and no brothers. Her maternal grandmother had colon cancer and she is unsure of what age she was diagnosed with colon cancer. Her father was diagnosed with skin cancer in his late 58's, and she notes that he didn't have melanoma. Her daughter had DCIS breast cancer diagnosed at  5.She hasn't had genetic testing completed as of yet. Her oldest daughter had genetic testing which came back normal. She has a granddaughter that is 26 years old.   GYNECOLOGIC HISTORY:  No LMP recorded. Patient has had a hysterectomy.  Menarche: 77 years old Age at first live birth: 77 years old GP: GxP3 LMP: Had a hysterectomy Contraceptive: OCP HRT: Yes, Estrogen and Progesterone combination for over 30 years.   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 She has 3 children from her first marriage: [name] age 65 who is Mudlogger of a Location manager in [place]. [name] is 83 in Sales in [place]. 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               ADVANCED DIRECTIVES:  Her oldest daughter, [name] is the patient's [place] and can be reached at [phone].   Social History   Tobacco Use  . Smoking status: Never Smoker  . Smokeless tobacco: Never Used  Substance Use Topics  . Alcohol use: Yes    Comment: rarely    Colonoscopy:  PAP:  Bone density: at [facility] on 12/10/2015 found a T score of -1.6    Current Outpatient Medications on File Prior to Visit  Medication Sig Dispense Refill  . anastrozole (ARIMIDEX) 1 MG tablet Take 1 tablet (1 mg total) by mouth daily. 90 tablet 4  . Cholecalciferol (VITAMIN D-3) 5000 units TABS Take by mouth. 30 tablet   . enalapril (VASOTEC) 10 MG tablet Take 10 mg daily by mouth.    . escitalopram (LEXAPRO) 10 MG tablet Take 10 mg daily by mouth.    . hydrochlorothiazide (MICROZIDE) 12.5 MG capsule Take 12.5 mg daily by mouth.    . Levothyroxine Sodium 100 MCG CAPS Take 100 mcg daily by mouth.   1  . omeprazole (PRILOSEC) 40 MG capsule Take 40 mg daily by mouth.    Marland Kitchen OVER THE COUNTER MEDICATION Take 1 tablet by mouth at bedtime as needed (Zyquill).    . rosuvastatin (CRESTOR) 5 MG tablet Take 1  tablet by mouth as directed. Monday Wednesday friday  0   No current facility-administered medications on file prior to visit.    OBJECTIVE: Middle-aged white woman   There were no vitals filed for this visit.  There were no vitals filed for this visit.There is no height or weight on file to calculate BMI.     STUDIES: Dg Bone Density  Result Date: 12/15/2017 EXAM: DUAL X-RAY ABSORPTIOMETRY (DXA) FOR BONE MINERAL DENSITY IMPRESSION: Referring Physician:  Chauncey Cruel Your patient completed a BMD test using Lunar IDXA DXA system ( analysis version: 16 ) manufactured by [employer]. Technologist: WLS PATIENT: Name: Alyssa, Hernandez Patient ID: [id number] Birth Date: December 24, 1940 Height: 62.0 in. Sex: Female Measured: 12/15/2017 Weight: 174.6 lbs. Indications: Advanced Age, Arimidex, Breast Cancer History, Caucasian, Estrogen Deficient, History of Osteopenia, Hypothyroid, Hysterectomy, Low Calcium Intake (269.3), Postmenopausal, Secondary Osteoporosis Fractures: None Treatments: Vitamin D (E933.5) ASSESSMENT: The BMD measured at Femur Neck Right is  0.812 g/cm2 with a T-score of -1.6. This patient is considered osteopenic according to [facility]) criteria. The scan quality is limited by patient body habitus. Lumbar spine was not utilized due to advanced degenerative changes. Per the official positions of the ISCD, it is not possible to quantitatively compare BMD or calculate an [facility] between exams done at different facilities. Site Region Measured Date Measured Age YA BMD Significant CHANGE T-score DualFemur Neck Right 12/15/2017 75.1 -1.6 0.812 g/cm2 DualFemur Total Mean 12/15/2017 75.1 -1.0 0.877 g/cm2 Left Forearm Radius 33% 12/15/2017 75.1 -0.4 0.856 g/cm2 World Health Organization [facility]) criteria for post-menopausal, Caucasian Women: Normal       T-score at or above -1 SD Osteopenia   T-score between -1 and -2.5 SD Osteoporosis T-score at or below -2.5 SD RECOMMENDATION: 1. All  patients should optimize calcium and vitamin D intake. 2. Consider FDA approved medical therapies in postmenopausal women and men aged 37 years and older, based on the following: a. A hip or vertebral (clinical or morphometric) fracture b. T- score < or = -2.5 at the femoral neck or spine after appropriate evaluation to exclude secondary causes c. Low bone mass (T-score between -1.0 and -2.5 at the femoral neck or spine) and a 10 year probability of a hip fracture > or = 3% or a 10 year probability of a major osteoporosis-related fracture > or = 20% based on the US-adapted WHO algorithm d. Clinician judgment and/or patient preferences may indicate treatment for people with 10-year fracture probabilities above or below these levels FOLLOW-UP: People with diagnosed cases of osteoporosis or at high risk for fracture should have regular bone mineral density tests. For patients eligible for Medicare, routine testing is allowed once every 2 years. The testing frequency can be increased to one year for patients who have rapidly progressing disease, those who are receiving or discontinuing medical therapy to restore bone mass, or have additional risk factors. I have reviewed this report and agree with the above findings. [facility] FRAX* 10-year Probability of Fracture Based on femoral neck BMD: DualFemur (Right) Major Osteoporotic Fracture: 11.1% Hip Fracture:                2.3% Population:                  [place] (Caucasian) Risk Factors:                Secondary Osteoporosis *FRAX is a Materials engineer of the [employer] for Metabolic Bone Disease, a [place] (WHO) [facility]. ASSESSMENT: The probability of a major osteoporotic fracture is 11.1 % within the next ten years. The probability of hip fracture is  2.3  % within the next 10 years. Electronically Signed   By: Rolm Baptise M.D.   On: 12/15/2017 11:06    ELIGIBLE FOR AVAILABLE RESEARCH PROTOCOL:  no  ASSESSMENT: 77 y.o. [place] woman s/p left breast upper outer quadrant biopsy 11/20/2016 for a clinical T1c N0, stage IA invasive ductal carcinoma, grade 1, estrogen and progesterone receptor positive, HER-2 not amplified, with an Mib-1 of 15%  (1) s/p left lumpectomy with sentinel lymph node sampling 12/11/2016 for a pT1c pN0 invasive ductal carcinoma, grade 2, with negative margins  (2) oncotype score of 24 predicted a 10-year risk of recurrence outside the breast of 16% if the patient's only systemic therapy was tamoxifen for 5 years.  It also predicted no significant benefit from chemotherapy.  (3) adjuvant radiation 01/27/2017 -  02/23/2017 Site/dose:   The patient initially received a dose of 42.5 Gy in 17 fractions to the breast using whole-breast tangent fields. This was delivered using a 3-D conformal technique. The patient then received a boost to the seroma. This delivered an additional 7.5 Gy in 3 fractions using a 3 field photon technique due to the depth of the seroma. The total dose was 50 Gy.  (4) anastrozole started 03/16/2017  (a) bone density at [facility] 12/10/2015 found a T score of -1.6 ` (b) repeat bone density 12/15/2017 shows a T score of -1.6 (unchanged). PLAN: Shonna Chock, Alyssa Dad, MD  12/30/17 3:00 PM Medical Oncology and Hematology [facility] [address] [place], [place] [address] Tel. [phone]    Fax. [phone]

## 2018-01-03 ENCOUNTER — Encounter: Payer: Self-pay | Admitting: Oncology

## 2018-01-03 ENCOUNTER — Inpatient Hospital Stay: Payer: PPO

## 2018-01-03 ENCOUNTER — Inpatient Hospital Stay: Payer: PPO | Attending: Oncology | Admitting: Oncology

## 2018-01-13 ENCOUNTER — Telehealth: Payer: Self-pay | Admitting: Oncology

## 2018-01-13 NOTE — Telephone Encounter (Signed)
Tried to reach regarding voicemail could not leave a message

## 2018-01-17 ENCOUNTER — Telehealth: Payer: Self-pay

## 2018-01-17 NOTE — Telephone Encounter (Signed)
Spoke with patient and verified appointment, and mailed a calender

## 2018-01-31 NOTE — Progress Notes (Signed)
[place]  Telephone:(336) [phone] Fax:(336) [phone]   NB: Patient did not show for he 03/15/2017 appt  ID: [name] DOB: [birth year]  MR#: [id number]  YKD#:[id number]  Patient Care Team: Lanice Shirts, MD as PCP - General (Internal Medicine) Rorie Delmore, Virgie Dad, MD as Consulting Physician (Oncology) Roseanne Kaufman, MD as Consulting Physician (Orthopedic Surgery) Juanita Craver, MD as Consulting Physician (Gastroenterology) Jari Pigg, MD as Consulting Physician (Dermatology) Kathie Rhodes, MD as Consulting Physician (Urology) Kyung Rudd, MD as Consulting Physician (Radiation Oncology) Rolm Bookbinder, MD as Consulting Physician (General Surgery) OTHER MD:   CHIEF COMPLAINT: estrogen receptor positive breast cancer   CURRENT TREATMENT: Anastrozole   HISTORY OF CURRENT ILLNESS:  From the original intake note:  [name] initially palpated a lump to her left breast while watching TV which she brought to medical attention and was evaluated 1 week following by her PCP, Dr. Coralyn Mark. She then had an unilateral diagnostic left mammography with tomography and CAD and left breast ultrasonography at [facility] on 11/17/2016 showing a 1.6 cm mass in the upper left breast suspicous for malignancy. The left axilla was benign.  Accordingly on 11/20/2016, she proceeded to biopsy of the left breast area in question. The pathology from this procedure showed ([id number]): invasive mammary carcinoma. Prognostic indicators: ER: 100% positive; PR: 5% positive; Both with strong staining intensity. Proliferation marker Ki67: 15% ; HER-2: negative.  The patient's subsequent history is as detailed below.  INTERVAL HISTORY: [name] returns today for follow-up and treatment of of her estrogen receptor positive breast cancer.   The patient continues on anastrozole. She has hot flashes, but they are not unmanageable. She does not have vaginal dryness.   [name]'s last  bone density screening on 12/15/2017, showed a T-score of -1.6, which is considered osteopenic.    Since her last visit here, she underwent a chest xray on 11/15/2017 showing clear lungs. Heart size and mediastinal contours are within normal limits. No effusion. Visualized bones unremarkable.  Cholecystectomy clips.  On 11/15/2017 she presented to the emergency room with cough shortness of breath and chest pain.  She underwent a chest angiography CT with contrast on 11/15/2017 showing no pulmonary emboli. Minimal atelectasis in the left upper lobe.   REVIEW OF SYSTEMS: [name] states that she has been eating too much; she tries to eat lots of vegetables. Both her and her husband have diabetes, so her plan is to eat more fruits and vegetables. For exercise, she has been going to [employer], but her back has been hurting so she has not participated recently. When she is able to participate, she walks for about 30 minutes at a time. The patient denies unusual headaches, visual changes, nausea, vomiting, or dizziness. There has been no unusual cough, phlegm production, or pleurisy. This been no change in bowel or bladder habits. The patient denies unexplained fatigue or unexplained weight loss, bleeding, rash, or fever. A detailed review of systems was otherwise noncontributory.    Past Medical History:  Diagnosis Date  . Anxiety   . Arthritis    knees  . Cancer ([facility] [place]) 11/2016   Left breast cancer  . Depression   . Diabetes mellitus without complication ([facility])   . Diverticulitis   . Diverticulosis    bleeding  . GERD (gastroesophageal reflux disease)   . Hypertension   . Hyperthyroidism    nodule on thyroid, Radioactive, now hypo  . Hypothyroidism, iatrogenic    After RI now hypo on synthroid  .  Personal history of radiation therapy      Past Surgical History:  Procedure Laterality Date  . ABDOMINAL HYSTERECTOMY  1977  . BLADDER SURGERY     bladder suspension  . BREAST EXCISIONAL  BIOPSY Right   . BREAST LUMPECTOMY Left   . BREAST LUMPECTOMY WITH RADIOACTIVE SEED AND SENTINEL LYMPH NODE BIOPSY Left 12/11/2016   Procedure: BREAST LUMPECTOMY WITH RADIOACTIVE SEED AND SENTINEL LYMPH NODE BIOPSY;  Surgeon: Rolm Bookbinder, MD;  Location: [facility];  Service: General;  Laterality: Left;  . CHOLECYSTECTOMY N/A 03/29/2013   Procedure: LAPAROSCOPIC CHOLECYSTECTOMY WITH INTRAOPERATIVE CHOLANGIOGRAM;  Surgeon: Edward Jolly, MD;  Location: [facility];  Service: General;  Laterality: N/A;  . JOINT REPLACEMENT Right 2009   total knee replacement  . ROTATOR CUFF REPAIR Left   . TOTAL KNEE REVISION  12/17/2010   Procedure: TOTAL KNEE REVISION;  Surgeon: Dione Plover Aluisio;  [facility];  Service: Orthopedics;  Laterality: Right;     Family History  Problem Relation Age of Onset  . Diverticulitis Mother   . Colon cancer Maternal Grandmother   . Heart attack Father   . Breast cancer Daughter    Her father died at age 49 from heart failure. Her mother died at age 61 from Alzheimer's. She has 1 sister and no brothers. Her maternal grandmother had colon cancer and she is unsure of what age she was diagnosed with colon cancer. Her father was diagnosed with skin cancer in his late 73's, and she notes that he didn't have melanoma. Her daughter had DCIS breast cancer diagnosed at 44.She hasn't had genetic testing completed as of yet. Her oldest daughter had genetic testing which came back normal. She has a granddaughter that is 82 years old.   GYNECOLOGIC HISTORY:  No LMP recorded. Patient has had a hysterectomy.  Menarche: 78 years old Age at first live birth: 78 years old GP: GxP3 LMP: Had a hysterectomy Contraceptive: OCP HRT: Yes, Estrogen and Progesterone combination for over 30 years.    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 She has 3 children from  her first marriage: [name] age 68 who is Mudlogger of a Location manager in [place]. [name] is 56 in Sales in [place]. 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               ADVANCED DIRECTIVES: Her oldest daughter, [name] is the patient's [place] and can be reached at [phone].   Social History   Tobacco Use  . Smoking status: Never Smoker  . Smokeless tobacco: Never Used  Substance Use Topics  . Alcohol use: Yes    Comment: rarely    Colonoscopy:  PAP:  Bone density: at [facility]ician[facility] on 12/10/2015 found a T score of -1.6  Current Outpatient Medications on File Prior to Visit  Medication Sig Dispense Refill  . anastrozole (ARIMIDEX) 1 MG tablet Take 1 tablet (1 mg total) by mouth daily. 90 tablet 4  . Cholecalciferol (VITAMIN D-3) 5000 units TABS Take by mouth. 30 tablet   . enalapril (VASOTEC) 10 MG tablet Take 10 mg daily by mouth.    . escitalopram (LEXAPRO) 10 MG tablet Take 10 mg daily by mouth.    . hydrochlorothiazide (MICROZIDE) 12.5 MG capsule Take 12.5 mg daily by mouth.    . Levothyroxine Sodium 100 MCG CAPS  Take 100 mcg daily by mouth.   1  . omeprazole (PRILOSEC) 40 MG capsule Take 40 mg daily by mouth.    Marland Kitchen OVER THE COUNTER MEDICATION Take 1 tablet by mouth at bedtime as needed (Zyquill).    . rosuvastatin (CRESTOR) 5 MG tablet Take 1 tablet by mouth as directed. Monday Wednesday friday  0   No current facility-administered medications on file prior to visit.    OBJECTIVE: Middle-aged white woman in no acute distress  Vitals:   02/01/18 1052  BP: (!) 148/98  Pulse: (!) 101  Resp: 18  Temp: 98.6 F (37 C)  SpO2: 100%    Filed Weights   02/01/18 1052  Weight: 182 lb (82.6 kg)  Body mass index is 33.29 kg/m.  Sclerae unicteric, EOMs intact Oropharynx clear and moist No cervical or supraclavicular adenopathy Lungs no rales or rhonchi Heart regular rate and rhythm Abd soft, nontender, positive bowel sounds MSK  no focal spinal tenderness, no upper extremity lymphedema Neuro: nonfocal, well oriented, appropriate affect Breasts: No masses palpated in either breast.  Both axillae are benign  STUDIES: CT scan results discussed with the patient  ELIGIBLE FOR AVAILABLE RESEARCH PROTOCOL: no   ASSESSMENT: 78 y.o. [place] woman s/p left breast upper outer quadrant biopsy 11/20/2016 for a clinical T1c N0, stage IA invasive ductal carcinoma, grade 1, estrogen and progesterone receptor positive, HER-2 not amplified, with an Mib-1 of 15%  (1) s/p left lumpectomy with sentinel lymph node sampling 12/11/2016 for a pT1c pN0 invasive ductal carcinoma, grade 2, with negative margins  (2) oncotype score of 24 predicted a 10-year risk of recurrence outside the breast of 16% if the patient's only systemic therapy was tamoxifen for 5 years.  It also predicted no significant benefit from chemotherapy.  (3) adjuvant radiation 01/27/2017 - 02/23/2017 Site/dose:   The patient initially received a dose of 42.5 Gy in 17 fractions to the breast using whole-breast tangent fields. This was delivered using a 3-D conformal technique. The patient then received a boost to the seroma. This delivered an additional 7.5 Gy in 3 fractions using a 3 field photon technique due to the depth of the seroma. The total dose was 50 Gy.  (4) anastrozole started 03/16/2017  (a) bone density at [facility] 12/10/2015 found a T score of -1.6  (b) repeat bone density 12/15/2017 shows a T score of -1.6 (unchanged).   PLAN: [name] is now a little over a year out from definitive surgery for breast cancer with no evidence of disease recurrence.  This is very favorable.  She is tolerating anastrozole well and the plan will be to continue that a total of 5 years.  Her bone density is stable.  I have encouraged her to walk more and continues to take vitamin D supplementation.  Right now her diet and exercise program are not particularly  helpful.  She tells me she is very motivated to get her and her husband both back to Silver sneakers and on a mostly vegetable diet.  She will see me again in May after her mammography.  She will start seeing me on a yearly basis after that  She knows to call for any other issues that may develop before the next visit.   Denecia Brunette, Virgie Dad, MD  02/01/18 11:16 AM Medical Oncology and Hematology [facility] [address] [place], [place] [address] Tel. [phone]    Fax. [phone]   I, [employer] am acting as a Education administrator for  Chauncey Cruel, MD.   I, Lurline Del MD, have reviewed the above documentation for accuracy and completeness, and I agree with the above.

## 2018-02-01 ENCOUNTER — Inpatient Hospital Stay: Payer: PPO | Attending: Oncology

## 2018-02-01 ENCOUNTER — Telehealth: Payer: Self-pay | Admitting: Oncology

## 2018-02-01 ENCOUNTER — Inpatient Hospital Stay (HOSPITAL_BASED_OUTPATIENT_CLINIC_OR_DEPARTMENT_OTHER): Payer: PPO | Admitting: Oncology

## 2018-02-01 VITALS — BP 148/98 | HR 101 | Temp 98.6°F | Resp 18 | Ht 62.0 in | Wt 182.0 lb

## 2018-02-01 DIAGNOSIS — E039 Hypothyroidism, unspecified: Secondary | ICD-10-CM

## 2018-02-01 DIAGNOSIS — E119 Type 2 diabetes mellitus without complications: Secondary | ICD-10-CM

## 2018-02-01 DIAGNOSIS — M8580 Other specified disorders of bone density and structure, unspecified site: Secondary | ICD-10-CM

## 2018-02-01 DIAGNOSIS — Z170 Estrogen receptor positive status [ER+]: Secondary | ICD-10-CM | POA: Diagnosis not present

## 2018-02-01 DIAGNOSIS — C50412 Malignant neoplasm of upper-outer quadrant of left female breast: Secondary | ICD-10-CM

## 2018-02-01 DIAGNOSIS — Z79811 Long term (current) use of aromatase inhibitors: Secondary | ICD-10-CM

## 2018-02-01 LAB — COMPREHENSIVE METABOLIC PANEL
ALK PHOS: 99 U/L (ref 38–126)
ALT: 18 U/L (ref 0–44)
AST: 14 U/L — ABNORMAL LOW (ref 15–41)
Albumin: 3.7 g/dL (ref 3.5–5.0)
Anion gap: 13 (ref 5–15)
BUN: 18 mg/dL (ref 8–23)
CO2: 26 mmol/L (ref 22–32)
Calcium: 9.5 mg/dL (ref 8.9–10.3)
Chloride: 101 mmol/L (ref 98–111)
Creatinine, Ser: 1.23 mg/dL — ABNORMAL HIGH (ref 0.44–1.00)
GFR calc non Af Amer: 43 mL/min — ABNORMAL LOW (ref >60)
GFR, EST AFRICAN AMERICAN: 50 mL/min — AB (ref >60)
Glucose, Bld: 179 mg/dL — ABNORMAL HIGH (ref 70–99)
Potassium: 3.5 mmol/L (ref 3.5–5.1)
Sodium: 140 mmol/L (ref 135–145)
TOTAL PROTEIN: 7.1 g/dL (ref 6.5–8.1)
Total Bilirubin: 0.5 mg/dL (ref 0.3–1.2)

## 2018-02-01 LAB — CBC WITH DIFFERENTIAL/PLATELET
ABS IMMATURE GRANULOCYTES: 0.05 10*3/uL (ref 0.00–0.07)
BASOS PCT: 1 %
Basophils Absolute: 0 10*3/uL (ref 0.0–0.1)
EOS PCT: 2 %
Eosinophils Absolute: 0.2 10*3/uL (ref 0.0–0.5)
HCT: 38.6 % (ref 36.0–46.0)
Hemoglobin: 12.4 g/dL (ref 12.0–15.0)
Immature Granulocytes: 1 %
Lymphocytes Relative: 24 %
Lymphs Abs: 1.6 10*3/uL (ref 0.7–4.0)
MCH: 27.9 pg (ref 26.0–34.0)
MCHC: 32.1 g/dL (ref 30.0–36.0)
MCV: 86.9 fL (ref 80.0–100.0)
MONO ABS: 0.4 10*3/uL (ref 0.1–1.0)
Monocytes Relative: 5 %
NEUTROS ABS: 4.6 10*3/uL (ref 1.7–7.7)
Neutrophils Relative %: 67 %
PLATELETS: 248 10*3/uL (ref 150–400)
RBC: 4.44 MIL/uL (ref 3.87–5.11)
RDW: 14.3 % (ref 11.5–15.5)
WBC: 6.8 10*3/uL (ref 4.0–10.5)
nRBC: 0 % (ref 0.0–0.2)

## 2018-02-01 NOTE — Telephone Encounter (Signed)
Gave avs and calendar ° °

## 2018-02-04 ENCOUNTER — Other Ambulatory Visit: Payer: Self-pay | Admitting: Orthopedic Surgery

## 2018-02-04 DIAGNOSIS — G8929 Other chronic pain: Secondary | ICD-10-CM

## 2018-02-04 DIAGNOSIS — M545 Low back pain: Principal | ICD-10-CM

## 2018-02-07 DIAGNOSIS — I10 Essential (primary) hypertension: Secondary | ICD-10-CM | POA: Diagnosis not present

## 2018-02-07 DIAGNOSIS — E039 Hypothyroidism, unspecified: Secondary | ICD-10-CM | POA: Diagnosis not present

## 2018-02-11 ENCOUNTER — Other Ambulatory Visit: Payer: PPO

## 2018-02-15 DIAGNOSIS — E1165 Type 2 diabetes mellitus with hyperglycemia: Secondary | ICD-10-CM | POA: Diagnosis not present

## 2018-02-15 DIAGNOSIS — E119 Type 2 diabetes mellitus without complications: Secondary | ICD-10-CM | POA: Diagnosis not present

## 2018-02-15 DIAGNOSIS — E039 Hypothyroidism, unspecified: Secondary | ICD-10-CM | POA: Diagnosis not present

## 2018-02-15 DIAGNOSIS — F331 Major depressive disorder, recurrent, moderate: Secondary | ICD-10-CM | POA: Diagnosis not present

## 2018-02-15 DIAGNOSIS — N183 Chronic kidney disease, stage 3 (moderate): Secondary | ICD-10-CM | POA: Diagnosis not present

## 2018-02-15 DIAGNOSIS — Z853 Personal history of malignant neoplasm of breast: Secondary | ICD-10-CM | POA: Diagnosis not present

## 2018-02-15 DIAGNOSIS — M1990 Unspecified osteoarthritis, unspecified site: Secondary | ICD-10-CM | POA: Diagnosis not present

## 2018-02-15 DIAGNOSIS — I10 Essential (primary) hypertension: Secondary | ICD-10-CM | POA: Diagnosis not present

## 2018-02-16 DIAGNOSIS — R300 Dysuria: Secondary | ICD-10-CM | POA: Diagnosis not present

## 2018-02-18 DIAGNOSIS — N200 Calculus of kidney: Secondary | ICD-10-CM | POA: Diagnosis not present

## 2018-02-21 DIAGNOSIS — R300 Dysuria: Secondary | ICD-10-CM | POA: Diagnosis not present

## 2018-02-21 DIAGNOSIS — I10 Essential (primary) hypertension: Secondary | ICD-10-CM | POA: Diagnosis not present

## 2018-02-25 ENCOUNTER — Ambulatory Visit
Admission: RE | Admit: 2018-02-25 | Discharge: 2018-02-25 | Disposition: A | Discharge status: Home / Self Care | Payer: PPO | Source: Ambulatory Visit | Attending: Orthopedic Surgery | Admitting: Orthopedic Surgery

## 2018-02-25 ENCOUNTER — Other Ambulatory Visit: Payer: Self-pay | Admitting: Orthopedic Surgery

## 2018-02-25 DIAGNOSIS — G8929 Other chronic pain: Secondary | ICD-10-CM

## 2018-02-25 DIAGNOSIS — M545 Low back pain, unspecified: Secondary | ICD-10-CM

## 2018-02-25 DIAGNOSIS — M5116 Intervertebral disc disorders with radiculopathy, lumbar region: Secondary | ICD-10-CM | POA: Diagnosis not present

## 2018-02-25 MED ORDER — IOPAMIDOL (ISOVUE-M 200) INJECTION 41%
1.0000 mL | Freq: Once | INTRAMUSCULAR | Status: AC
  Administered 2018-02-25: 1 mL via EPIDURAL

## 2018-02-25 MED ORDER — METHYLPREDNISOLONE ACETATE 40 MG/ML INJ SUSP (RADIOLOG
120.0000 mg | Freq: Once | INTRAMUSCULAR | Status: AC
  Administered 2018-02-25: 120 mg via EPIDURAL

## 2018-03-10 DIAGNOSIS — H00022 Hordeolum internum right lower eyelid: Secondary | ICD-10-CM | POA: Diagnosis not present

## 2018-03-23 ENCOUNTER — Other Ambulatory Visit: Payer: Self-pay | Admitting: Oncology

## 2018-03-23 DIAGNOSIS — Z9889 Other specified postprocedural states: Secondary | ICD-10-CM

## 2018-03-24 DIAGNOSIS — I10 Essential (primary) hypertension: Secondary | ICD-10-CM | POA: Diagnosis not present

## 2018-03-24 DIAGNOSIS — E119 Type 2 diabetes mellitus without complications: Secondary | ICD-10-CM | POA: Diagnosis not present

## 2018-03-24 DIAGNOSIS — L304 Erythema intertrigo: Secondary | ICD-10-CM | POA: Diagnosis not present

## 2018-03-24 DIAGNOSIS — G252 Other specified forms of tremor: Secondary | ICD-10-CM | POA: Diagnosis not present

## 2018-03-24 DIAGNOSIS — R195 Other fecal abnormalities: Secondary | ICD-10-CM | POA: Diagnosis not present

## 2018-03-29 ENCOUNTER — Encounter: Payer: Self-pay | Admitting: Neurology

## 2018-04-08 NOTE — Progress Notes (Signed)
Subjective:   [name] was seen in consultation in the movement disorder clinic at the request of Schoenhoff, [name], *.  This patient is accompanied in the office by her sister who supplements the history.  The evaluation is for tremor.  Tremor started approximately 1.5 years-2 years ago and involves the bilateral UE.  Tremor is most noticeable when grabbing something.   There is no family hx of tremor.    Affected by caffeine:  No.  (doesn't drink very much d/t palpitations) Affected by alcohol:  No. Affected by stress:  No. Affected by fatigue:  Doesn't know Spills soup if on spoon:  No. Spills glass of liquid if full:  No. Affects ADL's (tying shoes, brushing teeth, etc):  No.  Current/Previously tried tremor medications: none  Current medications that may exacerbate tremor:  n/a  Outside reports reviewed: historical medical records, lab reports, office notes and referral letter/letters.  Her TSH was low in October.  Reports that her primary care physician adjusted her Synthroid because of this, but then the pharmacy began to refill the wrong dose over the last few months, so she has been taking the wrong dose the last few months.  Allergies  Allergen Reactions  . Amitriptyline Other (See Comments)    hallucinations  . Aspirin Other (See Comments)    Bleeding  . Nsaids Other (See Comments)    Lower GI bleeding  . Demerol [Meperidine Hcl] Other (See Comments)    Causes migraines  . Demerol [Meperidine] Other (See Comments)    migraines  . Morphine And Related Rash    Outpatient Encounter Medications as of 04/12/2018  Medication Sig  . Cholecalciferol (VITAMIN D-3) 5000 units TABS Take by mouth.  . enalapril (VASOTEC) 10 MG tablet Take 10 mg daily by mouth.  . escitalopram (LEXAPRO) 10 MG tablet Take 10 mg daily by mouth.  Marland Kitchen glimepiride (AMARYL) 1 MG tablet Take 1 mg by mouth 2 (two) times daily.  . hydrochlorothiazide (MICROZIDE) 12.5 MG capsule Take 12.5 mg  daily by mouth.  . Levothyroxine Sodium 100 MCG CAPS Take 100 mcg daily by mouth.   . rosuvastatin (CRESTOR) 5 MG tablet Take 1 tablet by mouth as directed. Monday Wednesday friday  . [DISCONTINUED] anastrozole (ARIMIDEX) 1 MG tablet Take 1 tablet (1 mg total) by mouth daily.  . [DISCONTINUED] omeprazole (PRILOSEC) 40 MG capsule Take 40 mg daily by mouth.  . [DISCONTINUED] OVER THE COUNTER MEDICATION Take 1 tablet by mouth at bedtime as needed (Zyquill).   No facility-administered encounter medications on file as of 04/12/2018.     Past Medical History:  Diagnosis Date  . Anxiety   . Arthritis    knees  . Cancer ([place]) 11/2016   Left breast cancer  . Depression   . Diabetes mellitus without complication ([place])   . Diverticulitis   . Diverticulosis    bleeding  . GERD (gastroesophageal reflux disease)   . Hypertension   . Hyperthyroidism    nodule on thyroid, Radioactive, now hypo  . Hypothyroidism, iatrogenic    After RI now hypo on synthroid  . Personal history of radiation therapy     Past Surgical History:  Procedure Laterality Date  . ABDOMINAL HYSTERECTOMY  1977  . BLADDER SURGERY     bladder suspension  . BREAST EXCISIONAL BIOPSY Right   . BREAST LUMPECTOMY Left   . BREAST LUMPECTOMY WITH RADIOACTIVE SEED AND SENTINEL LYMPH NODE BIOPSY Left 12/11/2016   Procedure: BREAST LUMPECTOMY WITH RADIOACTIVE  SEED AND SENTINEL LYMPH NODE BIOPSY;  Surgeon: Rolm Bookbinder, MD;  Location: [facility];  Service: General;  Laterality: Left;  . CHOLECYSTECTOMY N/A 03/29/2013   Procedure: LAPAROSCOPIC CHOLECYSTECTOMY WITH INTRAOPERATIVE CHOLANGIOGRAM;  Surgeon: Edward Jolly, MD;  Location: [facility];  Service: General;  Laterality: N/A;  . JOINT REPLACEMENT Right 2009   total knee replacement  . ROTATOR CUFF REPAIR Left   . TOTAL KNEE REVISION  12/17/2010   Procedure: TOTAL KNEE REVISION;  Surgeon: Dione Plover Aluisio;  [facility]: [facility];  Service: Orthopedics;   Laterality: Right;    Social History   Socioeconomic History  . Marital status: Married    Spouse name: Not on file  . Number of children: Not on file  . Years of education: Not on file  . Highest education level: Not on file  Occupational History  . Not on file  Social Needs  . Financial resource strain: Not on file  . Food insecurity:    Worry: Not on file    Inability: Not on file  . Transportation needs:    Medical: Not on file    Non-medical: Not on file  Tobacco Use  . Smoking status: Never Smoker  . Smokeless tobacco: Never Used  Substance and Sexual Activity  . Alcohol use: Yes    Comment: rarely  . Drug use: No  . Sexual activity: Not Currently    Birth control/protection: Surgical    Comment: Hysterectomy  Lifestyle  . Physical activity:    Days per week: Not on file    Minutes per session: Not on file  . Stress: Not on file  Relationships  . Social connections:    Talks on phone: Not on file    Gets together: Not on file    Attends religious service: Not on file    Active member of club or organization: Not on file    Attends meetings of clubs or organizations: Not on file    Relationship status: Not on file  . Intimate partner violence:    Fear of current or ex partner: Not on file    Emotionally abused: Not on file    Physically abused: Not on file    Forced sexual activity: Not on file  Other Topics Concern  . Not on file  Social History Narrative  . Not on file    Family Status  Relation Name Status  . Mother  Deceased  . MGM  Deceased  . Sister  Alive  . Father  Deceased  . Daughter  Alive  . MGF  Deceased  . PGM  Deceased  . PGF  Deceased  . Daughter  Alive  . Son  Alive    Review of Systems Review of Systems  Constitutional: Negative.   Eyes: Negative.   Respiratory: Negative.   Cardiovascular: Negative.   Gastrointestinal: Positive for diarrhea.  Genitourinary: Negative.   Musculoskeletal: Negative.   Skin: Negative.       Objective:   VITALS:   Vitals:   04/12/18 1256  BP: 132/70  Pulse: 88  Temp: 98 F (36.7 C)  TempSrc: Oral  SpO2: 95%  Weight: 182 lb (82.6 kg)  Height: 5' 2.5" (1.588 m)   Gen:  Appears stated age and in NAD. HEENT:  Normocephalic, atraumatic. The mucous membranes are moist. The superficial temporal arteries are without ropiness or tenderness. Cardiovascular: Regular rate and rhythm. Lungs: Clear to auscultation bilaterally. Neck: There are no carotid bruits noted bilaterally.  NEUROLOGICAL:  Orientation:  The patient is alert and oriented x 3.  Recent and remote memory are intact.  Attention span and concentration are normal.  Able to name objects and repeat without trouble.  Fund of knowledge is appropriate Cranial nerves: There is good facial symmetry. The pupils are equal round and reactive to light bilaterally. Fundoscopic exam reveals clear disc margins bilaterally. Extraocular muscles are intact and visual fields are full to confrontational testing. Speech is fluent and clear. Soft palate rises symmetrically and there is no tongue deviation. Hearing is intact to conversational tone. Tone: Tone is good throughout. Sensation: Sensation is intact to light touch and pinprick throughout (facial, trunk, extremities). Vibration is intact at the bilateral big toe. There is no extinction with double simultaneous stimulation. There is no sensory dermatomal level identified. Coordination:  The patient has no dysdiadichokinesia or dysmetria. Motor: Strength is 5/5 in the bilateral upper and lower extremities.  Shoulder shrug is equal bilaterally.  There is no pronator drift.  There are no fasciculations noted. DTR's: Deep tendon reflexes are 2/4 at the bilateral biceps, triceps, brachioradialis, left patella, and absent at the right and bilateral achilles.  Plantar responses are downgoing bilaterally. Gait and Station: The patient is able to ambulate without difficulty.  She is able to  stand in the Romberg position.  MOVEMENT EXAM: Tremor:  There is no rest tremor.  There is no postural tremor.  She has no tremor when given a weight.  She has no trouble with Archimedes spirals on the right.  She has some trouble with getting the pen to the paper on the left because of tremor.  She has tremor on both hands when asked to pour water from one glass to another, but does not spill a lot of the water.    Chemistry      Component Value Date/Time   NA 140 02/01/2018 1029   NA 140 12/02/2016 1213   K 3.5 02/01/2018 1029   K 3.8 12/02/2016 1213   CL 101 02/01/2018 1029   CO2 26 02/01/2018 1029   CO2 24 12/02/2016 1213   BUN 18 02/01/2018 1029   BUN 18.8 12/02/2016 1213   CREATININE 1.23 (H) 02/01/2018 1029   CREATININE 1.1 12/02/2016 1213      Component Value Date/Time   CALCIUM 9.5 02/01/2018 1029   CALCIUM 9.9 12/02/2016 1213   ALKPHOS 99 02/01/2018 1029   ALKPHOS 89 12/02/2016 1213   AST 14 (L) 02/01/2018 1029   AST 15 12/02/2016 1213   ALT 18 02/01/2018 1029   ALT 16 12/02/2016 1213   BILITOT 0.5 02/01/2018 1029   BILITOT 0.46 12/02/2016 1213     Primary care sent records from March 24, 2018.  Hemoglobin A1c was 7.7.  No TSH was done.  Last TSH was done on November 25, 2017 and was low at 0.04.     Assessment/Plan:   1.  Tremor.  -This is likely essential tremor and we discussed nature and pathophysiology of this.  We did discuss that this gradually can continue to get worse with time.  Her thyroid, however, was overtreated back in October.  Her primary care physician did adjust her Synthroid for this, but she reports that the pharmacy has not been feeling the right dose the last few months.  I will recheck this just to make sure that this is not contributing to her tremor.  If her TSH is low, then would recommend adjustment of her Synthroid before we start her  on medication for tremor.  If not, she really would like some treatment for the tremor.  We discussed  several medications and ultimately decided on primidone if she needs medication.  We discussed risks, benefits, and side effects in anticipation of this.  She had questions and I answered them to the best of my ability today.  CC:  Schoenhoff, [name], MD

## 2018-04-12 ENCOUNTER — Ambulatory Visit (INDEPENDENT_AMBULATORY_CARE_PROVIDER_SITE_OTHER): Payer: PPO | Admitting: Neurology

## 2018-04-12 ENCOUNTER — Encounter: Payer: Self-pay | Admitting: Neurology

## 2018-04-12 ENCOUNTER — Other Ambulatory Visit (INDEPENDENT_AMBULATORY_CARE_PROVIDER_SITE_OTHER): Payer: PPO

## 2018-04-12 ENCOUNTER — Other Ambulatory Visit: Payer: Self-pay

## 2018-04-12 VITALS — BP 132/70 | HR 88 | Temp 98.0°F | Ht 62.5 in | Wt 182.0 lb

## 2018-04-12 DIAGNOSIS — E039 Hypothyroidism, unspecified: Secondary | ICD-10-CM | POA: Diagnosis not present

## 2018-04-12 DIAGNOSIS — R251 Tremor, unspecified: Secondary | ICD-10-CM | POA: Diagnosis not present

## 2018-04-12 LAB — TSH: TSH: 0.18 m[IU]/L — AB (ref 0.40–4.50)

## 2018-04-12 NOTE — Patient Instructions (Signed)
1. Your provider has requested that you have labwork completed today. Please go to [facility] ([id number]) on the second floor of this building before leaving the office today. You do not need to check in. If you are not called within 15 minutes please check with the front desk.   

## 2018-04-13 ENCOUNTER — Telehealth: Payer: Self-pay | Admitting: Neurology

## 2018-04-13 NOTE — Telephone Encounter (Signed)
Lab results sent to Dr. Coralyn Mark.

## 2018-04-13 NOTE — Telephone Encounter (Signed)
Patient made aware of results and recommendations. She will follow up with PCP re: synthroid.

## 2018-04-13 NOTE — Telephone Encounter (Signed)
-----   Message from [place], DO sent at 04/13/2018  8:19 AM EDT ----- Luvenia Starch, fax results to PCP.  Let pt know that tremor may becoming from this as synthroid may need adjusted.  Hold on tremor meds for now.

## 2018-05-19 DIAGNOSIS — E039 Hypothyroidism, unspecified: Secondary | ICD-10-CM | POA: Diagnosis not present

## 2018-05-19 DIAGNOSIS — I10 Essential (primary) hypertension: Secondary | ICD-10-CM | POA: Diagnosis not present

## 2018-05-23 ENCOUNTER — Other Ambulatory Visit: Payer: Self-pay | Admitting: Internal Medicine

## 2018-05-23 DIAGNOSIS — N200 Calculus of kidney: Secondary | ICD-10-CM | POA: Diagnosis not present

## 2018-05-23 DIAGNOSIS — E1165 Type 2 diabetes mellitus with hyperglycemia: Secondary | ICD-10-CM | POA: Diagnosis not present

## 2018-05-23 DIAGNOSIS — R7989 Other specified abnormal findings of blood chemistry: Secondary | ICD-10-CM

## 2018-05-23 DIAGNOSIS — Z853 Personal history of malignant neoplasm of breast: Secondary | ICD-10-CM | POA: Diagnosis not present

## 2018-05-23 DIAGNOSIS — R944 Abnormal results of kidney function studies: Secondary | ICD-10-CM | POA: Diagnosis not present

## 2018-05-23 DIAGNOSIS — E039 Hypothyroidism, unspecified: Secondary | ICD-10-CM | POA: Diagnosis not present

## 2018-05-23 DIAGNOSIS — I10 Essential (primary) hypertension: Secondary | ICD-10-CM | POA: Diagnosis not present

## 2018-05-25 ENCOUNTER — Ambulatory Visit
Admission: RE | Admit: 2018-05-25 | Discharge: 2018-05-25 | Disposition: A | Discharge status: Home / Self Care | Payer: PPO | Source: Ambulatory Visit | Attending: Internal Medicine | Admitting: Internal Medicine

## 2018-05-25 ENCOUNTER — Other Ambulatory Visit: Payer: Self-pay

## 2018-05-25 DIAGNOSIS — N200 Calculus of kidney: Secondary | ICD-10-CM | POA: Diagnosis not present

## 2018-05-25 DIAGNOSIS — R7989 Other specified abnormal findings of blood chemistry: Secondary | ICD-10-CM

## 2018-05-27 DIAGNOSIS — R944 Abnormal results of kidney function studies: Secondary | ICD-10-CM | POA: Diagnosis not present

## 2018-06-01 DIAGNOSIS — Z7984 Long term (current) use of oral hypoglycemic drugs: Secondary | ICD-10-CM | POA: Diagnosis not present

## 2018-06-01 DIAGNOSIS — I10 Essential (primary) hypertension: Secondary | ICD-10-CM | POA: Diagnosis not present

## 2018-06-01 DIAGNOSIS — E7800 Pure hypercholesterolemia, unspecified: Secondary | ICD-10-CM | POA: Diagnosis not present

## 2018-06-01 DIAGNOSIS — E039 Hypothyroidism, unspecified: Secondary | ICD-10-CM | POA: Diagnosis not present

## 2018-06-01 DIAGNOSIS — E119 Type 2 diabetes mellitus without complications: Secondary | ICD-10-CM | POA: Diagnosis not present

## 2018-06-01 DIAGNOSIS — N183 Chronic kidney disease, stage 3 (moderate): Secondary | ICD-10-CM | POA: Diagnosis not present

## 2018-06-08 DIAGNOSIS — E119 Type 2 diabetes mellitus without complications: Secondary | ICD-10-CM | POA: Diagnosis not present

## 2018-06-08 DIAGNOSIS — E7800 Pure hypercholesterolemia, unspecified: Secondary | ICD-10-CM | POA: Diagnosis not present

## 2018-06-08 DIAGNOSIS — Z0131 Encounter for examination of blood pressure with abnormal findings: Secondary | ICD-10-CM | POA: Diagnosis not present

## 2018-06-09 DIAGNOSIS — L570 Actinic keratosis: Secondary | ICD-10-CM | POA: Diagnosis not present

## 2018-06-09 DIAGNOSIS — Z808 Family history of malignant neoplasm of other organs or systems: Secondary | ICD-10-CM | POA: Diagnosis not present

## 2018-06-09 DIAGNOSIS — D2272 Melanocytic nevi of left lower limb, including hip: Secondary | ICD-10-CM | POA: Diagnosis not present

## 2018-06-09 DIAGNOSIS — Z85828 Personal history of other malignant neoplasm of skin: Secondary | ICD-10-CM | POA: Diagnosis not present

## 2018-06-09 DIAGNOSIS — L821 Other seborrheic keratosis: Secondary | ICD-10-CM | POA: Diagnosis not present

## 2018-06-13 ENCOUNTER — Other Ambulatory Visit: Payer: Self-pay

## 2018-06-13 ENCOUNTER — Ambulatory Visit
Admission: RE | Admit: 2018-06-13 | Discharge: 2018-06-13 | Disposition: A | Discharge status: Home / Self Care | Payer: PPO | Source: Ambulatory Visit | Attending: Oncology | Admitting: Oncology

## 2018-06-13 DIAGNOSIS — E119 Type 2 diabetes mellitus without complications: Secondary | ICD-10-CM | POA: Diagnosis not present

## 2018-06-13 DIAGNOSIS — Z9889 Other specified postprocedural states: Secondary | ICD-10-CM

## 2018-06-13 DIAGNOSIS — I10 Essential (primary) hypertension: Secondary | ICD-10-CM | POA: Diagnosis not present

## 2018-06-13 DIAGNOSIS — N183 Chronic kidney disease, stage 3 (moderate): Secondary | ICD-10-CM | POA: Diagnosis not present

## 2018-06-14 ENCOUNTER — Telehealth: Payer: Self-pay | Admitting: Oncology

## 2018-06-14 NOTE — Telephone Encounter (Signed)
Spoke with pt re 06/16/18 office visit and she agrees to phone call.

## 2018-06-15 NOTE — Progress Notes (Signed)
[facility]  Telephone:([phone]) [phone] Fax:(336) [phone]   NB: Patient did not show for he 03/15/2017 appt  ID: [name] DOB: 06/13/1940  MR#: [id number]  JYN#:[id number]  Patient Care Team: Patient, No Pcp Per as PCP - General (General Practice) Clerence Gubser, Virgie Dad, MD as Consulting Physician (Oncology) Roseanne Kaufman, MD as Consulting Physician (Orthopedic Surgery) Juanita Craver, MD as Consulting Physician (Gastroenterology) Jari Pigg, MD as Consulting Physician (Dermatology) Kathie Rhodes, MD as Consulting Physician (Urology) Kyung Rudd, MD as Consulting Physician (Radiation Oncology) Rolm Bookbinder, MD as Consulting Physician (General Surgery) OTHER MD:  I connected with [name] on 06/16/18 at 11:00 AM EDT by telephone visit and verified that I am speaking with the correct person using two identifiers.   I discussed the limitations, risks, security and privacy concerns of performing an evaluation and management service by telemedicine and the availability of in-person appointments. I also discussed with the patient that there may be a patient responsible charge related to this service. The patient expressed understanding and agreed to proceed.   Other persons participating in the visit and their role in the encounter: Wilburn Mylar, scribe   Patient's location: home  Provider's location: [place]    CHIEF COMPLAINT: estrogen receptor positive breast cancer  CURRENT TREATMENT: Anastrozole   INTERVAL HISTORY: [name] returns today for follow-up and treatment of of her estrogen receptor positive breast cancer.   The patient had been on anastrozole. She reports she ran out of medication in January, and she was then told the doctor declined to refill it.  She did not call us to notify us of this problem.  Importantly she did not notice a difference being off of it: She was having no symptoms from the medication.  [name]'s last  bone density screening on 12/15/2017, showed a T-score of -1.6, which is considered osteopenic.    Since her last visit, she underwent bilateral diagnostic mammography with tomography at [facility] on 06/13/2018 showing: breast density category C; no evidence of malignancy in either breast.   REVIEW OF SYSTEMS: [name] reports doing well overall. She hurt her back recently and has been unable to exercise. She would like to go back to the [facility] to exercise once she recovers. She states her husband is on leave from work due to the virus. He does the shopping. Since her last visit, she underwent epidural steroid injection to her L5-S1 for pain on 02/25/2018. She also underwent renal ultrasound on 05/25/2018 showing: no evidence of hydronephrosis; mild left-sided renal cortical thickening; left-sided nephrolithiasis; bilateral simple cysts.  The patient denies unusual headaches, visual changes, nausea, vomiting, stiff neck, dizziness, or gait imbalance. There has been no cough, phlegm production, or pleurisy, no chest pain or pressure, and no change in bowel or bladder habits. The patient denies fever, rash, bleeding, unexplained fatigue or unexplained weight loss. A detailed review of systems was otherwise entirely negative.   HISTORY OF CURRENT ILLNESS:  From the original intake note:  [name] initially palpated a lump to her left breast while watching TV which she brought to medical attention and was evaluated 1 week following by her PCP, Dr. Coralyn Mark. She then had an unilateral diagnostic left mammography with tomography and CAD and left breast ultrasonography at [facility] on 11/17/2016 showing a 1.6 cm mass in the upper left breast suspicous for malignancy. The left axilla was benign.  Accordingly on 11/20/2016, she proceeded to biopsy of the left breast area in question.  The pathology from this procedure showed ([id number]): invasive mammary carcinoma. Prognostic indicators: ER: 100%  positive; PR: 5% positive; Both with strong staining intensity. Proliferation marker Ki67: 15% ; HER-2: negative.  The patient's subsequent history is as detailed below.   PAST MEDICAL HISTORY: Past Medical History:  Diagnosis Date  . Anxiety   . Arthritis    knees  . Cancer ([place]) 11/2016   Left breast cancer  . Depression   . Diabetes mellitus without complication ([place])   . Diverticulitis   . Diverticulosis    bleeding  . GERD (gastroesophageal reflux disease)   . Hypertension   . Hyperthyroidism    nodule on thyroid, Radioactive, now hypo  . Hypothyroidism, iatrogenic    After RI now hypo on synthroid  . Personal history of radiation therapy     PAST SURGICAL HISTORY: Past Surgical History:  Procedure Laterality Date  . ABDOMINAL HYSTERECTOMY  1977  . BLADDER SURGERY     bladder suspension  . BREAST EXCISIONAL BIOPSY Right   . BREAST LUMPECTOMY Left 2018  . BREAST LUMPECTOMY WITH RADIOACTIVE SEED AND SENTINEL LYMPH NODE BIOPSY Left 12/11/2016   Procedure: BREAST LUMPECTOMY WITH RADIOACTIVE SEED AND SENTINEL LYMPH NODE BIOPSY;  Surgeon: Rolm Bookbinder, MD;  Location: [facility];  Service: General;  Laterality: Left;  . CHOLECYSTECTOMY N/A 03/29/2013   Procedure: LAPAROSCOPIC CHOLECYSTECTOMY WITH INTRAOPERATIVE CHOLANGIOGRAM;  Surgeon: Edward Jolly, MD;  Location: [facility];  Service: General;  Laterality: N/A;  . JOINT REPLACEMENT Right 2009   total knee replacement  . ROTATOR CUFF REPAIR Left   . TOTAL KNEE REVISION  12/17/2010   Procedure: TOTAL KNEE REVISION;  Surgeon: Dione Plover Aluisio;  [facility];  Service: Orthopedics;  Laterality: Right;    FAMILY HISTORY: Family History  Problem Relation Age of Onset  . Diverticulitis Mother   . Alzheimer's disease Mother   . Colon cancer Maternal Grandmother   . Heart attack Father   . Breast cancer Daughter    Her father died at age 80 from heart failure. Her mother died at age 54 from  Alzheimer's. She has 1 sister and no brothers. Her maternal grandmother had colon cancer and she is unsure of what age she was diagnosed with colon cancer. Her father was diagnosed with skin cancer in his late 89's, and she notes that he didn't have melanoma. Her daughter had DCIS breast cancer diagnosed at 66.She hasn't had genetic testing completed as of yet. Her oldest daughter had genetic testing which came back normal. She has a granddaughter that is 47 years old.   GYNECOLOGIC HISTORY:  No LMP recorded. Patient has had a hysterectomy.  Menarche: 78 years old Age at first live birth: 78 years old GP: GxP3 LMP: Had a hysterectomy Contraceptive: OCP HRT: Yes, Estrogen and Progesterone combination for over 30 years.    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 She has 3 children from her first marriage: [name] age 15 who is Mudlogger of a Location manager in [place]. [name] is 21 in Sales in [place]. [name] is 32 and is a 5th grade teacher in [place].               ADVANCED DIRECTIVES: Her oldest daughter, [name] is the patient's [place] and can be reached at [phone].    HEALTH MAINTENANCE: Social History   Tobacco Use  .  Smoking status: Never Smoker  . Smokeless tobacco: Never Used  Substance Use Topics  . Alcohol use: Yes    Comment: rarely    Colonoscopy:  PAP:  Bone density: at [facility] on 12/10/2015 found a T score of -1.6  Current Outpatient Medications on File Prior to Visit  Medication Sig Dispense Refill  . Cholecalciferol (VITAMIN D-3) 5000 units TABS Take by mouth. 30 tablet   . enalapril (VASOTEC) 10 MG tablet Take 10 mg daily by mouth.    . escitalopram (LEXAPRO) 10 MG tablet Take 10 mg daily by mouth.    Marland Kitchen glimepiride (AMARYL) 1 MG tablet Take 1 mg by mouth 2 (two) times daily.    . hydrochlorothiazide (MICROZIDE) 12.5 MG  capsule Take 12.5 mg daily by mouth.    . Levothyroxine Sodium 100 MCG CAPS Take 100 mcg daily by mouth.   1  . rosuvastatin (CRESTOR) 5 MG tablet Take 1 tablet by mouth as directed. Monday Wednesday friday  0   No current facility-administered medications on file prior to visit.    OBJECTIVE: Middle-aged white woman   There were no vitals filed for this visit.  There were no vitals filed for this visit.There is no height or weight on file to calculate BMI.   STUDIES: US Renal  Result Date: 05/25/2018 CLINICAL DATA:  78 year old female with increasing creatinine EXAM: RENAL / URINARY TRACT ULTRASOUND COMPLETE COMPARISON:  CT 10/28/2016 FINDINGS: Right Kidney: Length: 10.0 cm x 4.8 cm x 4.6 cm, 115 cc. Echogenicity not significantly increased. No significant cortical thinning. No hydronephrosis. No hyperechoic foci identified. Anechoic focus with through transmission at the inferior cortex measures 10 mm x 10 mm x 9 mm, with no internal flow and most likely a simple cyst. Left Kidney: Length: 9.8 cm x 4.1 cm x 3.9 cm, 82 cc. Left kidney demonstrates renal cortical thinning. No hydronephrosis. Echogenic foci in the collecting system, with the largest measuring 8 mm with ring down. Compatible with prior CT study. Anechoic cystic lesion superior cortex measures 2.3 cm x 2.4 cm x 1.9 cm, compatible with prior CT. Bladder: Appears normal for degree of bladder distention. IMPRESSION: No evidence of hydronephrosis. Mild left-sided renal cortical thinning. Left-sided nephrolithiasis. Bilateral simple cysts. Electronically Signed   By: Corrie Mckusick D.O.   On: 05/25/2018 14:58   Mm Diag Breast Tomo Bilateral  Result Date: 06/13/2018 CLINICAL DATA:  History of LEFT breast cancer in 2018 status post breast conservation surgery and radiation therapy. EXAM: DIGITAL DIAGNOSTIC BILATERAL MAMMOGRAM WITH CAD AND TOMO COMPARISON:  Previous exam(s). ACR Breast Density Category c: The breast tissue is heterogeneously  dense, which may obscure small masses. FINDINGS: There are stable postsurgical changes within the LEFT breast. There are no new dominant masses, suspicious calcifications or secondary signs of malignancy within either breast. Mammographic images were processed with CAD. IMPRESSION: No evidence of malignancy within either breast. Stable postsurgical changes within the LEFT breast. RECOMMENDATION: Bilateral diagnostic mammogram in 1 year. I have discussed the findings and recommendations with the patient. Results were also provided in writing at the conclusion of the visit. If applicable, a reminder letter will be sent to the patient regarding the next appointment. BI-RADS CATEGORY  2: Benign. Electronically Signed   By: Franki Cabot M.D.   On: 06/13/2018 15:55     ELIGIBLE FOR AVAILABLE RESEARCH PROTOCOL: no   ASSESSMENT: 78 y.o. [place] woman s/p left breast upper outer quadrant biopsy 11/20/2016 for a clinical T1c N0, stage IA invasive  ductal carcinoma, grade 1, estrogen and progesterone receptor positive, HER-2 not amplified, with an Mib-1 of 15%  (1) s/p left lumpectomy with sentinel lymph node sampling 12/11/2016 for a pT1c pN0 invasive ductal carcinoma, grade 2, with negative margins  (2) oncotype score of 24 predicted a 10-year risk of recurrence outside the breast of 16% if the patient's only systemic therapy was tamoxifen for 5 years.  It also predicted no significant benefit from chemotherapy.  (3) adjuvant radiation 01/27/2017 - 02/23/2017 Site/dose:   The patient initially received a dose of 42.5 Gy in 17 fractions to the breast using whole-breast tangent fields. This was delivered using a 3-D conformal technique. The patient then received a boost to the seroma. This delivered an additional 7.5 Gy in 3 fractions using a 3 field photon technique due to the depth of the seroma. The total dose was 50 Gy.  (4) anastrozole started 03/16/2017  (a) bone density at [facility] 12/10/2015  found a T score of -1.6  (b) repeat bone density 12/15/2017 shows a T score of -1.6 (unchanged).   PLAN: [name] is now a year and a half out from definitive surgery for her breast cancer with no evidence of disease recurrence.  This is favorable.  She was tolerating anastrozole well and it is perplexing that she would not call us when the pharmacy told her that we had refused to refill her anastrozole.  There must have been some confusion somewhere.  At any rate I have refilled her prescription and the plan continues to be for a total of 5 years on this medication.  She just had a mammogram which was fine.  She had a bone density last November and will have 1 again in November 2021  I am going to see her the first week in December of this year, hopefully in person, and from then on I will start seeing her on a once a year basis  She knows to call for any other issues that may develop before her return visit.   Kameshia Madruga, Virgie Dad, MD  06/16/18 10:55 AM Medical Oncology and Hematology [facility] [address] [place], [place] [address] Tel. [phone]    Fax. [phone]   I, Wilburn Mylar, am acting as scribe for Dr. Virgie Dad. Huel Centola.  I, Lurline Del MD, have reviewed the above documentation for accuracy and completeness, and I agree with the above.

## 2018-06-16 ENCOUNTER — Inpatient Hospital Stay: Payer: PPO | Attending: Oncology | Admitting: Oncology

## 2018-06-16 ENCOUNTER — Other Ambulatory Visit: Payer: PPO

## 2018-06-16 DIAGNOSIS — Z170 Estrogen receptor positive status [ER+]: Secondary | ICD-10-CM | POA: Diagnosis not present

## 2018-06-16 DIAGNOSIS — Z79811 Long term (current) use of aromatase inhibitors: Secondary | ICD-10-CM

## 2018-06-16 DIAGNOSIS — I10 Essential (primary) hypertension: Secondary | ICD-10-CM

## 2018-06-16 DIAGNOSIS — Z79899 Other long term (current) drug therapy: Secondary | ICD-10-CM | POA: Diagnosis not present

## 2018-06-16 DIAGNOSIS — E039 Hypothyroidism, unspecified: Secondary | ICD-10-CM | POA: Diagnosis not present

## 2018-06-16 DIAGNOSIS — C50412 Malignant neoplasm of upper-outer quadrant of left female breast: Secondary | ICD-10-CM

## 2018-06-16 MED ORDER — ANASTROZOLE 1 MG PO TABS: 1.0000 mg | ORAL_TABLET | Freq: Every day | ORAL | 4 refills | Status: DC

## 2018-06-17 DIAGNOSIS — I10 Essential (primary) hypertension: Secondary | ICD-10-CM | POA: Diagnosis not present

## 2018-06-17 DIAGNOSIS — F331 Major depressive disorder, recurrent, moderate: Secondary | ICD-10-CM | POA: Diagnosis not present

## 2018-06-17 DIAGNOSIS — M1990 Unspecified osteoarthritis, unspecified site: Secondary | ICD-10-CM | POA: Diagnosis not present

## 2018-06-17 DIAGNOSIS — M79672 Pain in left foot: Secondary | ICD-10-CM | POA: Diagnosis not present

## 2018-06-17 DIAGNOSIS — E119 Type 2 diabetes mellitus without complications: Secondary | ICD-10-CM | POA: Diagnosis not present

## 2018-06-17 DIAGNOSIS — E039 Hypothyroidism, unspecified: Secondary | ICD-10-CM | POA: Diagnosis not present

## 2018-06-17 DIAGNOSIS — Z853 Personal history of malignant neoplasm of breast: Secondary | ICD-10-CM | POA: Diagnosis not present

## 2018-06-17 DIAGNOSIS — E1165 Type 2 diabetes mellitus with hyperglycemia: Secondary | ICD-10-CM | POA: Diagnosis not present

## 2018-06-17 DIAGNOSIS — N183 Chronic kidney disease, stage 3 (moderate): Secondary | ICD-10-CM | POA: Diagnosis not present

## 2018-06-17 DIAGNOSIS — M109 Gout, unspecified: Secondary | ICD-10-CM | POA: Diagnosis not present

## 2018-07-04 DIAGNOSIS — M48061 Spinal stenosis, lumbar region without neurogenic claudication: Secondary | ICD-10-CM | POA: Diagnosis not present

## 2018-07-05 ENCOUNTER — Telehealth: Payer: Self-pay | Admitting: Neurology

## 2018-07-05 NOTE — Progress Notes (Signed)
Virtual Visit via Telephone Note The purpose of this virtual visit is to provide medical care while limiting exposure to the novel coronavirus.    Consent was obtained for phone visit:  Yes.   Answered questions that patient had about telehealth interaction:  Yes.   I discussed the limitations, risks, security and privacy concerns of performing an evaluation and management service by telephone. I also discussed with the patient that there may be a patient responsible charge related to this service. The patient expressed understanding and agreed to proceed.  Pt location: Home Physician Location: home Name of referring provider:  No ref. provider found I connected with .[name] at patients initiation/request on 07/08/2018 at  9:30 AM EDT by telephone and verified that I am speaking with the correct person using two identifiers.  Pt MRN:  [id number] Pt DOB:  Mar 31, 1940   History of Present Illness:  Patient is seen today in follow-up for tremor.  I saw her in March for tremor and suspected that she had essential tremor, but her TSH was quite low at 0.18, and I told her she really needed to get this straightened out as her Synthroid could need adjusting and this could be contributing to tremor, before we started tremor medication.  The patient was agreeable to that.  I did call her primary care for her last notes, which were dated Jun 13, 2018.  There was nothing about her thyroid disease.  She was seen at that point for hypertension, chronic kidney disease and diabetes.  However, her TSH has been rechecked since that time and has normalized and pt states that her dosing of meds has been adjusted.  Despite the changes in that, she continues to have tremor.  "I'm really bothered by it, emotionally I guess."  States that it is interfering with cooking and pouring liquids.    Current Outpatient Medications on File Prior to Visit  Medication Sig Dispense Refill  . anastrozole (ARIMIDEX) 1 MG  tablet Take 1 tablet (1 mg total) by mouth daily. 90 tablet 4  . Cholecalciferol (VITAMIN D-3) 5000 units TABS Take by mouth. 30 tablet   . enalapril (VASOTEC) 10 MG tablet Take 10 mg daily by mouth.    . escitalopram (LEXAPRO) 10 MG tablet Take 10 mg daily by mouth.    Marland Kitchen glimepiride (AMARYL) 1 MG tablet Take 1 mg by mouth 2 (two) times daily.    . hydrochlorothiazide (MICROZIDE) 12.5 MG capsule Take 12.5 mg daily by mouth.    . Levothyroxine Sodium 100 MCG CAPS Take 100 mcg daily by mouth.   1  . rosuvastatin (CRESTOR) 5 MG tablet Take 1 tablet by mouth as directed. Monday Wednesday friday  0   No current facility-administered medications on file prior to visit.       Observations/Objective:   Vitals:   07/08/18 0811  BP: (!) 142/68  Weight: 183 lb (83 kg)  Height: 5' 2.5" (1.588 m)     Lab work has been reviewed from primary care.  Lab work was dated May 27, 2018.  Sodium was 136, potassium 3.6, chloride 95, CO2 27, BUN 18, creatinine 1.46 (improved compared to April, 2020 in which creatinine was 1.8).  Glucose is 144.  AST 12, ALT 10, alkaline phosphatase 83.  TSH was done in May 19, 2018 and was 1.71.    Assessment and Plan:   1.  Tremor  -Patient reports Synthroid has been adjusted and she continues to have tremor.  -  talked about various medications and we decided on primidone.  Start primidone - 50 mg - 1/2 tablet at night for 4 nights and then 1 tablet at night thereafter.  Risks, benefits, side effects and alternative therapies were discussed.  The opportunity to ask questions was given and they were answered to the best of my ability.  The patient expressed understanding and willingness to follow the outlined treatment protocols.  -pt instructed to call me in in 4-6 weeks as may need to increase dosage.    Follow Up Instructions: 5-6 months   -I discussed the assessment and treatment plan with the patient. The patient was provided an opportunity to ask questions and all  were answered. The patient agreed with the plan and demonstrated an understanding of the instructions.   The patient was advised to call back or seek an in-person evaluation if the symptoms worsen or if the condition fails to improve as anticipated.    Total Time spent in visit with the patient was:  12 min, of which 100% of the time was spent in counseling re med safety.   Pt understands and agrees with the plan of care outlined.     Alonza Bogus, DO

## 2018-07-05 NOTE — Telephone Encounter (Signed)
Patient left msg with after hours and stated she is still having tremors. Thanks!

## 2018-07-05 NOTE — Telephone Encounter (Signed)
Pt is sch for a telephone visit on 07-08-18 she can not do a video visit

## 2018-07-05 NOTE — Telephone Encounter (Signed)
Called spoke with patient she states she was informed that their is a tremor medication that is not related to parkinson that she can take for tremors. Pt would like to know is you are ok with prescribing that medication. She states that it was discussed during her last visit.   [facility]

## 2018-07-05 NOTE — Telephone Encounter (Signed)
Hinton Dyer, make pt evisit to discuss

## 2018-07-06 DIAGNOSIS — N184 Chronic kidney disease, stage 4 (severe): Secondary | ICD-10-CM | POA: Diagnosis not present

## 2018-07-06 DIAGNOSIS — N183 Chronic kidney disease, stage 3 (moderate): Secondary | ICD-10-CM | POA: Diagnosis not present

## 2018-07-06 DIAGNOSIS — I129 Hypertensive chronic kidney disease with stage 1 through stage 4 chronic kidney disease, or unspecified chronic kidney disease: Secondary | ICD-10-CM | POA: Diagnosis not present

## 2018-07-06 DIAGNOSIS — N189 Chronic kidney disease, unspecified: Secondary | ICD-10-CM | POA: Diagnosis not present

## 2018-07-06 DIAGNOSIS — F329 Major depressive disorder, single episode, unspecified: Secondary | ICD-10-CM | POA: Diagnosis not present

## 2018-07-06 DIAGNOSIS — F419 Anxiety disorder, unspecified: Secondary | ICD-10-CM | POA: Diagnosis not present

## 2018-07-06 DIAGNOSIS — Z170 Estrogen receptor positive status [ER+]: Secondary | ICD-10-CM | POA: Diagnosis not present

## 2018-07-06 DIAGNOSIS — D631 Anemia in chronic kidney disease: Secondary | ICD-10-CM | POA: Diagnosis not present

## 2018-07-06 DIAGNOSIS — E785 Hyperlipidemia, unspecified: Secondary | ICD-10-CM | POA: Diagnosis not present

## 2018-07-06 DIAGNOSIS — C50919 Malignant neoplasm of unspecified site of unspecified female breast: Secondary | ICD-10-CM | POA: Diagnosis not present

## 2018-07-06 DIAGNOSIS — M170 Bilateral primary osteoarthritis of knee: Secondary | ICD-10-CM | POA: Diagnosis not present

## 2018-07-06 DIAGNOSIS — N2581 Secondary hyperparathyroidism of renal origin: Secondary | ICD-10-CM | POA: Diagnosis not present

## 2018-07-08 ENCOUNTER — Other Ambulatory Visit: Payer: Self-pay

## 2018-07-08 ENCOUNTER — Encounter: Payer: Self-pay | Admitting: Neurology

## 2018-07-08 ENCOUNTER — Telehealth (INDEPENDENT_AMBULATORY_CARE_PROVIDER_SITE_OTHER): Payer: PPO | Admitting: Neurology

## 2018-07-08 DIAGNOSIS — G250 Essential tremor: Secondary | ICD-10-CM

## 2018-07-08 MED ORDER — PRIMIDONE 50 MG PO TABS: 50.0000 mg | ORAL_TABLET | Freq: Every day | ORAL | 1 refills | Status: DC

## 2018-07-15 ENCOUNTER — Other Ambulatory Visit: Payer: Self-pay | Admitting: Physical Medicine and Rehabilitation

## 2018-07-18 ENCOUNTER — Other Ambulatory Visit: Payer: Self-pay | Admitting: Physical Medicine and Rehabilitation

## 2018-07-18 DIAGNOSIS — M48061 Spinal stenosis, lumbar region without neurogenic claudication: Secondary | ICD-10-CM

## 2018-08-02 ENCOUNTER — Other Ambulatory Visit: Payer: Self-pay

## 2018-08-02 ENCOUNTER — Ambulatory Visit
Admission: RE | Admit: 2018-08-02 | Discharge: 2018-08-02 | Disposition: A | Discharge status: Home / Self Care | Payer: PPO | Source: Ambulatory Visit | Attending: Physical Medicine and Rehabilitation | Admitting: Physical Medicine and Rehabilitation

## 2018-08-02 DIAGNOSIS — M48061 Spinal stenosis, lumbar region without neurogenic claudication: Secondary | ICD-10-CM

## 2018-08-02 DIAGNOSIS — M4727 Other spondylosis with radiculopathy, lumbosacral region: Secondary | ICD-10-CM | POA: Diagnosis not present

## 2018-08-02 MED ORDER — METHYLPREDNISOLONE ACETATE 40 MG/ML INJ SUSP (RADIOLOG
120.0000 mg | Freq: Once | INTRAMUSCULAR | Status: AC
  Administered 2018-08-02: 08:00:00 120 mg via EPIDURAL

## 2018-08-02 MED ORDER — IOPAMIDOL (ISOVUE-M 200) INJECTION 41%
1.0000 mL | Freq: Once | INTRAMUSCULAR | Status: AC
  Administered 2018-08-02: 1 mL via EPIDURAL

## 2018-08-02 NOTE — Discharge Instructions (Signed)

## 2018-09-12 DIAGNOSIS — R195 Other fecal abnormalities: Secondary | ICD-10-CM | POA: Diagnosis not present

## 2018-09-12 DIAGNOSIS — E559 Vitamin D deficiency, unspecified: Secondary | ICD-10-CM | POA: Diagnosis not present

## 2018-09-12 DIAGNOSIS — E039 Hypothyroidism, unspecified: Secondary | ICD-10-CM | POA: Diagnosis not present

## 2018-09-12 DIAGNOSIS — I129 Hypertensive chronic kidney disease with stage 1 through stage 4 chronic kidney disease, or unspecified chronic kidney disease: Secondary | ICD-10-CM | POA: Diagnosis not present

## 2018-09-12 DIAGNOSIS — E785 Hyperlipidemia, unspecified: Secondary | ICD-10-CM | POA: Diagnosis not present

## 2018-09-12 DIAGNOSIS — E1169 Type 2 diabetes mellitus with other specified complication: Secondary | ICD-10-CM | POA: Diagnosis not present

## 2018-09-12 DIAGNOSIS — C50412 Malignant neoplasm of upper-outer quadrant of left female breast: Secondary | ICD-10-CM | POA: Diagnosis not present

## 2018-09-12 DIAGNOSIS — N183 Chronic kidney disease, stage 3 (moderate): Secondary | ICD-10-CM | POA: Diagnosis not present

## 2018-09-19 DIAGNOSIS — E785 Hyperlipidemia, unspecified: Secondary | ICD-10-CM | POA: Diagnosis not present

## 2018-09-19 DIAGNOSIS — E559 Vitamin D deficiency, unspecified: Secondary | ICD-10-CM | POA: Diagnosis not present

## 2018-09-19 DIAGNOSIS — E1169 Type 2 diabetes mellitus with other specified complication: Secondary | ICD-10-CM | POA: Diagnosis not present

## 2018-09-19 DIAGNOSIS — E039 Hypothyroidism, unspecified: Secondary | ICD-10-CM | POA: Diagnosis not present

## 2018-09-20 DIAGNOSIS — R195 Other fecal abnormalities: Secondary | ICD-10-CM | POA: Diagnosis not present

## 2018-09-20 DIAGNOSIS — Z1211 Encounter for screening for malignant neoplasm of colon: Secondary | ICD-10-CM | POA: Diagnosis not present

## 2018-09-20 DIAGNOSIS — K5730 Diverticulosis of large intestine without perforation or abscess without bleeding: Secondary | ICD-10-CM | POA: Diagnosis not present

## 2018-09-20 DIAGNOSIS — Z800 Family history of malignant neoplasm of digestive organs: Secondary | ICD-10-CM | POA: Diagnosis not present

## 2018-09-20 DIAGNOSIS — E669 Obesity, unspecified: Secondary | ICD-10-CM | POA: Diagnosis not present

## 2018-10-06 DIAGNOSIS — N183 Chronic kidney disease, stage 3 (moderate): Secondary | ICD-10-CM | POA: Diagnosis not present

## 2018-10-06 DIAGNOSIS — C50919 Malignant neoplasm of unspecified site of unspecified female breast: Secondary | ICD-10-CM | POA: Diagnosis not present

## 2018-10-06 DIAGNOSIS — N2581 Secondary hyperparathyroidism of renal origin: Secondary | ICD-10-CM | POA: Diagnosis not present

## 2018-10-06 DIAGNOSIS — E785 Hyperlipidemia, unspecified: Secondary | ICD-10-CM | POA: Diagnosis not present

## 2018-10-06 DIAGNOSIS — I129 Hypertensive chronic kidney disease with stage 1 through stage 4 chronic kidney disease, or unspecified chronic kidney disease: Secondary | ICD-10-CM | POA: Diagnosis not present

## 2018-10-06 DIAGNOSIS — D631 Anemia in chronic kidney disease: Secondary | ICD-10-CM | POA: Diagnosis not present

## 2018-10-06 DIAGNOSIS — F419 Anxiety disorder, unspecified: Secondary | ICD-10-CM | POA: Diagnosis not present

## 2018-10-06 DIAGNOSIS — F329 Major depressive disorder, single episode, unspecified: Secondary | ICD-10-CM | POA: Diagnosis not present

## 2018-10-06 DIAGNOSIS — Z170 Estrogen receptor positive status [ER+]: Secondary | ICD-10-CM | POA: Diagnosis not present

## 2018-10-10 DIAGNOSIS — K5730 Diverticulosis of large intestine without perforation or abscess without bleeding: Secondary | ICD-10-CM | POA: Diagnosis not present

## 2018-10-10 DIAGNOSIS — R195 Other fecal abnormalities: Secondary | ICD-10-CM | POA: Diagnosis not present

## 2018-10-10 DIAGNOSIS — K635 Polyp of colon: Secondary | ICD-10-CM | POA: Diagnosis not present

## 2018-10-10 DIAGNOSIS — D120 Benign neoplasm of cecum: Secondary | ICD-10-CM | POA: Diagnosis not present

## 2018-10-10 DIAGNOSIS — D122 Benign neoplasm of ascending colon: Secondary | ICD-10-CM | POA: Diagnosis not present

## 2018-10-10 DIAGNOSIS — Z1211 Encounter for screening for malignant neoplasm of colon: Secondary | ICD-10-CM | POA: Diagnosis not present

## 2018-10-19 DIAGNOSIS — N183 Chronic kidney disease, stage 3 (moderate): Secondary | ICD-10-CM | POA: Diagnosis not present

## 2018-10-19 DIAGNOSIS — R000 Tachycardia, unspecified: Secondary | ICD-10-CM | POA: Diagnosis not present

## 2018-10-19 DIAGNOSIS — I129 Hypertensive chronic kidney disease with stage 1 through stage 4 chronic kidney disease, or unspecified chronic kidney disease: Secondary | ICD-10-CM | POA: Diagnosis not present

## 2018-10-19 DIAGNOSIS — R197 Diarrhea, unspecified: Secondary | ICD-10-CM | POA: Diagnosis not present

## 2018-10-20 ENCOUNTER — Other Ambulatory Visit: Payer: Self-pay

## 2018-10-20 DIAGNOSIS — Z20822 Contact with and (suspected) exposure to covid-19: Secondary | ICD-10-CM

## 2018-10-20 DIAGNOSIS — R6889 Other general symptoms and signs: Secondary | ICD-10-CM | POA: Diagnosis not present

## 2018-10-21 LAB — NOVEL CORONAVIRUS, NAA: SARS-CoV-2, NAA: NOT DETECTED

## 2018-11-03 DIAGNOSIS — E785 Hyperlipidemia, unspecified: Secondary | ICD-10-CM | POA: Diagnosis not present

## 2018-11-03 DIAGNOSIS — R509 Fever, unspecified: Secondary | ICD-10-CM | POA: Diagnosis not present

## 2018-11-03 DIAGNOSIS — R110 Nausea: Secondary | ICD-10-CM | POA: Diagnosis not present

## 2018-11-03 DIAGNOSIS — R5383 Other fatigue: Secondary | ICD-10-CM | POA: Diagnosis not present

## 2018-11-03 DIAGNOSIS — E1169 Type 2 diabetes mellitus with other specified complication: Secondary | ICD-10-CM | POA: Diagnosis not present

## 2018-11-03 DIAGNOSIS — R000 Tachycardia, unspecified: Secondary | ICD-10-CM | POA: Diagnosis not present

## 2018-11-04 ENCOUNTER — Other Ambulatory Visit: Payer: PPO

## 2018-11-04 ENCOUNTER — Other Ambulatory Visit: Payer: Self-pay | Admitting: Family Medicine

## 2018-11-04 ENCOUNTER — Ambulatory Visit
Admission: RE | Admit: 2018-11-04 | Discharge: 2018-11-04 | Disposition: A | Discharge status: Home / Self Care | Payer: PPO | Source: Ambulatory Visit | Attending: Family Medicine | Admitting: Family Medicine

## 2018-11-04 DIAGNOSIS — R1084 Generalized abdominal pain: Secondary | ICD-10-CM

## 2018-11-04 DIAGNOSIS — R509 Fever, unspecified: Secondary | ICD-10-CM

## 2018-11-04 DIAGNOSIS — K5730 Diverticulosis of large intestine without perforation or abscess without bleeding: Secondary | ICD-10-CM | POA: Diagnosis not present

## 2018-11-21 DIAGNOSIS — N3000 Acute cystitis without hematuria: Secondary | ICD-10-CM | POA: Diagnosis not present

## 2018-11-21 DIAGNOSIS — R300 Dysuria: Secondary | ICD-10-CM | POA: Diagnosis not present

## 2018-11-21 IMAGING — CT CT ANGIO CHEST
2 of 9 series · 19 of 36 positions shown · IV contrast (iopamidol)
Comparison: Chest x-ray dated 11/15/2017 and the CT scan dated
03/28/2013

CLINICAL DATA: Chest pain and shortness of breath. Positive
D-dimer. History of breast cancer.

EXAM:
CT ANGIOGRAPHY CHEST WITH CONTRAST
TECHNIQUE: Multidetector CT imaging of the chest was performed using the
standard protocol during bolus administration of intravenous
contrast. Multiplanar CT image reconstructions and MIPs were
obtained to evaluate the vascular anatomy.
CONTRAST:  80mL [id number] IOPAMIDOL ([id number]) INJECTION 76%

[Series 7: pe thins · axial · 0.64mm/px · z∈[-344,-90]mm · 18 of 287 slices shown]
[im 16/287  lung]
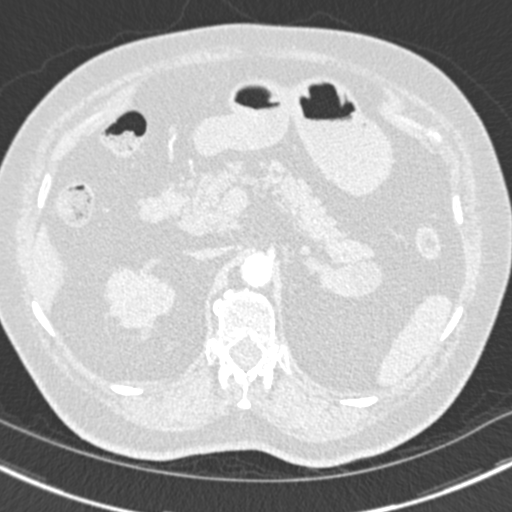
[im 31/287  mediastinal]
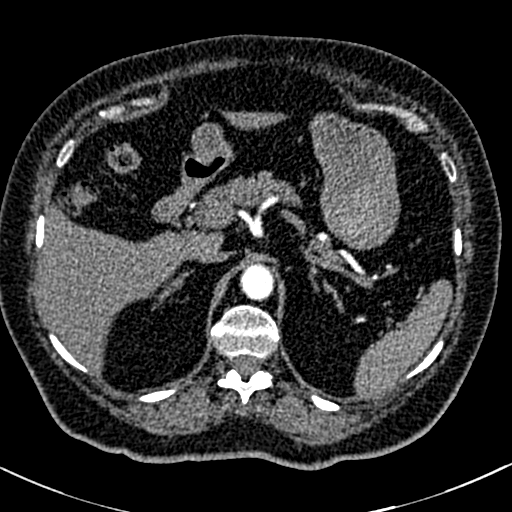
[im 46/287  lung]
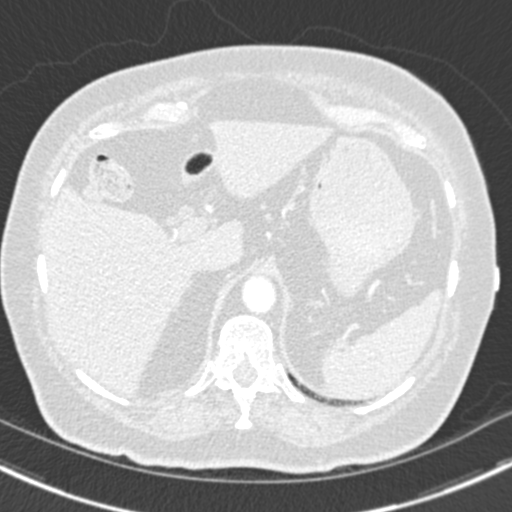
[im 61/287  mediastinal]
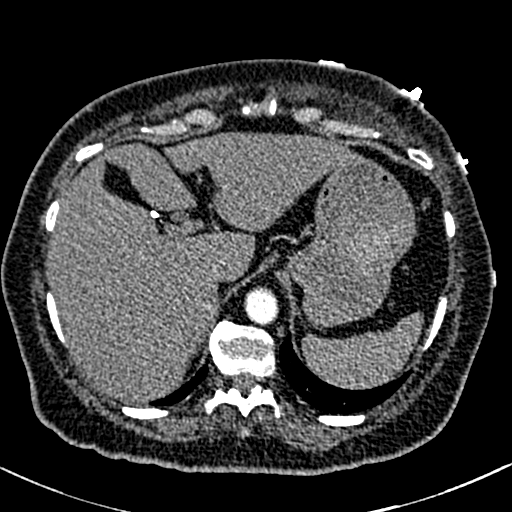
[im 76/287  lung]
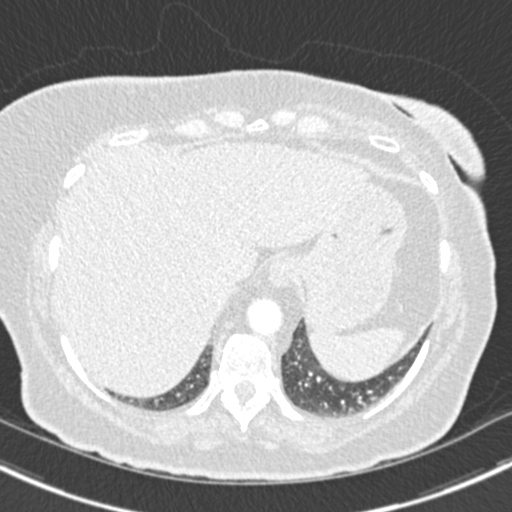
[im 91/287  mediastinal]
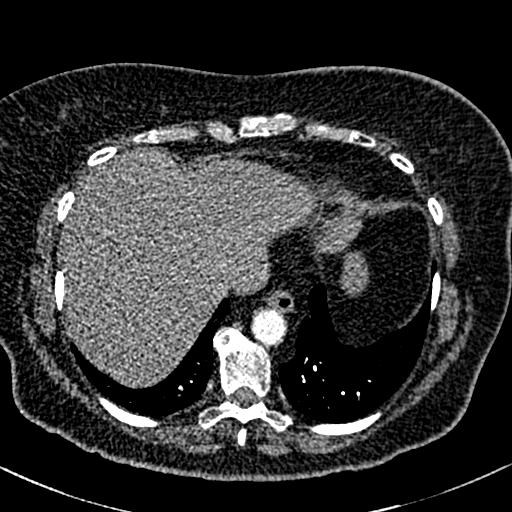
[im 106/287  lung]
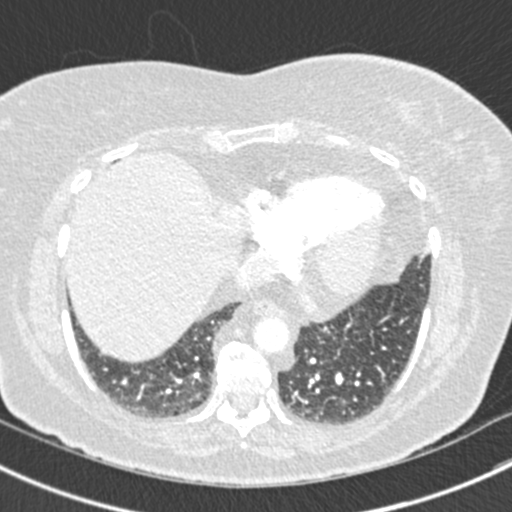
[im 121/287  mediastinal]
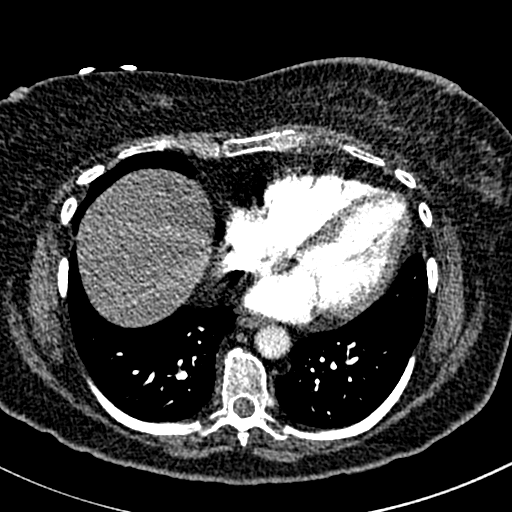
[im 136/287  lung]
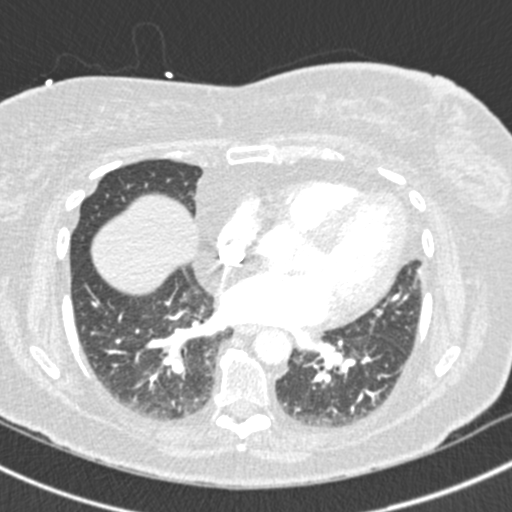
[im 151/287  mediastinal]
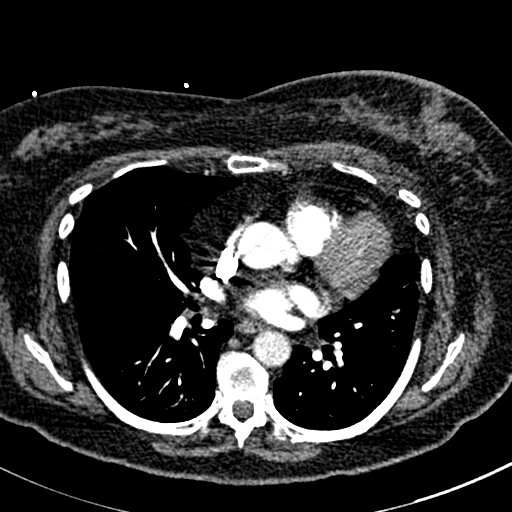
[im 166/287  lung]
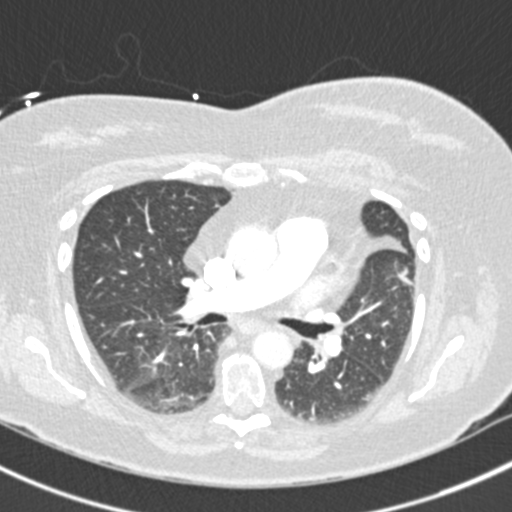
[im 181/287  mediastinal]
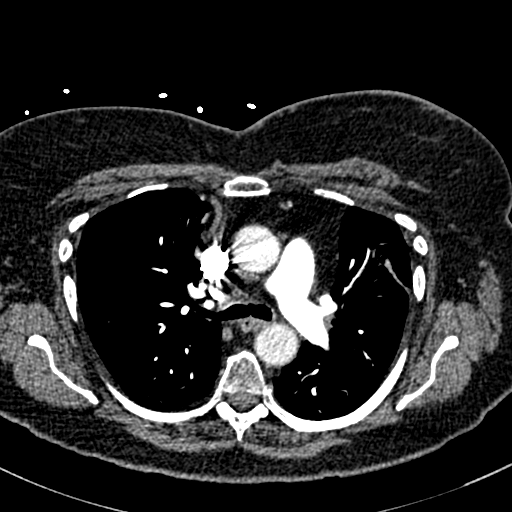
[im 196/287  lung]
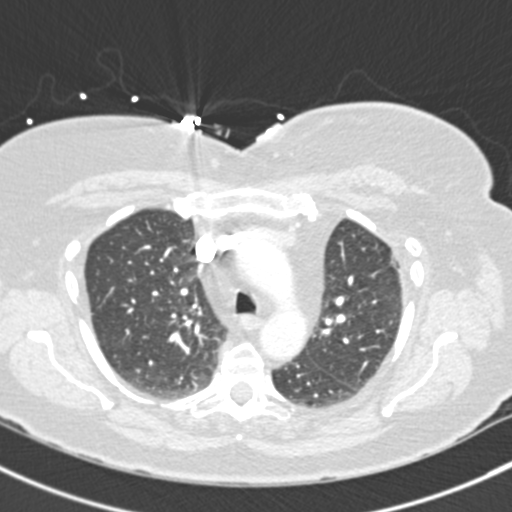
[im 211/287  mediastinal]
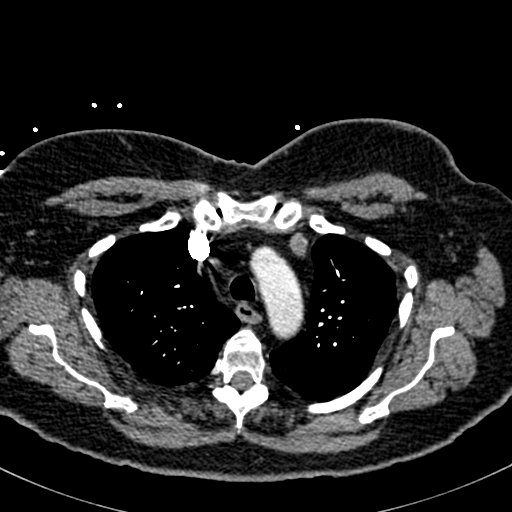
[im 226/287  lung]
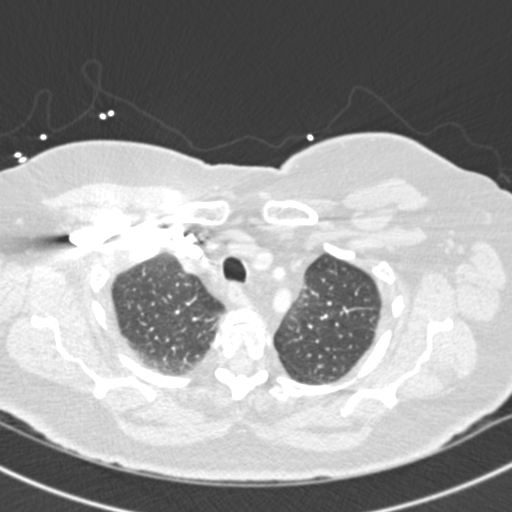
[im 241/287  mediastinal]
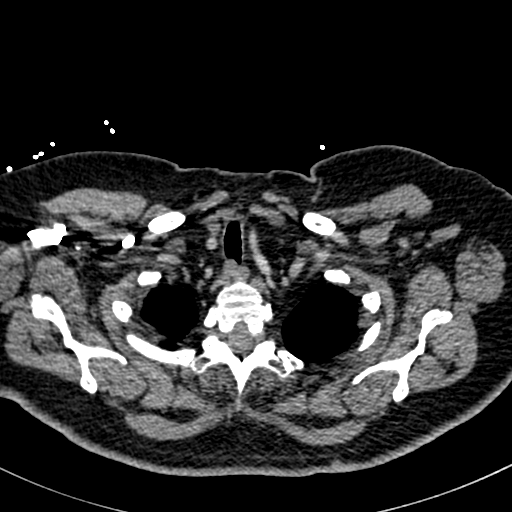
[im 256/287  lung]
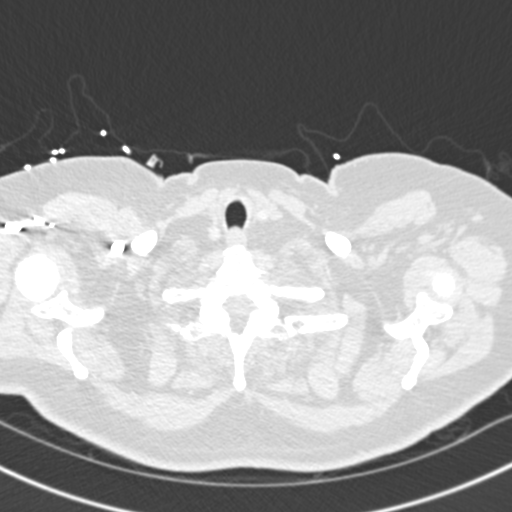
[im 271/287  mediastinal]
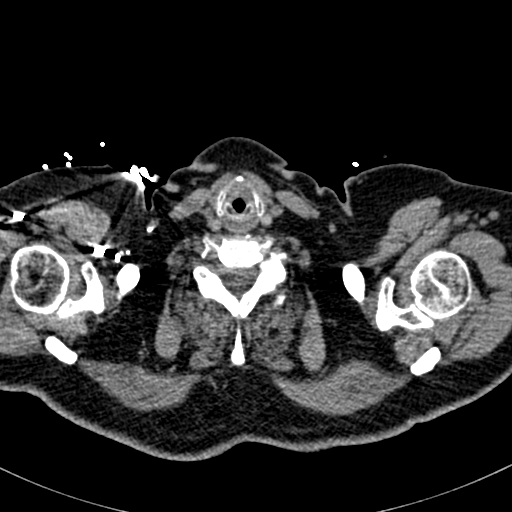

[Series 8: pe coronal mpr · coronal · 0.57mm/px · 1 of 145 slices shown]
[im 73/145  mediastinal]
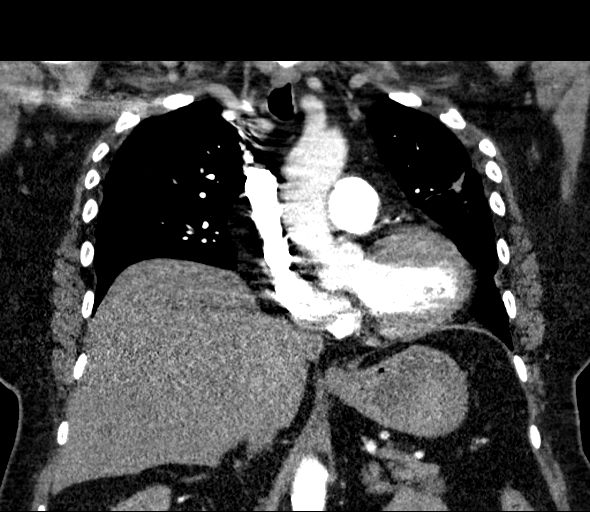

[19 of 36 positions shown; findings below may reference images not displayed]

FINDINGS: Cardiovascular: Satisfactory opacification of the pulmonary arteries
to the segmental level. No evidence of pulmonary embolism. Normal
heart size. No pericardial effusion.

Mediastinum/Nodes: No enlarged mediastinal, hilar, or axillary lymph
nodes. Thyroid gland, trachea, and esophagus demonstrate no
significant findings.

Lungs/Pleura: Minimal atelectasis in the periphery of the left upper
lobe. No infiltrates or effusions.

Upper Abdomen: No acute abnormality.

Musculoskeletal: No acute abnormality. Slight accentuation of the
thoracic kyphosis. Osteophytes fuse much of the thoracic spine.

Review of the MIP images confirms the above findings.
IMPRESSION: 1. No pulmonary emboli.
2. Minimal atelectasis in the left upper lobe.

## 2018-11-23 DIAGNOSIS — R509 Fever, unspecified: Secondary | ICD-10-CM | POA: Diagnosis not present

## 2018-12-05 ENCOUNTER — Other Ambulatory Visit (HOSPITAL_COMMUNITY): Payer: Self-pay | Admitting: Urology

## 2018-12-05 ENCOUNTER — Other Ambulatory Visit: Payer: Self-pay | Admitting: Urology

## 2018-12-05 DIAGNOSIS — N3000 Acute cystitis without hematuria: Secondary | ICD-10-CM | POA: Diagnosis not present

## 2018-12-05 DIAGNOSIS — R3982 Chronic bladder pain: Secondary | ICD-10-CM | POA: Diagnosis not present

## 2018-12-05 DIAGNOSIS — R109 Unspecified abdominal pain: Secondary | ICD-10-CM

## 2018-12-05 DIAGNOSIS — Z87442 Personal history of urinary calculi: Secondary | ICD-10-CM | POA: Diagnosis not present

## 2018-12-05 DIAGNOSIS — R1084 Generalized abdominal pain: Secondary | ICD-10-CM

## 2018-12-06 ENCOUNTER — Other Ambulatory Visit: Payer: Self-pay | Admitting: Gastroenterology

## 2018-12-06 ENCOUNTER — Other Ambulatory Visit (HOSPITAL_COMMUNITY): Payer: Self-pay | Admitting: Gastroenterology

## 2018-12-06 DIAGNOSIS — K5730 Diverticulosis of large intestine without perforation or abscess without bleeding: Secondary | ICD-10-CM | POA: Diagnosis not present

## 2018-12-06 DIAGNOSIS — R110 Nausea: Secondary | ICD-10-CM

## 2018-12-06 DIAGNOSIS — Z86010 Personal history of colonic polyps: Secondary | ICD-10-CM | POA: Diagnosis not present

## 2018-12-06 DIAGNOSIS — R194 Change in bowel habit: Secondary | ICD-10-CM | POA: Diagnosis not present

## 2018-12-07 ENCOUNTER — Other Ambulatory Visit: Payer: Self-pay

## 2018-12-07 ENCOUNTER — Ambulatory Visit (HOSPITAL_COMMUNITY)
Admission: RE | Admit: 2018-12-07 | Discharge: 2018-12-07 | Disposition: A | Discharge status: Home / Self Care | Payer: PPO | Source: Ambulatory Visit | Attending: Urology | Admitting: Urology

## 2018-12-07 DIAGNOSIS — R109 Unspecified abdominal pain: Secondary | ICD-10-CM

## 2018-12-07 DIAGNOSIS — R1084 Generalized abdominal pain: Secondary | ICD-10-CM | POA: Insufficient documentation

## 2018-12-07 DIAGNOSIS — N200 Calculus of kidney: Secondary | ICD-10-CM | POA: Diagnosis not present

## 2018-12-08 NOTE — Progress Notes (Signed)
Virtual Visit via Telephone Note The purpose of this virtual visit is to provide medical care while limiting exposure to the novel coronavirus.    Consent was obtained for phone visit:  yes Answered questions that patient had about telehealth interaction:  Yes.   I discussed the limitations, risks, security and privacy concerns of performing an evaluation and management service by telephone. I also discussed with the patient that there may be a patient responsible charge related to this service. The patient expressed understanding and agreed to proceed.  Pt location: Home Physician Location: home Name of referring provider:  [name], MD I connected with .[name] at patients initiation/request on 12/09/2018 at  2:00 PM EST by telephone and verified that I am speaking with the correct person using two identifiers.  Pt MRN:  [id number] Pt DOB:  Nov 16, 1940    History of Present Illness:  Patient seen today for tremor.  We started her on primidone, 50 mg at bedtime last visit.  She was supposed to call me 4 to 6 weeks after we did that to report how she was doing, as I thought she might need an increased dose.  I did not hear from her after that.  She reports today that she was doing better but then I thought I felt my body jerking recently and I wondered if it was from this medication and so I stopped the primidone a week ago."  She is on her 3rd round of abx for "a strep infection in my bladder."  Has had nausea with eating and "I had two CT scans and they dont find anything so my gastroenterologist is going to send me for another study."   Current Outpatient Medications:  .  anastrozole (ARIMIDEX) 1 MG tablet, Take 1 tablet (1 mg total) by mouth daily., Disp: 90 tablet, Rfl: 4 .  Cholecalciferol (VITAMIN D-3) 5000 units TABS, Take by mouth., Disp: 30 tablet, Rfl:  .  enalapril (VASOTEC) 10 MG tablet, Take 10 mg daily by mouth., Disp: , Rfl:  .  escitalopram (LEXAPRO) 10 MG  tablet, Take 10 mg daily by mouth., Disp: , Rfl:  .  glimepiride (AMARYL) 1 MG tablet, Take 1 mg by mouth 2 (two) times daily., Disp: , Rfl:  .  hydrochlorothiazide (MICROZIDE) 12.5 MG capsule, Take 12.5 mg daily by mouth., Disp: , Rfl:  .  Levothyroxine Sodium 100 MCG CAPS, Take 100 mcg daily by mouth. , Disp: , Rfl: 1 .  rosuvastatin (CRESTOR) 5 MG tablet, Take 1 tablet by mouth as directed. Monday Wednesday friday, Disp: , Rfl: 0    Observations/Objective:   There were no vitals filed for this visit.    Chemistry      Component Value Date/Time   NA 140 02/01/2018 1029   NA 140 12/02/2016 1213   K 3.5 02/01/2018 1029   K 3.8 12/02/2016 1213   CL 101 02/01/2018 1029   CO2 26 02/01/2018 1029   CO2 24 12/02/2016 1213   BUN 18 02/01/2018 1029   BUN 18.8 12/02/2016 1213   CREATININE 1.23 (H) 02/01/2018 1029   CREATININE 1.1 12/02/2016 1213      Component Value Date/Time   CALCIUM 9.5 02/01/2018 1029   CALCIUM 9.9 12/02/2016 1213   ALKPHOS 99 02/01/2018 1029   ALKPHOS 89 12/02/2016 1213   AST 14 (L) 02/01/2018 1029   AST 15 12/02/2016 1213   ALT 18 02/01/2018 1029   ALT 16 12/02/2016 1213   BILITOT 0.5  02/01/2018 1029   BILITOT 0.46 12/02/2016 1213       Assessment and Plan:   1.  Tremor  -She was improved with primidone, but stopped it a week ago.  It sounds like perhaps she was having some myoclonic jerks, and wondered if it was from the primidone.  My bet is that this was her renal insufficiency, but I do not have recent labs.  She states that she has been struggling with multiple urinary tract infections as well as nausea, for which she has been seeing gastroenterology.  For now, we decided to stay off of the primidone.  Once she gets these other issues worked out, she can call me back to make a follow-up appointment and we can decide whether or not we should go back on primidone.  She was agreeable to this approach.  She will follow-up on an as-needed basis.  Follow Up  Instructions:    -I discussed the assessment and treatment plan with the patient. The patient was provided an opportunity to ask questions and all were answered. The patient agreed with the plan and demonstrated an understanding of the instructions.   The patient was advised to call back or seek an in-person evaluation if the symptoms worsen or if the condition fails to improve as anticipated.    Total Time spent in visit with the patient was:  8 min, of which 100% of the time was spent in counseling .   Pt understands and agrees with the plan of care outlined.     Alonza Bogus, DO

## 2018-12-09 ENCOUNTER — Other Ambulatory Visit: Payer: Self-pay

## 2018-12-09 ENCOUNTER — Telehealth (INDEPENDENT_AMBULATORY_CARE_PROVIDER_SITE_OTHER): Payer: PPO | Admitting: Neurology

## 2018-12-09 DIAGNOSIS — G253 Myoclonus: Secondary | ICD-10-CM

## 2018-12-09 DIAGNOSIS — R251 Tremor, unspecified: Secondary | ICD-10-CM | POA: Diagnosis not present

## 2018-12-20 ENCOUNTER — Encounter (HOSPITAL_COMMUNITY): Payer: Self-pay

## 2018-12-20 ENCOUNTER — Encounter (HOSPITAL_COMMUNITY): Payer: PPO

## 2018-12-26 NOTE — Progress Notes (Signed)
[place]  [phone] [phone]   NB: Patient did not show for he 03/15/2017 appt  ID: [name] DOB: 07/02/1940  MR#: [id number]  YKD#:[id number]  Patient Care Team: [name], MD as PCP - General (Family Medicine) Noreta Kue, Virgie Dad, MD as Consulting Physician (Oncology) Roseanne Kaufman, MD as Consulting Physician (Orthopedic Surgery) Juanita Craver, MD as Consulting Physician (Gastroenterology) Jari Pigg, MD as Consulting Physician (Dermatology) Kathie Rhodes, MD as Consulting Physician (Urology) Kyung Rudd, MD as Consulting Physician (Radiation Oncology) Rolm Bookbinder, MD as Consulting Physician (General Surgery) OTHER MD:  CHIEF COMPLAINT: estrogen receptor positive breast cancer  CURRENT TREATMENT: Anastrozole   INTERVAL HISTORY: [name] returns today for follow-up of of her estrogen receptor positive breast cancer.   She continues on anastrozole.  She tells me about a week ago she ran out of medication and when she went to the pharmacy they told her they did not have the prescription.  I think what they meant to say is that they did not have the drug and that they would have to order it, since I prescribed it for 3 months with 4 refills (8-year) last May when something similar occurred.  She is tolerating it well, with minimal hot flashes and no other symptoms that she is aware of.  [name]'s last bone density screening on 12/15/2017, showed a T-score of -1.6, which is considered osteopenic.   Since her last visit, she underwent injection consisting of a left L5 nerve root block and transforaminal epidural on 08/02/2018 under Dr. Nelva Bush for lumbosacral spondylosis.  She presented to her PCP with abdominal pain. She proceeded to abdomen/pelvis CT on 11/04/2018, which showed: no acute abnormalities; severe left renal atrophy, new since prior in 10/2016; sigmoid diverticulosis.  She underwent another abdomen/pelvis CT on  12/07/2018, ordered by Dr. Lovena Neighbours in urology for nausea, nephrolithiasis, fevers, UTI, and chronic kidney disease. This showed: bilateral nephrolithiasis; no evidence of ureteral calculi, hydronephrosis, or other acute findings; colonic diverticulosis.  Her most recent mammography was at [facility] in 05/2018.  Breast density was category C.   REVIEW OF SYSTEMS: [name] has had repeated urinary tract infections which have not cleared despite 3 rounds of antibiotics.  She is scheduled to see her urologist APN later this week.  She is still having some burning with urination.  She has had no fever.  She had a negative Covid test recently.  Her husband does work outside the home but she says he is very careful and is monitored closely his job.  Aside from these issues no review of systems today was stable   HISTORY OF CURRENT ILLNESS:  From the original intake note:  [name] initially palpated a lump to her left breast while watching TV which she brought to medical attention and was evaluated 1 week following by her PCP, Dr. Coralyn Mark. She then had an unilateral diagnostic left mammography with tomography and CAD and left breast ultrasonography at [facility] on 11/17/2016 showing a 1.6 cm mass in the upper left breast suspicous for malignancy. The left axilla was benign.  Accordingly on 11/20/2016, she proceeded to biopsy of the left breast area in question. The pathology from this procedure showed ([id number]): invasive mammary carcinoma. Prognostic indicators: ER: 100% positive; PR: 5% positive; Both with strong staining intensity. Proliferation marker Ki67: 15% ; HER-2: negative.  The patient's subsequent history is as detailed below.   PAST MEDICAL HISTORY: Past Medical History:  Diagnosis Date  . Anxiety   .  Arthritis    knees  . Cancer ([place]) 11/2016   Left breast cancer  . Depression   . Diabetes mellitus without complication ([place])   . Diverticulitis   . Diverticulosis     bleeding  . GERD (gastroesophageal reflux disease)   . Hypertension   . Hyperthyroidism    nodule on thyroid, Radioactive, now hypo  . Hypothyroidism, iatrogenic    After RI now hypo on synthroid  . Personal history of radiation therapy     PAST SURGICAL HISTORY: Past Surgical History:  Procedure Laterality Date  . ABDOMINAL HYSTERECTOMY  1977  . BLADDER SURGERY     bladder suspension  . BREAST EXCISIONAL BIOPSY Right   . BREAST LUMPECTOMY Left 2018  . BREAST LUMPECTOMY WITH RADIOACTIVE SEED AND SENTINEL LYMPH NODE BIOPSY Left 12/11/2016   Procedure: BREAST LUMPECTOMY WITH RADIOACTIVE SEED AND SENTINEL LYMPH NODE BIOPSY;  Surgeon: Rolm Bookbinder, MD;  Location: [place] [facility];  Service: General;  Laterality: Left;  . CHOLECYSTECTOMY N/A 03/29/2013   Procedure: LAPAROSCOPIC CHOLECYSTECTOMY WITH INTRAOPERATIVE CHOLANGIOGRAM;  Surgeon: Edward Jolly, MD;  Location: [place];  Service: General;  Laterality: N/A;  . JOINT REPLACEMENT Right 2009   total knee replacement  . ROTATOR CUFF REPAIR Left   . TOTAL KNEE REVISION  12/17/2010   Procedure: TOTAL KNEE REVISION;  Surgeon: Dione Plover Aluisio;  Location: [facility];  Service: Orthopedics;  Laterality: Right;    FAMILY HISTORY: Family History  Problem Relation Age of Onset  . Diverticulitis Mother   . Alzheimer's disease Mother   . Colon cancer Maternal Grandmother   . Heart attack Father   . Breast cancer Daughter    Her father died at age 20 from heart failure. Her mother died at age 23 from Alzheimer's. She has 1 sister and no brothers. Her maternal grandmother had colon cancer and she is unsure of what age she was diagnosed with colon cancer. Her father was diagnosed with skin cancer in his late 56's, and she notes that he didn't have melanoma. Her daughter had DCIS breast cancer diagnosed at 28. She hasn't had genetic testing completed as of yet. Her oldest daughter had genetic testing which came back normal.     GYNECOLOGIC HISTORY:  No LMP recorded. Patient has had a hysterectomy.  Menarche: 78 years old Age at first live birth: 78 years old GP: GxP3 LMP: Had a hysterectomy Contraceptive: OCP HRT: Yes, Estrogen and Progesterone combination for over 30 years.    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 She has 3 children from her first marriage: [name] age 57 who is Mudlogger of a Location manager in [place]. [name] is 83 in Sales in [place]. [name] is 41 and is a 5th grade teacher in [place]. She has a granddaughter that is 52 years old.  She has 2 great-grandchildren and twins on the way (as of December 2020)              ADVANCED DIRECTIVES: Her oldest daughter, [name] is the [employer] of [place] and can be reached at [phone].    HEALTH MAINTENANCE: Social History   Tobacco Use  . Smoking status: Never Smoker  . Smokeless tobacco: Never Used  Substance Use Topics  . Alcohol use: Yes    Comment: rarely    Colonoscopy:  PAP:  Bone density: at [facility] on 12/10/2015 found a  T score of -1.6  Current Outpatient Medications on File Prior to Visit  Medication Sig Dispense Refill  . anastrozole (ARIMIDEX) 1 MG tablet Take 1 tablet (1 mg total) by mouth daily. 90 tablet 4  . Cholecalciferol (VITAMIN D-3) 5000 units TABS Take by mouth. 30 tablet   . enalapril (VASOTEC) 10 MG tablet Take 10 mg daily by mouth.    . escitalopram (LEXAPRO) 10 MG tablet Take 10 mg daily by mouth.    Marland Kitchen glimepiride (AMARYL) 1 MG tablet Take 1 mg by mouth 2 (two) times daily.    . hydrochlorothiazide (MICROZIDE) 12.5 MG capsule Take 12.5 mg daily by mouth.    . Levothyroxine Sodium 100 MCG CAPS Take 100 mcg daily by mouth.   1  . rosuvastatin (CRESTOR) 5 MG tablet Take 1 tablet by mouth as directed. Monday Wednesday friday  0   No current facility-administered medications on  file prior to visit.    OBJECTIVE: Middle-aged white woman in no acute distress  Vitals:   12/27/18 1130  BP: (!) 142/63  Pulse: 87  Resp: 18  Temp: 98.2 F (36.8 C)  SpO2: 99%    Filed Weights   12/27/18 1130  Weight: 171 lb 3.2 oz (77.7 kg)  Body mass index is 30.81 kg/m.  Sclerae unicteric, EOMs intact Wearing a mask No cervical or supraclavicular adenopathy Lungs no rales or rhonchi Heart regular rate and rhythm Abd soft, nontender, positive bowel sounds MSK no focal spinal tenderness, no upper extremity lymphedema Neuro: nonfocal, well oriented, appropriate affect Breasts: The right breast is unremarkable.  The left breast is status post lumpectomy and radiation.  There is no evidence of local recurrence.  Both axillae are benign.   STUDIES: Ct Abdomen Pelvis Wo Contrast  Result Date: 12/07/2018 CLINICAL DATA:  Nausea. Nephrolithiasis. Fevers. Urinary tract infection. Chronic kidney disease. EXAM: CT ABDOMEN AND PELVIS WITHOUT CONTRAST TECHNIQUE: Multidetector CT imaging of the abdomen and pelvis was performed following the standard protocol without IV contrast. COMPARISON:  11/04/2018 from [facility]: Lower chest: No acute findings. Hepatobiliary: No mass visualized on this unenhanced exam. Prior cholecystectomy. No evidence of biliary obstruction. Pancreas: No mass or inflammatory process visualized on this unenhanced exam. Spleen:  Within normal limits in size. Adrenals/Urinary tract: Multiple small less than 1 cm renal calculi are seen bilaterally. No evidence of ureteral calculi or hydronephrosis. Diffuse left renal atrophy is stable, as well as small cyst in upper pole. Mild right renal parenchymal scarring again noted. Unremarkable unopacified urinary bladder. Stomach/Bowel: No evidence of obstruction, inflammatory process, or abnormal fluid collections. Diverticulosis is seen mainly involving the sigmoid colon, however there is no evidence of  diverticulitis. Vascular/Lymphatic: No pathologically enlarged lymph nodes identified. No evidence of abdominal aortic aneurysm. Aortic atherosclerosis. Reproductive: Prior hysterectomy noted. Adnexal regions are unremarkable in appearance. Other:  None. Musculoskeletal:  No suspicious bone lesions identified. IMPRESSION: Bilateral nephrolithiasis. No evidence of ureteral calculi, hydronephrosis, or other acute findings. Colonic diverticulosis. No radiographic evidence of diverticulitis. Aortic Atherosclerosis (ICD10-I70.0). Electronically Signed   By: Marlaine Hind M.D.   On: 12/07/2018 17:02     ELIGIBLE FOR AVAILABLE RESEARCH PROTOCOL: no   ASSESSMENT: 78 y.o. [place] woman s/p left breast upper outer quadrant biopsy 11/20/2016 for a clinical T1c N0, stage IA invasive ductal carcinoma, grade 1, estrogen and progesterone receptor positive, HER-2 not amplified, with an Mib-1 of 15%  (1) s/p left lumpectomy with sentinel lymph node sampling 12/11/2016 for a pT1c pN0 invasive ductal carcinoma,  grade 2, with negative margins  (2) oncotype score of 24 predicted a 10-year risk of recurrence outside the breast of 16% if the patient's only systemic therapy was tamoxifen for 5 years.  It also predicted no significant benefit from chemotherapy.  (3) adjuvant radiation 01/27/2017 - 02/23/2017 Site/dose:   The patient initially received a dose of 42.5 Gy in 17 fractions to the breast using whole-breast tangent fields. This was delivered using a 3-D conformal technique. The patient then received a boost to the seroma. This delivered an additional 7.5 Gy in 3 fractions using a 3 field photon technique due to the depth of the seroma. The total dose was 50 Gy.  (4) anastrozole started 03/16/2017  (a) bone density at [facility] 12/10/2015 found a T score of -1.6  (b) repeat bone density 12/15/2017 shows a T score of -1.6 (unchanged).  (c) off anastrozole January 2020 through May 2020 (patient did not  refill prescription).   PLAN: [name] is now 2 years out from definitive surgery for her breast cancer with no evidence of disease recurrence.  This is very favorable.  She is tolerating the anastrozole well and the plan will be to continue that a minimum of 5 years  She will be due for a repeat bone density November of next year  We discussed the fact that her pharmacy apparently does not stock anastrozole.  When she finds that she only has a few pills left in the bottle she will need to alert her pharmacy so that she does not have breaks in her treatment.  At least this 1 was only a few days per her account  I do not know why she has a newly atrophic kidney.  She is working with urology regarding this.  She also continues to have symptoms of urinary urgency and burning despite having had 3 cycles of antibiotics.  She just completed a round of Cipro.  She will see urology later this week to discuss that further.  At this point no I feel comfortable from a breast cancer point of view and seeing her on a once a year basis  She is keeping appropriate pandemic precautions.  She knows to call for any other issues that may develop before her return visit here.   Kinlee Garrison, Virgie Dad, MD  12/27/18 12:01 PM Medical Oncology and Hematology [facility] [address] [place], [place] [address] Tel. [phone]    Fax. [phone]   I, Wilburn Mylar, am acting as scribe for Dr. Virgie Dad. Shalev Helminiak.  I, Lurline Del MD, have reviewed the above documentation for accuracy and completeness, and I agree with the above.  Addendum: Her potassium was less than 3.0 today.  She is on a diuretic but not on a potassium supplementation.  We called the potassium prescription in for her.

## 2018-12-27 ENCOUNTER — Inpatient Hospital Stay: Payer: PPO | Attending: Oncology

## 2018-12-27 ENCOUNTER — Inpatient Hospital Stay (HOSPITAL_BASED_OUTPATIENT_CLINIC_OR_DEPARTMENT_OTHER): Payer: PPO | Admitting: Oncology

## 2018-12-27 ENCOUNTER — Telehealth: Payer: Self-pay | Admitting: *Deleted

## 2018-12-27 ENCOUNTER — Telehealth: Payer: Self-pay

## 2018-12-27 ENCOUNTER — Other Ambulatory Visit: Payer: Self-pay

## 2018-12-27 VITALS — BP 142/63 | HR 87 | Temp 98.2°F | Resp 18 | Ht 62.5 in | Wt 171.2 lb

## 2018-12-27 DIAGNOSIS — E039 Hypothyroidism, unspecified: Secondary | ICD-10-CM | POA: Insufficient documentation

## 2018-12-27 DIAGNOSIS — C50412 Malignant neoplasm of upper-outer quadrant of left female breast: Secondary | ICD-10-CM | POA: Diagnosis not present

## 2018-12-27 DIAGNOSIS — Z170 Estrogen receptor positive status [ER+]: Secondary | ICD-10-CM | POA: Diagnosis not present

## 2018-12-27 DIAGNOSIS — N261 Atrophy of kidney (terminal): Secondary | ICD-10-CM | POA: Diagnosis not present

## 2018-12-27 DIAGNOSIS — N390 Urinary tract infection, site not specified: Secondary | ICD-10-CM | POA: Insufficient documentation

## 2018-12-27 DIAGNOSIS — Z90710 Acquired absence of both cervix and uterus: Secondary | ICD-10-CM | POA: Diagnosis not present

## 2018-12-27 DIAGNOSIS — C50912 Malignant neoplasm of unspecified site of left female breast: Secondary | ICD-10-CM | POA: Diagnosis not present

## 2018-12-27 DIAGNOSIS — Z79811 Long term (current) use of aromatase inhibitors: Secondary | ICD-10-CM | POA: Insufficient documentation

## 2018-12-27 DIAGNOSIS — M8580 Other specified disorders of bone density and structure, unspecified site: Secondary | ICD-10-CM | POA: Insufficient documentation

## 2018-12-27 LAB — CBC WITH DIFFERENTIAL/PLATELET
Abs Immature Granulocytes: 0.09 10*3/uL — ABNORMAL HIGH (ref 0.00–0.07)
Basophils Absolute: 0.1 10*3/uL (ref 0.0–0.1)
Basophils Relative: 1 %
Eosinophils Absolute: 0.2 10*3/uL (ref 0.0–0.5)
Eosinophils Relative: 2 %
HCT: 40 % (ref 36.0–46.0)
Hemoglobin: 12.8 g/dL (ref 12.0–15.0)
Immature Granulocytes: 1 %
Lymphocytes Relative: 14 %
Lymphs Abs: 1.4 10*3/uL (ref 0.7–4.0)
MCH: 28.2 pg (ref 26.0–34.0)
MCHC: 32 g/dL (ref 30.0–36.0)
MCV: 88.1 fL (ref 80.0–100.0)
Monocytes Absolute: 0.6 10*3/uL (ref 0.1–1.0)
Monocytes Relative: 6 %
Neutro Abs: 7.7 10*3/uL (ref 1.7–7.7)
Neutrophils Relative %: 76 %
Platelets: 254 10*3/uL (ref 150–400)
RBC: 4.54 MIL/uL (ref 3.87–5.11)
RDW: 14.3 % (ref 11.5–15.5)
WBC: 10.1 10*3/uL (ref 4.0–10.5)
nRBC: 0 % (ref 0.0–0.2)

## 2018-12-27 LAB — COMPREHENSIVE METABOLIC PANEL
ALT: 18 U/L (ref 0–44)
AST: 18 U/L (ref 15–41)
Albumin: 3.4 g/dL — ABNORMAL LOW (ref 3.5–5.0)
Alkaline Phosphatase: 97 U/L (ref 38–126)
Anion gap: 18 — ABNORMAL HIGH (ref 5–15)
BUN: 15 mg/dL (ref 8–23)
CO2: 26 mmol/L (ref 22–32)
Calcium: 9.5 mg/dL (ref 8.9–10.3)
Chloride: 92 mmol/L — ABNORMAL LOW (ref 98–111)
Creatinine, Ser: 1.55 mg/dL — ABNORMAL HIGH (ref 0.44–1.00)
GFR calc Af Amer: 37 mL/min — ABNORMAL LOW (ref >60)
GFR calc non Af Amer: 32 mL/min — ABNORMAL LOW (ref >60)
Glucose, Bld: 291 mg/dL — ABNORMAL HIGH (ref 70–99)
Potassium: 2.6 mmol/L — CL (ref 3.5–5.1)
Sodium: 136 mmol/L (ref 135–145)
Total Bilirubin: 0.4 mg/dL (ref 0.3–1.2)
Total Protein: 7.2 g/dL (ref 6.5–8.1)

## 2018-12-27 NOTE — Telephone Encounter (Signed)
Per Dr. Jana Hakim patient to start on Potassium 20 mEq daily for 90 days, and then have labs rechecked to see if potassium needs to be continued.    RN notified patient, patient voiced understanding and agreement.  Pt denies needing Rx, already has some from previous prescription.

## 2018-12-27 NOTE — Telephone Encounter (Signed)
Received call from the lab with K+ result of 2.6 Dr. Virgie Dad nurse Kathlee Nations and Dr. Jana Hakim made aware.

## 2018-12-28 ENCOUNTER — Telehealth: Payer: Self-pay | Admitting: Oncology

## 2018-12-28 NOTE — Telephone Encounter (Signed)
I talk with patient regarding schedule  

## 2019-01-02 DIAGNOSIS — M25511 Pain in right shoulder: Secondary | ICD-10-CM | POA: Diagnosis not present

## 2019-01-04 DIAGNOSIS — N3000 Acute cystitis without hematuria: Secondary | ICD-10-CM | POA: Diagnosis not present

## 2019-01-13 DIAGNOSIS — M170 Bilateral primary osteoarthritis of knee: Secondary | ICD-10-CM | POA: Diagnosis not present

## 2019-01-13 DIAGNOSIS — F329 Major depressive disorder, single episode, unspecified: Secondary | ICD-10-CM | POA: Diagnosis not present

## 2019-01-13 DIAGNOSIS — C50919 Malignant neoplasm of unspecified site of unspecified female breast: Secondary | ICD-10-CM | POA: Diagnosis not present

## 2019-01-13 DIAGNOSIS — N183 Chronic kidney disease, stage 3 unspecified: Secondary | ICD-10-CM | POA: Diagnosis not present

## 2019-01-13 DIAGNOSIS — I129 Hypertensive chronic kidney disease with stage 1 through stage 4 chronic kidney disease, or unspecified chronic kidney disease: Secondary | ICD-10-CM | POA: Diagnosis not present

## 2019-01-13 DIAGNOSIS — D631 Anemia in chronic kidney disease: Secondary | ICD-10-CM | POA: Diagnosis not present

## 2019-01-13 DIAGNOSIS — E785 Hyperlipidemia, unspecified: Secondary | ICD-10-CM | POA: Diagnosis not present

## 2019-01-13 DIAGNOSIS — Z170 Estrogen receptor positive status [ER+]: Secondary | ICD-10-CM | POA: Diagnosis not present

## 2019-01-13 DIAGNOSIS — N2581 Secondary hyperparathyroidism of renal origin: Secondary | ICD-10-CM | POA: Diagnosis not present

## 2019-01-13 DIAGNOSIS — F419 Anxiety disorder, unspecified: Secondary | ICD-10-CM | POA: Diagnosis not present

## 2019-01-23 DIAGNOSIS — E785 Hyperlipidemia, unspecified: Secondary | ICD-10-CM | POA: Diagnosis not present

## 2019-02-13 DIAGNOSIS — E876 Hypokalemia: Secondary | ICD-10-CM | POA: Diagnosis not present

## 2019-03-01 DIAGNOSIS — E876 Hypokalemia: Secondary | ICD-10-CM | POA: Diagnosis not present

## 2019-03-06 DIAGNOSIS — E876 Hypokalemia: Secondary | ICD-10-CM | POA: Diagnosis not present

## 2019-03-27 ENCOUNTER — Telehealth: Payer: Self-pay | Admitting: Oncology

## 2019-03-27 ENCOUNTER — Other Ambulatory Visit: Payer: Self-pay

## 2019-03-27 ENCOUNTER — Telehealth: Payer: Self-pay

## 2019-03-27 ENCOUNTER — Encounter: Payer: Self-pay | Admitting: Oncology

## 2019-03-27 ENCOUNTER — Inpatient Hospital Stay: Payer: PPO | Attending: Oncology

## 2019-03-27 DIAGNOSIS — C50912 Malignant neoplasm of unspecified site of left female breast: Secondary | ICD-10-CM | POA: Diagnosis not present

## 2019-03-27 DIAGNOSIS — C50412 Malignant neoplasm of upper-outer quadrant of left female breast: Secondary | ICD-10-CM

## 2019-03-27 DIAGNOSIS — Z170 Estrogen receptor positive status [ER+]: Secondary | ICD-10-CM

## 2019-03-27 LAB — COMPREHENSIVE METABOLIC PANEL
ALT: 17 U/L (ref 0–44)
AST: 18 U/L (ref 15–41)
Albumin: 3.6 g/dL (ref 3.5–5.0)
Alkaline Phosphatase: 108 U/L (ref 38–126)
Anion gap: 15 (ref 5–15)
BUN: 12 mg/dL (ref 8–23)
CO2: 31 mmol/L (ref 22–32)
Calcium: 9.8 mg/dL (ref 8.9–10.3)
Chloride: 92 mmol/L — ABNORMAL LOW (ref 98–111)
Creatinine, Ser: 1.66 mg/dL — ABNORMAL HIGH (ref 0.44–1.00)
GFR calc Af Amer: 34 mL/min — ABNORMAL LOW (ref >60)
GFR calc non Af Amer: 30 mL/min — ABNORMAL LOW (ref >60)
Glucose, Bld: 229 mg/dL — ABNORMAL HIGH (ref 70–99)
Potassium: 2.5 mmol/L — CL (ref 3.5–5.1)
Sodium: 138 mmol/L (ref 135–145)
Total Bilirubin: 0.5 mg/dL (ref 0.3–1.2)
Total Protein: 7.6 g/dL (ref 6.5–8.1)

## 2019-03-27 LAB — CBC WITH DIFFERENTIAL/PLATELET
Abs Immature Granulocytes: 0.06 10*3/uL (ref 0.00–0.07)
Basophils Absolute: 0 10*3/uL (ref 0.0–0.1)
Basophils Relative: 1 %
Eosinophils Absolute: 0.2 10*3/uL (ref 0.0–0.5)
Eosinophils Relative: 2 %
HCT: 38 % (ref 36.0–46.0)
Hemoglobin: 12.8 g/dL (ref 12.0–15.0)
Immature Granulocytes: 1 %
Lymphocytes Relative: 23 %
Lymphs Abs: 1.8 10*3/uL (ref 0.7–4.0)
MCH: 28.2 pg (ref 26.0–34.0)
MCHC: 33.7 g/dL (ref 30.0–36.0)
MCV: 83.7 fL (ref 80.0–100.0)
Monocytes Absolute: 0.4 10*3/uL (ref 0.1–1.0)
Monocytes Relative: 6 %
Neutro Abs: 5.4 10*3/uL (ref 1.7–7.7)
Neutrophils Relative %: 67 %
Platelets: 270 10*3/uL (ref 150–400)
RBC: 4.54 MIL/uL (ref 3.87–5.11)
RDW: 13.6 % (ref 11.5–15.5)
WBC: 7.9 10*3/uL (ref 4.0–10.5)
nRBC: 0 % (ref 0.0–0.2)

## 2019-03-27 NOTE — Progress Notes (Unsigned)
03/27/2019 @ 1117am - recevied critical value K+ 2.5 Verbally communicated to E. Shumate, RN with repeat back.

## 2019-03-27 NOTE — Telephone Encounter (Signed)
CRITICAL VALUE STICKER  CRITICAL VALUE: Potassium 2.5  RECEIVER (on-site recipient of call): Geanie Kenning., RN   MD NOTIFIED: Dr. Jana Hakim  RESPONSE:  Ensure patient is taking Potassium 20 mEq daily, if not re-start.  If patient is taking Potassium 20 mEq daily, increase to BID.    RN notified patient, patient reports taking only 10 mEq of potassium.  Pt will increase dose to 20 mEq of Potassium.  Pt states she has Rx already, no need to send new one to pharmacy.  Pt to have labs drawn at PCP at end of month.

## 2019-04-04 DIAGNOSIS — B078 Other viral warts: Secondary | ICD-10-CM | POA: Diagnosis not present

## 2019-04-04 DIAGNOSIS — L570 Actinic keratosis: Secondary | ICD-10-CM | POA: Diagnosis not present

## 2019-04-04 DIAGNOSIS — D485 Neoplasm of uncertain behavior of skin: Secondary | ICD-10-CM | POA: Diagnosis not present

## 2019-04-04 DIAGNOSIS — L820 Inflamed seborrheic keratosis: Secondary | ICD-10-CM | POA: Diagnosis not present

## 2019-04-20 DIAGNOSIS — E1169 Type 2 diabetes mellitus with other specified complication: Secondary | ICD-10-CM | POA: Diagnosis not present

## 2019-04-20 DIAGNOSIS — F331 Major depressive disorder, recurrent, moderate: Secondary | ICD-10-CM | POA: Diagnosis not present

## 2019-04-20 DIAGNOSIS — I129 Hypertensive chronic kidney disease with stage 1 through stage 4 chronic kidney disease, or unspecified chronic kidney disease: Secondary | ICD-10-CM | POA: Diagnosis not present

## 2019-04-20 DIAGNOSIS — Z0000 Encounter for general adult medical examination without abnormal findings: Secondary | ICD-10-CM | POA: Diagnosis not present

## 2019-04-20 DIAGNOSIS — C50412 Malignant neoplasm of upper-outer quadrant of left female breast: Secondary | ICD-10-CM | POA: Diagnosis not present

## 2019-04-20 DIAGNOSIS — M8588 Other specified disorders of bone density and structure, other site: Secondary | ICD-10-CM | POA: Diagnosis not present

## 2019-04-20 DIAGNOSIS — E039 Hypothyroidism, unspecified: Secondary | ICD-10-CM | POA: Diagnosis not present

## 2019-04-20 DIAGNOSIS — E559 Vitamin D deficiency, unspecified: Secondary | ICD-10-CM | POA: Diagnosis not present

## 2019-04-20 DIAGNOSIS — N183 Chronic kidney disease, stage 3 unspecified: Secondary | ICD-10-CM | POA: Diagnosis not present

## 2019-04-20 DIAGNOSIS — H6121 Impacted cerumen, right ear: Secondary | ICD-10-CM | POA: Diagnosis not present

## 2019-04-20 DIAGNOSIS — E785 Hyperlipidemia, unspecified: Secondary | ICD-10-CM | POA: Diagnosis not present

## 2019-04-25 ENCOUNTER — Other Ambulatory Visit: Payer: Self-pay | Admitting: Family Medicine

## 2019-04-25 DIAGNOSIS — M8580 Other specified disorders of bone density and structure, unspecified site: Secondary | ICD-10-CM

## 2019-05-08 ENCOUNTER — Other Ambulatory Visit: Payer: Self-pay | Admitting: Chiropractic Medicine

## 2019-05-08 DIAGNOSIS — M5416 Radiculopathy, lumbar region: Secondary | ICD-10-CM

## 2019-05-15 ENCOUNTER — Ambulatory Visit
Admission: RE | Admit: 2019-05-15 | Discharge: 2019-05-15 | Disposition: A | Discharge status: Home / Self Care | Payer: PPO | Source: Ambulatory Visit | Attending: Chiropractic Medicine | Admitting: Chiropractic Medicine

## 2019-05-15 ENCOUNTER — Other Ambulatory Visit: Payer: Self-pay

## 2019-05-15 ENCOUNTER — Other Ambulatory Visit: Payer: Self-pay | Admitting: Chiropractic Medicine

## 2019-05-15 DIAGNOSIS — M5136 Other intervertebral disc degeneration, lumbar region: Secondary | ICD-10-CM | POA: Diagnosis not present

## 2019-05-15 DIAGNOSIS — M5416 Radiculopathy, lumbar region: Secondary | ICD-10-CM

## 2019-05-15 MED ORDER — IOPAMIDOL (ISOVUE-M 200) INJECTION 41%
1.0000 mL | Freq: Once | INTRAMUSCULAR | Status: AC
  Administered 2019-05-15: 1 mL via EPIDURAL

## 2019-05-15 MED ORDER — METHYLPREDNISOLONE ACETATE 40 MG/ML INJ SUSP (RADIOLOG
120.0000 mg | Freq: Once | INTRAMUSCULAR | Status: AC
  Administered 2019-05-15: 120 mg via EPIDURAL

## 2019-05-18 ENCOUNTER — Other Ambulatory Visit: Payer: Self-pay | Admitting: Family Medicine

## 2019-05-18 ENCOUNTER — Other Ambulatory Visit: Payer: Self-pay | Admitting: Oncology

## 2019-05-18 DIAGNOSIS — Z853 Personal history of malignant neoplasm of breast: Secondary | ICD-10-CM

## 2019-06-14 DIAGNOSIS — L821 Other seborrheic keratosis: Secondary | ICD-10-CM | POA: Diagnosis not present

## 2019-06-14 DIAGNOSIS — B078 Other viral warts: Secondary | ICD-10-CM | POA: Diagnosis not present

## 2019-06-14 DIAGNOSIS — Z808 Family history of malignant neoplasm of other organs or systems: Secondary | ICD-10-CM | POA: Diagnosis not present

## 2019-06-14 DIAGNOSIS — Z85828 Personal history of other malignant neoplasm of skin: Secondary | ICD-10-CM | POA: Diagnosis not present

## 2019-06-14 DIAGNOSIS — D485 Neoplasm of uncertain behavior of skin: Secondary | ICD-10-CM | POA: Diagnosis not present

## 2019-06-14 DIAGNOSIS — L820 Inflamed seborrheic keratosis: Secondary | ICD-10-CM | POA: Diagnosis not present

## 2019-06-14 DIAGNOSIS — L578 Other skin changes due to chronic exposure to nonionizing radiation: Secondary | ICD-10-CM | POA: Diagnosis not present

## 2019-06-14 DIAGNOSIS — C44619 Basal cell carcinoma of skin of left upper limb, including shoulder: Secondary | ICD-10-CM | POA: Diagnosis not present

## 2019-06-14 DIAGNOSIS — D2272 Melanocytic nevi of left lower limb, including hip: Secondary | ICD-10-CM | POA: Diagnosis not present

## 2019-06-25 ENCOUNTER — Other Ambulatory Visit: Payer: Self-pay | Admitting: Oncology

## 2019-07-14 ENCOUNTER — Other Ambulatory Visit: Payer: Self-pay | Admitting: Physical Medicine and Rehabilitation

## 2019-07-14 DIAGNOSIS — M5416 Radiculopathy, lumbar region: Secondary | ICD-10-CM

## 2019-07-18 ENCOUNTER — Other Ambulatory Visit: Payer: Self-pay

## 2019-07-18 ENCOUNTER — Ambulatory Visit
Admission: RE | Admit: 2019-07-18 | Discharge: 2019-07-18 | Disposition: A | Discharge status: Home / Self Care | Payer: PPO | Source: Ambulatory Visit | Attending: Oncology | Admitting: Oncology

## 2019-07-18 DIAGNOSIS — Z853 Personal history of malignant neoplasm of breast: Secondary | ICD-10-CM | POA: Diagnosis not present

## 2019-07-18 DIAGNOSIS — R922 Inconclusive mammogram: Secondary | ICD-10-CM | POA: Diagnosis not present

## 2019-07-25 DIAGNOSIS — C44519 Basal cell carcinoma of skin of other part of trunk: Secondary | ICD-10-CM | POA: Diagnosis not present

## 2019-07-26 ENCOUNTER — Other Ambulatory Visit: Payer: Self-pay

## 2019-07-26 ENCOUNTER — Ambulatory Visit
Admission: RE | Admit: 2019-07-26 | Discharge: 2019-07-26 | Disposition: A | Discharge status: Home / Self Care | Payer: PPO | Source: Ambulatory Visit | Attending: Physical Medicine and Rehabilitation | Admitting: Physical Medicine and Rehabilitation

## 2019-07-26 DIAGNOSIS — M5416 Radiculopathy, lumbar region: Secondary | ICD-10-CM

## 2019-07-26 DIAGNOSIS — M545 Low back pain: Secondary | ICD-10-CM | POA: Diagnosis not present

## 2019-07-26 MED ORDER — IOPAMIDOL (ISOVUE-M 200) INJECTION 41%
1.0000 mL | Freq: Once | INTRAMUSCULAR | Status: AC
  Administered 2019-07-26: 1 mL via EPIDURAL

## 2019-07-26 MED ORDER — METHYLPREDNISOLONE ACETATE 40 MG/ML INJ SUSP (RADIOLOG
120.0000 mg | Freq: Once | INTRAMUSCULAR | Status: AC
  Administered 2019-07-26: 120 mg via EPIDURAL

## 2019-07-26 NOTE — Discharge Instructions (Signed)

## 2019-08-01 DIAGNOSIS — C50412 Malignant neoplasm of upper-outer quadrant of left female breast: Secondary | ICD-10-CM | POA: Diagnosis not present

## 2019-08-01 DIAGNOSIS — M79641 Pain in right hand: Secondary | ICD-10-CM | POA: Diagnosis not present

## 2019-08-01 DIAGNOSIS — E785 Hyperlipidemia, unspecified: Secondary | ICD-10-CM | POA: Diagnosis not present

## 2019-08-01 DIAGNOSIS — N183 Chronic kidney disease, stage 3 unspecified: Secondary | ICD-10-CM | POA: Diagnosis not present

## 2019-08-01 DIAGNOSIS — E1169 Type 2 diabetes mellitus with other specified complication: Secondary | ICD-10-CM | POA: Diagnosis not present

## 2019-08-01 DIAGNOSIS — I129 Hypertensive chronic kidney disease with stage 1 through stage 4 chronic kidney disease, or unspecified chronic kidney disease: Secondary | ICD-10-CM | POA: Diagnosis not present

## 2019-08-14 DIAGNOSIS — M65331 Trigger finger, right middle finger: Secondary | ICD-10-CM | POA: Diagnosis not present

## 2019-08-14 DIAGNOSIS — M65341 Trigger finger, right ring finger: Secondary | ICD-10-CM | POA: Diagnosis not present

## 2019-08-14 DIAGNOSIS — M65849 Other synovitis and tenosynovitis, unspecified hand: Secondary | ICD-10-CM | POA: Diagnosis not present

## 2019-08-14 DIAGNOSIS — M79644 Pain in right finger(s): Secondary | ICD-10-CM | POA: Diagnosis not present

## 2019-09-14 DIAGNOSIS — M79644 Pain in right finger(s): Secondary | ICD-10-CM | POA: Diagnosis not present

## 2019-09-14 DIAGNOSIS — M65331 Trigger finger, right middle finger: Secondary | ICD-10-CM | POA: Diagnosis not present

## 2019-09-14 DIAGNOSIS — M65341 Trigger finger, right ring finger: Secondary | ICD-10-CM | POA: Diagnosis not present

## 2019-09-21 DIAGNOSIS — E119 Type 2 diabetes mellitus without complications: Secondary | ICD-10-CM | POA: Diagnosis not present

## 2019-09-21 DIAGNOSIS — H2513 Age-related nuclear cataract, bilateral: Secondary | ICD-10-CM | POA: Diagnosis not present

## 2019-10-03 NOTE — Telephone Encounter (Signed)
Error. Bralynn Velador M Clydean Posas, RN  

## 2019-10-17 DIAGNOSIS — I129 Hypertensive chronic kidney disease with stage 1 through stage 4 chronic kidney disease, or unspecified chronic kidney disease: Secondary | ICD-10-CM | POA: Diagnosis not present

## 2019-10-17 DIAGNOSIS — N189 Chronic kidney disease, unspecified: Secondary | ICD-10-CM | POA: Diagnosis not present

## 2019-10-17 DIAGNOSIS — F329 Major depressive disorder, single episode, unspecified: Secondary | ICD-10-CM | POA: Diagnosis not present

## 2019-10-17 DIAGNOSIS — N183 Chronic kidney disease, stage 3 unspecified: Secondary | ICD-10-CM | POA: Diagnosis not present

## 2019-10-17 DIAGNOSIS — D631 Anemia in chronic kidney disease: Secondary | ICD-10-CM | POA: Diagnosis not present

## 2019-10-17 DIAGNOSIS — Z170 Estrogen receptor positive status [ER+]: Secondary | ICD-10-CM | POA: Diagnosis not present

## 2019-10-17 DIAGNOSIS — E785 Hyperlipidemia, unspecified: Secondary | ICD-10-CM | POA: Diagnosis not present

## 2019-10-17 DIAGNOSIS — F419 Anxiety disorder, unspecified: Secondary | ICD-10-CM | POA: Diagnosis not present

## 2019-10-17 DIAGNOSIS — C50919 Malignant neoplasm of unspecified site of unspecified female breast: Secondary | ICD-10-CM | POA: Diagnosis not present

## 2019-10-17 DIAGNOSIS — N2581 Secondary hyperparathyroidism of renal origin: Secondary | ICD-10-CM | POA: Diagnosis not present

## 2019-10-17 DIAGNOSIS — M170 Bilateral primary osteoarthritis of knee: Secondary | ICD-10-CM | POA: Diagnosis not present

## 2019-10-26 DIAGNOSIS — E1169 Type 2 diabetes mellitus with other specified complication: Secondary | ICD-10-CM | POA: Diagnosis not present

## 2019-10-26 DIAGNOSIS — C50412 Malignant neoplasm of upper-outer quadrant of left female breast: Secondary | ICD-10-CM | POA: Diagnosis not present

## 2019-10-26 DIAGNOSIS — M1990 Unspecified osteoarthritis, unspecified site: Secondary | ICD-10-CM | POA: Diagnosis not present

## 2019-10-26 DIAGNOSIS — N183 Chronic kidney disease, stage 3 unspecified: Secondary | ICD-10-CM | POA: Diagnosis not present

## 2019-10-26 DIAGNOSIS — I129 Hypertensive chronic kidney disease with stage 1 through stage 4 chronic kidney disease, or unspecified chronic kidney disease: Secondary | ICD-10-CM | POA: Diagnosis not present

## 2019-10-26 DIAGNOSIS — E785 Hyperlipidemia, unspecified: Secondary | ICD-10-CM | POA: Diagnosis not present

## 2019-10-26 DIAGNOSIS — E039 Hypothyroidism, unspecified: Secondary | ICD-10-CM | POA: Diagnosis not present

## 2019-10-26 DIAGNOSIS — F331 Major depressive disorder, recurrent, moderate: Secondary | ICD-10-CM | POA: Diagnosis not present

## 2019-11-03 DIAGNOSIS — M25562 Pain in left knee: Secondary | ICD-10-CM | POA: Diagnosis not present

## 2019-11-06 DIAGNOSIS — M25562 Pain in left knee: Secondary | ICD-10-CM | POA: Diagnosis not present

## 2019-11-06 DIAGNOSIS — M25362 Other instability, left knee: Secondary | ICD-10-CM | POA: Diagnosis not present

## 2019-11-07 DIAGNOSIS — E876 Hypokalemia: Secondary | ICD-10-CM | POA: Diagnosis not present

## 2019-11-09 DIAGNOSIS — M25362 Other instability, left knee: Secondary | ICD-10-CM | POA: Diagnosis not present

## 2019-11-09 DIAGNOSIS — M25562 Pain in left knee: Secondary | ICD-10-CM | POA: Diagnosis not present

## 2019-11-13 DIAGNOSIS — F331 Major depressive disorder, recurrent, moderate: Secondary | ICD-10-CM | POA: Diagnosis not present

## 2019-11-13 DIAGNOSIS — M1990 Unspecified osteoarthritis, unspecified site: Secondary | ICD-10-CM | POA: Diagnosis not present

## 2019-11-13 DIAGNOSIS — N183 Chronic kidney disease, stage 3 unspecified: Secondary | ICD-10-CM | POA: Diagnosis not present

## 2019-11-13 DIAGNOSIS — R051 Acute cough: Secondary | ICD-10-CM | POA: Diagnosis not present

## 2019-11-13 DIAGNOSIS — E039 Hypothyroidism, unspecified: Secondary | ICD-10-CM | POA: Diagnosis not present

## 2019-11-13 DIAGNOSIS — E785 Hyperlipidemia, unspecified: Secondary | ICD-10-CM | POA: Diagnosis not present

## 2019-11-13 DIAGNOSIS — E1169 Type 2 diabetes mellitus with other specified complication: Secondary | ICD-10-CM | POA: Diagnosis not present

## 2019-11-13 DIAGNOSIS — Z20822 Contact with and (suspected) exposure to covid-19: Secondary | ICD-10-CM | POA: Diagnosis not present

## 2019-11-13 DIAGNOSIS — C50412 Malignant neoplasm of upper-outer quadrant of left female breast: Secondary | ICD-10-CM | POA: Diagnosis not present

## 2019-11-13 DIAGNOSIS — I129 Hypertensive chronic kidney disease with stage 1 through stage 4 chronic kidney disease, or unspecified chronic kidney disease: Secondary | ICD-10-CM | POA: Diagnosis not present

## 2019-11-16 ENCOUNTER — Other Ambulatory Visit: Payer: Self-pay | Admitting: Physician Assistant

## 2019-11-16 ENCOUNTER — Ambulatory Visit (HOSPITAL_COMMUNITY)
Admission: RE | Admit: 2019-11-16 | Discharge: 2019-11-16 | Disposition: A | Discharge status: Home / Self Care | Payer: Medicare Other | Source: Ambulatory Visit | Attending: Pulmonary Disease | Admitting: Pulmonary Disease

## 2019-11-16 DIAGNOSIS — Z170 Estrogen receptor positive status [ER+]: Secondary | ICD-10-CM

## 2019-11-16 DIAGNOSIS — U071 COVID-19: Secondary | ICD-10-CM

## 2019-11-16 DIAGNOSIS — I10 Essential (primary) hypertension: Secondary | ICD-10-CM | POA: Insufficient documentation

## 2019-11-16 DIAGNOSIS — C50412 Malignant neoplasm of upper-outer quadrant of left female breast: Secondary | ICD-10-CM

## 2019-11-16 DIAGNOSIS — Z23 Encounter for immunization: Secondary | ICD-10-CM | POA: Insufficient documentation

## 2019-11-16 MED ORDER — SODIUM CHLORIDE 0.9 % IV SOLN: INTRAVENOUS | Status: DC | PRN

## 2019-11-16 MED ORDER — FAMOTIDINE IN NACL 20-0.9 MG/50ML-% IV SOLN: 20.0000 mg | Freq: Once | INTRAVENOUS | Status: DC | PRN

## 2019-11-16 MED ORDER — METHYLPREDNISOLONE SODIUM SUCC 125 MG IJ SOLR: 125.0000 mg | Freq: Once | INTRAMUSCULAR | Status: DC | PRN

## 2019-11-16 MED ORDER — DIPHENHYDRAMINE HCL 50 MG/ML IJ SOLN: 50.0000 mg | Freq: Once | INTRAMUSCULAR | Status: DC | PRN

## 2019-11-16 MED ORDER — SODIUM CHLORIDE 0.9 % IV SOLN: Freq: Once | INTRAVENOUS | Status: AC

## 2019-11-16 MED ORDER — ALBUTEROL SULFATE HFA 108 (90 BASE) MCG/ACT IN AERS: 2.0000 | INHALATION_SPRAY | Freq: Once | RESPIRATORY_TRACT | Status: DC | PRN

## 2019-11-16 MED ORDER — EPINEPHRINE 0.3 MG/0.3ML IJ SOAJ: 0.3000 mg | Freq: Once | INTRAMUSCULAR | Status: DC | PRN

## 2019-11-16 NOTE — Discharge Instructions (Signed)

## 2019-11-16 NOTE — Progress Notes (Signed)
I connected by phone with [name] on 11/16/2019 at 9:02 AM to discuss the potential use of a new treatment for mild to moderate COVID-19 viral infection in non-hospitalized patients.  This patient is a 79 y.o. female that meets the FDA criteria for Emergency Use Authorization of COVID monoclonal antibody casirivimab/imdevimab or bamlamivimab/estevimab.  Has a (+) direct SARS-CoV-2 viral test result  Has mild or moderate COVID-19   Is NOT hospitalized due to COVID-19  Is within 10 days of symptom onset  Has at least one of the high risk factor(s) for progression to severe COVID-19 and/or hospitalization as defined in EUA.  Specific high risk criteria : Older age (>/= 77 yo), BMI > 25, Diabetes, Immunosuppressive Disease or Treatment and Cardiovascular disease or hypertension   I have spoken and communicated the following to the patient or parent/caregiver regarding COVID monoclonal antibody treatment:  1. FDA has authorized the emergency use for the treatment of mild to moderate COVID-19 in adults and pediatric patients with positive results of direct SARS-CoV-2 viral testing who are 44 years of age and older weighing at least 40 kg, and who are at high risk for progressing to severe COVID-19 and/or hospitalization.  2. The significant known and potential risks and benefits of COVID monoclonal antibody, and the extent to which such potential risks and benefits are unknown.  3. Information on available alternative treatments and the risks and benefits of those alternatives, including clinical trials.  4. Patients treated with COVID monoclonal antibody should continue to self-isolate and use infection control measures (e.g., wear mask, isolate, social distance, avoid sharing personal items, clean and disinfect "high touch" surfaces, and frequent handwashing) according to CDC guidelines.   5. The patient or parent/caregiver has the option to accept or refuse COVID monoclonal antibody  treatment.  After reviewing this information with the patient, the patient has agreed to receive one of the available covid 19 monoclonal antibodies and will be provided an appropriate fact sheet prior to infusion.  Sx onset 10/18. Set up for infusion on 10/21 @ 12:30pm. Directions given to [facility]. Pt is aware that insurance will be charged an infusion fee. Pt is fully vaccinated.   Angelena Form 11/16/2019 9:02 AM

## 2019-11-16 NOTE — Progress Notes (Signed)
  Diagnosis: COVID-19  Physician: Dr. Wright  Procedure: [facility] Infusion Clinic Med: bamlanivimab\etesevimab infusion - Provided patient with bamlanimivab\etesevimab fact sheet for patients, parents and caregivers prior to infusion.  Complications: No immediate complications noted.  Discharge: Discharged home   Ngoc Daughtridge J Tyeesha Riker 11/16/2019  

## 2019-12-08 DIAGNOSIS — I129 Hypertensive chronic kidney disease with stage 1 through stage 4 chronic kidney disease, or unspecified chronic kidney disease: Secondary | ICD-10-CM | POA: Diagnosis not present

## 2019-12-08 DIAGNOSIS — F331 Major depressive disorder, recurrent, moderate: Secondary | ICD-10-CM | POA: Diagnosis not present

## 2019-12-08 DIAGNOSIS — E785 Hyperlipidemia, unspecified: Secondary | ICD-10-CM | POA: Diagnosis not present

## 2019-12-08 DIAGNOSIS — R5383 Other fatigue: Secondary | ICD-10-CM | POA: Diagnosis not present

## 2019-12-08 DIAGNOSIS — N183 Chronic kidney disease, stage 3 unspecified: Secondary | ICD-10-CM | POA: Diagnosis not present

## 2019-12-08 DIAGNOSIS — E039 Hypothyroidism, unspecified: Secondary | ICD-10-CM | POA: Diagnosis not present

## 2019-12-08 DIAGNOSIS — E1169 Type 2 diabetes mellitus with other specified complication: Secondary | ICD-10-CM | POA: Diagnosis not present

## 2019-12-08 DIAGNOSIS — E559 Vitamin D deficiency, unspecified: Secondary | ICD-10-CM | POA: Diagnosis not present

## 2019-12-08 DIAGNOSIS — Z8616 Personal history of COVID-19: Secondary | ICD-10-CM | POA: Diagnosis not present

## 2019-12-19 ENCOUNTER — Other Ambulatory Visit: Payer: PPO

## 2019-12-26 DIAGNOSIS — M1990 Unspecified osteoarthritis, unspecified site: Secondary | ICD-10-CM | POA: Diagnosis not present

## 2019-12-26 DIAGNOSIS — E039 Hypothyroidism, unspecified: Secondary | ICD-10-CM | POA: Diagnosis not present

## 2019-12-26 DIAGNOSIS — K219 Gastro-esophageal reflux disease without esophagitis: Secondary | ICD-10-CM | POA: Diagnosis not present

## 2019-12-26 DIAGNOSIS — F331 Major depressive disorder, recurrent, moderate: Secondary | ICD-10-CM | POA: Diagnosis not present

## 2019-12-26 DIAGNOSIS — E1169 Type 2 diabetes mellitus with other specified complication: Secondary | ICD-10-CM | POA: Diagnosis not present

## 2019-12-26 DIAGNOSIS — I129 Hypertensive chronic kidney disease with stage 1 through stage 4 chronic kidney disease, or unspecified chronic kidney disease: Secondary | ICD-10-CM | POA: Diagnosis not present

## 2019-12-26 DIAGNOSIS — N183 Chronic kidney disease, stage 3 unspecified: Secondary | ICD-10-CM | POA: Diagnosis not present

## 2019-12-26 DIAGNOSIS — C50412 Malignant neoplasm of upper-outer quadrant of left female breast: Secondary | ICD-10-CM | POA: Diagnosis not present

## 2019-12-26 DIAGNOSIS — E785 Hyperlipidemia, unspecified: Secondary | ICD-10-CM | POA: Diagnosis not present

## 2019-12-26 NOTE — Progress Notes (Signed)
[facility]  [phone] [phone]   NB: Patient did not show for he 03/15/2017 appt  ID: [name] DOB: 21-Dec-1940  MR#: [id number]  JFH#:[id number]  Patient Care Team: [name], MD as PCP - General (Family Medicine) Khaleed Holan, Virgie Dad, MD as Consulting Physician (Oncology) Roseanne Kaufman, MD as Consulting Physician (Orthopedic Surgery) Juanita Craver, MD as Consulting Physician (Gastroenterology) Jari Pigg, MD as Consulting Physician (Dermatology) Kathie Rhodes, MD (Inactive) as Consulting Physician (Urology) Kyung Rudd, MD as Consulting Physician (Radiation Oncology) Rolm Bookbinder, MD as Consulting Physician (General Surgery) OTHER MD:  CHIEF COMPLAINT: estrogen receptor positive breast cancer  CURRENT TREATMENT: Anastrozole   INTERVAL HISTORY: [name] returns today for follow-up of of her estrogen receptor positive breast cancer.   She continues on anastrozole.  She has no significant side effects from the medication and specifically hot flashes and vaginal dryness are not a concern.  [name]'s last bone density screening on 12/15/2017, showed a T-score of -1.6, which is considered osteopenic.  A repeat bone density has been ordered but not scheduled  Since her last visit, she underwent bilateral diagnostic mammography with tomography at [facility] on 07/18/2019 showing: breast density category C; no evidence of malignancy in either breast.    REVIEW OF SYSTEMS: [name] and most of her family had Covid in 17-Nov-2022.  Nobody died thankfully.  She tells me that she lost her sense of smell for a while but it is back.  She says she is still weak from the experience.  She is not exercising regularly.  She has significant knee problems and she is no longer a gym member.  She does not have any equipment at home.  She does not garden.  She tells me that there is something she found in her left armpit that worries her.  Aside from these  issues a detailed review of systems today was stable   COVID 19 VACCINATION STATUS: Had [place] vaccine x2 then had Covid 11-17-2019, did receive antibiotic infusion   HISTORY OF CURRENT ILLNESS:  From the original intake note:  [name] initially palpated a lump to her left breast while watching TV which she brought to medical attention and was evaluated 1 week following by her PCP, Dr. Coralyn Mark. She then had an unilateral diagnostic left mammography with tomography and CAD and left breast ultrasonography at [facility] on 11/17/2016 showing a 1.6 cm mass in the upper left breast suspicous for malignancy. The left axilla was benign.  Accordingly on 11/20/2016, she proceeded to biopsy of the left breast area in question. The pathology from this procedure showed ([id number]): invasive mammary carcinoma. Prognostic indicators: ER: 100% positive; PR: 5% positive; Both with strong staining intensity. Proliferation marker Ki67: 15% ; HER-2: negative.  The patient's subsequent history is as detailed below.   PAST MEDICAL HISTORY: Past Medical History:  Diagnosis Date  . Anxiety   . Arthritis    knees  . Cancer ([facility]) 11/2016   Left breast cancer  . Depression   . Diabetes mellitus without complication ([facility])   . Diverticulitis   . Diverticulosis    bleeding  . GERD (gastroesophageal reflux disease)   . Hypertension   . Hyperthyroidism    nodule on thyroid, Radioactive, now hypo  . Hypothyroidism, iatrogenic    After RI now hypo on synthroid  . Personal history of radiation therapy     PAST SURGICAL HISTORY: Past Surgical History:  Procedure Laterality Date  . ABDOMINAL HYSTERECTOMY  1977  . BLADDER SURGERY     bladder suspension  . BREAST EXCISIONAL BIOPSY Right   . BREAST LUMPECTOMY Left 2018  . BREAST LUMPECTOMY WITH RADIOACTIVE SEED AND SENTINEL LYMPH NODE BIOPSY Left 12/11/2016   Procedure: BREAST LUMPECTOMY WITH RADIOACTIVE SEED AND SENTINEL LYMPH NODE BIOPSY;   Surgeon: Rolm Bookbinder, MD;  Location: [facility];  Service: General;  Laterality: Left;  . CHOLECYSTECTOMY N/A 03/29/2013   Procedure: LAPAROSCOPIC CHOLECYSTECTOMY WITH INTRAOPERATIVE CHOLANGIOGRAM;  Surgeon: Edward Jolly, MD;  Location: [facility];  Service: General;  Laterality: N/A;  . JOINT REPLACEMENT Right 2009   total knee replacement  . ROTATOR CUFF REPAIR Left   . TOTAL KNEE REVISION  12/17/2010   Procedure: TOTAL KNEE REVISION;  Surgeon: Dione Plover Aluisio;  Location: [facility];  Service: Orthopedics;  Laterality: Right;    FAMILY HISTORY: Family History  Problem Relation Age of Onset  . Diverticulitis Mother   . Alzheimer's disease Mother   . Colon cancer Maternal Grandmother   . Heart attack Father   . Breast cancer Daughter    Her father died at age 69 from heart failure. Her mother died at age 31 from Alzheimer's. She has 1 sister and no brothers. Her maternal grandmother had colon cancer and she is unsure of what age she was diagnosed with colon cancer. Her father was diagnosed with skin cancer in his late 61's, and she notes that he didn't have melanoma. Her daughter had DCIS breast cancer diagnosed at 53. She hasn't had genetic testing completed as of yet. Her oldest daughter had genetic testing which came back normal.    GYNECOLOGIC HISTORY:  No LMP recorded. Patient has had a hysterectomy.  Menarche: 79 years old Age at first live birth: 79 years old GP: GxP3 LMP: Had a hysterectomy Contraceptive: OCP HRT: Yes, Estrogen and Progesterone combination for over 30 years.    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 She has 3 children from her first marriage: [name] age 43 who is Mudlogger of a Location manager in [place]. [name] is 54 in Sales in [place]. [name] is 60 and is a 5th grade teacher in [place]. She has a granddaughter that is 70 years old.   She has 2 great-grandchildren and twins on the way (as of December 2020)              ADVANCED DIRECTIVES: Her oldest daughter, [name] is the [employer] of [place] and can be reached at [phone].    HEALTH MAINTENANCE: Social History   Tobacco Use  . Smoking status: Never Smoker  . Smokeless tobacco: Never Used  Substance Use Topics  . Alcohol use: Yes    Comment: rarely    Colonoscopy:  PAP:  Bone density: at [facility] on 12/10/2015 found a T score of -1.6  Current Outpatient Medications on File Prior to Visit  Medication Sig Dispense Refill  . anastrozole (ARIMIDEX) 1 MG tablet TAKE 1 TABLET BY MOUTH EVERY DAY 90 tablet 4  . Cholecalciferol (VITAMIN D-3) 5000 units TABS Take by mouth. 30 tablet   . enalapril (VASOTEC) 10 MG tablet Take 10 mg daily by mouth.    . escitalopram (LEXAPRO) 10 MG tablet Take 10 mg daily by mouth.    Marland Kitchen glimepiride (AMARYL) 1 MG tablet Take 1 mg by mouth 2 (two) times daily.    . hydrochlorothiazide (  MICROZIDE) 12.5 MG capsule Take 12.5 mg daily by mouth.    . Levothyroxine Sodium 100 MCG CAPS Take 100 mcg daily by mouth.   1  . rosuvastatin (CRESTOR) 5 MG tablet Take 1 tablet by mouth as directed. Monday Wednesday friday  0   No current facility-administered medications on file prior to visit.   OBJECTIVE: White woman who appears stated  Vitals:   12/27/19 1015  BP: 135/71  Pulse: 83  Resp: 18  Temp: 97.6 F (36.4 C)  SpO2: 99%    Filed Weights   12/27/19 1015  Weight: 161 lb 12.8 oz (73.4 kg)  Body mass index is 29.12 kg/m.  Sclerae unicteric, EOMs intact Wearing a mask No cervical or supraclavicular adenopathy Lungs no rales or rhonchi Heart regular rate and rhythm Abd soft, nontender, positive bowel sounds MSK no focal spinal tenderness, no upper extremity lymphedema Neuro: nonfocal, well oriented, appropriate affect Breasts: The right breast is benign.  The left breast is status post lumpectomy  and radiation.  In the armpit the area of concern to her is slightly firm.  It is linear.  It does not feel like a lymph node.  It feels like fatty stranding or scar.   STUDIES: No results found.   ELIGIBLE FOR AVAILABLE RESEARCH PROTOCOL: no   ASSESSMENT: 79 y.o. [place] woman s/p left breast upper outer quadrant biopsy 11/20/2016 for a clinical T1c N0, stage IA invasive ductal carcinoma, grade 1, estrogen and progesterone receptor positive, HER-2 not amplified, with an Mib-1 of 15%  (1) s/p left lumpectomy with sentinel lymph node sampling 12/11/2016 for a pT1c pN0 invasive ductal carcinoma, grade 2, with negative margins  (2) oncotype score of 24 predicted a 10-year risk of recurrence outside the breast of 16% if the patient's only systemic therapy was tamoxifen for 5 years.  It also predicted no significant benefit from chemotherapy.  (3) adjuvant radiation 01/27/2017 - 02/23/2017 Site/dose:   The patient initially received a dose of 42.5 Gy in 17 fractions to the breast using whole-breast tangent fields. This was delivered using a 3-D conformal technique. The patient then received a boost to the seroma. This delivered an additional 7.5 Gy in 3 fractions using a 3 field photon technique due to the depth of the seroma. The total dose was 50 Gy.  (4) anastrozole started 03/16/2017  (a) bone density at [facility] 12/10/2015 found a T score of -1.6  (b) repeat bone density 12/15/2017 shows a T score of -1.6 (unchanged).  (c) off anastrozole January 2020 through May 2020 (patient did not refill prescription).   PLAN: [name] is now 3 years out from definitive surgery for her breast cancer with no evidence of disease recurrence.  This is very favorable.  She is tolerating anastrozole well and the plan is to continue that for a total of 5 years.  I have encouraged her to exercise regularly.  I know the knees are problem.  If walking is an issue perhaps a stationary bike would be  helpful.  She will have an ultrasound of the left axilla just to make sure but I reassured her that I do not feel anything suspicious for recurrence in the left axilla at this time  She knows to call for any other issue that may develop before the next visit.  Total encounter time 25 minutes.*   Analia Zuk, Valentino Hue, MD  12/27/19 10:39 AM Medical Oncology and Hematology [facility] [address] [place], [place] [address] Tel.  [phone]    Fax. [phone]   I, Wilburn Mylar, am acting as scribe for Dr. Virgie Dad. Brennyn Ortlieb.  I, Lurline Del MD, have reviewed the above documentation for accuracy and completeness, and I agree with the above.   *Total Encounter Time as defined by the Centers for Medicare and Medicaid Services includes, in addition to the face-to-face time of a patient visit (documented in the note above) non-face-to-face time: obtaining and reviewing outside history, ordering and reviewing medications, tests or procedures, care coordination (communications with other health care professionals or caregivers) and documentation in the medical record.

## 2019-12-27 ENCOUNTER — Inpatient Hospital Stay: Payer: PPO

## 2019-12-27 ENCOUNTER — Inpatient Hospital Stay: Payer: PPO | Attending: Oncology | Admitting: Oncology

## 2019-12-27 ENCOUNTER — Other Ambulatory Visit: Payer: Self-pay

## 2019-12-27 VITALS — BP 135/71 | HR 83 | Temp 97.6°F | Resp 18 | Ht 62.5 in | Wt 161.8 lb

## 2019-12-27 DIAGNOSIS — Z79899 Other long term (current) drug therapy: Secondary | ICD-10-CM | POA: Diagnosis not present

## 2019-12-27 DIAGNOSIS — C50412 Malignant neoplasm of upper-outer quadrant of left female breast: Secondary | ICD-10-CM

## 2019-12-27 DIAGNOSIS — E119 Type 2 diabetes mellitus without complications: Secondary | ICD-10-CM | POA: Diagnosis not present

## 2019-12-27 DIAGNOSIS — Z79811 Long term (current) use of aromatase inhibitors: Secondary | ICD-10-CM | POA: Insufficient documentation

## 2019-12-27 DIAGNOSIS — Z170 Estrogen receptor positive status [ER+]: Secondary | ICD-10-CM

## 2019-12-27 DIAGNOSIS — E039 Hypothyroidism, unspecified: Secondary | ICD-10-CM | POA: Diagnosis not present

## 2019-12-27 DIAGNOSIS — Z800 Family history of malignant neoplasm of digestive organs: Secondary | ICD-10-CM | POA: Diagnosis not present

## 2019-12-27 DIAGNOSIS — M129 Arthropathy, unspecified: Secondary | ICD-10-CM | POA: Diagnosis not present

## 2019-12-27 DIAGNOSIS — Z803 Family history of malignant neoplasm of breast: Secondary | ICD-10-CM | POA: Diagnosis not present

## 2019-12-27 DIAGNOSIS — Z923 Personal history of irradiation: Secondary | ICD-10-CM | POA: Insufficient documentation

## 2019-12-27 DIAGNOSIS — F418 Other specified anxiety disorders: Secondary | ICD-10-CM | POA: Insufficient documentation

## 2019-12-27 DIAGNOSIS — K219 Gastro-esophageal reflux disease without esophagitis: Secondary | ICD-10-CM | POA: Insufficient documentation

## 2019-12-27 DIAGNOSIS — I10 Essential (primary) hypertension: Secondary | ICD-10-CM | POA: Insufficient documentation

## 2019-12-27 LAB — CBC WITH DIFFERENTIAL/PLATELET
Abs Immature Granulocytes: 0.06 10*3/uL (ref 0.00–0.07)
Basophils Absolute: 0.1 10*3/uL (ref 0.0–0.1)
Basophils Relative: 1 %
Eosinophils Absolute: 0.2 10*3/uL (ref 0.0–0.5)
Eosinophils Relative: 2 %
HCT: 42.1 % (ref 36.0–46.0)
Hemoglobin: 14 g/dL (ref 12.0–15.0)
Immature Granulocytes: 1 %
Lymphocytes Relative: 27 %
Lymphs Abs: 2.2 10*3/uL (ref 0.7–4.0)
MCH: 27.9 pg (ref 26.0–34.0)
MCHC: 33.3 g/dL (ref 30.0–36.0)
MCV: 83.9 fL (ref 80.0–100.0)
Monocytes Absolute: 0.5 10*3/uL (ref 0.1–1.0)
Monocytes Relative: 7 %
Neutro Abs: 5.2 10*3/uL (ref 1.7–7.7)
Neutrophils Relative %: 62 %
Platelets: 311 10*3/uL (ref 150–400)
RBC: 5.02 MIL/uL (ref 3.87–5.11)
RDW: 14.1 % (ref 11.5–15.5)
WBC: 8.3 10*3/uL (ref 4.0–10.5)
nRBC: 0 % (ref 0.0–0.2)

## 2019-12-27 LAB — COMPREHENSIVE METABOLIC PANEL
ALT: 10 U/L (ref 0–44)
AST: 12 U/L — ABNORMAL LOW (ref 15–41)
Albumin: 3.6 g/dL (ref 3.5–5.0)
Alkaline Phosphatase: 98 U/L (ref 38–126)
Anion gap: 13 (ref 5–15)
BUN: 15 mg/dL (ref 8–23)
CO2: 25 mmol/L (ref 22–32)
Calcium: 9.8 mg/dL (ref 8.9–10.3)
Chloride: 102 mmol/L (ref 98–111)
Creatinine, Ser: 1.46 mg/dL — ABNORMAL HIGH (ref 0.44–1.00)
GFR, Estimated: 37 mL/min — ABNORMAL LOW (ref >60)
Glucose, Bld: 136 mg/dL — ABNORMAL HIGH (ref 70–99)
Potassium: 2.9 mmol/L — ABNORMAL LOW (ref 3.5–5.1)
Sodium: 140 mmol/L (ref 135–145)
Total Bilirubin: 0.5 mg/dL (ref 0.3–1.2)
Total Protein: 7.4 g/dL (ref 6.5–8.1)

## 2019-12-28 ENCOUNTER — Other Ambulatory Visit: Payer: Self-pay | Admitting: Oncology

## 2019-12-28 ENCOUNTER — Telehealth: Payer: Self-pay | Admitting: *Deleted

## 2019-12-28 DIAGNOSIS — M25562 Pain in left knee: Secondary | ICD-10-CM | POA: Diagnosis not present

## 2019-12-28 DIAGNOSIS — M7541 Impingement syndrome of right shoulder: Secondary | ICD-10-CM | POA: Diagnosis not present

## 2019-12-28 DIAGNOSIS — Z96651 Presence of right artificial knee joint: Secondary | ICD-10-CM | POA: Diagnosis not present

## 2019-12-28 NOTE — Telephone Encounter (Signed)
Labs today show K 2.9. Called patient to see if she is taking potassium. States she takes KCL 20 meg , 2 tablets TID per Dr Edrick Oh instrustions. Labs faxed to Dr Hassell Done to make him aware.

## 2019-12-29 ENCOUNTER — Telehealth: Payer: Self-pay

## 2019-12-29 NOTE — Telephone Encounter (Signed)
This LPN called pt per Wilber Bihari, NP. Pt states she has to be careful about what she eats d/t diabetes. This LPN educated pt on food high in potassium that should not effect her glucose. Pt also states she is taking her K+ supplement as directed.

## 2019-12-29 NOTE — Telephone Encounter (Signed)
-----   Message from Gardenia Phlegm, NP sent at 12/28/2019 12:00 PM EST ----- Please look into the decreased potassium level of this patient.    Thank you.    Mendel Ryder ----- Message ----- From: Buel Ream, Lab In [place] Sent: 12/27/2019  10:16 AM EST To: Chauncey Cruel, MD

## 2020-01-03 ENCOUNTER — Other Ambulatory Visit: Payer: Self-pay

## 2020-01-03 ENCOUNTER — Ambulatory Visit
Admission: RE | Admit: 2020-01-03 | Discharge: 2020-01-03 | Disposition: A | Discharge status: Home / Self Care | Payer: PPO | Source: Ambulatory Visit | Attending: Oncology | Admitting: Oncology

## 2020-01-03 ENCOUNTER — Other Ambulatory Visit: Payer: Self-pay | Admitting: Oncology

## 2020-01-03 DIAGNOSIS — C50412 Malignant neoplasm of upper-outer quadrant of left female breast: Secondary | ICD-10-CM

## 2020-01-03 DIAGNOSIS — N6489 Other specified disorders of breast: Secondary | ICD-10-CM | POA: Diagnosis not present

## 2020-01-03 DIAGNOSIS — Z170 Estrogen receptor positive status [ER+]: Secondary | ICD-10-CM

## 2020-01-04 ENCOUNTER — Other Ambulatory Visit: Payer: Self-pay | Admitting: Physical Medicine and Rehabilitation

## 2020-01-04 DIAGNOSIS — M5416 Radiculopathy, lumbar region: Secondary | ICD-10-CM

## 2020-01-08 DIAGNOSIS — N183 Chronic kidney disease, stage 3 unspecified: Secondary | ICD-10-CM | POA: Diagnosis not present

## 2020-01-15 ENCOUNTER — Ambulatory Visit
Admission: RE | Admit: 2020-01-15 | Discharge: 2020-01-15 | Disposition: A | Discharge status: Home / Self Care | Payer: PPO | Source: Ambulatory Visit | Attending: Physical Medicine and Rehabilitation | Admitting: Physical Medicine and Rehabilitation

## 2020-01-15 ENCOUNTER — Other Ambulatory Visit: Payer: Self-pay

## 2020-01-15 DIAGNOSIS — M545 Low back pain, unspecified: Secondary | ICD-10-CM | POA: Diagnosis not present

## 2020-01-15 DIAGNOSIS — M5416 Radiculopathy, lumbar region: Secondary | ICD-10-CM

## 2020-01-15 MED ORDER — METHYLPREDNISOLONE ACETATE 40 MG/ML INJ SUSP (RADIOLOG
120.0000 mg | Freq: Once | INTRAMUSCULAR | Status: AC
  Administered 2020-01-15: 120 mg via EPIDURAL

## 2020-01-15 MED ORDER — IOPAMIDOL (ISOVUE-M 200) INJECTION 41%
1.0000 mL | Freq: Once | INTRAMUSCULAR | Status: AC
  Administered 2020-01-15: 1 mL via EPIDURAL

## 2020-01-15 NOTE — Discharge Instructions (Signed)

## 2020-01-25 DIAGNOSIS — D485 Neoplasm of uncertain behavior of skin: Secondary | ICD-10-CM | POA: Diagnosis not present

## 2020-01-25 DIAGNOSIS — L2084 Intrinsic (allergic) eczema: Secondary | ICD-10-CM | POA: Diagnosis not present

## 2020-01-25 DIAGNOSIS — L821 Other seborrheic keratosis: Secondary | ICD-10-CM | POA: Diagnosis not present

## 2020-01-25 DIAGNOSIS — B079 Viral wart, unspecified: Secondary | ICD-10-CM | POA: Diagnosis not present

## 2020-01-25 DIAGNOSIS — D225 Melanocytic nevi of trunk: Secondary | ICD-10-CM | POA: Diagnosis not present

## 2020-01-25 DIAGNOSIS — D2271 Melanocytic nevi of right lower limb, including hip: Secondary | ICD-10-CM | POA: Diagnosis not present

## 2020-01-25 DIAGNOSIS — D2272 Melanocytic nevi of left lower limb, including hip: Secondary | ICD-10-CM | POA: Diagnosis not present

## 2020-01-25 DIAGNOSIS — L578 Other skin changes due to chronic exposure to nonionizing radiation: Secondary | ICD-10-CM | POA: Diagnosis not present

## 2020-01-25 DIAGNOSIS — Z808 Family history of malignant neoplasm of other organs or systems: Secondary | ICD-10-CM | POA: Diagnosis not present

## 2020-01-25 DIAGNOSIS — Z85828 Personal history of other malignant neoplasm of skin: Secondary | ICD-10-CM | POA: Diagnosis not present

## 2020-02-16 DIAGNOSIS — E785 Hyperlipidemia, unspecified: Secondary | ICD-10-CM | POA: Diagnosis not present

## 2020-02-16 DIAGNOSIS — K219 Gastro-esophageal reflux disease without esophagitis: Secondary | ICD-10-CM | POA: Diagnosis not present

## 2020-02-16 DIAGNOSIS — I129 Hypertensive chronic kidney disease with stage 1 through stage 4 chronic kidney disease, or unspecified chronic kidney disease: Secondary | ICD-10-CM | POA: Diagnosis not present

## 2020-02-16 DIAGNOSIS — M1990 Unspecified osteoarthritis, unspecified site: Secondary | ICD-10-CM | POA: Diagnosis not present

## 2020-02-16 DIAGNOSIS — N183 Chronic kidney disease, stage 3 unspecified: Secondary | ICD-10-CM | POA: Diagnosis not present

## 2020-02-16 DIAGNOSIS — C50412 Malignant neoplasm of upper-outer quadrant of left female breast: Secondary | ICD-10-CM | POA: Diagnosis not present

## 2020-02-16 DIAGNOSIS — E1169 Type 2 diabetes mellitus with other specified complication: Secondary | ICD-10-CM | POA: Diagnosis not present

## 2020-02-16 DIAGNOSIS — E039 Hypothyroidism, unspecified: Secondary | ICD-10-CM | POA: Diagnosis not present

## 2020-02-16 DIAGNOSIS — F331 Major depressive disorder, recurrent, moderate: Secondary | ICD-10-CM | POA: Diagnosis not present

## 2020-03-19 ENCOUNTER — Other Ambulatory Visit: Payer: Self-pay

## 2020-03-19 ENCOUNTER — Inpatient Hospital Stay (HOSPITAL_COMMUNITY)
Admission: EM | Admit: 2020-03-19 | Discharge: 2020-03-22 | DRG: 964 | Disposition: A | Discharge status: Rehabilitation Facility | Payer: PPO | Attending: Neurosurgery | Admitting: Neurosurgery

## 2020-03-19 ENCOUNTER — Emergency Department (HOSPITAL_COMMUNITY): Payer: PPO

## 2020-03-19 ENCOUNTER — Encounter (HOSPITAL_COMMUNITY): Payer: Self-pay | Admitting: Emergency Medicine

## 2020-03-19 DIAGNOSIS — E785 Hyperlipidemia, unspecified: Secondary | ICD-10-CM | POA: Diagnosis not present

## 2020-03-19 DIAGNOSIS — S32029D Unspecified fracture of second lumbar vertebra, subsequent encounter for fracture with routine healing: Secondary | ICD-10-CM | POA: Diagnosis not present

## 2020-03-19 DIAGNOSIS — S32028A Other fracture of second lumbar vertebra, initial encounter for closed fracture: Secondary | ICD-10-CM | POA: Diagnosis not present

## 2020-03-19 DIAGNOSIS — S065X0A Traumatic subdural hemorrhage without loss of consciousness, initial encounter: Principal | ICD-10-CM | POA: Diagnosis present

## 2020-03-19 DIAGNOSIS — S61412A Laceration without foreign body of left hand, initial encounter: Secondary | ICD-10-CM | POA: Diagnosis not present

## 2020-03-19 DIAGNOSIS — R0781 Pleurodynia: Secondary | ICD-10-CM | POA: Diagnosis not present

## 2020-03-19 DIAGNOSIS — Z7984 Long term (current) use of oral hypoglycemic drugs: Secondary | ICD-10-CM | POA: Diagnosis not present

## 2020-03-19 DIAGNOSIS — Z90710 Acquired absence of both cervix and uterus: Secondary | ICD-10-CM

## 2020-03-19 DIAGNOSIS — F4310 Post-traumatic stress disorder, unspecified: Secondary | ICD-10-CM | POA: Diagnosis not present

## 2020-03-19 DIAGNOSIS — F4311 Post-traumatic stress disorder, acute: Secondary | ICD-10-CM | POA: Diagnosis not present

## 2020-03-19 DIAGNOSIS — M79642 Pain in left hand: Secondary | ICD-10-CM | POA: Diagnosis not present

## 2020-03-19 DIAGNOSIS — Z8249 Family history of ischemic heart disease and other diseases of the circulatory system: Secondary | ICD-10-CM

## 2020-03-19 DIAGNOSIS — M545 Low back pain, unspecified: Secondary | ICD-10-CM

## 2020-03-19 DIAGNOSIS — F32A Depression, unspecified: Secondary | ICD-10-CM | POA: Diagnosis not present

## 2020-03-19 DIAGNOSIS — Z888 Allergy status to other drugs, medicaments and biological substances status: Secondary | ICD-10-CM | POA: Diagnosis not present

## 2020-03-19 DIAGNOSIS — E039 Hypothyroidism, unspecified: Secondary | ICD-10-CM | POA: Diagnosis present

## 2020-03-19 DIAGNOSIS — Z20822 Contact with and (suspected) exposure to covid-19: Secondary | ICD-10-CM | POA: Diagnosis present

## 2020-03-19 DIAGNOSIS — S069X1S Unspecified intracranial injury with loss of consciousness of 30 minutes or less, sequela: Secondary | ICD-10-CM | POA: Diagnosis not present

## 2020-03-19 DIAGNOSIS — Z041 Encounter for examination and observation following transport accident: Secondary | ICD-10-CM | POA: Diagnosis not present

## 2020-03-19 DIAGNOSIS — S069X0A Unspecified intracranial injury without loss of consciousness, initial encounter: Secondary | ICD-10-CM | POA: Diagnosis not present

## 2020-03-19 DIAGNOSIS — S61012A Laceration without foreign body of left thumb without damage to nail, initial encounter: Secondary | ICD-10-CM | POA: Diagnosis present

## 2020-03-19 DIAGNOSIS — S065X9A Traumatic subdural hemorrhage with loss of consciousness of unspecified duration, initial encounter: Secondary | ICD-10-CM | POA: Diagnosis not present

## 2020-03-19 DIAGNOSIS — Z79899 Other long term (current) drug therapy: Secondary | ICD-10-CM

## 2020-03-19 DIAGNOSIS — Z9889 Other specified postprocedural states: Secondary | ICD-10-CM | POA: Diagnosis not present

## 2020-03-19 DIAGNOSIS — S32411S Displaced fracture of anterior wall of right acetabulum, sequela: Secondary | ICD-10-CM | POA: Diagnosis not present

## 2020-03-19 DIAGNOSIS — S32029A Unspecified fracture of second lumbar vertebra, initial encounter for closed fracture: Secondary | ICD-10-CM | POA: Diagnosis not present

## 2020-03-19 DIAGNOSIS — E1165 Type 2 diabetes mellitus with hyperglycemia: Secondary | ICD-10-CM | POA: Diagnosis not present

## 2020-03-19 DIAGNOSIS — R000 Tachycardia, unspecified: Secondary | ICD-10-CM | POA: Diagnosis not present

## 2020-03-19 DIAGNOSIS — Z803 Family history of malignant neoplasm of breast: Secondary | ICD-10-CM

## 2020-03-19 DIAGNOSIS — D72829 Elevated white blood cell count, unspecified: Secondary | ICD-10-CM | POA: Diagnosis not present

## 2020-03-19 DIAGNOSIS — Z96651 Presence of right artificial knee joint: Secondary | ICD-10-CM | POA: Diagnosis not present

## 2020-03-19 DIAGNOSIS — S32049D Unspecified fracture of fourth lumbar vertebra, subsequent encounter for fracture with routine healing: Secondary | ICD-10-CM | POA: Diagnosis not present

## 2020-03-19 DIAGNOSIS — Z6830 Body mass index (BMI) 30.0-30.9, adult: Secondary | ICD-10-CM

## 2020-03-19 DIAGNOSIS — Z820 Family history of epilepsy and other diseases of the nervous system: Secondary | ICD-10-CM | POA: Diagnosis not present

## 2020-03-19 DIAGNOSIS — N179 Acute kidney failure, unspecified: Secondary | ICD-10-CM | POA: Diagnosis not present

## 2020-03-19 DIAGNOSIS — S32810A Multiple fractures of pelvis with stable disruption of pelvic ring, initial encounter for closed fracture: Secondary | ICD-10-CM

## 2020-03-19 DIAGNOSIS — S32411A Displaced fracture of anterior wall of right acetabulum, initial encounter for closed fracture: Secondary | ICD-10-CM | POA: Diagnosis present

## 2020-03-19 DIAGNOSIS — S32009A Unspecified fracture of unspecified lumbar vertebra, initial encounter for closed fracture: Secondary | ICD-10-CM

## 2020-03-19 DIAGNOSIS — K219 Gastro-esophageal reflux disease without esophagitis: Secondary | ICD-10-CM | POA: Diagnosis not present

## 2020-03-19 DIAGNOSIS — Z9981 Dependence on supplemental oxygen: Secondary | ICD-10-CM

## 2020-03-19 DIAGNOSIS — I10 Essential (primary) hypertension: Secondary | ICD-10-CM | POA: Diagnosis not present

## 2020-03-19 DIAGNOSIS — E876 Hypokalemia: Secondary | ICD-10-CM | POA: Diagnosis not present

## 2020-03-19 DIAGNOSIS — S32414A Nondisplaced fracture of anterior wall of right acetabulum, initial encounter for closed fracture: Secondary | ICD-10-CM | POA: Diagnosis not present

## 2020-03-19 DIAGNOSIS — Z9049 Acquired absence of other specified parts of digestive tract: Secondary | ICD-10-CM

## 2020-03-19 DIAGNOSIS — Z87828 Personal history of other (healed) physical injury and trauma: Secondary | ICD-10-CM | POA: Diagnosis not present

## 2020-03-19 DIAGNOSIS — S32110A Nondisplaced Zone I fracture of sacrum, initial encounter for closed fracture: Secondary | ICD-10-CM | POA: Diagnosis not present

## 2020-03-19 DIAGNOSIS — S32411D Displaced fracture of anterior wall of right acetabulum, subsequent encounter for fracture with routine healing: Secondary | ICD-10-CM | POA: Diagnosis not present

## 2020-03-19 DIAGNOSIS — Z923 Personal history of irradiation: Secondary | ICD-10-CM | POA: Diagnosis not present

## 2020-03-19 DIAGNOSIS — S32018A Other fracture of first lumbar vertebra, initial encounter for closed fracture: Secondary | ICD-10-CM | POA: Diagnosis not present

## 2020-03-19 DIAGNOSIS — S32019A Unspecified fracture of first lumbar vertebra, initial encounter for closed fracture: Secondary | ICD-10-CM | POA: Diagnosis present

## 2020-03-19 DIAGNOSIS — S069X9A Unspecified intracranial injury with loss of consciousness of unspecified duration, initial encounter: Secondary | ICD-10-CM

## 2020-03-19 DIAGNOSIS — M79604 Pain in right leg: Secondary | ICD-10-CM | POA: Diagnosis not present

## 2020-03-19 DIAGNOSIS — S32049A Unspecified fracture of fourth lumbar vertebra, initial encounter for closed fracture: Secondary | ICD-10-CM | POA: Diagnosis present

## 2020-03-19 DIAGNOSIS — S32019D Unspecified fracture of first lumbar vertebra, subsequent encounter for fracture with routine healing: Secondary | ICD-10-CM | POA: Diagnosis not present

## 2020-03-19 DIAGNOSIS — S32811A Multiple fractures of pelvis with unstable disruption of pelvic ring, initial encounter for closed fracture: Secondary | ICD-10-CM | POA: Diagnosis present

## 2020-03-19 DIAGNOSIS — R0902 Hypoxemia: Secondary | ICD-10-CM | POA: Diagnosis not present

## 2020-03-19 DIAGNOSIS — E669 Obesity, unspecified: Secondary | ICD-10-CM | POA: Diagnosis present

## 2020-03-19 DIAGNOSIS — Z79890 Hormone replacement therapy (postmenopausal): Secondary | ICD-10-CM

## 2020-03-19 DIAGNOSIS — S3210XD Unspecified fracture of sacrum, subsequent encounter for fracture with routine healing: Secondary | ICD-10-CM | POA: Diagnosis not present

## 2020-03-19 DIAGNOSIS — S32591D Other specified fracture of right pubis, subsequent encounter for fracture with routine healing: Secondary | ICD-10-CM | POA: Diagnosis not present

## 2020-03-19 DIAGNOSIS — R7989 Other specified abnormal findings of blood chemistry: Secondary | ICD-10-CM | POA: Diagnosis not present

## 2020-03-19 DIAGNOSIS — I6200 Nontraumatic subdural hemorrhage, unspecified: Secondary | ICD-10-CM | POA: Diagnosis not present

## 2020-03-19 DIAGNOSIS — S065XAA Traumatic subdural hemorrhage with loss of consciousness status unknown, initial encounter: Secondary | ICD-10-CM | POA: Diagnosis present

## 2020-03-19 DIAGNOSIS — M79605 Pain in left leg: Secondary | ICD-10-CM | POA: Diagnosis not present

## 2020-03-19 DIAGNOSIS — Z853 Personal history of malignant neoplasm of breast: Secondary | ICD-10-CM | POA: Diagnosis not present

## 2020-03-19 DIAGNOSIS — M1990 Unspecified osteoarthritis, unspecified site: Secondary | ICD-10-CM | POA: Diagnosis present

## 2020-03-19 DIAGNOSIS — Z800 Family history of malignant neoplasm of digestive organs: Secondary | ICD-10-CM | POA: Diagnosis not present

## 2020-03-19 DIAGNOSIS — Z886 Allergy status to analgesic agent status: Secondary | ICD-10-CM

## 2020-03-19 DIAGNOSIS — Z885 Allergy status to narcotic agent status: Secondary | ICD-10-CM | POA: Diagnosis not present

## 2020-03-19 DIAGNOSIS — D62 Acute posthemorrhagic anemia: Secondary | ICD-10-CM | POA: Diagnosis not present

## 2020-03-19 DIAGNOSIS — M7989 Other specified soft tissue disorders: Secondary | ICD-10-CM | POA: Diagnosis not present

## 2020-03-19 DIAGNOSIS — G8929 Other chronic pain: Secondary | ICD-10-CM | POA: Diagnosis not present

## 2020-03-19 DIAGNOSIS — S32511A Fracture of superior rim of right pubis, initial encounter for closed fracture: Secondary | ICD-10-CM | POA: Diagnosis not present

## 2020-03-19 DIAGNOSIS — S3219XA Other fracture of sacrum, initial encounter for closed fracture: Secondary | ICD-10-CM | POA: Diagnosis not present

## 2020-03-19 DIAGNOSIS — S32501A Unspecified fracture of right pubis, initial encounter for closed fracture: Secondary | ICD-10-CM | POA: Diagnosis not present

## 2020-03-19 DIAGNOSIS — S069XAA Unspecified intracranial injury with loss of consciousness status unknown, initial encounter: Secondary | ICD-10-CM

## 2020-03-19 DIAGNOSIS — S069X0S Unspecified intracranial injury without loss of consciousness, sequela: Secondary | ICD-10-CM | POA: Diagnosis not present

## 2020-03-19 DIAGNOSIS — S065X9D Traumatic subdural hemorrhage with loss of consciousness of unspecified duration, subsequent encounter: Secondary | ICD-10-CM | POA: Diagnosis not present

## 2020-03-19 DIAGNOSIS — S069X0D Unspecified intracranial injury without loss of consciousness, subsequent encounter: Secondary | ICD-10-CM | POA: Diagnosis not present

## 2020-03-19 LAB — COMPREHENSIVE METABOLIC PANEL
ALT: 142 U/L — ABNORMAL HIGH (ref 0–44)
AST: 201 U/L — ABNORMAL HIGH (ref 15–41)
Albumin: 3.3 g/dL — ABNORMAL LOW (ref 3.5–5.0)
Alkaline Phosphatase: 88 U/L (ref 38–126)
Anion gap: 14 (ref 5–15)
BUN: 18 mg/dL (ref 8–23)
CO2: 25 mmol/L (ref 22–32)
Calcium: 8.9 mg/dL (ref 8.9–10.3)
Chloride: 96 mmol/L — ABNORMAL LOW (ref 98–111)
Creatinine, Ser: 1.37 mg/dL — ABNORMAL HIGH (ref 0.44–1.00)
GFR, Estimated: 40 mL/min — ABNORMAL LOW (ref >60)
Glucose, Bld: 301 mg/dL — ABNORMAL HIGH (ref 70–99)
Potassium: 2.8 mmol/L — ABNORMAL LOW (ref 3.5–5.1)
Sodium: 135 mmol/L (ref 135–145)
Total Bilirubin: 0.8 mg/dL (ref 0.3–1.2)
Total Protein: 6.1 g/dL — ABNORMAL LOW (ref 6.5–8.1)

## 2020-03-19 LAB — MRSA PCR SCREENING: MRSA by PCR: NEGATIVE

## 2020-03-19 LAB — CBC WITH DIFFERENTIAL/PLATELET
Abs Immature Granulocytes: 0.2 10*3/uL — ABNORMAL HIGH (ref 0.00–0.07)
Basophils Absolute: 0 10*3/uL (ref 0.0–0.1)
Basophils Relative: 0 %
Eosinophils Absolute: 0 10*3/uL (ref 0.0–0.5)
Eosinophils Relative: 0 %
HCT: 38.1 % (ref 36.0–46.0)
Hemoglobin: 12.5 g/dL (ref 12.0–15.0)
Immature Granulocytes: 1 %
Lymphocytes Relative: 5 %
Lymphs Abs: 0.8 10*3/uL (ref 0.7–4.0)
MCH: 28.2 pg (ref 26.0–34.0)
MCHC: 32.8 g/dL (ref 30.0–36.0)
MCV: 85.8 fL (ref 80.0–100.0)
Monocytes Absolute: 0.8 10*3/uL (ref 0.1–1.0)
Monocytes Relative: 5 %
Neutro Abs: 13.4 10*3/uL — ABNORMAL HIGH (ref 1.7–7.7)
Neutrophils Relative %: 89 %
Platelets: 203 10*3/uL (ref 150–400)
RBC: 4.44 MIL/uL (ref 3.87–5.11)
RDW: 13.5 % (ref 11.5–15.5)
WBC: 15.2 10*3/uL — ABNORMAL HIGH (ref 4.0–10.5)
nRBC: 0 % (ref 0.0–0.2)

## 2020-03-19 LAB — RESP PANEL BY RT-PCR (FLU A&B, COVID) ARPGX2
Influenza A by PCR: NEGATIVE
Influenza B by PCR: NEGATIVE
SARS Coronavirus 2 by RT PCR: NEGATIVE

## 2020-03-19 LAB — TYPE AND SCREEN
ABO/RH(D): O POS
Antibody Screen: NEGATIVE

## 2020-03-19 LAB — ETHANOL: Alcohol, Ethyl (B): <10 mg/dL (ref <10)

## 2020-03-19 LAB — PROTIME-INR
INR: 1.1 (ref 0.8–1.2)
Prothrombin Time: 13.8 seconds (ref 11.4–15.2)

## 2020-03-19 LAB — LACTIC ACID, PLASMA: Lactic Acid, Venous: 2.5 mmol/L (ref 0.5–1.9)

## 2020-03-19 LAB — LIPASE, BLOOD: Lipase: 35 U/L (ref 11–51)

## 2020-03-19 MED ORDER — SODIUM CHLORIDE 0.9 % IV BOLUS: 125.0000 mL | Freq: Once | INTRAVENOUS | Status: DC

## 2020-03-19 MED ORDER — METOPROLOL TARTRATE 5 MG/5ML IV SOLN
5.0000 mg | Freq: Four times a day (QID) | INTRAVENOUS | Status: DC | PRN
  Administered 2020-03-20: 5 mg via INTRAVENOUS
  Filled 2020-03-19: qty 5

## 2020-03-19 MED ORDER — SODIUM CHLORIDE 0.9 % IV SOLN: INTRAVENOUS | Status: DC

## 2020-03-19 MED ORDER — IOHEXOL 300 MG/ML  SOLN
100.0000 mL | Freq: Once | INTRAMUSCULAR | Status: AC | PRN
  Administered 2020-03-19: 100 mL via INTRAVENOUS

## 2020-03-19 MED ORDER — LORAZEPAM 2 MG/ML IJ SOLN
1.0000 mg | Freq: Once | INTRAMUSCULAR | Status: AC
  Administered 2020-03-19: 1 mg via INTRAVENOUS
  Filled 2020-03-19: qty 1

## 2020-03-19 MED ORDER — HYDROMORPHONE HCL 1 MG/ML IJ SOLN
0.5000 mg | INTRAMUSCULAR | Status: DC | PRN
  Administered 2020-03-19 – 2020-03-20 (×9): 0.5 mg via INTRAVENOUS
  Filled 2020-03-19 (×9): qty 1

## 2020-03-19 MED ORDER — ONDANSETRON HCL 4 MG/2ML IJ SOLN: 4.0000 mg | Freq: Four times a day (QID) | INTRAMUSCULAR | Status: DC | PRN

## 2020-03-19 MED ORDER — CHLORHEXIDINE GLUCONATE CLOTH 2 % EX PADS
6.0000 | MEDICATED_PAD | Freq: Every day | CUTANEOUS | Status: DC
  Administered 2020-03-19 – 2020-03-20 (×2): 6 via TOPICAL

## 2020-03-19 MED ORDER — HYDROMORPHONE HCL 1 MG/ML IJ SOLN
0.5000 mg | Freq: Once | INTRAMUSCULAR | Status: AC
  Administered 2020-03-19: 0.5 mg via INTRAVENOUS
  Filled 2020-03-19: qty 1

## 2020-03-19 MED ORDER — PANTOPRAZOLE SODIUM 40 MG IV SOLR
40.0000 mg | Freq: Every day | INTRAVENOUS | Status: DC
  Administered 2020-03-19: 40 mg via INTRAVENOUS
  Filled 2020-03-19: qty 40

## 2020-03-19 MED ORDER — HYDROCODONE-ACETAMINOPHEN 5-325 MG PO TABS
1.0000 | ORAL_TABLET | ORAL | Status: DC | PRN
  Administered 2020-03-20 – 2020-03-22 (×9): 1 via ORAL
  Filled 2020-03-19 (×9): qty 1

## 2020-03-19 MED ORDER — PANTOPRAZOLE SODIUM 40 MG PO TBEC
40.0000 mg | DELAYED_RELEASE_TABLET | Freq: Every day | ORAL | Status: DC
  Administered 2020-03-20 – 2020-03-22 (×3): 40 mg via ORAL
  Filled 2020-03-19 (×3): qty 1

## 2020-03-19 MED ORDER — ACETAMINOPHEN 325 MG PO TABS: 650.0000 mg | ORAL_TABLET | ORAL | Status: DC | PRN

## 2020-03-19 MED ORDER — LIDOCAINE-EPINEPHRINE 1 %-1:100000 IJ SOLN
20.0000 mL | Freq: Once | INTRAMUSCULAR | Status: AC
  Administered 2020-03-19: 20 mL
  Filled 2020-03-19: qty 1

## 2020-03-19 MED ORDER — ONDANSETRON 4 MG PO TBDP: 4.0000 mg | ORAL_TABLET | Freq: Four times a day (QID) | ORAL | Status: DC | PRN
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
## 2020-03-19 NOTE — Progress Notes (Signed)
I have reviewed imaging on ramus fracture.  Nondisplaced on the right side.  May WBAT BLE.  Formal consult tomorrow.
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
## 2020-03-19 NOTE — Progress Notes (Signed)
Date and time results received: 03/19/20 2045  Test: lactic acid  Critical Value: 2.5  Name of Provider Notified: Dr. Annette Stable  Orders Received? Or Actions Taken?: MD notified. No new orders.
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
## 2020-03-20 ENCOUNTER — Inpatient Hospital Stay (HOSPITAL_COMMUNITY): Payer: PPO

## 2020-03-20 DIAGNOSIS — I10 Essential (primary) hypertension: Secondary | ICD-10-CM

## 2020-03-20 DIAGNOSIS — D72829 Elevated white blood cell count, unspecified: Secondary | ICD-10-CM

## 2020-03-20 DIAGNOSIS — R000 Tachycardia, unspecified: Secondary | ICD-10-CM

## 2020-03-20 DIAGNOSIS — S069X0A Unspecified intracranial injury without loss of consciousness, initial encounter: Secondary | ICD-10-CM | POA: Diagnosis not present

## 2020-03-20 DIAGNOSIS — E1165 Type 2 diabetes mellitus with hyperglycemia: Secondary | ICD-10-CM

## 2020-03-20 DIAGNOSIS — S065X9A Traumatic subdural hemorrhage with loss of consciousness of unspecified duration, initial encounter: Secondary | ICD-10-CM

## 2020-03-20 DIAGNOSIS — S32009A Unspecified fracture of unspecified lumbar vertebra, initial encounter for closed fracture: Secondary | ICD-10-CM

## 2020-03-20 DIAGNOSIS — S069XAA Unspecified intracranial injury with loss of consciousness status unknown, initial encounter: Secondary | ICD-10-CM

## 2020-03-20 DIAGNOSIS — S32810A Multiple fractures of pelvis with stable disruption of pelvic ring, initial encounter for closed fracture: Secondary | ICD-10-CM | POA: Diagnosis not present

## 2020-03-20 DIAGNOSIS — M79642 Pain in left hand: Secondary | ICD-10-CM

## 2020-03-20 DIAGNOSIS — Z9981 Dependence on supplemental oxygen: Secondary | ICD-10-CM

## 2020-03-20 DIAGNOSIS — S069X9A Unspecified intracranial injury with loss of consciousness of unspecified duration, initial encounter: Secondary | ICD-10-CM

## 2020-03-20 DIAGNOSIS — G8929 Other chronic pain: Secondary | ICD-10-CM

## 2020-03-20 DIAGNOSIS — S065XAA Traumatic subdural hemorrhage with loss of consciousness status unknown, initial encounter: Secondary | ICD-10-CM

## 2020-03-20 DIAGNOSIS — M545 Low back pain, unspecified: Secondary | ICD-10-CM

## 2020-03-20 DIAGNOSIS — E876 Hypokalemia: Secondary | ICD-10-CM

## 2020-03-20 LAB — BASIC METABOLIC PANEL
Anion gap: 13 (ref 5–15)
BUN: 15 mg/dL (ref 8–23)
CO2: 27 mmol/L (ref 22–32)
Calcium: 8.8 mg/dL — ABNORMAL LOW (ref 8.9–10.3)
Chloride: 101 mmol/L (ref 98–111)
Creatinine, Ser: 1.15 mg/dL — ABNORMAL HIGH (ref 0.44–1.00)
GFR, Estimated: 49 mL/min — ABNORMAL LOW (ref >60)
Glucose, Bld: 178 mg/dL — ABNORMAL HIGH (ref 70–99)
Potassium: 2.8 mmol/L — ABNORMAL LOW (ref 3.5–5.1)
Sodium: 141 mmol/L (ref 135–145)

## 2020-03-20 LAB — CBC
HCT: 33 % — ABNORMAL LOW (ref 36.0–46.0)
Hemoglobin: 10.8 g/dL — ABNORMAL LOW (ref 12.0–15.0)
MCH: 28.3 pg (ref 26.0–34.0)
MCHC: 32.7 g/dL (ref 30.0–36.0)
MCV: 86.4 fL (ref 80.0–100.0)
Platelets: 174 10*3/uL (ref 150–400)
RBC: 3.82 MIL/uL — ABNORMAL LOW (ref 3.87–5.11)
RDW: 13.7 % (ref 11.5–15.5)
WBC: 10.9 10*3/uL — ABNORMAL HIGH (ref 4.0–10.5)
nRBC: 0 % (ref 0.0–0.2)

## 2020-03-20 MED ORDER — POTASSIUM CHLORIDE CRYS ER 20 MEQ PO TBCR
40.0000 meq | EXTENDED_RELEASE_TABLET | ORAL | Status: AC
  Administered 2020-03-20 (×2): 40 meq via ORAL
  Filled 2020-03-20 (×2): qty 2

## 2020-03-20 MED ORDER — DIPHENHYDRAMINE HCL 25 MG PO CAPS
25.0000 mg | ORAL_CAPSULE | Freq: Four times a day (QID) | ORAL | Status: DC | PRN
  Administered 2020-03-20 – 2020-03-22 (×6): 25 mg via ORAL
  Filled 2020-03-20 (×6): qty 1
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
## 2020-03-20 NOTE — Progress Notes (Signed)
Stable overnight.  No new issues or problems.  Patient continues to complain of right chest wall and flank discomfort.  Also with significant right hip pain.  No headache.  She is awake and alert.  She is oriented and appropriate.  Motor and sensory function are intact.  Cranial nerve function normal bilaterally.  Abdomen soft.  Chest clear.  Follow-up head CT scan with stable appearance for small right convexity subdural hematoma without mass-effect.  Orthopedic consult pending.  Patient may be mobilized with therapy.
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
## 2020-03-20 NOTE — Progress Notes (Signed)
Inpatient Diabetes Program Recommendations  AACE/ADA: New Consensus Statement on Inpatient Glycemic Control (2015)  Target Ranges:  Prepandial:   less than 140 mg/dL      Peak postprandial:   less than 180 mg/dL (1-2 hours)      Critically ill patients:  140 - 180 mg/dL   No results found for: GLUCAP, HGBA1C  Review of Glycemic Control Results for [name], [name] (MRN [id number]) as of 03/20/2020 13:01  Ref. Range 03/19/2020 19:45 03/20/2020 03:55  Glucose Latest Ref Range: 70 - 99 mg/dL 301 (H) 178 (H)   Diabetes history: DM2  Outpatient Diabetes medications: Amaryl 1 mg BID  Inpatient Diabetes Program Recommendations:    Please check CBG ac/hs.  If CBG's > 180 mg/dL, please consider Glycemic Control Order Set Novolog 0-9 units TID.  Will continue to follow while inpatient.  Thank you, Reche Dixon, RN, BSN Diabetes Coordinator Inpatient Diabetes Program [phone] (team pager from 8a-5p)

## 2020-03-20 NOTE — Progress Notes (Addendum)
Rehab Admissions Coordinator Note:  Patient was screened by Cleatrice Burke for appropriateness for an Inpatient Acute Rehab Consult.  At this time, we are recommending Inpatient Rehab consult. I will place order per protocol.  Cleatrice Burke RN MSN 03/20/2020, 11:59 AM  I can be reached at [phone].
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
## 2020-03-21 LAB — BASIC METABOLIC PANEL
Anion gap: 10 (ref 5–15)
BUN: 10 mg/dL (ref 8–23)
CO2: 23 mmol/L (ref 22–32)
Calcium: 8.2 mg/dL — ABNORMAL LOW (ref 8.9–10.3)
Chloride: 103 mmol/L (ref 98–111)
Creatinine, Ser: 1.18 mg/dL — ABNORMAL HIGH (ref 0.44–1.00)
GFR, Estimated: 48 mL/min — ABNORMAL LOW (ref >60)
Glucose, Bld: 150 mg/dL — ABNORMAL HIGH (ref 70–99)
Potassium: 3.9 mmol/L (ref 3.5–5.1)
Sodium: 136 mmol/L (ref 135–145)

## 2020-03-21 MED ORDER — METOPROLOL SUCCINATE ER 50 MG PO TB24
50.0000 mg | ORAL_TABLET | Freq: Every day | ORAL | Status: DC
  Administered 2020-03-21 – 2020-03-22 (×2): 50 mg via ORAL
  Filled 2020-03-21 (×2): qty 1

## 2020-03-21 MED ORDER — ESCITALOPRAM OXALATE 10 MG PO TABS
10.0000 mg | ORAL_TABLET | Freq: Every day | ORAL | Status: DC
  Administered 2020-03-21 – 2020-03-22 (×2): 10 mg via ORAL
  Filled 2020-03-21 (×2): qty 1

## 2020-03-21 MED ORDER — LEVOTHYROXINE SODIUM 100 MCG PO TABS
100.0000 ug | ORAL_TABLET | Freq: Every day | ORAL | Status: DC
  Administered 2020-03-22: 100 ug via ORAL
  Filled 2020-03-21: qty 1

## 2020-03-21 MED ORDER — ENALAPRIL MALEATE 5 MG PO TABS
10.0000 mg | ORAL_TABLET | Freq: Every day | ORAL | Status: DC
  Administered 2020-03-21 – 2020-03-22 (×2): 10 mg via ORAL
  Filled 2020-03-21 (×2): qty 2

## 2020-03-21 MED ORDER — AMLODIPINE BESYLATE 10 MG PO TABS
10.0000 mg | ORAL_TABLET | Freq: Every day | ORAL | Status: DC
  Administered 2020-03-21 – 2020-03-22 (×2): 10 mg via ORAL
  Filled 2020-03-21 (×2): qty 1

## 2020-03-21 MED ORDER — ROSUVASTATIN CALCIUM 5 MG PO TABS
5.0000 mg | ORAL_TABLET | Freq: Every day | ORAL | Status: DC
  Administered 2020-03-21 – 2020-03-22 (×2): 5 mg via ORAL
  Filled 2020-03-21 (×2): qty 1

## 2020-03-21 MED ORDER — POTASSIUM CHLORIDE CRYS ER 20 MEQ PO TBCR
40.0000 meq | EXTENDED_RELEASE_TABLET | Freq: Three times a day (TID) | ORAL | Status: DC
  Administered 2020-03-21 – 2020-03-22 (×4): 40 meq via ORAL
  Filled 2020-03-21 (×4): qty 2

## 2020-03-21 MED ORDER — GLIMEPIRIDE 2 MG PO TABS
1.0000 mg | ORAL_TABLET | Freq: Two times a day (BID) | ORAL | Status: DC
  Administered 2020-03-21 – 2020-03-22 (×2): 1 mg via ORAL
  Filled 2020-03-21 (×2): qty 1

## 2020-03-21 MED ORDER — HYDROCHLOROTHIAZIDE 12.5 MG PO CAPS
12.5000 mg | ORAL_CAPSULE | Freq: Every day | ORAL | Status: DC
  Administered 2020-03-21 – 2020-03-22 (×2): 12.5 mg via ORAL
  Filled 2020-03-21 (×2): qty 1

## 2020-03-21 MED ORDER — VITAMIN D 25 MCG (1000 UNIT) PO TABS
5000.0000 [IU] | ORAL_TABLET | Freq: Every day | ORAL | Status: DC
  Administered 2020-03-21 – 2020-03-22 (×2): 5000 [IU] via ORAL
  Filled 2020-03-21 (×2): qty 5

## 2020-03-21 NOTE — Progress Notes (Signed)
Overall stable.  No headaches or other problems.  Pelvic pain controlled.  Mobilizing slowly.  Overall progressing reasonably well.  Continue efforts at mobilization.  Patient felt to be a good candidate for inpatient rehabilitation.  Patient stable for discharge whenever bed available.
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
## 2020-03-21 NOTE — Progress Notes (Signed)
Inpatient Rehab Admissions Coordinator:   Met with patient and her spouse, Ray, at the bedside.  Discussed recommendations for CIR and that expectations would be supervision to mod I goals with ELOS of about a week and a half or so.  I explained that length of stay was based on progress, and that we would not keep a patient admitted longer than they had to be (pt has a goal of seeing the [facility] on 3/6 at the [facility]). Ray is home with the patient during the day and can provide supervision if needed.  We discussed that I would need insurance authorization, and I will start this process today.   Shann Medal, PT, DPT Admissions Coordinator [phone] 03/21/20  12:23 PM

## 2020-03-22 ENCOUNTER — Other Ambulatory Visit: Payer: Self-pay

## 2020-03-22 ENCOUNTER — Encounter (HOSPITAL_COMMUNITY): Payer: Self-pay | Admitting: Physical Medicine & Rehabilitation

## 2020-03-22 ENCOUNTER — Inpatient Hospital Stay (HOSPITAL_COMMUNITY)
Admission: RE | Admit: 2020-03-22 | Discharge: 2020-04-02 | DRG: 945 | Disposition: A | Discharge status: Home / Self Care | Payer: PPO | Source: Intra-hospital | Attending: Physical Medicine & Rehabilitation | Admitting: Physical Medicine & Rehabilitation

## 2020-03-22 DIAGNOSIS — S32411S Displaced fracture of anterior wall of right acetabulum, sequela: Secondary | ICD-10-CM | POA: Diagnosis not present

## 2020-03-22 DIAGNOSIS — Z96651 Presence of right artificial knee joint: Secondary | ICD-10-CM | POA: Diagnosis present

## 2020-03-22 DIAGNOSIS — E039 Hypothyroidism, unspecified: Secondary | ICD-10-CM | POA: Diagnosis present

## 2020-03-22 DIAGNOSIS — Z9889 Other specified postprocedural states: Secondary | ICD-10-CM | POA: Diagnosis not present

## 2020-03-22 DIAGNOSIS — N179 Acute kidney failure, unspecified: Secondary | ICD-10-CM | POA: Diagnosis present

## 2020-03-22 DIAGNOSIS — S61412A Laceration without foreign body of left hand, initial encounter: Secondary | ICD-10-CM | POA: Diagnosis not present

## 2020-03-22 DIAGNOSIS — F4310 Post-traumatic stress disorder, unspecified: Secondary | ICD-10-CM | POA: Diagnosis not present

## 2020-03-22 DIAGNOSIS — Z90710 Acquired absence of both cervix and uterus: Secondary | ICD-10-CM

## 2020-03-22 DIAGNOSIS — Z800 Family history of malignant neoplasm of digestive organs: Secondary | ICD-10-CM

## 2020-03-22 DIAGNOSIS — Z7984 Long term (current) use of oral hypoglycemic drugs: Secondary | ICD-10-CM | POA: Diagnosis not present

## 2020-03-22 DIAGNOSIS — Z853 Personal history of malignant neoplasm of breast: Secondary | ICD-10-CM

## 2020-03-22 DIAGNOSIS — R7989 Other specified abnormal findings of blood chemistry: Secondary | ICD-10-CM | POA: Diagnosis not present

## 2020-03-22 DIAGNOSIS — E1165 Type 2 diabetes mellitus with hyperglycemia: Secondary | ICD-10-CM | POA: Diagnosis present

## 2020-03-22 DIAGNOSIS — S32049D Unspecified fracture of fourth lumbar vertebra, subsequent encounter for fracture with routine healing: Secondary | ICD-10-CM

## 2020-03-22 DIAGNOSIS — G8929 Other chronic pain: Secondary | ICD-10-CM | POA: Diagnosis not present

## 2020-03-22 DIAGNOSIS — F4311 Post-traumatic stress disorder, acute: Secondary | ICD-10-CM | POA: Diagnosis present

## 2020-03-22 DIAGNOSIS — Z886 Allergy status to analgesic agent status: Secondary | ICD-10-CM

## 2020-03-22 DIAGNOSIS — Z79899 Other long term (current) drug therapy: Secondary | ICD-10-CM | POA: Diagnosis not present

## 2020-03-22 DIAGNOSIS — Z8249 Family history of ischemic heart disease and other diseases of the circulatory system: Secondary | ICD-10-CM | POA: Diagnosis not present

## 2020-03-22 DIAGNOSIS — D72829 Elevated white blood cell count, unspecified: Secondary | ICD-10-CM | POA: Diagnosis not present

## 2020-03-22 DIAGNOSIS — S065X9A Traumatic subdural hemorrhage with loss of consciousness of unspecified duration, initial encounter: Secondary | ICD-10-CM | POA: Diagnosis present

## 2020-03-22 DIAGNOSIS — Z885 Allergy status to narcotic agent status: Secondary | ICD-10-CM

## 2020-03-22 DIAGNOSIS — S32411D Displaced fracture of anterior wall of right acetabulum, subsequent encounter for fracture with routine healing: Secondary | ICD-10-CM | POA: Diagnosis not present

## 2020-03-22 DIAGNOSIS — K219 Gastro-esophageal reflux disease without esophagitis: Secondary | ICD-10-CM | POA: Diagnosis present

## 2020-03-22 DIAGNOSIS — Z820 Family history of epilepsy and other diseases of the nervous system: Secondary | ICD-10-CM

## 2020-03-22 DIAGNOSIS — M79604 Pain in right leg: Secondary | ICD-10-CM | POA: Diagnosis not present

## 2020-03-22 DIAGNOSIS — F32A Depression, unspecified: Secondary | ICD-10-CM | POA: Diagnosis not present

## 2020-03-22 DIAGNOSIS — D62 Acute posthemorrhagic anemia: Secondary | ICD-10-CM | POA: Diagnosis present

## 2020-03-22 DIAGNOSIS — S069X9A Unspecified intracranial injury with loss of consciousness of unspecified duration, initial encounter: Secondary | ICD-10-CM | POA: Diagnosis not present

## 2020-03-22 DIAGNOSIS — S069X0S Unspecified intracranial injury without loss of consciousness, sequela: Secondary | ICD-10-CM | POA: Diagnosis not present

## 2020-03-22 DIAGNOSIS — Z79890 Hormone replacement therapy (postmenopausal): Secondary | ICD-10-CM | POA: Diagnosis not present

## 2020-03-22 DIAGNOSIS — S32029D Unspecified fracture of second lumbar vertebra, subsequent encounter for fracture with routine healing: Secondary | ICD-10-CM | POA: Diagnosis not present

## 2020-03-22 DIAGNOSIS — S065X9D Traumatic subdural hemorrhage with loss of consciousness of unspecified duration, subsequent encounter: Secondary | ICD-10-CM | POA: Diagnosis not present

## 2020-03-22 DIAGNOSIS — R0902 Hypoxemia: Secondary | ICD-10-CM | POA: Diagnosis not present

## 2020-03-22 DIAGNOSIS — S069X0D Unspecified intracranial injury without loss of consciousness, subsequent encounter: Secondary | ICD-10-CM

## 2020-03-22 DIAGNOSIS — S3210XD Unspecified fracture of sacrum, subsequent encounter for fracture with routine healing: Secondary | ICD-10-CM | POA: Diagnosis not present

## 2020-03-22 DIAGNOSIS — I129 Hypertensive chronic kidney disease with stage 1 through stage 4 chronic kidney disease, or unspecified chronic kidney disease: Secondary | ICD-10-CM | POA: Diagnosis present

## 2020-03-22 DIAGNOSIS — S065XAA Traumatic subdural hemorrhage with loss of consciousness status unknown, initial encounter: Secondary | ICD-10-CM | POA: Diagnosis present

## 2020-03-22 DIAGNOSIS — E876 Hypokalemia: Secondary | ICD-10-CM | POA: Diagnosis present

## 2020-03-22 DIAGNOSIS — Z87442 Personal history of urinary calculi: Secondary | ICD-10-CM

## 2020-03-22 DIAGNOSIS — Z803 Family history of malignant neoplasm of breast: Secondary | ICD-10-CM | POA: Diagnosis not present

## 2020-03-22 DIAGNOSIS — S32591D Other specified fracture of right pubis, subsequent encounter for fracture with routine healing: Secondary | ICD-10-CM | POA: Diagnosis not present

## 2020-03-22 DIAGNOSIS — Z923 Personal history of irradiation: Secondary | ICD-10-CM

## 2020-03-22 DIAGNOSIS — Z9049 Acquired absence of other specified parts of digestive tract: Secondary | ICD-10-CM

## 2020-03-22 DIAGNOSIS — E1122 Type 2 diabetes mellitus with diabetic chronic kidney disease: Secondary | ICD-10-CM | POA: Diagnosis present

## 2020-03-22 DIAGNOSIS — M79605 Pain in left leg: Secondary | ICD-10-CM | POA: Diagnosis not present

## 2020-03-22 DIAGNOSIS — S32019D Unspecified fracture of first lumbar vertebra, subsequent encounter for fracture with routine healing: Secondary | ICD-10-CM | POA: Diagnosis not present

## 2020-03-22 DIAGNOSIS — I10 Essential (primary) hypertension: Secondary | ICD-10-CM | POA: Diagnosis present

## 2020-03-22 DIAGNOSIS — Z888 Allergy status to other drugs, medicaments and biological substances status: Secondary | ICD-10-CM

## 2020-03-22 DIAGNOSIS — S069XAA Unspecified intracranial injury with loss of consciousness status unknown, initial encounter: Secondary | ICD-10-CM | POA: Diagnosis present

## 2020-03-22 DIAGNOSIS — S069X1S Unspecified intracranial injury with loss of consciousness of 30 minutes or less, sequela: Secondary | ICD-10-CM | POA: Diagnosis not present

## 2020-03-22 DIAGNOSIS — G4700 Insomnia, unspecified: Secondary | ICD-10-CM | POA: Diagnosis present

## 2020-03-22 DIAGNOSIS — N189 Chronic kidney disease, unspecified: Secondary | ICD-10-CM | POA: Diagnosis present

## 2020-03-22 MED ORDER — FLEET ENEMA 7-19 GM/118ML RE ENEM
1.0000 | ENEMA | Freq: Once | RECTAL | Status: DC | PRN
Start: 2020-03-22 — End: 2020-04-02

## 2020-03-22 MED ORDER — PANTOPRAZOLE SODIUM 40 MG PO TBEC
40.0000 mg | DELAYED_RELEASE_TABLET | Freq: Every day | ORAL | Status: DC
  Administered 2020-03-23 – 2020-03-25 (×3): 40 mg via ORAL
  Filled 2020-03-22 (×10): qty 1

## 2020-03-22 MED ORDER — POTASSIUM CHLORIDE CRYS ER 20 MEQ PO TBCR
40.0000 meq | EXTENDED_RELEASE_TABLET | Freq: Three times a day (TID) | ORAL | Status: DC
  Administered 2020-03-23 – 2020-04-02 (×31): 40 meq via ORAL
  Filled 2020-03-22 (×34): qty 2

## 2020-03-22 MED ORDER — ESCITALOPRAM OXALATE 10 MG PO TABS
10.0000 mg | ORAL_TABLET | Freq: Every day | ORAL | Status: DC
  Administered 2020-03-23 – 2020-04-02 (×11): 10 mg via ORAL
  Filled 2020-03-22 (×11): qty 1

## 2020-03-22 MED ORDER — BISACODYL 10 MG RE SUPP
10.0000 mg | Freq: Every day | RECTAL | Status: DC | PRN
  Filled 2020-03-22: qty 1

## 2020-03-22 MED ORDER — VITAMIN D 25 MCG (1000 UNIT) PO TABS
5000.0000 [IU] | ORAL_TABLET | Freq: Every day | ORAL | Status: DC
  Administered 2020-03-23 – 2020-04-02 (×11): 5000 [IU] via ORAL
  Filled 2020-03-22 (×12): qty 5

## 2020-03-22 MED ORDER — HYDROCODONE-ACETAMINOPHEN 5-325 MG PO TABS
1.0000 | ORAL_TABLET | ORAL | Status: DC | PRN
Start: 2020-03-22 — End: 2020-04-02
  Administered 2020-03-22 – 2020-04-02 (×38): 1 via ORAL
  Filled 2020-03-22 (×39): qty 1

## 2020-03-22 MED ORDER — GLIMEPIRIDE 2 MG PO TABS
1.0000 mg | ORAL_TABLET | Freq: Two times a day (BID) | ORAL | Status: DC
  Administered 2020-03-22 – 2020-04-02 (×22): 1 mg via ORAL
  Filled 2020-03-22 (×22): qty 1

## 2020-03-22 MED ORDER — PANTOPRAZOLE SODIUM 40 MG IV SOLR
40.0000 mg | Freq: Every day | INTRAVENOUS | Status: DC
  Filled 2020-03-22: qty 40

## 2020-03-22 MED ORDER — LIDOCAINE 5 % EX PTCH
1.0000 | MEDICATED_PATCH | CUTANEOUS | Status: DC
  Administered 2020-03-23 – 2020-03-24 (×2): 1 via TRANSDERMAL
  Filled 2020-03-22 (×8): qty 1

## 2020-03-22 MED ORDER — METOPROLOL SUCCINATE ER 50 MG PO TB24
50.0000 mg | ORAL_TABLET | Freq: Every day | ORAL | Status: DC
  Administered 2020-03-23 – 2020-04-02 (×11): 50 mg via ORAL
  Filled 2020-03-22 (×11): qty 1

## 2020-03-22 MED ORDER — ALUM & MAG HYDROXIDE-SIMETH 200-200-20 MG/5ML PO SUSP: 30.0000 mL | ORAL | Status: DC | PRN

## 2020-03-22 MED ORDER — HYDROCHLOROTHIAZIDE 12.5 MG PO CAPS
12.5000 mg | ORAL_CAPSULE | Freq: Every day | ORAL | Status: DC
  Administered 2020-03-23 – 2020-03-25 (×3): 12.5 mg via ORAL
  Filled 2020-03-22 (×3): qty 1

## 2020-03-22 MED ORDER — LEVOTHYROXINE SODIUM 100 MCG PO TABS
100.0000 ug | ORAL_TABLET | Freq: Every day | ORAL | Status: DC
  Administered 2020-03-23 – 2020-04-02 (×11): 100 ug via ORAL
  Filled 2020-03-22 (×11): qty 1

## 2020-03-22 MED ORDER — ENSURE MAX PROTEIN PO LIQD
11.0000 [oz_av] | Freq: Two times a day (BID) | ORAL | Status: DC
  Administered 2020-03-22 – 2020-04-01 (×7): 11 [oz_av] via ORAL

## 2020-03-22 MED ORDER — POLYETHYLENE GLYCOL 3350 17 G PO PACK: 17.0000 g | PACK | Freq: Every day | ORAL | Status: DC | PRN

## 2020-03-22 MED ORDER — PROCHLORPERAZINE MALEATE 5 MG PO TABS
5.0000 mg | ORAL_TABLET | Freq: Four times a day (QID) | ORAL | Status: DC | PRN
  Administered 2020-03-28: 10 mg via ORAL
  Filled 2020-03-22: qty 2

## 2020-03-22 MED ORDER — TRAZODONE HCL 50 MG PO TABS
25.0000 mg | ORAL_TABLET | Freq: Every evening | ORAL | Status: DC | PRN
  Administered 2020-03-23 – 2020-03-24 (×2): 25 mg via ORAL
  Administered 2020-04-01: 50 mg via ORAL
  Filled 2020-03-22 (×4): qty 1

## 2020-03-22 MED ORDER — ENALAPRIL MALEATE 5 MG PO TABS
10.0000 mg | ORAL_TABLET | Freq: Every day | ORAL | Status: DC
  Administered 2020-03-23 – 2020-04-02 (×11): 10 mg via ORAL
  Filled 2020-03-22 (×11): qty 2

## 2020-03-22 MED ORDER — HYDROCODONE-ACETAMINOPHEN 5-325 MG PO TABS: 1.0000 | ORAL_TABLET | ORAL | 0 refills | Status: DC | PRN

## 2020-03-22 MED ORDER — DIPHENHYDRAMINE HCL 25 MG PO CAPS: 25.0000 mg | ORAL_CAPSULE | Freq: Four times a day (QID) | ORAL | 0 refills | Status: AC | PRN

## 2020-03-22 MED ORDER — ROSUVASTATIN CALCIUM 5 MG PO TABS
5.0000 mg | ORAL_TABLET | Freq: Every day | ORAL | Status: DC
  Administered 2020-03-23 – 2020-04-02 (×11): 5 mg via ORAL
  Filled 2020-03-22 (×11): qty 1

## 2020-03-22 MED ORDER — ACETAMINOPHEN 325 MG PO TABS
325.0000 mg | ORAL_TABLET | ORAL | Status: DC | PRN
  Administered 2020-03-23 – 2020-03-29 (×2): 650 mg via ORAL
  Filled 2020-03-22 (×4): qty 2

## 2020-03-22 MED ORDER — GUAIFENESIN-DM 100-10 MG/5ML PO SYRP
5.0000 mL | ORAL_SOLUTION | Freq: Four times a day (QID) | ORAL | Status: DC | PRN
  Administered 2020-03-25: 10 mL via ORAL
  Filled 2020-03-22: qty 10

## 2020-03-22 MED ORDER — DIPHENHYDRAMINE HCL 12.5 MG/5ML PO ELIX
12.5000 mg | ORAL_SOLUTION | Freq: Four times a day (QID) | ORAL | Status: DC | PRN
  Administered 2020-03-24: 25 mg via ORAL
  Filled 2020-03-22: qty 10

## 2020-03-22 MED ORDER — ANASTROZOLE 1 MG PO TABS
1.0000 mg | ORAL_TABLET | Freq: Every day | ORAL | Status: DC
  Administered 2020-03-23 – 2020-04-02 (×11): 1 mg via ORAL
  Filled 2020-03-22 (×11): qty 1

## 2020-03-22 MED ORDER — PROCHLORPERAZINE EDISYLATE 10 MG/2ML IJ SOLN: 5.0000 mg | Freq: Four times a day (QID) | INTRAMUSCULAR | Status: DC | PRN

## 2020-03-22 MED ORDER — METOPROLOL TARTRATE 5 MG/5ML IV SOLN: 5.0000 mg | Freq: Four times a day (QID) | INTRAVENOUS | Status: DC | PRN

## 2020-03-22 MED ORDER — PROCHLORPERAZINE 25 MG RE SUPP: 12.5000 mg | Freq: Four times a day (QID) | RECTAL | Status: DC | PRN

## 2020-03-22 MED ORDER — AMLODIPINE BESYLATE 10 MG PO TABS
10.0000 mg | ORAL_TABLET | Freq: Every day | ORAL | Status: DC
  Administered 2020-03-23 – 2020-04-02 (×11): 10 mg via ORAL
  Filled 2020-03-22 (×11): qty 1
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
## 2020-03-22 NOTE — TOC Transition Note (Signed)
Transition of Care (TOC) - CM/SW Discharge Note Marvetta Gibbons RN,BSN Transitions of Care Unit 4NP (non trauma) - RN Case Manager See Treatment Team for direct Phone #   Patient Details  Name: [name] MRN: [id number] Date of Birth: 1940-04-11  Transition of Care [facility]) CM/SW Contact:  Dawayne Patricia, RN Phone Number: 03/22/2020, 1:49 PM   Clinical Narrative:    Notified by [facility] pt has insurance auth and bed available for admit today. Pt stable for transition to [facility] INPT rehab- plan to transition later today to rehab.  Pt/spouse agreeable.    Final next level of care: IP Rehab Facility Barriers to Discharge: No Barriers Identified   Patient Goals and CMS Choice    INPT rehab    Discharge Placement               INPT rehab- [facility]        Discharge Plan and Services     Post Acute Care Choice: IP Rehab          DME Arranged: N/A DME Agency: NA       HH Arranged: NA          Social Determinants of Health (SDOH) Interventions     Readmission Risk Interventions Readmission Risk Prevention Plan 03/22/2020  Transportation Screening Complete  PCP or Specialist Appt within 5-7 Days Complete  Home Care Screening Complete  Medication Review (RN CM) Complete  Some recent data might be hidden

## 2020-03-22 NOTE — Progress Notes (Signed)
PT Cancellation Note  Patient Details Name: TEOLA FELIPE MRN: [id number] DOB: [birth year]   Cancelled Treatment:    Reason Eval/Treat Not Completed: Patient declined, no reason specified. PT attempted to see pt twice on this date. Pt initially declining due to headache and pain. PT attempted in afternoon, pt having just returned to supine after sitting up for >1 hour. PT will attempt to follow up as time allows.   Zenaida Niece 03/22/2020, 2:58 PM
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
## 2020-03-22 NOTE — Progress Notes (Signed)
Inpatient Rehabilitation Medication Review by a Pharmacist  A complete drug regimen review was completed for this patient to identify any potential clinically significant medication issues.  Clinically significant medication issues were identified:  Yes.   Type of Medication Issue Identified Description of Issue Urgent (address now) Non-Urgent (address on AM team rounds) Plan Plan Accepted by Provider? (Yes / No / Pending AM Rounds)  Drug Interaction(s) (clinically significant)       Duplicate Therapy       Allergy       No Medication Administration End Date       Incorrect Dose       Additional Drug Therapy Needed  Aanstrozole 1 mg daily on PTA list and not restarted. Non-urgent PA okayed order to be entered. Yes  Other  Lopressor IV q6hr prn as reordered.  Biotin 1 mg daily on admission list.  Urgent  PA contacted to have Lopressor IV discontinued. Yes.    Name of provider notified for urgent issues identified: Algis Liming, PA-C  Provider Method of Notification: chat  Time spent performing this drug regimen review (minutes):  20 min.   Blenda Nicely 03/22/2020 8:59 PM
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
## 2020-03-22 NOTE — Progress Notes (Signed)
Inpatient Rehab Admissions Coordinator:    I have insurance approval and a bed available for pt to admit to CIR today. Viona Gilmore, NP, in agreement.  Will let pt/family and TOC team know.   Shann Medal, PT, DPT Admissions Coordinator [phone] 03/22/20  11:36 AM

## 2020-03-22 NOTE — Care Management Important Message (Signed)
Important Message  Patient Details  Name: [name] MRN: [id number] Date of Birth: Oct 23, 1940   Medicare Important Message Given:  Yes - Important Message mailed due to current National Emergency   Verbal consent obtained due to current National Emergency  Relationship to patient: Self Contact Name: Allayah Raineri Call Date: 03/22/20  Time: 1355 Phone: [phone] Outcome: Spoke with contact Important Message mailed to: Patient address on file    Delorse Lek 03/22/2020, 1:55 PM
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
## 2020-03-23 ENCOUNTER — Encounter (HOSPITAL_COMMUNITY): Payer: PPO

## 2020-03-23 LAB — CBC WITH DIFFERENTIAL/PLATELET
Abs Immature Granulocytes: 0.11 10*3/uL — ABNORMAL HIGH (ref 0.00–0.07)
Basophils Absolute: 0.1 10*3/uL (ref 0.0–0.1)
Basophils Relative: 1 %
Eosinophils Absolute: 0.3 10*3/uL (ref 0.0–0.5)
Eosinophils Relative: 3 %
HCT: 28.6 % — ABNORMAL LOW (ref 36.0–46.0)
Hemoglobin: 9.9 g/dL — ABNORMAL LOW (ref 12.0–15.0)
Immature Granulocytes: 1 %
Lymphocytes Relative: 22 %
Lymphs Abs: 1.8 10*3/uL (ref 0.7–4.0)
MCH: 29.4 pg (ref 26.0–34.0)
MCHC: 34.6 g/dL (ref 30.0–36.0)
MCV: 84.9 fL (ref 80.0–100.0)
Monocytes Absolute: 0.6 10*3/uL (ref 0.1–1.0)
Monocytes Relative: 7 %
Neutro Abs: 5.3 10*3/uL (ref 1.7–7.7)
Neutrophils Relative %: 66 %
Platelets: 173 10*3/uL (ref 150–400)
RBC: 3.37 MIL/uL — ABNORMAL LOW (ref 3.87–5.11)
RDW: 13.6 % (ref 11.5–15.5)
WBC: 8.2 10*3/uL (ref 4.0–10.5)
nRBC: 0 % (ref 0.0–0.2)

## 2020-03-23 LAB — COMPREHENSIVE METABOLIC PANEL
ALT: 47 U/L — ABNORMAL HIGH (ref 0–44)
AST: 31 U/L (ref 15–41)
Albumin: 2.6 g/dL — ABNORMAL LOW (ref 3.5–5.0)
Alkaline Phosphatase: 65 U/L (ref 38–126)
Anion gap: 10 (ref 5–15)
BUN: 18 mg/dL (ref 8–23)
CO2: 25 mmol/L (ref 22–32)
Calcium: 8.9 mg/dL (ref 8.9–10.3)
Chloride: 101 mmol/L (ref 98–111)
Creatinine, Ser: 1.36 mg/dL — ABNORMAL HIGH (ref 0.44–1.00)
GFR, Estimated: 40 mL/min — ABNORMAL LOW (ref >60)
Glucose, Bld: 118 mg/dL — ABNORMAL HIGH (ref 70–99)
Potassium: 3.5 mmol/L (ref 3.5–5.1)
Sodium: 136 mmol/L (ref 135–145)
Total Bilirubin: 1 mg/dL (ref 0.3–1.2)
Total Protein: 5.5 g/dL — ABNORMAL LOW (ref 6.5–8.1)
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
## 2020-03-23 NOTE — Plan of Care (Signed)
  Problem: RH Balance Goal: LTG Patient will maintain dynamic sitting balance (PT) Description: LTG:  Patient will maintain dynamic sitting balance with assistance during mobility activities (PT) Flowsheets (Taken 03/23/2020 1533) LTG: Pt will maintain dynamic sitting balance during mobility activities with:: Independent Goal: LTG Patient will maintain dynamic standing balance (PT) Description: LTG:  Patient will maintain dynamic standing balance with assistance during mobility activities (PT) Flowsheets (Taken 03/23/2020 1533) LTG: Pt will maintain dynamic standing balance during mobility activities with:: Independent with assistive device    Problem: RH Bed Mobility Goal: LTG Patient will perform bed mobility with assist (PT) Description: LTG: Patient will perform bed mobility with assistance, with/without cues (PT). Flowsheets (Taken 03/23/2020 1533) LTG: Pt will perform bed mobility with assistance level of: Supervision/Verbal cueing   Problem: RH Bed to Chair Transfers Goal: LTG Patient will perform bed/chair transfers w/assist (PT) Description: LTG: Patient will perform bed to chair transfers with assistance (PT). Flowsheets (Taken 03/23/2020 1533) LTG: Pt will perform Bed to Chair Transfers with assistance level: Independent with assistive device    Problem: RH Car Transfers Goal: LTG Patient will perform car transfers with assist (PT) Description: LTG: Patient will perform car transfers with assistance (PT). Flowsheets (Taken 03/23/2020 1533) LTG: Pt will perform car transfers with assist:: Minimal Assistance - Patient > 75%   Problem: RH Ambulation Goal: LTG Patient will ambulate in controlled environment (PT) Description: LTG: Patient will ambulate in a controlled environment, # of feet with assistance (PT). Flowsheets (Taken 03/23/2020 1533) LTG: Pt will ambulate in controlled environ  assist needed:: Independent with assistive device LTG: Ambulation distance in controlled  environment: 13ft with LRAD Goal: LTG Patient will ambulate in home environment (PT) Description: LTG: Patient will ambulate in home environment, # of feet with assistance (PT). Flowsheets (Taken 03/23/2020 1533) LTG: Pt will ambulate in home environ  assist needed:: Independent with assistive device LTG: Ambulation distance in home environment: 43ft with LRAD   Problem: RH Wheelchair Mobility Goal: LTG Patient will propel w/c in controlled environment (PT) Description: LTG: Patient will propel wheelchair in controlled environment, # of feet with assist (PT) Flowsheets (Taken 03/23/2020 1533) LTG: Pt will propel w/c in controlled environ  assist needed:: Supervision/Verbal cueing LTG: Propel w/c distance in controlled environment: 151ft   Problem: RH Stairs Goal: LTG Patient will ambulate up and down stairs w/assist (PT) Description: LTG: Patient will ambulate up and down # of stairs with assistance (PT) Flowsheets (Taken 03/23/2020 1533) LTG: Pt will ambulate up/down stairs assist needed:: Minimal Assistance - Patient > 75% LTG: Pt will  ambulate up and down number of stairs: 3 steps with UE support

## 2020-03-23 NOTE — Plan of Care (Signed)
Problem: RH Balance Goal: LTG: Patient will maintain dynamic sitting balance (OT) Description: LTG:  Patient will maintain dynamic sitting balance with assistance during activities of daily living (OT) Flowsheets (Taken 03/23/2020 1248) LTG: Pt will maintain dynamic sitting balance during ADLs with: Independent with assistive device Goal: LTG Patient will maintain dynamic standing with ADLs (OT) Description: LTG:  Patient will maintain dynamic standing balance with assist during activities of daily living (OT)  Flowsheets (Taken 03/23/2020 1248) LTG: Pt will maintain dynamic standing balance during ADLs with: Supervision/Verbal cueing   Problem: Sit to Stand Goal: LTG:  Patient will perform sit to stand in prep for activites of daily living with assistance level (OT) Description: LTG:  Patient will perform sit to stand in prep for activites of daily living with assistance level (OT) Flowsheets (Taken 03/23/2020 1248) LTG: PT will perform sit to stand in prep for activites of daily living with assistance level: Supervision/Verbal cueing   Problem: RH Eating Goal: LTG Patient will perform eating w/assist, cues/equip (OT) Description: LTG: Patient will perform eating with assist, with/without cues using equipment (OT) Flowsheets (Taken 03/23/2020 1248) LTG: Pt will perform eating with assistance level of: Independent   Problem: RH Grooming Goal: LTG Patient will perform grooming w/assist,cues/equip (OT) Description: LTG: Patient will perform grooming with assist, with/without cues using equipment (OT) Flowsheets (Taken 03/23/2020 1248) LTG: Pt will perform grooming with assistance level of: Independent with assistive device    Problem: RH Bathing Goal: LTG Patient will bathe all body parts with assist levels (OT) Description: LTG: Patient will bathe all body parts with assist levels (OT) Flowsheets (Taken 03/23/2020 1248) LTG: Pt will perform bathing with assistance level/cueing:  Supervision/Verbal cueing   Problem: RH Dressing Goal: LTG Patient will perform upper body dressing (OT) Description: LTG Patient will perform upper body dressing with assist, with/without cues (OT). Flowsheets (Taken 03/23/2020 1248) LTG: Pt will perform upper body dressing with assistance level of: Independent with assistive device Goal: LTG Patient will perform lower body dressing w/assist (OT) Description: LTG: Patient will perform lower body dressing with assist, with/without cues in positioning using equipment (OT) Flowsheets (Taken 03/23/2020 1248) LTG: Pt will perform lower body dressing with assistance level of: Supervision/Verbal cueing   Problem: RH Toileting Goal: LTG Patient will perform toileting task (3/3 steps) with assistance level (OT) Description: LTG: Patient will perform toileting task (3/3 steps) with assistance level (OT)  Flowsheets (Taken 03/23/2020 1248) LTG: Pt will perform toileting task (3/3 steps) with assistance level: Supervision/Verbal cueing   Problem: RH Simple Meal Prep Goal: LTG Patient will perform simple meal prep w/assist (OT) Description: LTG: Patient will perform simple meal prep with assistance, with/without cues (OT). Flowsheets (Taken 03/23/2020 1248) LTG: Pt will perform simple meal prep with assistance level of: Supervision/Verbal cueing   Problem: RH Laundry Goal: LTG Patient will perform laundry w/assist, cues (OT) Description: LTG: Patient will perform laundry with assistance, with/without cues (OT). Flowsheets (Taken 03/23/2020 1248) LTG: Pt will perform laundry with assistance level of: Supervision/Verbal cueing   Problem: RH Light Housekeeping Goal: LTG Patient will perform light housekeeping w/assist (OT) Description: LTG: Patient will perform light housekeeping with assistance, with/without cues (OT). Flowsheets (Taken 03/23/2020 1248) LTG: Pt will perform light housekeeping with assistance level of: Supervision/Verbal cueing    Problem: RH Toilet Transfers Goal: LTG Patient will perform toilet transfers w/assist (OT) Description: LTG: Patient will perform toilet transfers with assist, with/without cues using equipment (OT) Flowsheets (Taken 03/23/2020 1248) LTG: Pt will perform toilet transfers with assistance  level of: Supervision/Verbal cueing   Problem: RH Tub/Shower Transfers Goal: LTG Patient will perform tub/shower transfers w/assist (OT) Description: LTG: Patient will perform tub/shower transfers with assist, with/without cues using equipment (OT) Flowsheets (Taken 03/23/2020 1248) LTG: Pt will perform tub/shower stall transfers with assistance level of: Supervision/Verbal cueing

## 2020-03-24 ENCOUNTER — Inpatient Hospital Stay (HOSPITAL_COMMUNITY): Payer: PPO

## 2020-03-24 DIAGNOSIS — S069X0D Unspecified intracranial injury without loss of consciousness, subsequent encounter: Secondary | ICD-10-CM

## 2020-03-24 DIAGNOSIS — M79605 Pain in left leg: Secondary | ICD-10-CM

## 2020-03-24 DIAGNOSIS — Z9889 Other specified postprocedural states: Secondary | ICD-10-CM

## 2020-03-24 DIAGNOSIS — M79604 Pain in right leg: Secondary | ICD-10-CM

## 2020-03-24 LAB — BASIC METABOLIC PANEL
Anion gap: 10 (ref 5–15)
BUN: 17 mg/dL (ref 8–23)
CO2: 25 mmol/L (ref 22–32)
Calcium: 8.8 mg/dL — ABNORMAL LOW (ref 8.9–10.3)
Chloride: 103 mmol/L (ref 98–111)
Creatinine, Ser: 1.38 mg/dL — ABNORMAL HIGH (ref 0.44–1.00)
GFR, Estimated: 39 mL/min — ABNORMAL LOW (ref >60)
Glucose, Bld: 115 mg/dL — ABNORMAL HIGH (ref 70–99)
Potassium: 3.7 mmol/L (ref 3.5–5.1)
Sodium: 138 mmol/L (ref 135–145)

## 2020-03-24 LAB — GLUCOSE, CAPILLARY
Glucose-Capillary: 85 mg/dL (ref 70–99)
Glucose-Capillary: 135 mg/dL — ABNORMAL HIGH (ref 70–99)

## 2020-03-24 MED ORDER — INSULIN ASPART 100 UNIT/ML ~~LOC~~ SOLN
0.0000 [IU] | Freq: Three times a day (TID) | SUBCUTANEOUS | Status: DC
  Administered 2020-03-25: 2 [IU] via SUBCUTANEOUS
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
## 2020-03-24 NOTE — Progress Notes (Signed)
Lower extremity venous bilateral study completed.   Please see CV Proc for preliminary results.   Alberta Cairns, RDMS, RVT  

## 2020-03-25 DIAGNOSIS — S069X1S Unspecified intracranial injury with loss of consciousness of 30 minutes or less, sequela: Secondary | ICD-10-CM

## 2020-03-25 DIAGNOSIS — D62 Acute posthemorrhagic anemia: Secondary | ICD-10-CM

## 2020-03-25 DIAGNOSIS — R7989 Other specified abnormal findings of blood chemistry: Secondary | ICD-10-CM

## 2020-03-25 DIAGNOSIS — S32411S Displaced fracture of anterior wall of right acetabulum, sequela: Secondary | ICD-10-CM

## 2020-03-25 LAB — CBC
HCT: 28.8 % — ABNORMAL LOW (ref 36.0–46.0)
Hemoglobin: 9.6 g/dL — ABNORMAL LOW (ref 12.0–15.0)
MCH: 28.7 pg (ref 26.0–34.0)
MCHC: 33.3 g/dL (ref 30.0–36.0)
MCV: 86.2 fL (ref 80.0–100.0)
Platelets: 219 10*3/uL (ref 150–400)
RBC: 3.34 MIL/uL — ABNORMAL LOW (ref 3.87–5.11)
RDW: 13.7 % (ref 11.5–15.5)
WBC: 6.7 10*3/uL (ref 4.0–10.5)
nRBC: 0 % (ref 0.0–0.2)

## 2020-03-25 LAB — BASIC METABOLIC PANEL
Anion gap: 10 (ref 5–15)
BUN: 19 mg/dL (ref 8–23)
CO2: 25 mmol/L (ref 22–32)
Calcium: 8.9 mg/dL (ref 8.9–10.3)
Chloride: 105 mmol/L (ref 98–111)
Creatinine, Ser: 1.45 mg/dL — ABNORMAL HIGH (ref 0.44–1.00)
GFR, Estimated: 37 mL/min — ABNORMAL LOW (ref >60)
Glucose, Bld: 112 mg/dL — ABNORMAL HIGH (ref 70–99)
Potassium: 3.7 mmol/L (ref 3.5–5.1)
Sodium: 140 mmol/L (ref 135–145)

## 2020-03-25 LAB — HEMOGLOBIN A1C
Hgb A1c MFr Bld: 7.1 % — ABNORMAL HIGH (ref 4.8–5.6)
Mean Plasma Glucose: 157.07 mg/dL

## 2020-03-25 LAB — GLUCOSE, CAPILLARY
Glucose-Capillary: 98 mg/dL (ref 70–99)
Glucose-Capillary: 96 mg/dL (ref 70–99)
Glucose-Capillary: 164 mg/dL — ABNORMAL HIGH (ref 70–99)
Glucose-Capillary: 122 mg/dL — ABNORMAL HIGH (ref 70–99)
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
## 2020-03-25 NOTE — Progress Notes (Signed)
Inpatient Rehabilitation  Patient information reviewed and entered into eRehab system by Melissa M. Bowie, M.A., CCC/SLP, PPS Coordinator.  Information including medical coding, functional ability and quality indicators will be reviewed and updated through discharge.    

## 2020-03-25 NOTE — Progress Notes (Signed)
Physical Therapy Session Note  Patient Details  Name: [name] MRN: [id number] Date of Birth: [birth year]  Today's Date: 03/25/2020 PT Individual Time: 0805-0900 PT Individual Time Calculation (min): 55 min   Short Term Goals: Week 1:  PT Short Term Goal 1 (Week 1): Pt will perform bed mobility with CGA PT Short Term Goal 2 (Week 1): Pt will ambulate 129ft with min assist PT Short Term Goal 3 (Week 1): Pt wil propell WC 168ft with supervision asssit PT Short Term Goal 4 (Week 1): Pt will ascend 4 steps with mod assist and LRAD  Skilled Therapeutic Interventions/Progress Updates:     Pt received supine in bed and agrees to therapy. Reports pain in R hip and leg. Number not provided. PT provides rest breaks as needed to manage pain. Supine to sit with verbal cues on positioning and sequencing and minA. Stand pivot transfer to [facility] with minA. WC transport to gym for time management and energy conservation. PT provides pt with L platform walker due to pt's laceration between thumb and forefinger on L hand. Pt performs multiple bouts of sit to stand during session with minA at hips to facilitate anterior weight shift, and verbal cues for hand placement and sequencing. Pt ambulates bouts of 2x100' with minA and extended seated rest break. Pt transfers back to bed without AD and with minA. Sit to supine with CGA. Left supine in bed with alarm intact and all needs within reach.  Therapy Documentation Precautions:  Precautions Precautions: Fall Restrictions Weight Bearing Restrictions: No RLE Weight Bearing: Weight bearing as tolerated LLE Weight Bearing: Weight bearing as tolerated   Therapy/Group: Individual Therapy  Breck Coons, PT, DPT 03/25/2020, 3:55 PM
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
## 2020-03-25 NOTE — Progress Notes (Signed)
Occupational Therapy Session Note  Patient Details  Name: SYMPHONY DEMURO MRN: [id number] Date of Birth: August 10, 1940  Today's Date: 03/25/2020 OT Individual Time: 1105-1200 OT Individual Time Calculation (min): 55 min    Short Term Goals: Week 1:  OT Short Term Goal 1 (Week 1): Pt will complete toilet transfer with close S + AE PRN. OT Short Term Goal 2 (Week 1): Pt will complete shower transfer with CGA + AE PRN. OT Short Term Goal 3 (Week 1): Pt will don pants min A + AE PRN.  Skilled Therapeutic Interventions/Progress Updates:    Pt greeted semi-reclined in bed and agreeable to OT treatment session. Pt agreeable to shower today. Pt completed bed mobility with supervision and HOB elevated. Sit<>stand from EOB with platform RW and min A. Pt then ambulated into bathroom with CGA. Pt sat on BSC in shower for bathing, overall min A to reach lower legs. OT educated on modified dressing strategies with pt able to complete all dressing tasks with Min A. Sit<>stands at the sink with Min/CGA. Worked on standing balance/endurance with standing grooming tasks. Pt left seated in wc at end of session with chair alarm on, call bell in reach, and needs met.   Therapy Documentation Precautions:  Precautions Precautions: Fall Restrictions Weight Bearing Restrictions: No RLE Weight Bearing: Weight bearing as tolerated LLE Weight Bearing: Weight bearing as tolerated Pain: Pain Assessment Pain Score: 0-No pain   Therapy/Group: Individual Therapy  Valma Cava 03/25/2020, 11:54 AM

## 2020-03-25 NOTE — IPOC Note (Signed)
Overall Plan of Care [facility]) Patient Details Name: ERIS HANNAN MRN: [id number] DOB: [birth year]  Admitting Diagnosis: TBI (traumatic brain injury) [facility])  Hospital Problems: Principal Problem:   TBI (traumatic brain injury) ([place])     Functional Problem List: Nursing Bladder,Bowel,Edema,Endurance,Nutrition,Motor,Pain,Safety,Skin Integrity  PT Balance,Edema,Endurance,Motor,Pain,Skin Integrity  OT Balance,Skin Integrity,Endurance,Motor,Pain  SLP    TR         Basic ADL's: OT Eating,Grooming,Bathing,Dressing,Toileting     Advanced  ADL's: OT Simple Meal Preparation     Transfers: PT Bed Mobility,Bed to Chair,Car,Furniture  OT Toilet,Tub/Shower     Locomotion: PT Ambulation,Wheelchair Mobility,Stairs     Additional Impairments: OT None  SLP        TR      Anticipated Outcomes Item Anticipated Outcome  Self Feeding ind  Swallowing      Basic self-care  S  Toileting  S   Bathroom Transfers S  Bowel/Bladder  patient will regain continence of bowel and bladder and maintain regular voiding pattern  Transfers  Supervision assist with LRAD  Locomotion  Mod I with LRAD, ambulatory with LRAD  Communication     Cognition     Pain  less than 4  Safety/Judgment  No falls, skin breakdown, infection while on rehab   Therapy Plan: PT Intensity: Minimum of 1-2 x/day ,45 to 90 minutes PT Frequency: 5 out of 7 days PT Duration Estimated Length of Stay: 12-16 days OT Intensity: Minimum of 1-2 x/day, 45 to 90 minutes OT Frequency: 5 out of 7 days OT Duration/Estimated Length of Stay: 12 to 14 days     Due to the current state of emergency, patients may not be receiving their 3-hours of Medicare-mandated therapy.   Team Interventions: Nursing Interventions Patient/Family Education,Bladder [place] Management,Medication Management,Skin Care/Wound Management,Discharge Planning,Psychosocial Support  PT interventions Ambulation/gait  training,Balance/vestibular training,Cognitive remediation/compensation,Community reintegration,Discharge planning,Disease management/prevention,DME/adaptive equipment instruction,Functional mobility training,Neuromuscular re-education,Pain management,Patient/family education,Psychosocial support,Skin care/wound management,Splinting/orthotics,Stair training,Therapeutic Activities,Therapeutic Exercise,UE/LE Strength taining/ROM,UE/LE Coordination activities,Visual/perceptual remediation/compensation,Wheelchair propulsion/positioning  OT Interventions Balance/vestibular training,Disease Special educational needs teacher re-education,Self Care/advanced ADL retraining,Therapeutic Exercise,Wheelchair propulsion/positioning,UE/LE Strength taining/ROM,Skin care/wound Cytogeneticist instruction,Community reintegration,Patient/family education,Splinting/orthotics,UE/LE Coordination activities,Therapeutic Activities,Functional mobility training,Discharge planning,Psychosocial support  SLP Interventions    TR Interventions    SW/CM Interventions Discharge Planning,Psychosocial Support,Patient/Family Education   Barriers to Discharge MD  Medical stability  Nursing Decreased caregiver support    PT Inaccessible home environment,Decreased caregiver support,Insurance for SNF coverage    OT      SLP      SW       Team Discharge Planning: Destination: PT-Home ,OT- Home , SLP-Home Projected Follow-up: PT-Home health PT, OT-  Home health OT, SLP-None Projected Equipment Needs: PT-Wheelchair (measurements),Rolling walker with 5" wheels,Wheelchair cushion (measurements), OT- Tub/shower seat, SLP-None recommended by SLP Equipment Details: PT- , OT-  Patient/family involved in discharge planning: PT- Patient,  OT-Patient, SLP-Patient  MD ELOS: 12-14 days Medical Rehab Prognosis:  Excellent Assessment: The patient has been admitted for CIR therapies with the diagnosis of TBI.  The team will be addressing functional mobility, strength, stamina, balance, safety, adaptive techniques and equipment, self-care, bowel and bladder mgt, patient and caregiver education, NMR, cognitive perceptual rx, community reentry. Goals have been set at supervision for self-care and supervision to mod I with mobility. .   Due to the current state of emergency, patients may not be receiving their 3 hours per day of Medicare-mandated therapy.    Meredith Staggers, MD, [facility]      See Team Conference Notes for weekly updates to the plan  of care

## 2020-03-25 NOTE — Care Management (Signed)
[facility] Individual Statement of Services  Patient Name:  [name]  Date:  03/25/2020  Welcome to the [facility].  Our goal is to provide you with an individualized program based on your diagnosis and situation, designed to meet your specific needs.  With this comprehensive rehabilitation program, you will be expected to participate in at least 3 hours of rehabilitation therapies Monday-Friday, with modified therapy programming on the weekends.  Your rehabilitation program will include the following services:  Physical Therapy (PT), Occupational Therapy (OT), Speech Therapy (ST), 24 hour per day rehabilitation nursing, Therapeutic Recreaction (TR), Psychology, Neuropsychology, Care Coordinator, Rehabilitation Medicine, Nutrition Services, Pharmacy Services and Other  Weekly team conferences will be held on Tuesdays to discuss your progress.  Your Inpatient Rehabilitation Care Coordinator will talk with you frequently to get your input and to update you on team discussions.  Team conferences with you and your family in attendance may also be held.  Expected length of stay: 12-16 days  Overall anticipated outcome: Supervision  Depending on your progress and recovery, your program may change. Your Inpatient Rehabilitation Care Coordinator will coordinate services and will keep you informed of any changes. Your Inpatient Rehabilitation Care Coordinator's name and contact numbers are listed  below.  The following services may also be recommended but are not provided by the [facility] will be made to provide these services after discharge if needed.  Arrangements include referral to agencies that provide these services.  Your insurance has been verified to be:  [employer]  Your primary doctor is:  [name]  Pertinent information will be shared with your doctor and your insurance company.  Inpatient Rehabilitation Care Coordinator:  Cathleen Corti [phone] or ([phone]  Information discussed with and copy given to patient by: Rana Snare, 03/25/2020, 9:39 AM

## 2020-03-26 LAB — GLUCOSE, CAPILLARY
Glucose-Capillary: 85 mg/dL (ref 70–99)
Glucose-Capillary: 87 mg/dL (ref 70–99)
Glucose-Capillary: 109 mg/dL — ABNORMAL HIGH (ref 70–99)
Glucose-Capillary: 129 mg/dL — ABNORMAL HIGH (ref 70–99)

## 2020-03-26 LAB — OCCULT BLOOD X 1 CARD TO LAB, STOOL: Fecal Occult Bld: NEGATIVE
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
## 2020-03-26 NOTE — Progress Notes (Signed)
Occupational Therapy Session Note  Patient Details  Name: [name] MRN: [id number] Date of Birth: 1940/05/17  Today's Date: 03/26/2020 Session 1 OT Individual Time: 1000-1030 OT Individual Time Calculation (min): 30 min   Session 2 OT Individual Time: 3005-1102 OT Individual Time Calculation (min): 56 min   Short Term Goals: Week 1:  OT Short Term Goal 1 (Week 1): Pt will complete toilet transfer with close S + AE PRN. OT Short Term Goal 2 (Week 1): Pt will complete shower transfer with CGA + AE PRN. OT Short Term Goal 3 (Week 1): Pt will don pants min A + AE PRN.  Skilled Therapeutic Interventions/Progress Updates:  Session 1   Pt greeted in bathroom after continent BM and bladder. Pt ambulated out of bathroom with platrform RW and CGA. Pt stood at the sink to wash her hands with supervision. Pt then sat down in wc and brought down to therapy gym. Worked on standing balance/endurance as well as L fine motor control and thumb opposition with graded peg board task. Pt then completed 10 sit<>stands with focus on powering up. Pt returned to room and completed stand-pivot back to bed with CGA. Pt left semi-reclined in bed with needs met.   Session 2 Pt greeted semi-reclined in bed with spouse present and agreeable to OT treatment session. Pt reported need to use the bathroom. Pt completed bed mobility with HOB elevated and supervision. Sit<>stand with platform RW and CGA. Pt ambulated into bathroom w/ CGA. Pt able to manage clothing and completed continent void with supervision for peri-care. Pt then ambulated to the sink to wash hands. Pt took seated rest break, then ambulated in hallway to nurses station with [place]. Pt brought to therapy gym and worked on standing balance/endurance standing on foam block. Pt tolerated standing for 10 minutes while participating in vertical checkers activity. OT issued soft tan theraputty and worked on pincer grasp, but pt reported some increased pain in L  thumb web, so OT graded task to fine motor activity of locating small beads and pulling them out of putty. Pt ambulated from therapy gym back to her room at end of session with 1 standing rest break. Pt returned to bed and left semi-reclined in bed with bed alarm on, spouse present, and needs met.   Therapy Documentation Precautions:  Precautions Precautions: Fall Restrictions Weight Bearing Restrictions: Yes RLE Weight Bearing: Weight bearing as tolerated LLE Weight Bearing: Weight bearing as tolerated Pain: Pain Assessment Pain Scale: 0-10 Patient reports pain is controlled  Therapy/Group: Individual Therapy  Valma Cava 03/26/2020, 2:03 PM

## 2020-03-26 NOTE — Progress Notes (Signed)
Physical Therapy Session Note  Patient Details  Name: [name] MRN: [id number] Date of Birth: 1940-02-04  Today's Date: 03/26/2020 PT Individual Time: 1030-1115 PT Individual Time Calculation (min): 45 min   Short Term Goals: Week 1:  PT Short Term Goal 1 (Week 1): Pt will perform bed mobility with CGA PT Short Term Goal 2 (Week 1): Pt will ambulate 132ft with min assist PT Short Term Goal 3 (Week 1): Pt wil propell WC 189ft with supervision asssit PT Short Term Goal 4 (Week 1): Pt will ascend 4 steps with mod assist and LRAD  Skilled Therapeutic Interventions/Progress Updates:    Patient supine in bed upon PT arrival. Patient alert and agreeable to PT session. Patient relates regularly occurring pain levels and locations at start of session.  Therapeutic Exercise: Patient performed the following exercises with verbal and tactile cues for proper technique. Supine SLR AROM for LLE and AAROM for RLE x10 with cues for eccentric controlled return to bed surface. Supine BLE AROM hip abd/ adduction x10 Supine BLE AROM heel slides into flexion and resisted leg press into extension  Therapeutic Activity: Bed Mobility: Patient performed supine --> sit with supervision. Provided verbal cues for technique. At end of session, Pt requires Min A for BLE to bed surface with RLE requiring more assist than LLE. Pt is able to assist with positioning toward [facility] with BLE push and BUE pull at bedrails with Min A from therapist.  Transfers: Patient performed STS transfer to PFW with CGA from bed. Provided verbal cues for forward lean and push with BUE from bed surface. STS from w/c with CGA and increased ease with push from w/c armrests.   Gait Training:  Patient ambulated 79' x1using PFW with CGA. For 31' within this initial bout, pt guided in performance of high knee stepping with RLE in order to mobilize R hip and knee in full active ROM during functional movement.  Return amb bout with pt's norm  quality of gait with demo of slight improvement in RLE step height. No increased c/o pain.   Pt provided with education re: cognitive pain mgmt, identification of healing pain vs harmful pain. Add'l education re: nature of pelvis fractures and pain patterns, length of time for healing, as pt has many questions re: her injuries and bruises.   Patient supine in bed at end of session with brakes locked, bed alarm set, and all needs within reach. Rn made aware of pt's stated pain level and request for medication mgmt for pain.    Therapy Documentation Precautions:  Precautions Precautions: Fall Restrictions Weight Bearing Restrictions: Yes RLE Weight Bearing: Weight bearing as tolerated LLE Weight Bearing: Weight bearing as tolerated Pain: Pain Assessment Pain Scale: 0-10 Pain Score: 9     Therapy/Group: Individual Therapy  Alger Simons 03/26/2020, 1:01 PM
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
## 2020-03-26 NOTE — Progress Notes (Signed)
Physical Therapy Session Note  Patient Details  Name: [name] MRN: [id number] Date of Birth: Jun 16, 1940  Today's Date: 03/26/2020 PT Individual Time: 0805-0900 PT Individual Time Calculation (min): 55 min   Short Term Goals: Week 1:  PT Short Term Goal 1 (Week 1): Pt will perform bed mobility with CGA PT Short Term Goal 2 (Week 1): Pt will ambulate 170ft with min assist PT Short Term Goal 3 (Week 1): Pt wil propell WC 152ft with supervision asssit PT Short Term Goal 4 (Week 1): Pt will ascend 4 steps with mod assist and LRAD  Skilled Therapeutic Interventions/Progress Updates:     Pt received supine in bed and agrees to therapy. Complains of pain primarily in R hip/pelvis. PT provides mobility, repositioning, and rest breaks to manage pain. Pt performs supine to sit with bed features and cues on positioning at EOB. Sit to stand and stand pivot transfer to [facility] with CGA and no AD. WC transport to gym for time management. Pt performs sit to stand multiple times during session with CGA and cues for body mechanics. Pt ambulates x200' with platform RW and minA with cues for upright gaze to improve posture and balance, and decreasing WB through RW for energy conservation and increased strengthening for legs. Pt ambulates very slowly but not LOBs. Pt completes x8 3" steps with BHRs and CGA, with PT cueing on step sequencing. Following, pt completes 2x4 6" steps with seated rest break, and CGA.   Pt performs Nustep for 8:00 at workload of 3 with steps per minute >50. Activity performed for strength and endurance training as well as to increase ROM in painful R lower extremity. Pt verbalizes improved feeling in leg following activity.  WC transport back to room. Stand step transfer back to bed with HHA and minA, with cues for upright posture and sequencing. Sit to supine with supervision. Left with alarm intact and all needs within reach.  Therapy Documentation Precautions:   Precautions Precautions: Fall Restrictions Weight Bearing Restrictions: Yes RLE Weight Bearing: Weight bearing as tolerated LLE Weight Bearing: Weight bearing as tolerated   Therapy/Group: Individual Therapy  Breck Coons, PT, DPT 03/26/2020, 4:03 PM

## 2020-03-26 NOTE — Progress Notes (Signed)
Patient ID: [name], female   DOB: 11-15-1940, 80 y.o.   MRN: [id number]  SW met with pt and pt husband in room to provide updates from team conference, and d/c date 3/8. Family edu scheduled on Thursday, 3/3 1pm-3pm with pt husband.   Loralee Pacas, MSW, [place] Office: [phone] Cell: [phone] Fax: [phone]

## 2020-03-27 LAB — BASIC METABOLIC PANEL
Anion gap: 10 (ref 5–15)
BUN: 17 mg/dL (ref 8–23)
CO2: 22 mmol/L (ref 22–32)
Calcium: 9.2 mg/dL (ref 8.9–10.3)
Chloride: 107 mmol/L (ref 98–111)
Creatinine, Ser: 1.38 mg/dL — ABNORMAL HIGH (ref 0.44–1.00)
GFR, Estimated: 39 mL/min — ABNORMAL LOW (ref >60)
Glucose, Bld: 92 mg/dL (ref 70–99)
Potassium: 4.1 mmol/L (ref 3.5–5.1)
Sodium: 139 mmol/L (ref 135–145)

## 2020-03-27 LAB — CBC
HCT: 31 % — ABNORMAL LOW (ref 36.0–46.0)
Hemoglobin: 10.3 g/dL — ABNORMAL LOW (ref 12.0–15.0)
MCH: 29.2 pg (ref 26.0–34.0)
MCHC: 33.2 g/dL (ref 30.0–36.0)
MCV: 87.8 fL (ref 80.0–100.0)
Platelets: 270 10*3/uL (ref 150–400)
RBC: 3.53 MIL/uL — ABNORMAL LOW (ref 3.87–5.11)
RDW: 14.1 % (ref 11.5–15.5)
WBC: 7.1 10*3/uL (ref 4.0–10.5)
nRBC: 0 % (ref 0.0–0.2)

## 2020-03-27 LAB — GLUCOSE, CAPILLARY
Glucose-Capillary: 81 mg/dL (ref 70–99)
Glucose-Capillary: 99 mg/dL (ref 70–99)
Glucose-Capillary: 105 mg/dL — ABNORMAL HIGH (ref 70–99)
Glucose-Capillary: 136 mg/dL — ABNORMAL HIGH (ref 70–99)

## 2020-03-27 LAB — OCCULT BLOOD X 1 CARD TO LAB, STOOL: Fecal Occult Bld: NEGATIVE

## 2020-03-27 NOTE — Progress Notes (Signed)
[name], female   DOB: 05/11/1940, 80 y.o.   MRN: [id number]  SW met with pt and pt husband in room to discuss d/c recommendations: Outpatient PT/OT and Left platform attachment. Preferred outpatient location is [facility] (p:[phone]/f:[phone]).   SW faxed outpatient PT/OT referral. SW ordered platform attachment with [place] via parachute.   Loralee Pacas, MSW, [place] Office: [phone] Cell: [phone] Fax: [phone]

## 2020-03-27 NOTE — Progress Notes (Signed)
Physical Therapy Session Note  Patient Details  Name: [name] MRN: [id number] Date of Birth: 1940-09-27  Today's Date: 03/27/2020 PT Individual Time: 0810-0905 and 1350-1430 PT Individual Time Calculation (min): 55 min   Short Term Goals: Week 1:  PT Short Term Goal 1 (Week 1): Pt will perform bed mobility with CGA PT Short Term Goal 2 (Week 1): Pt will ambulate 137ft with min assist PT Short Term Goal 3 (Week 1): Pt wil propell WC 141ft with supervision asssit PT Short Term Goal 4 (Week 1): Pt will ascend 4 steps with mod assist and LRAD  Skilled Therapeutic Interventions/Progress Updates:     1st Session: Pt received supine in bed and agrees to therapy. Pt performs supine therex prior to mobility for strengthening, including 1x10 quad sets, glute sets, AAROM SLRs, heel slides, and hip abduction. Supine to sit with bed features and cues on sequencing. Pt performs stand step transfer to [facility] with CGA and no AD. WC transport to gym for time management. Pt performs NMR in parallel bars with mirror for visual feedback, focusing on improving balance with focus on hip abductor strength. Pt initially performs sidestepping in bilateral directions x30', with PT providing CGA and cues for upright posture, decreasing WB through upper extremities, and maintaining hip and trunk extension. Following seated rest break, pt performs forward/backward ambulation x20' with bilateral upper extremity support on parallel bars, with cues to increase stride length and upright gaze. Pt then performs same task without upper extremity support, requiring minA from PT for balance and safety. WC transport back to room. Stand step transfer back to bed with CGA. Left supine with alarm intact and all needs within reach.  2nd Session: Pt received supine in bed and agrees to therapy. No complaint of pain. Supine to sit with supervision and cues for positioning. Stand step transfer to [facility] with CGA and cues for body mechanics and  positioning. WC transport to gym for time management. Pt ambulates bouts of x200' and x140' with platform RW and CGA, with cues for increased stride length, upright gaze, and decreased WB through RW for energy conservation. Pt performs NMR for standing balance and activity tolerance, tossing ball at trampoline and catching rebound. Pt balances without upper extremity support and requires CGA from PT for safety. Pt completes 1x20 chest passes and 1x20 overhead passes with seated rest break. Pt completes x4 steps with bilateral hand rails and minA. Left seated in WC with all needs within reach.  Therapy Documentation Precautions:  Precautions Precautions: Fall Restrictions Weight Bearing Restrictions: Yes RLE Weight Bearing: Weight bearing as tolerated LLE Weight Bearing: Weight bearing as tolerated   Therapy/Group: Individual Therapy  Breck Coons, PT, DPT 03/27/2020, 4:20 PM

## 2020-03-27 NOTE — Progress Notes (Signed)
Inpatient Rehabilitation Care Coordinator Discharge Note  The overall goal for the admission was met for:   Discharge location: Yes. D/c to home with 24/7 care from husband.   Length of Stay: Yes. 10 days.   Discharge activity level: Yes. Supervision.  Home/community participation: Yes. Limited.   Services provided included: MD, RD, PT, OT, SLP, RN, CM, TR, Pharmacy, Neuropsych and SW  Financial Services: Private Insurance: [employer]  Choices offered to/list presented to:Yes  Follow-up services arranged: Outpatient: [facility] for outpatient PT/OT and Other: [place] for left platform attachment  Comments (or additional information):  Patient/Family verbalized understanding of follow-up arrangements: Yes  Individual responsible for coordination of the follow-up plan: contact pt [phone]  Confirmed correct DME delivered: Rana Snare 03/27/2020    Rana Snare

## 2020-03-27 NOTE — Progress Notes (Signed)
PROGRESS NOTE   Subjective/Complaints:  No new issues.pain controlled reasonable at present. Unfortunately broke glasses yesterday. Husband getting fixed but having a hard time seeing much today!  ROS: Patient denies fever, rash, sore throat, blurred vision, nausea, vomiting, diarrhea, cough, shortness of breath or chest pain, joint or back pain, headache, or mood change.   .    Objective:   No results found. Recent Labs    03/25/20 0448 03/27/20 0515  WBC 6.7 7.1  HGB 9.6* 10.3*  HCT 28.8* 31.0*  PLT 219 270   Recent Labs    03/25/20 0448 03/27/20 0515  NA 140 139  K 3.7 4.1  CL 105 107  CO2 25 22  GLUCOSE 112* 92  BUN 19 17  CREATININE 1.45* 1.38*  CALCIUM 8.9 9.2    Intake/Output Summary (Last 24 hours) at 03/27/2020 0802 Last data filed at 03/27/2020 0707 Gross per 24 hour  Intake 720 ml  Output -  Net 720 ml        Physical Exam: Vital Signs Blood pressure 131/82, pulse 100, temperature 98 F (36.7 C), resp. rate 18, height 5' 2.5" (1.588 m), weight 80.5 kg, SpO2 99 %.   Constitutional: No distress . Vital signs reviewed. HEENT: EOMI, oral membranes moist Neck: supple Cardiovascular: RRR without murmur. No JVD    Respiratory/Chest: CTA Bilaterally without wheezes or rales. Normal effort    GI/Abdomen: BS +, non-tender, non-distended Ext: no clubbing, cyanosis, or edema Psych: pleasant and cooperative Musculoskeletal:        LB tender with ROM.Marland Kitchen     Comments: Left hand with edema and tenderness improving. Able to grasp Skin:    Comments: Laceration left 1/2 web---sutures, CDI   Neurological:     Comments: Alert, oriented to place,date, reason. Improving insight and awareness.  RUE: 4/5 proximal distal LUE: 3-4/5.  Bilateral lower extremities: 3-to 4/5 proximal distal (pain inhibition ongoing on right side)     Assessment/Plan: 1. Functional deficits which require 3+ hours per day of  interdisciplinary therapy in a comprehensive inpatient rehab setting.  Physiatrist is providing close team supervision and 24 hour management of active medical problems listed below.  Physiatrist and rehab team continue to assess barriers to discharge/monitor patient progress toward functional and medical goals  Care Tool:  Bathing    Body parts bathed by patient: Right arm,Face,Left arm,Chest,Abdomen,Front perineal area,Buttocks,Right upper leg,Left upper leg,Right lower leg,Left lower leg         Bathing assist Assist Level: Minimal Assistance - Patient > 75%     Upper Body Dressing/Undressing Upper body dressing   What is the patient wearing?: Pull over shirt    Upper body assist Assist Level: Supervision/Verbal cueing    Lower Body Dressing/Undressing Lower body dressing      What is the patient wearing?: Underwear/pull up,Pants     Lower body assist Assist for lower body dressing: Minimal Assistance - Patient > 75%     Toileting Toileting    Toileting assist Assist for toileting: Minimal Assistance - Patient > 75%     Transfers Chair/bed transfer  Transfers assist     Chair/bed transfer assist level: Minimal Assistance - Patient >  75%     Locomotion Ambulation   Ambulation assist      Assist level: Minimal Assistance - Patient > 75% Assistive device: Walker-platform Max distance: 200'   Walk 10 feet activity   Assist     Assist level: Minimal Assistance - Patient > 75% Assistive device: Walker-platform   Walk 50 feet activity   Assist Walk 50 feet with 2 turns activity did not occur: Safety/medical concerns  Assist level: Minimal Assistance - Patient > 75% Assistive device: Walker-platform    Walk 150 feet activity   Assist Walk 150 feet activity did not occur: Safety/medical concerns  Assist level: Minimal Assistance - Patient > 75% Assistive device: Walker-platform    Walk 10 feet on uneven surface  activity   Assist  Walk 10 feet on uneven surfaces activity did not occur: Safety/medical concerns         Wheelchair     Assist   Type of Wheelchair: Manual    Wheelchair assist level: Minimal Assistance - Patient > 75% Max wheelchair distance: 150    Wheelchair 50 feet with 2 turns activity    Assist        Assist Level: Minimal Assistance - Patient > 75%   Wheelchair 150 feet activity     Assist      Assist Level: Minimal Assistance - Patient > 75%   Blood pressure 131/82, pulse 100, temperature 98 F (36.7 C), resp. rate 18, height 5' 2.5" (1.588 m), weight 80.5 kg, SpO2 99 %.  Medical Problem List and Plan: 1.  Pain in RLE and left hand, balance deficits, decreased endurance secondary to polytrauma with TBI.             -patient may shower             -ELOS/Goals: 04/02/20/supervision/min A.             -Continue CIR therapies including PT, OT, and SLP  2.  Antithrombotics: -DVT/anticoagulation:  Mechanical: Sequential compression devices, below knee Bilateral lower extremities  Dopplers clear- con't SCDs for now as she's ambulating             -antiplatelet therapy: N/A 3. Chronic LBP/Pain Management: Used to be managed by ESI every 3 months. Followed by Dr. Nelva Bush.             Continue hydrocodone prn              3/1 pain controlled 4. Mood: LCSW to follow for evaluation and support.              -antipsychotic agents: N/A 5. Neuropsych: This patient is partially capable of making decisions on her own behalf. 6. Skin/Wound Care: Routine pressure relief measure.  7. Fluids/Electrolytes/Nutrition: Monitor I/Os             Continue supplements to help with protein stores.        8. T2DM with hyperglycemia: Monitor BS ac/hs and use SSI for elevated BS.   03/27/20 cbg's reasnonably controlled   hgb A1C is 7.1             Continue amaryl 1 mg bid.               9. Right acetabular wall Fx/Pelvic Fx: Weightbearing as tolerated 10. Left hand laceration: Continue to  monitor for healing---remove sutures soon 11. Hypokalemia: Was on Kdur tid at home-->has been resumed with improvement.   2/28- K+ holding at 3.7  - con't daily Kdur 12.  Leucocytosis:    2/28 resolved               13. ABLA: Hgb up to 10.3 3/2!  -stool OB -, no signs of gross blood loss 14. HTN: Monitor BP tid--continue Enalapril, HCTZ (held) and meotprolol.              3/1 bp control better today. Continue above regimen 15. H/o Depression: Managed on Lexapro.  16. Hypoxia: Encourage IS.--Has chronic elevated right hemidiaphragm with low lung volumes noted at admission.                2/28- off O2 currently- con't to monitor and use O2 prn.  17. Prerenal azotemia:  -BUN 19/1.45 --> 17/1.38==continue to hold HCTZ   LOS: 5 days A FACE TO FACE EVALUATION WAS PERFORMED  Meredith Staggers 03/27/2020, 8:02 AM

## 2020-03-27 NOTE — Progress Notes (Signed)
Occupational Therapy Session Note  Patient Details  Name: PIETRINA JAGODZINSKI MRN: [id number] Date of Birth: 08-Mar-1940  Today's Date: 03/27/2020 OT Individual Time: 1000-1100 OT Individual Time Calculation (min): 60 min    Short Term Goals: Week 1:  OT Short Term Goal 1 (Week 1): Pt will complete toilet transfer with close S + AE PRN. OT Short Term Goal 2 (Week 1): Pt will complete shower transfer with CGA + AE PRN. OT Short Term Goal 3 (Week 1): Pt will don pants min A + AE PRN.  Skilled Therapeutic Interventions/Progress Updates:    Pt received in bed agreeable to OT but with pain in R hip. RN alerted and delivers medication. Pt completes ambulatory transfer with PFRW into bathroom with CGA to complete toileting 3/3 components with CGA and grab bar.   Pt completes 3x30 ball toss (chest, bounce, overhead pass) in seated position with 1.5 # wrist weights to improve BUE coordination/strengthening required for BADLs/functional transfers.  Pt completes 2x10-15 dowel rod therex for BUE shoulder strengthening required for BADLs/functional transfers as follows with demo cuing and 3 # dowel rod  Shoulder flex/ext, shoulder press, horizonal ab/adduct Circles in B directions chest press Wrist ext  Exited session with pt seated in bed, exit alarm on and call light in reach   Therapy Documentation Precautions:  Precautions Precautions: Fall Restrictions Weight Bearing Restrictions: Yes RLE Weight Bearing: Weight bearing as tolerated LLE Weight Bearing: Weight bearing as tolerated General:   Vital Signs: Therapy Vitals Temp: 98 F (36.7 C) Pulse Rate: 73 Resp: 18 BP: (!) 148/71 Patient Position (if appropriate): Lying Oxygen Therapy SpO2: 99 % O2 Device: Room Air Pain: Pain Assessment Pain Scale: 0-10 Pain Score: 6  Pain Location: Hip Pain Orientation: Right Pain Descriptors / Indicators: Aching Pain Frequency: Intermittent Pain Onset: On-going Patients Stated Pain Goal:  1 Pain Intervention(s): Medication (See eMAR) ADL: ADL Eating: Supervision/safety Where Assessed-Eating: Edge of bed Grooming: Supervision/safety Where Assessed-Grooming: Sitting at sink,Edge of bed Upper Body Bathing: Supervision/safety Where Assessed-Upper Body Bathing: Edge of bed Lower Body Bathing: Minimal assistance Where Assessed-Lower Body Bathing: Edge of bed Upper Body Dressing: Supervision/safety Where Assessed-Upper Body Dressing: Edge of bed Lower Body Dressing: Moderate assistance Where Assessed-Lower Body Dressing: Edge of bed Toileting: Contact guard Where Assessed-Toileting: Bedside Commode Toilet Transfer: Minimal assistance Toilet Transfer Method: Stand pivot Science writer: Geophysical data processor: Environmental education officer Method: Stand pivot Vision   Perception    Praxis   Exercises:   Other Treatments:     Therapy/Group: Individual Therapy  Tonny Branch 03/27/2020, 6:44 AM

## 2020-03-27 NOTE — Progress Notes (Signed)
Occupational Therapy Session Note  Patient Details  Name: [name] MRN: [id number] Date of Birth: 01/25/1941  Today's Date: 03/27/2020 OT Individual Time: 7741-4239 OT Individual Time Calculation (min): 33 min    Short Term Goals: Week 1:  OT Short Term Goal 1 (Week 1): Pt will complete toilet transfer with close S + AE PRN. OT Short Term Goal 2 (Week 1): Pt will complete shower transfer with CGA + AE PRN. OT Short Term Goal 3 (Week 1): Pt will don pants min A + AE PRN.  Skilled Therapeutic Interventions/Progress Updates:    Pt sitting up in w/c, reports feeling tired and wishing she could go home, but agreeable to OT session.  Pt reports her walk in shower has about a 1 inch threshold to step over and shower door requires pt to side step in with platform RW.  Educated pt on safe transfer technique using visual demonstration simulating using 2 inch frame.  Pt return demonstrated with CGA.  Pt requesting back to bed at end of session.  Stand pivot w/c to EOB using bed rail for balance and close supervision.  Sit to supine with supervision.  Call bell in reach, bed alarm on.  Therapy Documentation Precautions:  Precautions Precautions: Fall Restrictions Weight Bearing Restrictions: Yes RLE Weight Bearing: Weight bearing as tolerated LLE Weight Bearing: Weight bearing as tolerated   Therapy/Group: Individual Therapy  Ezekiel Slocumb 03/27/2020, 3:41 PM

## 2020-03-28 LAB — GLUCOSE, CAPILLARY
Glucose-Capillary: 104 mg/dL — ABNORMAL HIGH (ref 70–99)
Glucose-Capillary: 107 mg/dL — ABNORMAL HIGH (ref 70–99)
Glucose-Capillary: 81 mg/dL (ref 70–99)
Glucose-Capillary: 105 mg/dL — ABNORMAL HIGH (ref 70–99)

## 2020-03-28 NOTE — Progress Notes (Signed)
Physical Therapy Session Note  Patient Details  Name: [name] MRN: [id number] Date of Birth: 1940/09/05  Today's Date: 03/28/2020 PT Individual Time: 1005-1100 and 1403-1500 PT Individual Time Calculation (min): 55 min and 57 min  Short Term Goals: Week 1:  PT Short Term Goal 1 (Week 1): Pt will perform bed mobility with CGA PT Short Term Goal 2 (Week 1): Pt will ambulate 167ft with min assist PT Short Term Goal 3 (Week 1): Pt wil propell WC 185ft with supervision asssit PT Short Term Goal 4 (Week 1): Pt will ascend 4 steps with mod assist and LRAD  Skilled Therapeutic Interventions/Progress Updates:     1st Session: Pt received supine in bed and agrees to therapy. Reports soreness pain in R hip/pelvis. PT provides mobility and rest breaks to manage pain. Supine to sit with verbal cues on sequencing. Pt performs sit to stand with minA and stand step transfer to [facility] with minA and cues on positioning. Stand step transfer to Nustep with minA. Pt performs Nustep for strength and endurance training as well as AAROM to decrease soreness and stiffness in R lower extremity. Pt completes 15:00 at workload of 3 with steps per minute>40. Pt takes several brief rest breaks during activity. Following, pt performs stand pivot transfer with HHA minA from Nustep>WC>mat table. Pt performs multiple reps of sit to stand for strengthening and functional transfer training. Pt completes 1x5 using bilateral upper extremities and CGA, and 1x5 without use of arms, requiring minA from PT. Stand pivot transfer from mat>WC>bed with minA. Left supine with alarm intact and all needs within reach.  2nd Session: Pt received seated in [facility] and agrees to therapy. Report of pain in R hip and pelvis. PT provides rest breaks to manage pain. Husband present for family ed. WC transport to gym for time management. Pt performs car transfer with HHA minA and stand pivot technique. Pt then performs additional rep with husband providing  assistance. Pt ambulates x100' with platform walker with supervision and cues for upright gaze to improve posture and balance and increasing stride length and gait speed to decrease risk for falls. Pt performs x4 6" steps with quad cane in R hand and HHA with L hand, minA. Pt then attempts with husband providing assistance and is able to ascend stairs with same technique, but then says that legs are "giving out", and uses bilateral hand rails to descend stairs. Pt performs stand pivot transfer to mat with HHA minA. Sit to supine with CGA. Pt performs supine therex or strength and ROM for bilateral lower extremities, including 1x10 ankle pumps, quad sets, glute sets, heel slides, hip abduction. Pt defers performing SLRs at this time due to pain and fatigue. Supine to sit with minA. Stand pivot transfer with husband assisting form mat>WC>bed. Pt left supine with alarm intact and all needs within reach.  Therapy Documentation Precautions:  Precautions Precautions: Fall Restrictions Weight Bearing Restrictions: Yes RLE Weight Bearing: Weight bearing as tolerated LLE Weight Bearing: Weight bearing as tolerated   Therapy/Group: Individual Therapy  Breck Coons, PT, DPT 03/28/2020, 4:16 PM

## 2020-03-28 NOTE — Progress Notes (Signed)
PROGRESS NOTE   Subjective/Complaints:  Feeling rough this morning. Hurting all over. Neck sore now too. Thinks she might have overdone it.   ROS: Patient denies fever, rash, sore throat, blurred vision, nausea, vomiting, diarrhea, cough, shortness of breath or chest pain,  headache, or mood change.    .    Objective:   No results found. Recent Labs    03/27/20 0515  WBC 7.1  HGB 10.3*  HCT 31.0*  PLT 270   Recent Labs    03/27/20 0515  NA 139  K 4.1  CL 107  CO2 22  GLUCOSE 92  BUN 17  CREATININE 1.38*  CALCIUM 9.2    Intake/Output Summary (Last 24 hours) at 03/28/2020 0912 Last data filed at 03/28/2020 0745 Gross per 24 hour  Intake 480 ml  Output --  Net 480 ml        Physical Exam: Vital Signs Blood pressure (!) 168/63, pulse 74, temperature 98.8 F (37.1 C), resp. rate 18, height 5' 2.5" (1.588 m), weight 80.5 kg, SpO2 100 %.   Constitutional: appears uncomfortable. Vital signs reviewed. HEENT: EOMI, oral membranes moist Neck: supple Cardiovascular: RRR without murmur. No JVD    Respiratory/Chest: CTA Bilaterally without wheezes or rales. Normal effort    GI/Abdomen: BS +, non-tender, non-distended Ext: no clubbing, cyanosis, or edema Psych: pleasant and cooperative Musculoskeletal:        LB tender with ROM. Posterior neck tender. No frank spasms or TP's appreciated.     Comments: Left hand with edema and tenderness improving. Able to grasp Skin:    Comments: Laceration left 1/2 web---sutures in place. CDI   Neurological:     Comments: Alert and oriented x 3. Normal insight and awareness. Intact Memory. Normal language and speech. Cranial nerve exam unremarkable RUE: 4/5 proximal distal LUE: 3-4/5.  Bilateral lower extremities: 3-to 4/5 proximal distal (pain inhibition ongoing on right side)     Assessment/Plan: 1. Functional deficits which require 3+ hours per day of interdisciplinary  therapy in a comprehensive inpatient rehab setting.  Physiatrist is providing close team supervision and 24 hour management of active medical problems listed below.  Physiatrist and rehab team continue to assess barriers to discharge/monitor patient progress toward functional and medical goals  Care Tool:  Bathing    Body parts bathed by patient: Right arm,Face,Left arm,Chest,Abdomen,Front perineal area,Buttocks,Right upper leg,Left upper leg,Right lower leg,Left lower leg         Bathing assist Assist Level: Minimal Assistance - Patient > 75%     Upper Body Dressing/Undressing Upper body dressing   What is the patient wearing?: Pull over shirt    Upper body assist Assist Level: Supervision/Verbal cueing    Lower Body Dressing/Undressing Lower body dressing      What is the patient wearing?: Underwear/pull up,Pants     Lower body assist Assist for lower body dressing: Minimal Assistance - Patient > 75%     Toileting Toileting    Toileting assist Assist for toileting: Minimal Assistance - Patient > 75%     Transfers Chair/bed transfer  Transfers assist     Chair/bed transfer assist level: Contact Guard/Touching assist  Locomotion Ambulation   Ambulation assist      Assist level: Contact Guard/Touching assist Assistive device: Parallel bars Max distance: 30'   Walk 10 feet activity   Assist     Assist level: Contact Guard/Touching assist Assistive device: Parallel bars   Walk 50 feet activity   Assist Walk 50 feet with 2 turns activity did not occur: Safety/medical concerns  Assist level: Minimal Assistance - Patient > 75% Assistive device: Walker-platform    Walk 150 feet activity   Assist Walk 150 feet activity did not occur: Safety/medical concerns  Assist level: Minimal Assistance - Patient > 75% Assistive device: Walker-platform    Walk 10 feet on uneven surface  activity   Assist Walk 10 feet on uneven surfaces  activity did not occur: Safety/medical concerns         Wheelchair     Assist   Type of Wheelchair: Manual    Wheelchair assist level: Minimal Assistance - Patient > 75% Max wheelchair distance: 150    Wheelchair 50 feet with 2 turns activity    Assist        Assist Level: Minimal Assistance - Patient > 75%   Wheelchair 150 feet activity     Assist      Assist Level: Minimal Assistance - Patient > 75%   Blood pressure (!) 168/63, pulse 74, temperature 98.8 F (37.1 C), resp. rate 18, height 5' 2.5" (1.588 m), weight 80.5 kg, SpO2 100 %.  Medical Problem List and Plan: 1.  Pain in RLE and left hand, balance deficits, decreased endurance secondary to polytrauma with TBI.             -patient may shower             -ELOS/Goals: 04/02/20/supervision/min A.             -Continue CIR therapies including PT, OT, and SLP  2.  Antithrombotics: -DVT/anticoagulation:  Mechanical: Sequential compression devices, below knee Bilateral lower extremities  Dopplers clear- con't SCDs for now as she's ambulating             -antiplatelet therapy: N/A 3. Chronic LBP/Pain Management: Used to be managed by ESI every 3 months. Followed by Dr. Nelva Bush.             Continue hydrocodone prn              3/3 generalized pain likely d/t increasing physical activity   -see nothing concerning on exam   -will order Kpad to help provide local relief to neck/back 4. Mood: LCSW to follow for evaluation and support.              -antipsychotic agents: N/A 5. Neuropsych: This patient is partially capable of making decisions on her own behalf. 6. Skin/Wound Care: Routine pressure relief measure.  7. Fluids/Electrolytes/Nutrition: Monitor I/Os             Continue supplements to help with protein stores.        8. T2DM with hyperglycemia: Monitor BS ac/hs and use SSI for elevated BS.   03/27/20 cbg's reasnonably controlled   hgb A1C is 7.1             Continue amaryl 1 mg bid.                9. Right acetabular wall Fx/Pelvic Fx: Weightbearing as tolerated 10. Left hand laceration: Continue to monitor for healing---remove sutures soon 11. Hypokalemia: Was on Kdur tid at home-->has been  resumed with improvement.   2/28- K+ holding at 3.7  - con't daily Kdur 12. Leucocytosis:      resolved               13. ABLA: Hgb up to 10.3 3/2!  -stool OB -, no signs of gross blood loss 14. HTN: Monitor BP tid--continue Enalapril, HCTZ (held) and meotprolol.              3/1 bp control better today. Continue above regimen 15. H/o Depression: Managed on Lexapro.  16. Hypoxia: resolved.  17. Prerenal azotemia:  -BUN 19/1.45 --> 17/1.38 3/2 ==continue to hold HCTZ  -recheck labs Monday  -continue to encourage po intake LOS: 6 days A FACE TO FACE EVALUATION WAS PERFORMED  Meredith Staggers 03/28/2020, 9:12 AM

## 2020-03-28 NOTE — Progress Notes (Signed)
Occupational Therapy Session Note  Patient Details  Name: [name] MRN: [id number] Date of Birth: [birth year]  Today's Date: 03/28/2020 OT Individual Time: 1300-1345 OT Individual Time Calculation (min): 45 min  and Today's Date: 03/28/2020 OT Missed Time: 15 Minutes Missed Time Reason: Patient fatigue;Pain  Short Term Goals: Week 1:  OT Short Term Goal 1 (Week 1): Pt will complete toilet transfer with close S + AE PRN. OT Short Term Goal 2 (Week 1): Pt will complete shower transfer with CGA + AE PRN. OT Short Term Goal 3 (Week 1): Pt will don pants min A + AE PRN.  Skilled Therapeutic Interventions/Progress Updates:    Patient greeted semi-reclined in bed with spouse present. Pt reports she had a tough morning with pain and nausea. Pt reported feeling a little better now, but requested more pain medicine. Nursing notified and administered meds. Pt completed bed mobility with supervision. Sit<>stand with platform RW and CGA. Close supervision to ambulate to wc at the door. OT propelled wc to therapy apartment and set-up walk-in shower transfer in simulated home environment. Educated on use of BSC in shower as a shower seat. Practiced side stepping into shower using RW and grab bar. Pt's spouse reported understanding and felt comfortable with transfer. Pt reported feeling fatigued and continued pain. Pt returned to room and ambulated into bathroom with close supervision and platform RW. Pt able to manage clothing, void bladder, and complete toileting with supervision. Pt stood at the sink to wash hands with verba cues for RW placement. Pt requested to stay up in wc. Pt left seated in wc with alarm pad on, call bell in reach, and needs met.   Therapy Documentation Precautions:  Precautions Precautions: Fall Restrictions Weight Bearing Restrictions: Yes RLE Weight Bearing: Weight bearing as tolerated LLE Weight Bearing: Weight bearing as tolerated Pain: Pain Assessment Pain Scale:  0-10 Pain Score: 6  Pain Type: Acute pain Pain Location: Hip Pain Orientation: Right;Left Pain Descriptors / Indicators: Aching Pain Intervention(s): repositioned, rest   Therapy/Group: Individual Therapy  Valma Cava 03/28/2020, 1:51 PM

## 2020-03-29 LAB — GLUCOSE, CAPILLARY
Glucose-Capillary: 98 mg/dL (ref 70–99)
Glucose-Capillary: 64 mg/dL — ABNORMAL LOW (ref 70–99)
Glucose-Capillary: 126 mg/dL — ABNORMAL HIGH (ref 70–99)
Glucose-Capillary: 104 mg/dL — ABNORMAL HIGH (ref 70–99)
Glucose-Capillary: 156 mg/dL — ABNORMAL HIGH (ref 70–99)

## 2020-03-29 NOTE — Progress Notes (Signed)
Sutures to left thumb removed per order.  11 sutures total were removed.  Steri strips and foam dressing applied.

## 2020-03-29 NOTE — Progress Notes (Signed)
Occupational Therapy Session Note  Patient Details  Name: [name] MRN: [id number] Date of Birth: 1941-01-12  Today's Date: 03/29/2020 OT Individual Time: 1211-1305 OT Individual Time Calculation (min): 54 min   Short Term Goals: Week 1:  OT Short Term Goal 1 (Week 1): Pt will complete toilet transfer with close S + AE PRN. OT Short Term Goal 2 (Week 1): Pt will complete shower transfer with CGA + AE PRN. OT Short Term Goal 3 (Week 1): Pt will don pants min A + AE PRN.  Skilled Therapeutic Interventions/Progress Updates:    Pt greeted semi-reclined in bed and agreeable to OT treatment session. Pt requesting to shower today. Pt completed sit<>stand with platform RW and close supervision. Education provided for RW positioning to access [employer] and collect clothing. Pt then ambulated into bathroom with close supervision and sat on commode. Pt able to void bladder and complete peri-care. Pt then performed bathing tasks from [facility] in shower with supervision overall. Pt then ambualted back out of shower and completed dressing tasks from EOB with set-up A. Nursing reported pt's blood sugar was low, so pt ate grham crackers and drank a soda.  OT provided pt with RW bag and educated on functional uses. Pt then ambulated to the nurses station and back with close supervision. Pt needed 1 standing rest break 2/2 fatigue. Pt sat EOB to rest, then returned to supine with close supervision. Pt left in supine with bed alarm on, husband present, and needs met.   Therapy Documentation Precautions:  Precautions Precautions: Fall Restrictions Weight Bearing Restrictions: Yes RLE Weight Bearing: Weight bearing as tolerated LLE Weight Bearing: Weight bearing as tolerated Pain: Pain Assessment Pain Scale: 0-10 Pain Score: 3  Pain Type: Acute pain Pain Location: Hip Pain Intervention(s): Repositioned   Therapy/Group: Individual Therapy  Valma Cava 03/29/2020, 12:35 PM

## 2020-03-29 NOTE — Progress Notes (Signed)
Physical Therapy Session Note  Patient Details  Name: [name] MRN: [id number] Date of Birth: 10-19-1940  Today's Date: 03/29/2020 PT Individual Time: 4944-9675 and 1002-1100 PT Individual Time Calculation (min): 43 min and 58 min  Short Term Goals: Week 1:  PT Short Term Goal 1 (Week 1): Pt will perform bed mobility with CGA PT Short Term Goal 2 (Week 1): Pt will ambulate 165ft with min assist PT Short Term Goal 3 (Week 1): Pt wil propell WC 187ft with supervision asssit PT Short Term Goal 4 (Week 1): Pt will ascend 4 steps with mod assist and LRAD  Skilled Therapeutic Interventions/Progress Updates:     1st Session: Pt received supine in bed and agrees to therapy. Reports pain in R hip is "not too bad". Number not provided. PT provides rest breaks as needed and mobility to manage pain. Pt performs supine to sit from elevated bed without bedrails with verbal cues for sequencing. Stand pivot transfer to [facility] without AD and with CGA from PT. WC transport to gym for time management. Stand step transfer to mat table with CGA. Pt performs seated exercises to engage core, including 1x10 bilateral arm raises with 4lb bar, trunk rotations holding bar vertically with shoulders held at 90 degrees and elbows extended, and seated "deadlifts", lowering bar down anterior shins with flat lower back and then using trunk extension to return to upright position. Pt transitions this movement into performing 1x10 sit to stand transfers holding 4lb bar in front of body, focusing on body mechanics and anterior weight shift. PT provides CGA for sit to stand transfers. Stand step transfer from mat>WC>bed with CGA and no AD. Left supine with alarm intact and all needs within reach.  2nd Session: Pt received seated at EOB. Agrees to therapy. Reports 5/10 pain in R hip and pelvis. PT provides rest breaks as needed to manage pain. Stand pivot transfer to [facility] with CGA. WC transport to gym for time management. Pt completes  seated therex for bilateral lower extremity strength, including 2x10 LAQs and total leg extensions with level 3 theraband resistance. PT provides verbal and tactile cues for correct performance to target desired muscle groups. Pt then completes stair training with quad cane in R hand and HHA on L side. Pt completes 2x4 6" steps with minA form PT. PT provides multimodal cues for sequencing and step placement. Seated rest break between bouts. PT provides demo of improved posture and sequencing of cane management and pt trials x4 stairs again with improved safety and cane stability. Pt then performs Nustep x10:00 at workload of 6 with average steps per minute >50. Performs for strengthening and endurance training. Stand step transfer back to bed with CGA. Left supine in bed with alarm intact and all needs within reach.  Therapy Documentation Precautions:  Precautions Precautions: Fall Restrictions Weight Bearing Restrictions: Yes RLE Weight Bearing: Weight bearing as tolerated LLE Weight Bearing: Weight bearing as tolerated    Therapy/Group: Individual Therapy  Breck Coons, PT, DPT 03/29/2020, 4:31 PM

## 2020-03-29 NOTE — Progress Notes (Signed)
PROGRESS NOTE   Subjective/Complaints:  Overall pt feeling much better this morning. Did experience a lot of pain when she was laying on her left side last night. Pain relieved by changing position. Not having any persistent, severe pain while up moving around  ROS: Patient denies fever, rash, sore throat, blurred vision, nausea, vomiting, diarrhea, cough, shortness of breath or chest pain,   headache, or mood change.    .    Objective:   No results found. Recent Labs    03/27/20 0515  WBC 7.1  HGB 10.3*  HCT 31.0*  PLT 270   Recent Labs    03/27/20 0515  NA 139  K 4.1  CL 107  CO2 22  GLUCOSE 92  BUN 17  CREATININE 1.38*  CALCIUM 9.2    Intake/Output Summary (Last 24 hours) at 03/29/2020 1014 Last data filed at 03/29/2020 0730 Gross per 24 hour  Intake 940 ml  Output -  Net 940 ml        Physical Exam: Vital Signs Blood pressure 139/64, pulse 70, temperature 97.9 F (36.6 C), resp. rate 20, height 5' 2.5" (1.588 m), weight 80.5 kg, SpO2 98 %.   Constitutional: No distress . Vital signs reviewed. HEENT: EOMI, oral membranes moist Neck: supple Cardiovascular: RRR without murmur. No JVD    Respiratory/Chest: CTA Bilaterally without wheezes or rales. Normal effort    GI/Abdomen: BS +, non-tender, non-distended Ext: no clubbing, cyanosis, or edema Psych: pleasant and cooperative Musculoskeletal:        LB tender with ROM.  Pelvic pain with AROM/PROM BLE    Comments: Left hand with edema and tenderness improving. Able to grasp Skin:    Comments: Laceration left 1/2 web---sutures in place. CDI   -a few scattered bruises  Neurological:     Comments: Alert and oriented x 3. Normal insight and awareness. Intact Memory. Normal language and speech. Cranial nerve exam unremarkable  RUE: 4/5 proximal distal LUE: 4/5 except for grip.  Bilateral lower extremities: 3-to 4/5 proximal distal (pain inhibition  ongoing on R>L)     Assessment/Plan: 1. Functional deficits which require 3+ hours per day of interdisciplinary therapy in a comprehensive inpatient rehab setting.  Physiatrist is providing close team supervision and 24 hour management of active medical problems listed below.  Physiatrist and rehab team continue to assess barriers to discharge/monitor patient progress toward functional and medical goals  Care Tool:  Bathing    Body parts bathed by patient: Right arm,Face,Left arm,Chest,Abdomen,Front perineal area,Buttocks,Right upper leg,Left upper leg,Right lower leg,Left lower leg         Bathing assist Assist Level: Minimal Assistance - Patient > 75%     Upper Body Dressing/Undressing Upper body dressing   What is the patient wearing?: Pull over shirt    Upper body assist Assist Level: Supervision/Verbal cueing    Lower Body Dressing/Undressing Lower body dressing      What is the patient wearing?: Underwear/pull up,Pants     Lower body assist Assist for lower body dressing: Minimal Assistance - Patient > 75%     Toileting Toileting    Toileting assist Assist for toileting: Minimal Assistance - Patient > 75%  Transfers Chair/bed transfer  Transfers assist     Chair/bed transfer assist level: Contact Guard/Touching assist     Locomotion Ambulation   Ambulation assist      Assist level: Supervision/Verbal cueing Assistive device: Walker-platform Max distance: 100'   Walk 10 feet activity   Assist     Assist level: Supervision/Verbal cueing Assistive device: Walker-platform   Walk 50 feet activity   Assist Walk 50 feet with 2 turns activity did not occur: Safety/medical concerns  Assist level: Supervision/Verbal cueing Assistive device: Walker-platform    Walk 150 feet activity   Assist Walk 150 feet activity did not occur: Safety/medical concerns  Assist level: Minimal Assistance - Patient > 75% Assistive device:  Walker-platform    Walk 10 feet on uneven surface  activity   Assist Walk 10 feet on uneven surfaces activity did not occur: Safety/medical concerns         Wheelchair     Assist   Type of Wheelchair: Manual    Wheelchair assist level: Minimal Assistance - Patient > 75% Max wheelchair distance: 150    Wheelchair 50 feet with 2 turns activity    Assist        Assist Level: Minimal Assistance - Patient > 75%   Wheelchair 150 feet activity     Assist      Assist Level: Minimal Assistance - Patient > 75%   Blood pressure 139/64, pulse 70, temperature 97.9 F (36.6 C), resp. rate 20, height 5' 2.5" (1.588 m), weight 80.5 kg, SpO2 98 %.  Medical Problem List and Plan: 1.  Pain in RLE and left hand, balance deficits, decreased endurance secondary to polytrauma with TBI.             -patient may shower             -ELOS/Goals: 04/02/20, supervision/min A.             -Continue CIR therapies including PT, OT, and SLP  2.  Antithrombotics: -DVT/anticoagulation:  Mechanical: Sequential compression devices, below knee Bilateral lower extremities  Dopplers clear- con't SCDs for now as she's ambulating             -antiplatelet therapy: N/A 3. Chronic LBP/Pain Management: Used to be managed by ESI every 3 months. Followed by Dr. Nelva Bush.             Continue hydrocodone prn, kpad             3/4 discussed patterns and time course of pelvic pain after fx's such as hers. She'll have to find positions which work for her in bed/sitting. If pain is increasing or persistent with activity, consider f/u xrays. At this point not indicated 4. Mood: LCSW to follow for evaluation and support.              -antipsychotic agents: N/A 5. Neuropsych: This patient is partially capable of making decisions on her own behalf. 6. Skin/Wound Care: Routine pressure relief measure.  7. Fluids/Electrolytes/Nutrition: Monitor I/Os             Continue supplements to help with protein  stores.        8. T2DM with hyperglycemia: Monitor BS ac/hs and use SSI for elevated BS.   03/29/20 cbg's reasnonably controlled   hgb A1C is 7.1             Continue amaryl 1 mg bid.               9. Right  acetabular wall Fx/Pelvic Fx: Weightbearing as tolerated 10. Left hand laceration: Continue to monitor for healing---remove sutures soon 11. Hypokalemia: Was on Kdur tid at home-->has been resumed with improvement.   3/2 K+ 4.1- con't daily Kdur 12. Leucocytosis:      resolved               13. ABLA: Hgb up to 10.3 3/2!  -stool OB -, no signs of gross blood loss  -recheck 3/7 14. HTN: Monitor BP tid--continue Enalapril, HCTZ (held) and meotprolol.              3/1 bp control better today. Continue above regimen 15. H/o Depression: Managed on Lexapro.  16. Hypoxia: resolved.  17. Prerenal azotemia:  -BUN 19/1.45 --> 17/1.38 3/2 ==continue to hold HCTZ  -recheck labs Monday  - encourage po intake LOS: 7 days A FACE TO FACE EVALUATION WAS PERFORMED  Meredith Staggers 03/29/2020, 10:14 AM

## 2020-03-29 NOTE — Progress Notes (Signed)
Hypoglycemic Event  CBG: 64  Treatment: 4 oz juice/soda  Symptoms: None  Follow-up CBG: Time:1308 CBG Result:126  Possible Reasons for Event: Unknown  Comments/MD notified: Hypoglycemic protocol followed per order    Levie Heritage

## 2020-03-30 LAB — GLUCOSE, CAPILLARY
Glucose-Capillary: 95 mg/dL (ref 70–99)
Glucose-Capillary: 88 mg/dL (ref 70–99)
Glucose-Capillary: 93 mg/dL (ref 70–99)
Glucose-Capillary: 136 mg/dL — ABNORMAL HIGH (ref 70–99)

## 2020-03-30 NOTE — Plan of Care (Signed)
  Problem: Consults Goal: RH GENERAL PATIENT EDUCATION Description: See Patient Education module for education specifics. Outcome: Progressing   Problem: RH BOWEL ELIMINATION Goal: RH STG MANAGE BOWEL WITH ASSISTANCE Description: STG Manage Bowel with Min Assistance. Outcome: Progressing   Problem: RH BLADDER ELIMINATION Goal: RH STG MANAGE BLADDER WITH ASSISTANCE Description: STG Manage Bladder With Min Assistance Outcome: Progressing   Problem: RH PAIN MANAGEMENT Goal: RH STG PAIN MANAGED AT OR BELOW PT'S PAIN GOAL Description: Less than 4 Outcome: Progressing

## 2020-03-31 LAB — GLUCOSE, CAPILLARY
Glucose-Capillary: 96 mg/dL (ref 70–99)
Glucose-Capillary: 79 mg/dL (ref 70–99)
Glucose-Capillary: 119 mg/dL — ABNORMAL HIGH (ref 70–99)
Glucose-Capillary: 130 mg/dL — ABNORMAL HIGH (ref 70–99)

## 2020-03-31 NOTE — Progress Notes (Signed)
Occupational Therapy Session Note  Patient Details  Name: [name] MRN: [id number] Date of Birth: 1940-11-29  Today's Date: 03/31/2020 OT Individual Time: 1406-1500 OT Individual Time Calculation (min): 54 min    Short Term Goals: Week 1:  OT Short Term Goal 1 (Week 1): Pt will complete toilet transfer with close S + AE PRN. OT Short Term Goal 2 (Week 1): Pt will complete shower transfer with CGA + AE PRN. OT Short Term Goal 3 (Week 1): Pt will don pants min A + AE PRN.  Skilled Therapeutic Interventions/Progress Updates:    Pt supine with no c/o pain since last pain medication admin. Pt completed ambulatory transfer to the bathroom with supervision using the platform RW. Pt completed toileting tasks with supervision overall. Pt returned to her w/c and was taken to the therapy gym. She transferred to the mat with supervision. Pt completed 3x 10 no UE supported sit <> stands to simulate home transfers and strengthen BLE. 3x10 resistive forward and overhead press in standing with a 1 kg medicine ball. Pt completed dynamic core stabilization exercise with BUE coordination in standing with close supervision to CGA. Lastly pt completed 3x 10 mini squats with CGA, cueing for technique. Pt returned to her w/c and was taken back to her room. Pt left supine with all needs met, bed alarm set.   Therapy Documentation Precautions:  Precautions Precautions: Fall Restrictions Weight Bearing Restrictions: No RLE Weight Bearing: Weight bearing as tolerated LLE Weight Bearing: Weight bearing as tolerated   Therapy/Group: Individual Therapy  Curtis Sites 03/31/2020, 6:22 AM

## 2020-03-31 NOTE — Plan of Care (Signed)
  Problem: Consults Goal: RH GENERAL PATIENT EDUCATION Description: See Patient Education module for education specifics. Outcome: Progressing   Problem: RH BOWEL ELIMINATION Goal: RH STG MANAGE BOWEL WITH ASSISTANCE Description: STG Manage Bowel with Min Assistance. Outcome: Progressing   Problem: RH BLADDER ELIMINATION Goal: RH STG MANAGE BLADDER WITH ASSISTANCE Description: STG Manage Bladder With Min Assistance Outcome: Progressing   Problem: RH PAIN MANAGEMENT Goal: RH STG PAIN MANAGED AT OR BELOW PT'S PAIN GOAL Description: Less than 4 Outcome: Progressing

## 2020-03-31 NOTE — Progress Notes (Signed)
PROGRESS NOTE   Subjective/Complaints:  No issues overnight, she remembers that Dr. Eda Keys was out next week.  States that appetite is good.  ROS: Patient denies chest pain shortness of breath nausea vomiting diarrhea constipation .    Objective:   No results found. No results for input(s): WBC, HGB, HCT, PLT in the last 72 hours. No results for input(s): NA, K, CL, CO2, GLUCOSE, BUN, CREATININE, CALCIUM in the last 72 hours.  Intake/Output Summary (Last 24 hours) at 03/31/2020 1059 Last data filed at 03/30/2020 1808 Gross per 24 hour  Intake 400 ml  Output --  Net 400 ml        Physical Exam: Vital Signs Blood pressure (!) 139/53, pulse 69, temperature 97.8 F (36.6 C), temperature source Oral, resp. rate 16, height 5' 2.5" (1.588 m), weight 80.5 kg, SpO2 98 %.  General: No acute distress Mood and affect are appropriate Heart: Regular rate and rhythm no rubs murmurs or extra sounds Lungs: Clear to auscultation, breathing unlabored, no rales or wheezes Abdomen: Positive bowel sounds, soft nontender to palpation, nondistended Extremities: No clubbing, cyanosis, or edema  Musculoskeletal:        LB tender with ROM.  Pelvic pain with AROM/PROM BLE    Comments: Left hand with edema and tenderness improving. Able to grasp Skin:    Comments: Laceration left 1/2 web---sutures in place. CDI   -a few scattered bruises  Neurological:     Comments: Alert and oriented x 3. Normal insight and awareness. Intact Memory. Normal language and speech. Cranial nerve exam unremarkable  RUE: 4/5 proximal distal LUE: 4/5 except for grip.  Bilateral lower extremities: 3-to 4/5 proximal distal (pain inhibition ongoing on R>L)     Assessment/Plan: 1. Functional deficits which require 3+ hours per day of interdisciplinary therapy in a comprehensive inpatient rehab setting.  Physiatrist is providing close team supervision and 24 hour  management of active medical problems listed below.  Physiatrist and rehab team continue to assess barriers to discharge/monitor patient progress toward functional and medical goals  Care Tool:  Bathing    Body parts bathed by patient: Right arm,Face,Left arm,Chest,Abdomen,Front perineal area,Buttocks,Right upper leg,Left upper leg,Right lower leg,Left lower leg         Bathing assist Assist Level: Contact Guard/Touching assist     Upper Body Dressing/Undressing Upper body dressing   What is the patient wearing?: Pull over shirt    Upper body assist Assist Level: Supervision/Verbal cueing    Lower Body Dressing/Undressing Lower body dressing      What is the patient wearing?: Underwear/pull up,Pants     Lower body assist Assist for lower body dressing: Contact Guard/Touching assist     Toileting Toileting    Toileting assist Assist for toileting: Contact Guard/Touching assist     Transfers Chair/bed transfer  Transfers assist     Chair/bed transfer assist level: Contact Guard/Touching assist     Locomotion Ambulation   Ambulation assist      Assist level: Supervision/Verbal cueing Assistive device: Walker-platform Max distance: 100'   Walk 10 feet activity   Assist     Assist level: Supervision/Verbal cueing Assistive device: Walker-platform   Walk 50  feet activity   Assist Walk 50 feet with 2 turns activity did not occur: Safety/medical concerns  Assist level: Supervision/Verbal cueing Assistive device: Walker-platform    Walk 150 feet activity   Assist Walk 150 feet activity did not occur: Safety/medical concerns  Assist level: Minimal Assistance - Patient > 75% Assistive device: Walker-platform    Walk 10 feet on uneven surface  activity   Assist Walk 10 feet on uneven surfaces activity did not occur: Safety/medical concerns         Wheelchair     Assist   Type of Wheelchair: Manual    Wheelchair assist level:  Minimal Assistance - Patient > 75% Max wheelchair distance: 150    Wheelchair 50 feet with 2 turns activity    Assist        Assist Level: Minimal Assistance - Patient > 75%   Wheelchair 150 feet activity     Assist      Assist Level: Minimal Assistance - Patient > 75%   Blood pressure (!) 139/53, pulse 69, temperature 97.8 F (36.6 C), temperature source Oral, resp. rate 16, height 5' 2.5" (1.588 m), weight 80.5 kg, SpO2 98 %.  Medical Problem List and Plan: 1.  Pain in RLE and left hand, balance deficits, decreased endurance secondary to polytrauma with TBI.             -patient may shower             -ELOS/Goals: 04/02/20, supervision/min A.             -Continue CIR therapies including PT, OT, and SLP  2.  Antithrombotics: -DVT/anticoagulation:  Mechanical: Sequential compression devices, below knee Bilateral lower extremities  Dopplers clear- con't SCDs for now as she's ambulating             -antiplatelet therapy: N/A 3. Chronic LBP/Pain Management: Used to be managed by ESI every 3 months. Followed by Dr. Nelva Bush.             Continue hydrocodone prn, kpad no pain complaints on 3/6             3/4 discussed patterns and time course of pelvic pain after fx's such as hers. She'll have to find positions which work for her in bed/sitting. If pain is increasing or persistent with activity, consider f/u xrays. At this point not indicated 4. Mood: LCSW to follow for evaluation and support.              -antipsychotic agents: N/A 5. Neuropsych: This patient is partially capable of making decisions on her own behalf. 6. Skin/Wound Care: Routine pressure relief measure.  7. Fluids/Electrolytes/Nutrition: Monitor I/Os             Continue supplements to help with protein stores.        8. T2DM with hyperglycemia: Monitor BS ac/hs and use SSI for elevated BS.   03/29/20 cbg's reasnonably controlled   hgb A1C is 7.1             Continue amaryl 1 mg bid.              CBG (last  3)  Recent Labs    03/30/20 1614 03/30/20 2102 03/31/20 0621  GLUCAP 93 136* 96     9. Right acetabular wall Fx/Pelvic Fx: Weightbearing as tolerated 10. Left hand laceration: Continue to monitor for healing---remove sutures soon 11. Hypokalemia: Was on Kdur tid at home-->has been resumed with improvement.   3/2 K+  4.1- con't daily Kdur 12. Leucocytosis:      resolved               13. ABLA: Hgb up to 10.3 3/2!  -stool OB -, no signs of gross blood loss  -recheck 3/7 14. HTN: Monitor BP tid--continue Enalapril, HCTZ (held) and meotprolol.              3/1 bp control better today. Continue above regimen Vitals:   03/30/20 2029 03/31/20 0341  BP: 135/63 (!) 139/53  Pulse: 70 69  Resp: 20 16  Temp: 98.6 F (37 C) 97.8 F (36.6 C)  SpO2: 97% 98%  BP controlled 3/6 15. H/o Depression: Managed on Lexapro.  16. Hypoxia: resolved.  17. Prerenal azotemia:  -BUN 19/1.45 --> 17/1.38 3/2 ==continue to hold HCTZ  -recheck labs Monday  - encourage po intake LOS: 9 days A FACE TO FACE EVALUATION WAS PERFORMED  Charlett Blake 03/31/2020, 10:59 AM

## 2020-03-31 NOTE — Progress Notes (Signed)
Physical Therapy Session Note  Patient Details  Name: [name] MRN: [id number] Date of Birth: [birth year]  Today's Date: 03/31/2020 PT Individual Time: 0902-0959 PT Individual Time Calculation (min): 57 min   Short Term Goals: Week 1:  PT Short Term Goal 1 (Week 1): Pt will perform bed mobility with CGA PT Short Term Goal 2 (Week 1): Pt will ambulate 14ft with min assist PT Short Term Goal 3 (Week 1): Pt wil propell WC 152ft with supervision asssit PT Short Term Goal 4 (Week 1): Pt will ascend 4 steps with mod assist and LRAD  Skilled Therapeutic Interventions/Progress Updates:     Pt received supine in bed and agrees to therapy. Reports pain in R hip and pelvis. Number not provided. PT provides mobility and rest breaks to manage pain. Pt begins session with supine therex for bilateral lower extremity strengthening. Pt completes 1x10 ankle pumps, quad sets, glute sets, hip abduction, and SLRs (AROM). Supine to sit with bed features at mod(I). Pt performs stand pivot transfer to [facility] with CGA and no AD. WC transport to atrium for gait training in community setting. Pt ambulates 150' x2, sitting on low surfaces and performing multiple reps of sit to stand with cues on sequencing and hand placement. Pt then ambulates >400', navigating elevator and performing tigh turns around obstacles in crowded environment.  Pt performs gait training indoors, working on Arts development officer and increased gait speed. Pt ambulates 54' at self selected gait speed, ambulating at 1.2 ft/sec. PT educates on benefits of increasing gait speed and cues pt to ambulate "as quickly and safely as possible." Pt then ambulates same 36' with gait speed of 1.8 ft/sec. Pt ambulates final bout of 43' with gait speed of 1.84 ft/sec. Following seated rest break, pt ambulates x200' back to room with close supervision. Sit to supine mod(I). Pt left supine in bed with alarm intact and all needs within reach.  Therapy  Documentation Precautions:  Precautions Precautions: Fall Restrictions Weight Bearing Restrictions: No RLE Weight Bearing: Weight bearing as tolerated LLE Weight Bearing: Weight bearing as tolerated    Therapy/Group: Individual Therapy  Breck Coons, PT, DPT 03/31/2020, 3:57 PM

## 2020-04-01 DIAGNOSIS — F4310 Post-traumatic stress disorder, unspecified: Secondary | ICD-10-CM

## 2020-04-01 DIAGNOSIS — S069X0S Unspecified intracranial injury without loss of consciousness, sequela: Secondary | ICD-10-CM

## 2020-04-01 LAB — BASIC METABOLIC PANEL
Anion gap: 9 (ref 5–15)
BUN: 16 mg/dL (ref 8–23)
CO2: 23 mmol/L (ref 22–32)
Calcium: 9.5 mg/dL (ref 8.9–10.3)
Chloride: 106 mmol/L (ref 98–111)
Creatinine, Ser: 1.38 mg/dL — ABNORMAL HIGH (ref 0.44–1.00)
GFR, Estimated: 39 mL/min — ABNORMAL LOW (ref >60)
Glucose, Bld: 98 mg/dL (ref 70–99)
Potassium: 4.6 mmol/L (ref 3.5–5.1)
Sodium: 138 mmol/L (ref 135–145)

## 2020-04-01 LAB — CBC
HCT: 32.9 % — ABNORMAL LOW (ref 36.0–46.0)
Hemoglobin: 10.4 g/dL — ABNORMAL LOW (ref 12.0–15.0)
MCH: 28.3 pg (ref 26.0–34.0)
MCHC: 31.6 g/dL (ref 30.0–36.0)
MCV: 89.6 fL (ref 80.0–100.0)
Platelets: 383 10*3/uL (ref 150–400)
RBC: 3.67 MIL/uL — ABNORMAL LOW (ref 3.87–5.11)
RDW: 14.4 % (ref 11.5–15.5)
WBC: 8.1 10*3/uL (ref 4.0–10.5)
nRBC: 0 % (ref 0.0–0.2)

## 2020-04-01 LAB — GLUCOSE, CAPILLARY
Glucose-Capillary: 97 mg/dL (ref 70–99)
Glucose-Capillary: 84 mg/dL (ref 70–99)
Glucose-Capillary: 78 mg/dL (ref 70–99)
Glucose-Capillary: 116 mg/dL — ABNORMAL HIGH (ref 70–99)

## 2020-04-01 NOTE — Progress Notes (Signed)
Occupational Therapy Discharge Summary  Patient Details  Name: [name] MRN: [id number] Date of Birth: 1940/12/13  Today's Date: 04/01/2020 OT Individual Time: 0815-0900 OT Individual Time Calculation (min): 45 min    Pt greeted semi-reclined in bed and agreeable to OT treatment session. PT ambulated to therapy gym with platform RW and supervision. Worked on ambulating to collect items in gym and place in RW bag to simulate tasks at home. Pt stated that she felt ready for dc tomorrow, but was anxious about getting back into a car. Neuropsych to see patient this morning. OT provided encouragement to patient. Educated on L hand fine motor tasks and working on pinch strength using soft tan theraputty. Pt ambulated back to room in similar fashion and left semi-reclined in bed with needs met.   Patient has met 14 of 14 long term goals due to improved activity tolerance, improved balance, postural control and ability to compensate for deficits.  Patient to discharge at overall Supervision level.  Patient's care partner is independent to provide the necessary physical assistance at discharge for higher level iADL tasks.    Reasons goals not met: n/a  Recommendation:  Patient will benefit from ongoing skilled OT services in outpatient setting to continue to advance functional skills in the area of BADL.  Equipment: No equipment provided  Reasons for discharge: treatment goals met and discharge from hospital  Patient/family agrees with progress made and goals achieved: Yes  OT Discharge Precautions/Restrictions  Precautions Precautions: Fall Restrictions Weight Bearing Restrictions: No RLE Weight Bearing: Weight bearing as tolerated LLE Weight Bearing: Weight bearing as tolerated Pain Pain Assessment Pain Scale: 0-10 Pain Score: 6  Pain Type: Acute pain Pain Location: Groin Pain Orientation: Left Pain Descriptors / Indicators: Aching Pain Frequency: Intermittent Pain Onset:  On-going Pain Intervention(s): Rest, repositioned ADL ADL Eating: Independent Where Assessed-Eating: Edge of bed Grooming: Independent Where Assessed-Grooming: Sitting at sink,Edge of bed Upper Body Bathing: Modified independent Where Assessed-Upper Body Bathing: Edge of bed Lower Body Bathing: Supervision/safety Where Assessed-Lower Body Bathing: Edge of bed Upper Body Dressing: Modified independent (Device) Where Assessed-Upper Body Dressing: Edge of bed Lower Body Dressing: Supervision/safety Where Assessed-Lower Body Dressing: Edge of bed Toileting: Supervision/safety Where Assessed-Toileting: Bedside Commode Toilet Transfer: Distant supervision Toilet Transfer Method: Stand pivot Science writer: Geophysical data processor: Close supervision Social research officer, government Method: Stand pivot Perception  Perception: Within Functional Limits Praxis Praxis: Intact Cognition Overall Cognitive Status: Within Functional Limits for tasks assessed Arousal/Alertness: Awake/alert Orientation Level: Oriented X4 Alternating Attention: Appears intact Divided Attention: Appears intact Memory: Appears intact Awareness: Appears intact Problem Solving: Appears intact Safety/Judgment: Appears intact Sensation Sensation Light Touch: Appears Intact Hot/Cold: Appears Intact Proprioception: Appears Intact Stereognosis: Appears Intact Coordination Gross Motor Movements are Fluid and Coordinated: No Fine Motor Movements are Fluid and Coordinated: Yes Motor  Motor Motor - Discharge Observations: generalized weakness, improved since eval Mobility  Bed Mobility Supine to Sit: Independent with assistive device Sit to Supine: Independent with assistive device Transfers Sit to Stand: Independent with assistive device Stand to Sit: Independent with assistive device  Balance Static Sitting Balance Static Sitting - Balance Support: Feet supported Static Sitting - Level  of Assistance: 7: Independent Dynamic Sitting Balance Dynamic Sitting - Balance Support: During functional activity Dynamic Sitting - Level of Assistance: 7: Independent Static Standing Balance Static Standing - Balance Support: During functional activity Static Standing - Level of Assistance: 6: Modified independent (Device/Increase time) Dynamic Standing Balance Dynamic Standing - Balance Support:  During functional activity Dynamic Standing - Level of Assistance: 5: Stand by assistance Extremity/Trunk Assessment RUE Assessment RUE Assessment: Within Functional Limits LUE Assessment LUE Assessment: Within Functional Limits General Strength Comments: L thumb, decreased [place], 4-/5 in shoulder flexion   [name] [name] 04/01/2020, 9:05 AM

## 2020-04-01 NOTE — Plan of Care (Signed)
  Problem: RH Balance Goal: LTG: Patient will maintain dynamic sitting balance (OT) Description: LTG:  Patient will maintain dynamic sitting balance with assistance during activities of daily living (OT) Outcome: Completed/Met Goal: LTG Patient will maintain dynamic standing with ADLs (OT) Description: LTG:  Patient will maintain dynamic standing balance with assist during activities of daily living (OT)  Outcome: Completed/Met   Problem: Sit to Stand Goal: LTG:  Patient will perform sit to stand in prep for activites of daily living with assistance level (OT) Description: LTG:  Patient will perform sit to stand in prep for activites of daily living with assistance level (OT) Outcome: Completed/Met   Problem: RH Eating Goal: LTG Patient will perform eating w/assist, cues/equip (OT) Description: LTG: Patient will perform eating with assist, with/without cues using equipment (OT) Outcome: Completed/Met   Problem: RH Grooming Goal: LTG Patient will perform grooming w/assist,cues/equip (OT) Description: LTG: Patient will perform grooming with assist, with/without cues using equipment (OT) Outcome: Completed/Met   Problem: RH Bathing Goal: LTG Patient will bathe all body parts with assist levels (OT) Description: LTG: Patient will bathe all body parts with assist levels (OT) Outcome: Completed/Met   Problem: RH Dressing Goal: LTG Patient will perform upper body dressing (OT) Description: LTG Patient will perform upper body dressing with assist, with/without cues (OT). Outcome: Completed/Met Goal: LTG Patient will perform lower body dressing w/assist (OT) Description: LTG: Patient will perform lower body dressing with assist, with/without cues in positioning using equipment (OT) Outcome: Completed/Met   Problem: RH Toileting Goal: LTG Patient will perform toileting task (3/3 steps) with assistance level (OT) Description: LTG: Patient will perform toileting task (3/3 steps) with  assistance level (OT)  Outcome: Completed/Met   Problem: RH Simple Meal Prep Goal: LTG Patient will perform simple meal prep w/assist (OT) Description: LTG: Patient will perform simple meal prep with assistance, with/without cues (OT). Outcome: Completed/Met   Problem: RH Laundry Goal: LTG Patient will perform laundry w/assist, cues (OT) Description: LTG: Patient will perform laundry with assistance, with/without cues (OT). Outcome: Completed/Met   Problem: RH Light Housekeeping Goal: LTG Patient will perform light housekeeping w/assist (OT) Description: LTG: Patient will perform light housekeeping with assistance, with/without cues (OT). Outcome: Completed/Met   Problem: RH Toilet Transfers Goal: LTG Patient will perform toilet transfers w/assist (OT) Description: LTG: Patient will perform toilet transfers with assist, with/without cues using equipment (OT) Outcome: Completed/Met   Problem: RH Tub/Shower Transfers Goal: LTG Patient will perform tub/shower transfers w/assist (OT) Description: LTG: Patient will perform tub/shower transfers with assist, with/without cues using equipment (OT) Outcome: Completed/Met

## 2020-04-01 NOTE — Progress Notes (Signed)
Physical Therapy Session Note  Patient Details  Name: [name] MRN: [id number] Date of Birth: November 01, 1940  Today's Date: 04/01/2020 PT Individual Time: 6834-1962 PT Individual Time Calculation (min): 34 min   Short Term Goals: Week 1:  PT Short Term Goal 1 (Week 1): Pt will perform bed mobility with CGA PT Short Term Goal 2 (Week 1): Pt will ambulate 129ft with min assist PT Short Term Goal 3 (Week 1): Pt wil propell WC 189ft with supervision asssit PT Short Term Goal 4 (Week 1): Pt will ascend 4 steps with mod assist and LRAD   Skilled Therapeutic Interventions/Progress Updates:    Patient supine in bed upon PT arrival. Patient alert and agreeable to PT session. Patient c/o mild pain in R hip indicating "C" pain with R hand around joint. Pt states she has already had her pain medication.   Therapeutic Activity: Bed Mobility: Patient performed supine <> sit with Mod I. No cues required.  Transfers: Patient performed STS throughout session to PFW using split UE positioning with supervision and at end of session is able to rist to stand without using UE to push from w/c also with supervision. Provided verbal cues for technique and increasing effort through BLE with final rise to stand. SPVT performed using PFW and complete throughout with supervision.  Gait Training:  Patient ambulated>400' x1 outdoors over uneven pavement with PFW and supervision with w/c follow for fatigue. Requires BUE support to complete down then back up ramp with steep gradient. Pt is also able to manage PFW over changes in surfaces changing from outdoor to indoor. Provided verbal cues for unweighting PFW to clear cracks in walkway and clear the change in floor surface with transition indoors.  Pt guided in ambulation using hallway handrail to her R side. Quality of gait does not change. VC provided for forward gaze, upright posture, increasing step length. At end of HR, pt transitioned to R HHA from this therapist  and is able to continue another 30 feet to complete total of 100 feet without use of walker and only have R handed UE support.   Patient supine in bed at end of session with brakes locked, bed alarm set, and all needs within reach either at her side or at tray table to her L side.   Therapy Documentation Precautions:  Precautions Precautions: Fall Restrictions Weight Bearing Restrictions: No RLE Weight Bearing: Weight bearing as tolerated LLE Weight Bearing: Weight bearing as tolerated   Therapy/Group: Individual Therapy  Alger Simons 04/01/2020, 4:09 PM

## 2020-04-01 NOTE — Progress Notes (Signed)
PROGRESS NOTE   Subjective/Complaints: Feels soreness throughout her whole body. Pain medication helps.  She is looking forward to discharge tomorrow.  Hgb 10.4, up from 10.3 Cr is 1.38- stable  ROS: Patient denies chest pain shortness of breath nausea vomiting diarrhea constipation   Objective:   No results found. Recent Labs    04/01/20 0439  WBC 8.1  HGB 10.4*  HCT 32.9*  PLT 383   Recent Labs    04/01/20 0439  NA 138  K 4.6  CL 106  CO2 23  GLUCOSE 98  BUN 16  CREATININE 1.38*  CALCIUM 9.5    Intake/Output Summary (Last 24 hours) at 04/01/2020 1314 Last data filed at 04/01/2020 1231 Gross per 24 hour  Intake 740 ml  Output -  Net 740 ml        Physical Exam: Vital Signs Blood pressure (!) 131/58, pulse 62, temperature 98 F (36.7 C), temperature source Oral, resp. rate 16, height 5' 2.5" (1.588 m), weight 80.5 kg, SpO2 95 %. Gen: no distress, normal appearing HEENT: oral mucosa pink and moist, NCAT Cardio: Reg rate Chest: normal effort, normal rate of breathing Abd: soft, non-distended Ext: no edema Psych: pleasant, normal affect Skin: intact Musculoskeletal:        LB tender with ROM.  Pelvic pain with AROM/PROM BLE    Comments: Left hand with edema and tenderness improving. Able to grasp Skin:    Comments: Laceration left 1/2 web---sutures in place. CDI   -a few scattered bruises  Neurological:     Comments: Alert and oriented x 3. Normal insight and awareness. Intact Memory. Normal language and speech. Cranial nerve exam unremarkable  RUE: 4/5 proximal distal LUE: 4/5 except for grip.  Bilateral lower extremities: 3-to 4/5 proximal distal (pain inhibition ongoing on R>L)     Assessment/Plan: 1. Functional deficits which require 3+ hours per day of interdisciplinary therapy in a comprehensive inpatient rehab setting.  Physiatrist is providing close team supervision and 24 hour  management of active medical problems listed below.  Physiatrist and rehab team continue to assess barriers to discharge/monitor patient progress toward functional and medical goals  Care Tool:  Bathing    Body parts bathed by patient: Right arm,Face,Left arm,Chest,Abdomen,Front perineal area,Buttocks,Right upper leg,Left upper leg,Right lower leg,Left lower leg         Bathing assist Assist Level: Supervision/Verbal cueing     Upper Body Dressing/Undressing Upper body dressing   What is the patient wearing?: Pull over shirt    Upper body assist Assist Level: Independent    Lower Body Dressing/Undressing Lower body dressing      What is the patient wearing?: Underwear/pull up,Pants     Lower body assist Assist for lower body dressing: Supervision/Verbal cueing     Toileting Toileting    Toileting assist Assist for toileting: Supervision/Verbal cueing     Transfers Chair/bed transfer  Transfers assist     Chair/bed transfer assist level: Supervision/Verbal cueing     Locomotion Ambulation   Ambulation assist      Assist level: Supervision/Verbal cueing Assistive device: Walker-platform Max distance: 400'   Walk 10 feet activity   Assist  Assist level: Supervision/Verbal cueing Assistive device: Walker-platform   Walk 50 feet activity   Assist Walk 50 feet with 2 turns activity did not occur: Safety/medical concerns  Assist level: Supervision/Verbal cueing Assistive device: Walker-platform    Walk 150 feet activity   Assist Walk 150 feet activity did not occur: Safety/medical concerns  Assist level: Supervision/Verbal cueing Assistive device: Walker-platform    Walk 10 feet on uneven surface  activity   Assist Walk 10 feet on uneven surfaces activity did not occur: Safety/medical concerns         Wheelchair     Assist   Type of Wheelchair: Manual    Wheelchair assist level: Minimal Assistance - Patient >  75% Max wheelchair distance: 150    Wheelchair 50 feet with 2 turns activity    Assist        Assist Level: Minimal Assistance - Patient > 75%   Wheelchair 150 feet activity     Assist      Assist Level: Minimal Assistance - Patient > 75%   Blood pressure (!) 131/58, pulse 62, temperature 98 F (36.7 C), temperature source Oral, resp. rate 16, height 5' 2.5" (1.588 m), weight 80.5 kg, SpO2 95 %.  Medical Problem List and Plan: 1.  Pain in RLE and left hand, balance deficits, decreased endurance secondary to polytrauma with TBI.             -patient may shower             -ELOS/Goals: 04/02/20, supervision/min A.             -Continue CIR therapies including PT, OT, and SLP  2.  Antithrombotics: -DVT/anticoagulation:  Mechanical: Sequential compression devices, below knee Bilateral lower extremities  Dopplers clear- con't SCDs for now as she's ambulating             -antiplatelet therapy: N/A 3. Chronic LBP/Pain Management: Used to be managed by [facility] every 3 months. Followed by Dr. Nelva Bush.             Continue hydrocodone prn, kpad no pain complaints on 3/6, has used twice on 3/7             3/4 discussed patterns and time course of pelvic pain after fx's such as hers. She'll have to find positions which work for her in bed/sitting. If pain is increasing or persistent with activity, consider f/u xrays. At this point not indicated 4. Mood: LCSW to follow for evaluation and support.              -antipsychotic agents: N/A 5. Neuropsych: This patient is partially capable of making decisions on her own behalf. 6. Skin/Wound Care: Routine pressure relief measure.  7. Fluids/Electrolytes/Nutrition: Monitor I/Os             Continue supplements to help with protein stores.      8. T2DM with hyperglycemia: Monitor BS ac/hs and use SSI for elevated BS.   3/7 CBGs 84-130: continue to monitor.    hgb A1C is 7.1             Continue amaryl 1 mg bid.              CBG (last 3)   Recent Labs    03/31/20 2056 04/01/20 0629 04/01/20 1158  GLUCAP 130* 97 84     9. Right acetabular wall Fx/Pelvic Fx: Weightbearing as tolerated 10. Left hand laceration: Continue to monitor for healing---remove sutures soon 11. Hypokalemia: Was  on Kdur tid at home-->has been resumed with improvement.   3/2 K+ 4.1- con't daily Kdur 12. Leucocytosis:      resolved     13. ABLA: Hgb reviewed and up to 10.4 on 3/7, monitor outpatient  -stool OB -, no signs of gross blood loss 14. HTN: Monitor BP tid--continue Enalapril, HCTZ (held) and meotprolol.              3/1 bp control better today. Continue above regimen Vitals:   03/31/20 1949 04/01/20 0356  BP: 127/60 (!) 131/58  Pulse: 68 62  Resp: 20 16  Temp: 98.3 F (36.8 C) 98 F (36.7 C)  SpO2: 96% 95%  BP controlled 3/6 15. H/o Depression: Managed on Lexapro.  16. Hypoxia: resolved.  17. Prerenal azotemia:  -BUN 19/1.45 --> 17/1.38 3/2 ==continue to hold HCTZ  -stable at 1.38 on 3/7, repeat outpatient  - encourage po intake LOS: 10 days A FACE TO FACE EVALUATION WAS PERFORMED  Krutika P Raulkar 04/01/2020, 1:14 PM
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
## 2020-04-01 NOTE — Progress Notes (Signed)
Physical Therapy Discharge Summary  Patient Details  Name: [name] MRN: [id number] Date of Birth: Jun 06, 1940  Today's Date: 04/01/2020 PT Individual Time: 1105-1200 and 8469-6295 PT Individual Time Calculation (min): 55 min and 41 min   Patient has met 9 of 9 long term goals due to improved activity tolerance, improved balance, increased strength and decreased pain.  Patient to discharge at an ambulatory level Supervision.   Patient's care partner is independent to provide the necessary physical assistance at discharge.  Reasons goals not met: NA  Recommendation:  Patient will benefit from ongoing skilled PT services in outpatient setting to continue to advance safe functional mobility, address ongoing impairments in strength, balance, ambulation, and minimize fall risk.  Equipment: L platform for RW  Reasons for discharge: treatment goals met and discharge from hospital  Patient/family agrees with progress made and goals achieved: Yes   Skilled Therapeutic Interventions: 1st Session: Pt received supine in bed and agrees to therapy. Bed mobility independent with bed features. Sit to stand and stand pivot transfer to [facility] independent with increased time required. PT adheres new platform to pt's walker to size for home use. WC transport to gym for time management. Pt performs car transfer with set-up assist. PT provides educaton on importance of not grabbing car door during transfer. Pt then performs supine therex, including 1x10 ankle pumps, quad and glute sets, heel slides, hip abduction, and L lower extremity SLRs. Pt attempts R lower extremity SLRs but says she is in too much pain to complete at this time. PT provides rest breaks to manage R hip pain. Pt ambulates x300' back to room with platform walker and mod(I). Pt left supine in bed with alarm intact and all needs within reach.  2nd Session: Pt received supine in bed and agrees to therapy. Reports pain in R hip but had received  pain meds shortly prior to therapy. PT provides est breaks to manage pain. Bed mobility mod(I). Sit to stand and stand pivot to WC mod(I). Pt completes ramp navigation with platform walker and no assistance required. Pt then performs x12 steps with bilateral hand rails and supervision for safety. Pt performs multiple bouts of 100' of gait with emphasis on increasing gait speed. Initially pt ambulates at 2.15 feet per/second but with cues and increased trials pt increases gait speed to 2.33 feet/second. Pt performs x10 reps of sit to stand with arms folded across chest for increased strengthening and balance challenge. Pt left supine with alarm intact and all needs within reach.  PT Discharge Precautions/Restrictions Precautions Precautions: Fall Restrictions Weight Bearing Restrictions: No RLE Weight Bearing: Weight bearing as tolerated LLE Weight Bearing: Weight bearing as tolerated Pain Pain Assessment Pain Scale: 0-10 Pain Score: 5  Pain Type: Acute pain Pain Location: Groin Pain Orientation: Left Pain Descriptors / Indicators: Aching Pain Frequency: Intermittent Pain Onset: On-going Pain Intervention(s): Medication (See eMAR) Vision/Perception  Perception Perception: Within Functional Limits Praxis Praxis: Intact  Cognition Overall Cognitive Status: Within Functional Limits for tasks assessed Arousal/Alertness: Awake/alert Orientation Level: Oriented X4 Alternating Attention: Appears intact Divided Attention: Appears intact Memory: Appears intact Awareness: Appears intact Problem Solving: Appears intact Safety/Judgment: Appears intact Sensation Sensation Light Touch: Appears Intact Hot/Cold: Appears Intact Proprioception: Appears Intact Stereognosis: Appears Intact Coordination Gross Motor Movements are Fluid and Coordinated: No Fine Motor Movements are Fluid and Coordinated: Yes Motor  Motor Motor - Discharge Observations: generalized weakness, improved since eval   Mobility Bed Mobility Rolling Right: Independent Rolling Left: Independent Supine to Sit:  Independent with assistive device Sit to Supine: Independent with assistive device Transfers Transfers: Sit to Stand;Stand to Sit;Stand Pivot Transfers Sit to Stand: Independent Stand to Sit: Independent Stand Pivot Transfers: Independent Locomotion  Gait Ambulation: Yes Gait Assistance: Independent with assistive device Gait Distance (Feet): 300 Feet Assistive device: Left platform walker Gait Gait: Yes Gait Pattern: Within Functional Limits Gait velocity: reduced Stairs / Additional Locomotion Stairs: Yes Stairs Assistance: Supervision/Verbal cueing Stair Management Technique: Two rails Number of Stairs: 12 Height of Stairs: 6 Ramp: Independent with assistive device Curb: Engineer, maintenance (IT) Mobility: Yes Wheelchair Assistance: Chartered loss adjuster: Both upper extremities;Right upper extremity Distance: 150  Trunk/Postural Assessment  Cervical Assessment Cervical Assessment:  (forward head) Thoracic Assessment Thoracic Assessment:  (rounded shoulders) Lumbar Assessment Lumbar Assessment:  (posterior pelvic tilt) Postural Control Postural Control: Within Functional Limits  Balance Static Sitting Balance Static Sitting - Balance Support: Feet supported Static Sitting - Level of Assistance: 7: Independent Dynamic Sitting Balance Dynamic Sitting - Balance Support: During functional activity Dynamic Sitting - Level of Assistance: 7: Independent Static Standing Balance Static Standing - Balance Support: During functional activity Static Standing - Level of Assistance: 6: Modified independent (Device/Increase time) Dynamic Standing Balance Dynamic Standing - Balance Support: During functional activity Dynamic Standing - Level of Assistance: 5: Stand by assistance Extremity Assessment  RUE Assessment RUE  Assessment: Within Functional Limits LUE Assessment LUE Assessment: Within Functional Limits General Strength Comments: L thumb, decreased [place], 4-/5 in shoulder flexion RLE Assessment RLE Assessment: Exceptions to [facility] General Strength Comments: grossly 4+/5, hip flexion 4/5 LLE Assessment LLE Assessment: Within Functional Limits    Breck Coons, PT, DPT 04/01/2020, 4:03 PM

## 2020-04-02 LAB — GLUCOSE, CAPILLARY: Glucose-Capillary: 82 mg/dL (ref 70–99)

## 2020-04-02 MED ORDER — ACETAMINOPHEN 325 MG PO TABS: 325.0000 mg | ORAL_TABLET | ORAL | Status: AC | PRN

## 2020-04-02 MED ORDER — TRAZODONE HCL 50 MG PO TABS
25.0000 mg | ORAL_TABLET | Freq: Every evening | ORAL | 0 refills | Status: AC | PRN
Start: 2020-04-02 — End: ?

## 2020-04-02 MED ORDER — HYDROCODONE-ACETAMINOPHEN 5-325 MG PO TABS: 1.0000 | ORAL_TABLET | Freq: Four times a day (QID) | ORAL | 0 refills | Status: DC | PRN

## 2020-04-02 NOTE — Discharge Instructions (Signed)
  Inpatient Rehab Discharge Instructions  [name] Discharge date and time: 04/02/20   Activities/Precautions/ Functional Status: Activity: no lifting, driving, or strenuous exercise till clearef by MD Diet: regular diet Wound Care: keep wound clean and dry   Functional status:  ___ No restrictions     ___ Walk up steps independently _X__ 24/7 supervision/assistance   ___ Walk up steps with assistance ___ Intermittent supervision/assistance  ___ Bathe/dress independently ___ Walk with walker     ___ Bathe/dress with assistance ___ Walk Independently    ___ Shower independently ___ Walk with assistance    _X__ Shower with assistance _X__ No alcohol     ___ Return to work/school ________    Special Instructions:  COMMUNITY REFERRALS UPON DISCHARGE:    Outpatient: PT     OT             Agency: [facility]  KJZPH:[phone]              Appointment Date/Time:*Please expect follow-up within 7-10 business days to schedule appointment. If you do not receive follow-up, be sure to contact site directly.*  Medical Equipment/Items Ordered: left platform attachment for rolling walker                                                 Agency/Supplier: [place] [phone]    My questions have been answered and I understand these instructions. I will adhere to these goals and the provided educational materials after my discharge from the hospital.  Patient/Caregiver Signature _______________________________ Date __________  Clinician Signature _______________________________________ Date __________  Please bring this form and your medication list with you to all your follow-up doctor's appointments.

## 2020-04-02 NOTE — Progress Notes (Signed)
PROGRESS NOTE   Subjective/Complaints: Patient's chart reviewed- No issues reported overnight Vitals signs stable Discussed that we have given her a 30 day supply of medications- except for pain medications which will be for 1 week.   ROS: Patient denies chest pain shortness of breath nausea vomiting diarrhea constipation   Objective:   No results found. Recent Labs    04/01/20 0439  WBC 8.1  HGB 10.4*  HCT 32.9*  PLT 383   Recent Labs    04/01/20 0439  NA 138  K 4.6  CL 106  CO2 23  GLUCOSE 98  BUN 16  CREATININE 1.38*  CALCIUM 9.5    Intake/Output Summary (Last 24 hours) at 04/02/2020 0839 Last data filed at 04/02/2020 0756 Gross per 24 hour  Intake 720 ml  Output --  Net 720 ml        Physical Exam: Vital Signs Blood pressure 135/62, pulse 70, temperature 98.5 F (36.9 C), resp. rate 16, height 5' 2.5" (1.588 m), weight 80.5 kg, SpO2 94 %. Gen: no distress, normal appearing HEENT: oral mucosa pink and moist, NCAT Cardio: Reg rate Chest: normal effort, normal rate of breathing Abd: soft, non-distended Ext: no edema Psych: pleasant, normal affect Musculoskeletal:        LB tender with ROM.  Pelvic pain with AROM/PROM BLE    Comments: Left hand with edema and tenderness improving. Able to grasp Skin:    Comments: Laceration left 1/2 web---sutures in place. CDI   -a few scattered bruises  Neurological:     Comments: Alert and oriented x 3. Normal insight and awareness. Intact Memory. Normal language and speech. Cranial nerve exam unremarkable  RUE: 4/5 proximal distal LUE: 4/5 except for grip.  Bilateral lower extremities: 3-to 4/5 proximal distal (pain inhibition ongoing on R>L)     Assessment/Plan: 1. Functional deficits which require 3+ hours per day of interdisciplinary therapy in a comprehensive inpatient rehab setting.  Physiatrist is providing close team supervision and 24 hour  management of active medical problems listed below.  Physiatrist and rehab team continue to assess barriers to discharge/monitor patient progress toward functional and medical goals  Care Tool:  Bathing    Body parts bathed by patient: Right arm,Face,Left arm,Chest,Abdomen,Front perineal area,Buttocks,Right upper leg,Left upper leg,Right lower leg,Left lower leg         Bathing assist Assist Level: Supervision/Verbal cueing     Upper Body Dressing/Undressing Upper body dressing   What is the patient wearing?: Pull over shirt    Upper body assist Assist Level: Independent    Lower Body Dressing/Undressing Lower body dressing      What is the patient wearing?: Underwear/pull up,Pants     Lower body assist Assist for lower body dressing: Supervision/Verbal cueing     Toileting Toileting    Toileting assist Assist for toileting: Supervision/Verbal cueing     Transfers Chair/bed transfer  Transfers assist     Chair/bed transfer assist level: Independent     Locomotion Ambulation   Ambulation assist      Assist level: Independent with assistive device Assistive device: Walker-platform Max distance: 300'   Walk 10 feet activity   Assist  Assist level: Independent with assistive device Assistive device: Walker-platform   Walk 50 feet activity   Assist Walk 50 feet with 2 turns activity did not occur: Safety/medical concerns  Assist level: Independent with assistive device Assistive device: Walker-platform    Walk 150 feet activity   Assist Walk 150 feet activity did not occur: Safety/medical concerns  Assist level: Independent with assistive device Assistive device: Walker-platform    Walk 10 feet on uneven surface  activity   Assist Walk 10 feet on uneven surfaces activity did not occur: Safety/medical concerns   Assist level: Independent with assistive device Assistive device: Walker-platform   Wheelchair     Assist  Will patient use wheelchair at discharge?: No Type of Wheelchair: Manual    Wheelchair assist level: Minimal Assistance - Patient > 75% Max wheelchair distance: 150    Wheelchair 50 feet with 2 turns activity    Assist        Assist Level: Minimal Assistance - Patient > 75%   Wheelchair 150 feet activity     Assist      Assist Level: Minimal Assistance - Patient > 75%   Blood pressure 135/62, pulse 70, temperature 98.5 F (36.9 C), resp. rate 16, height 5' 2.5" (1.588 m), weight 80.5 kg, SpO2 94 %.  Medical Problem List and Plan: 1.  Pain in RLE and left hand, balance deficits, decreased endurance secondary to polytrauma with TBI.             -patient may shower             -ELOS/Goals: 04/02/20, supervision/min A.             -DC home today  2.  Antithrombotics: -DVT/anticoagulation:  Mechanical: Sequential compression devices, below knee Bilateral lower extremities  Dopplers clear- con't SCDs for now as she's ambulating             -antiplatelet therapy: N/A 3. Chronic LBP/Pain Management: Used to be managed by ESI every 3 months. Followed by Dr. Nelva Bush.             Continue hydrocodone prn, kpad no pain complaints on 3/6, has used twice on 3/7  Discussed prognosis of injury, f/u with orthopedics and/or PCP soon for refill of pain medication as needed.              3/4 discussed patterns and time course of pelvic pain after fx's such as hers. She'll have to find positions which work for her in bed/sitting. If pain is increasing or persistent with activity, consider f/u xrays. At this point not indicated 4. Mood: LCSW to follow for evaluation and support.              -antipsychotic agents: N/A 5. Neuropsych: This patient is partially capable of making decisions on her own behalf. 6. Skin/Wound Care: Routine pressure relief measure.  7. Fluids/Electrolytes/Nutrition: Monitor I/Os             Continue supplements to help with protein stores.      8. T2DM with  hyperglycemia: Monitor BS ac/hs and use SSI for elevated BS.   3/7 CBGs 84-130: continue to monitor.    hgb A1C is 7.1            Continue amaryl 1 mg bid.              CBG (last 3)  Recent Labs    04/01/20 1642 04/01/20 2118 04/02/20 0620  GLUCAP 78 116* 82  9. Right acetabular wall Fx/Pelvic Fx: Weightbearing as tolerated 10. Left hand laceration: Continue to monitor for healing---remove sutures soon 11. Hypokalemia: Was on Kdur tid at home-->has been resumed with improvement.   3/2 K+ 4.1- con't daily Kdur 12. Leucocytosis:      resolved     13. ABLA: Hgb reviewed and up to 10.4 on 3/7, monitor outpatient  -stool OB -, no signs of gross blood loss 14. HTN: Monitor BP tid--continue Enalapril, HCTZ (held) and meotprolol.              3/1 bp control better today. Continue above regimen Vitals:   04/01/20 1944 04/02/20 0402  BP: 132/66 135/62  Pulse: 75 70  Resp: 20 16  Temp: 98.6 F (37 C) 98.5 F (36.9 C)  SpO2: 98% 94%  BP controlled 3/8- continue to monitor daily outpatient 15. H/o Depression: Managed on Lexapro.  16. Hypoxia: resolved.  17. Prerenal azotemia:  -BUN 19/1.45 --> 17/1.38 3/2 ==continue to hold HCTZ  -stable at 1.38 on 3/7, repeat outpatient  - encourage po intake   >30 minutes spent in discharge of patient including review of medications and follow-up appointments, physical examination, and in answering all patient's questions  LOS: 11 days A FACE TO FACE EVALUATION WAS [place] 04/02/2020, 8:39 AM
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
## 2020-04-04 ENCOUNTER — Telehealth: Payer: Self-pay | Admitting: *Deleted

## 2020-04-04 NOTE — Telephone Encounter (Signed)
Transition Care Management Unsuccessful Follow-up Telephone Call  Date of discharge and from where:Inpatient Rehab 04/02/2020  Attempts:  1st Attempt  Reason for unsuccessful TCM follow-up call:  No answer/busy

## 2020-04-05 NOTE — Telephone Encounter (Signed)
Transitional Care call--I spoke with Mrs Vallin    1. Are you/is patient experiencing any problems since coming home? Are there any questions regarding any aspect of care? NO 2. Are there any questions regarding medications administration/dosing? Are meds being taken as prescribed? Patient should review meds with caller to confirm Yes she has all medications. Asked to bring hydrocodone bottle to appt. 3. Have there been any falls? NO 4. Has Home Health been to the house and/or have they contacted you? If not, have you tried to contact them? Can we help you contact them? [facility], and they made visit yesterday, she will be outpt PT/OT with [facility] 5. Are bowels and bladder emptying properly? Are there any unexpected incontinence issues? If applicable, is patient following bowel/bladder programs? NO problems 6. Any fevers, problems with breathing, unexpected pain? NO 7. Are there any skin problems or new areas of breakdown? NO 8. Has the patient/family member arranged specialty MD follow up (ie cardiology/neurology/renal/surgical/etc)?  Can we help arrange? Has appt with ortho Dr Amedeo Plenty 04/24/20, Dr Dema Severin PCP 04/11/20 and given apt to see Danella Sensing NP 04/10/20  9. Does the patient need any other services or support that we can help arrange? NO 10. Are caregivers following through as expected in assisting the patient? YES 11. Has the patient quit smoking, drinking alcohol, or using drugs as recommended? YES  Appointment Wednesday 04/10/20 @11 :20 arrive by 11:00 to see Danella Sensing NP, Raulkar thereafter Notified to watch mail for packet from office , address reviewed [address]

## 2020-04-08 DIAGNOSIS — S069X9A Unspecified intracranial injury with loss of consciousness of unspecified duration, initial encounter: Secondary | ICD-10-CM | POA: Diagnosis not present

## 2020-04-09 ENCOUNTER — Telehealth: Payer: Self-pay

## 2020-04-09 NOTE — Telephone Encounter (Signed)
[name] from [facility] [place] called stating they need more information on how to treat patient for PT and OT. Patient has not had an appt in outpatient office yet.

## 2020-04-10 ENCOUNTER — Encounter: Payer: Self-pay | Admitting: Registered Nurse

## 2020-04-10 ENCOUNTER — Encounter: Payer: PPO | Attending: Registered Nurse | Admitting: Registered Nurse

## 2020-04-10 ENCOUNTER — Other Ambulatory Visit: Payer: Self-pay

## 2020-04-10 VITALS — BP 156/84 | HR 69 | Temp 98.7°F | Ht 62.5 in | Wt 150.0 lb

## 2020-04-10 DIAGNOSIS — S32049D Unspecified fracture of fourth lumbar vertebra, subsequent encounter for fracture with routine healing: Secondary | ICD-10-CM | POA: Insufficient documentation

## 2020-04-10 DIAGNOSIS — I10 Essential (primary) hypertension: Secondary | ICD-10-CM | POA: Insufficient documentation

## 2020-04-10 DIAGNOSIS — S32401D Unspecified fracture of right acetabulum, subsequent encounter for fracture with routine healing: Secondary | ICD-10-CM | POA: Insufficient documentation

## 2020-04-10 DIAGNOSIS — S3210XD Unspecified fracture of sacrum, subsequent encounter for fracture with routine healing: Secondary | ICD-10-CM | POA: Insufficient documentation

## 2020-04-10 DIAGNOSIS — S32019D Unspecified fracture of first lumbar vertebra, subsequent encounter for fracture with routine healing: Secondary | ICD-10-CM | POA: Diagnosis not present

## 2020-04-10 DIAGNOSIS — S32029D Unspecified fracture of second lumbar vertebra, subsequent encounter for fracture with routine healing: Secondary | ICD-10-CM | POA: Insufficient documentation

## 2020-04-10 DIAGNOSIS — S065X9A Traumatic subdural hemorrhage with loss of consciousness of unspecified duration, initial encounter: Secondary | ICD-10-CM | POA: Diagnosis not present

## 2020-04-10 DIAGNOSIS — S065X9D Traumatic subdural hemorrhage with loss of consciousness of unspecified duration, subsequent encounter: Secondary | ICD-10-CM | POA: Insufficient documentation

## 2020-04-10 DIAGNOSIS — E1165 Type 2 diabetes mellitus with hyperglycemia: Secondary | ICD-10-CM | POA: Insufficient documentation

## 2020-04-10 DIAGNOSIS — S065XAA Traumatic subdural hemorrhage with loss of consciousness status unknown, initial encounter: Secondary | ICD-10-CM

## 2020-04-10 DIAGNOSIS — T07XXXA Unspecified multiple injuries, initial encounter: Secondary | ICD-10-CM

## 2020-04-10 NOTE — Patient Instructions (Signed)
Call Dr. Annette Stable [facility] [place] [address]  [phone]   Call Dr Erlinda Hong Orth[facility] [address]  [phone]
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
## 2020-04-11 DIAGNOSIS — F331 Major depressive disorder, recurrent, moderate: Secondary | ICD-10-CM | POA: Diagnosis not present

## 2020-04-11 DIAGNOSIS — Z87828 Personal history of other (healed) physical injury and trauma: Secondary | ICD-10-CM | POA: Diagnosis not present

## 2020-04-11 DIAGNOSIS — E1169 Type 2 diabetes mellitus with other specified complication: Secondary | ICD-10-CM | POA: Diagnosis not present

## 2020-04-11 DIAGNOSIS — S069X0D Unspecified intracranial injury without loss of consciousness, subsequent encounter: Secondary | ICD-10-CM | POA: Diagnosis not present

## 2020-04-11 DIAGNOSIS — N183 Chronic kidney disease, stage 3 unspecified: Secondary | ICD-10-CM | POA: Diagnosis not present

## 2020-04-11 DIAGNOSIS — E876 Hypokalemia: Secondary | ICD-10-CM | POA: Diagnosis not present

## 2020-04-11 DIAGNOSIS — S32009D Unspecified fracture of unspecified lumbar vertebra, subsequent encounter for fracture with routine healing: Secondary | ICD-10-CM | POA: Diagnosis not present

## 2020-04-11 DIAGNOSIS — I129 Hypertensive chronic kidney disease with stage 1 through stage 4 chronic kidney disease, or unspecified chronic kidney disease: Secondary | ICD-10-CM | POA: Diagnosis not present

## 2020-04-17 DIAGNOSIS — S069X9A Unspecified intracranial injury with loss of consciousness of unspecified duration, initial encounter: Secondary | ICD-10-CM | POA: Diagnosis not present

## 2020-04-18 ENCOUNTER — Ambulatory Visit: Payer: PPO | Admitting: Orthopaedic Surgery

## 2020-04-18 ENCOUNTER — Ambulatory Visit (INDEPENDENT_AMBULATORY_CARE_PROVIDER_SITE_OTHER): Payer: PPO

## 2020-04-18 DIAGNOSIS — M25551 Pain in right hip: Secondary | ICD-10-CM | POA: Diagnosis not present
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
## 2020-04-24 DIAGNOSIS — T1490XA Injury, unspecified, initial encounter: Secondary | ICD-10-CM | POA: Diagnosis not present

## 2020-04-24 DIAGNOSIS — M13842 Other specified arthritis, left hand: Secondary | ICD-10-CM | POA: Diagnosis not present

## 2020-04-24 DIAGNOSIS — S61012A Laceration without foreign body of left thumb without damage to nail, initial encounter: Secondary | ICD-10-CM | POA: Diagnosis not present

## 2020-04-24 DIAGNOSIS — S069X9A Unspecified intracranial injury with loss of consciousness of unspecified duration, initial encounter: Secondary | ICD-10-CM | POA: Diagnosis not present

## 2020-04-29 DIAGNOSIS — M7541 Impingement syndrome of right shoulder: Secondary | ICD-10-CM | POA: Diagnosis not present

## 2020-04-30 DIAGNOSIS — I129 Hypertensive chronic kidney disease with stage 1 through stage 4 chronic kidney disease, or unspecified chronic kidney disease: Secondary | ICD-10-CM | POA: Diagnosis not present

## 2020-04-30 DIAGNOSIS — C50412 Malignant neoplasm of upper-outer quadrant of left female breast: Secondary | ICD-10-CM | POA: Diagnosis not present

## 2020-04-30 DIAGNOSIS — R0681 Apnea, not elsewhere classified: Secondary | ICD-10-CM | POA: Diagnosis not present

## 2020-04-30 DIAGNOSIS — E785 Hyperlipidemia, unspecified: Secondary | ICD-10-CM | POA: Diagnosis not present

## 2020-04-30 DIAGNOSIS — E1169 Type 2 diabetes mellitus with other specified complication: Secondary | ICD-10-CM | POA: Diagnosis not present

## 2020-04-30 DIAGNOSIS — N183 Chronic kidney disease, stage 3 unspecified: Secondary | ICD-10-CM | POA: Diagnosis not present

## 2020-04-30 DIAGNOSIS — E559 Vitamin D deficiency, unspecified: Secondary | ICD-10-CM | POA: Diagnosis not present

## 2020-04-30 DIAGNOSIS — Z0000 Encounter for general adult medical examination without abnormal findings: Secondary | ICD-10-CM | POA: Diagnosis not present

## 2020-04-30 DIAGNOSIS — E039 Hypothyroidism, unspecified: Secondary | ICD-10-CM | POA: Diagnosis not present

## 2020-04-30 DIAGNOSIS — Z1159 Encounter for screening for other viral diseases: Secondary | ICD-10-CM | POA: Diagnosis not present

## 2020-04-30 DIAGNOSIS — F331 Major depressive disorder, recurrent, moderate: Secondary | ICD-10-CM | POA: Diagnosis not present

## 2020-05-01 DIAGNOSIS — S069X9A Unspecified intracranial injury with loss of consciousness of unspecified duration, initial encounter: Secondary | ICD-10-CM | POA: Diagnosis not present

## 2020-05-08 DIAGNOSIS — S069X9A Unspecified intracranial injury with loss of consciousness of unspecified duration, initial encounter: Secondary | ICD-10-CM | POA: Diagnosis not present

## 2020-05-15 DIAGNOSIS — S069X9A Unspecified intracranial injury with loss of consciousness of unspecified duration, initial encounter: Secondary | ICD-10-CM | POA: Diagnosis not present

## 2020-05-29 ENCOUNTER — Encounter: Payer: Self-pay | Admitting: Physical Medicine and Rehabilitation

## 2020-05-29 ENCOUNTER — Encounter: Payer: PPO | Attending: Registered Nurse | Admitting: Physical Medicine and Rehabilitation

## 2020-05-29 ENCOUNTER — Other Ambulatory Visit: Payer: Self-pay

## 2020-05-29 VITALS — BP 145/76 | HR 80 | Temp 99.0°F | Ht 62.5 in | Wt 165.0 lb

## 2020-05-29 DIAGNOSIS — S32028A Other fracture of second lumbar vertebra, initial encounter for closed fracture: Secondary | ICD-10-CM | POA: Diagnosis not present

## 2020-05-29 DIAGNOSIS — E1165 Type 2 diabetes mellitus with hyperglycemia: Secondary | ICD-10-CM | POA: Insufficient documentation

## 2020-05-29 DIAGNOSIS — S065X9A Traumatic subdural hemorrhage with loss of consciousness of unspecified duration, initial encounter: Secondary | ICD-10-CM | POA: Insufficient documentation

## 2020-05-29 DIAGNOSIS — S065XAA Traumatic subdural hemorrhage with loss of consciousness status unknown, initial encounter: Secondary | ICD-10-CM

## 2020-05-29 NOTE — Patient Instructions (Addendum)
Diabetes: 1) cinnamon- imitates effects of insulin, increasing glucose transport into cells ([place] or [place] cinnamon is best, least processed) 2) nuts- can slow down the blood sugar response of carbohydrate rich foods 3) oatmeal- contains and anti-inflammatory compound avenanthramide 4) whole-milk yogurt (best types are no sugar, [place] yogurt, or goat/sheep yogurt) 5) beans- high in protein, fiber, and vitamins, low glycemic index 6) broccoli- great source of vitamin A and C 7) quinoa- higher in protein and fiber than other grains 8) spinach- high in vitamin A, fiber, and protein 9) olive oil- reduces glucose levels, LDL, and triglycerides 10) salmon- excellent amount of omega-3-fatty acids 11) walnuts- rich in antioxidants 12) apples- high in fiber and quercetin 13) carrots- highly nutritious with low impact on blood sugar 14) eggs- improve HDL (good cholesterol), high in protein, keep you satiated 15) turmeric: improves blood sugars, cardiovascular disease, and protects kidney health 16) garlic: improves blood sugar, blood pressure, pain 17) tomatoes: highly nutritious with low impact on blood sugar
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
## 2020-05-30 ENCOUNTER — Ambulatory Visit: Payer: PPO | Admitting: Orthopaedic Surgery

## 2020-05-30 DIAGNOSIS — G4733 Obstructive sleep apnea (adult) (pediatric): Secondary | ICD-10-CM | POA: Diagnosis not present

## 2020-06-05 DIAGNOSIS — G4733 Obstructive sleep apnea (adult) (pediatric): Secondary | ICD-10-CM | POA: Diagnosis not present

## 2020-06-10 DIAGNOSIS — N183 Chronic kidney disease, stage 3 unspecified: Secondary | ICD-10-CM | POA: Diagnosis not present

## 2020-06-11 DIAGNOSIS — L814 Other melanin hyperpigmentation: Secondary | ICD-10-CM | POA: Diagnosis not present

## 2020-06-11 DIAGNOSIS — D239 Other benign neoplasm of skin, unspecified: Secondary | ICD-10-CM | POA: Diagnosis not present

## 2020-06-11 DIAGNOSIS — Z85828 Personal history of other malignant neoplasm of skin: Secondary | ICD-10-CM | POA: Diagnosis not present

## 2020-06-11 DIAGNOSIS — D1801 Hemangioma of skin and subcutaneous tissue: Secondary | ICD-10-CM | POA: Diagnosis not present

## 2020-06-11 DIAGNOSIS — L821 Other seborrheic keratosis: Secondary | ICD-10-CM | POA: Diagnosis not present

## 2020-06-11 DIAGNOSIS — X32XXXS Exposure to sunlight, sequela: Secondary | ICD-10-CM | POA: Diagnosis not present

## 2020-06-21 DIAGNOSIS — C50919 Malignant neoplasm of unspecified site of unspecified female breast: Secondary | ICD-10-CM | POA: Diagnosis not present

## 2020-06-21 DIAGNOSIS — N2581 Secondary hyperparathyroidism of renal origin: Secondary | ICD-10-CM | POA: Diagnosis not present

## 2020-06-21 DIAGNOSIS — F32A Depression, unspecified: Secondary | ICD-10-CM | POA: Diagnosis not present

## 2020-06-21 DIAGNOSIS — Z170 Estrogen receptor positive status [ER+]: Secondary | ICD-10-CM | POA: Diagnosis not present

## 2020-06-21 DIAGNOSIS — F419 Anxiety disorder, unspecified: Secondary | ICD-10-CM | POA: Diagnosis not present

## 2020-06-21 DIAGNOSIS — E785 Hyperlipidemia, unspecified: Secondary | ICD-10-CM | POA: Diagnosis not present

## 2020-06-21 DIAGNOSIS — N183 Chronic kidney disease, stage 3 unspecified: Secondary | ICD-10-CM | POA: Diagnosis not present

## 2020-06-21 DIAGNOSIS — I129 Hypertensive chronic kidney disease with stage 1 through stage 4 chronic kidney disease, or unspecified chronic kidney disease: Secondary | ICD-10-CM | POA: Diagnosis not present

## 2020-06-21 DIAGNOSIS — M170 Bilateral primary osteoarthritis of knee: Secondary | ICD-10-CM | POA: Diagnosis not present

## 2020-06-21 DIAGNOSIS — D631 Anemia in chronic kidney disease: Secondary | ICD-10-CM | POA: Diagnosis not present

## 2020-06-25 DIAGNOSIS — F331 Major depressive disorder, recurrent, moderate: Secondary | ICD-10-CM | POA: Diagnosis not present

## 2020-06-25 DIAGNOSIS — K219 Gastro-esophageal reflux disease without esophagitis: Secondary | ICD-10-CM | POA: Diagnosis not present

## 2020-06-25 DIAGNOSIS — E1169 Type 2 diabetes mellitus with other specified complication: Secondary | ICD-10-CM | POA: Diagnosis not present

## 2020-06-25 DIAGNOSIS — E039 Hypothyroidism, unspecified: Secondary | ICD-10-CM | POA: Diagnosis not present

## 2020-06-25 DIAGNOSIS — I129 Hypertensive chronic kidney disease with stage 1 through stage 4 chronic kidney disease, or unspecified chronic kidney disease: Secondary | ICD-10-CM | POA: Diagnosis not present

## 2020-06-25 DIAGNOSIS — E785 Hyperlipidemia, unspecified: Secondary | ICD-10-CM | POA: Diagnosis not present

## 2020-06-25 DIAGNOSIS — N183 Chronic kidney disease, stage 3 unspecified: Secondary | ICD-10-CM | POA: Diagnosis not present

## 2020-07-15 DIAGNOSIS — N183 Chronic kidney disease, stage 3 unspecified: Secondary | ICD-10-CM | POA: Diagnosis not present

## 2020-07-17 ENCOUNTER — Other Ambulatory Visit: Payer: Self-pay | Admitting: *Deleted

## 2020-07-17 DIAGNOSIS — E039 Hypothyroidism, unspecified: Secondary | ICD-10-CM | POA: Diagnosis not present

## 2020-07-17 DIAGNOSIS — E785 Hyperlipidemia, unspecified: Secondary | ICD-10-CM | POA: Diagnosis not present

## 2020-07-17 DIAGNOSIS — I129 Hypertensive chronic kidney disease with stage 1 through stage 4 chronic kidney disease, or unspecified chronic kidney disease: Secondary | ICD-10-CM | POA: Diagnosis not present

## 2020-07-17 DIAGNOSIS — E1169 Type 2 diabetes mellitus with other specified complication: Secondary | ICD-10-CM | POA: Diagnosis not present

## 2020-07-17 DIAGNOSIS — C50412 Malignant neoplasm of upper-outer quadrant of left female breast: Secondary | ICD-10-CM | POA: Diagnosis not present

## 2020-07-17 DIAGNOSIS — N183 Chronic kidney disease, stage 3 unspecified: Secondary | ICD-10-CM | POA: Diagnosis not present

## 2020-07-17 DIAGNOSIS — F331 Major depressive disorder, recurrent, moderate: Secondary | ICD-10-CM | POA: Diagnosis not present

## 2020-07-17 DIAGNOSIS — K219 Gastro-esophageal reflux disease without esophagitis: Secondary | ICD-10-CM | POA: Diagnosis not present

## 2020-07-17 DIAGNOSIS — M1990 Unspecified osteoarthritis, unspecified site: Secondary | ICD-10-CM | POA: Diagnosis not present

## 2020-07-17 MED ORDER — ANASTROZOLE 1 MG PO TABS: 1.0000 mg | ORAL_TABLET | Freq: Every day | ORAL | 3 refills | Status: DC

## 2020-07-23 DIAGNOSIS — M545 Low back pain, unspecified: Secondary | ICD-10-CM | POA: Diagnosis not present

## 2020-08-02 DIAGNOSIS — N183 Chronic kidney disease, stage 3 unspecified: Secondary | ICD-10-CM | POA: Diagnosis not present

## 2020-08-02 DIAGNOSIS — F331 Major depressive disorder, recurrent, moderate: Secondary | ICD-10-CM | POA: Diagnosis not present

## 2020-08-02 DIAGNOSIS — E039 Hypothyroidism, unspecified: Secondary | ICD-10-CM | POA: Diagnosis not present

## 2020-08-02 DIAGNOSIS — M1990 Unspecified osteoarthritis, unspecified site: Secondary | ICD-10-CM | POA: Diagnosis not present

## 2020-08-02 DIAGNOSIS — C50412 Malignant neoplasm of upper-outer quadrant of left female breast: Secondary | ICD-10-CM | POA: Diagnosis not present

## 2020-08-02 DIAGNOSIS — I129 Hypertensive chronic kidney disease with stage 1 through stage 4 chronic kidney disease, or unspecified chronic kidney disease: Secondary | ICD-10-CM | POA: Diagnosis not present

## 2020-08-02 DIAGNOSIS — E785 Hyperlipidemia, unspecified: Secondary | ICD-10-CM | POA: Diagnosis not present

## 2020-08-02 DIAGNOSIS — E1169 Type 2 diabetes mellitus with other specified complication: Secondary | ICD-10-CM | POA: Diagnosis not present

## 2020-08-02 DIAGNOSIS — K219 Gastro-esophageal reflux disease without esophagitis: Secondary | ICD-10-CM | POA: Diagnosis not present

## 2020-08-08 ENCOUNTER — Other Ambulatory Visit: Payer: Self-pay | Admitting: Family Medicine

## 2020-08-08 DIAGNOSIS — Z1231 Encounter for screening mammogram for malignant neoplasm of breast: Secondary | ICD-10-CM

## 2020-08-13 DIAGNOSIS — E876 Hypokalemia: Secondary | ICD-10-CM | POA: Diagnosis not present

## 2020-08-19 ENCOUNTER — Other Ambulatory Visit: Payer: Self-pay | Admitting: Oncology

## 2020-08-19 DIAGNOSIS — Z853 Personal history of malignant neoplasm of breast: Secondary | ICD-10-CM

## 2020-08-19 DIAGNOSIS — Z1231 Encounter for screening mammogram for malignant neoplasm of breast: Secondary | ICD-10-CM

## 2020-08-22 ENCOUNTER — Other Ambulatory Visit: Payer: Self-pay

## 2020-08-22 ENCOUNTER — Ambulatory Visit
Admission: RE | Admit: 2020-08-22 | Discharge: 2020-08-22 | Disposition: A | Discharge status: Home / Self Care | Payer: PPO | Source: Ambulatory Visit | Attending: Family Medicine | Admitting: Family Medicine

## 2020-08-22 DIAGNOSIS — R922 Inconclusive mammogram: Secondary | ICD-10-CM | POA: Diagnosis not present

## 2020-08-22 DIAGNOSIS — Z853 Personal history of malignant neoplasm of breast: Secondary | ICD-10-CM

## 2020-09-03 DIAGNOSIS — M5416 Radiculopathy, lumbar region: Secondary | ICD-10-CM | POA: Diagnosis not present

## 2020-09-16 DIAGNOSIS — N183 Chronic kidney disease, stage 3 unspecified: Secondary | ICD-10-CM | POA: Diagnosis not present

## 2020-09-23 DIAGNOSIS — M8580 Other specified disorders of bone density and structure, unspecified site: Secondary | ICD-10-CM | POA: Diagnosis not present

## 2020-09-23 DIAGNOSIS — Z87820 Personal history of traumatic brain injury: Secondary | ICD-10-CM | POA: Diagnosis not present

## 2020-09-23 DIAGNOSIS — H2513 Age-related nuclear cataract, bilateral: Secondary | ICD-10-CM | POA: Diagnosis not present

## 2020-09-23 DIAGNOSIS — E119 Type 2 diabetes mellitus without complications: Secondary | ICD-10-CM | POA: Diagnosis not present

## 2020-09-23 DIAGNOSIS — S32009A Unspecified fracture of unspecified lumbar vertebra, initial encounter for closed fracture: Secondary | ICD-10-CM | POA: Diagnosis not present

## 2020-09-25 DIAGNOSIS — C50412 Malignant neoplasm of upper-outer quadrant of left female breast: Secondary | ICD-10-CM | POA: Diagnosis not present

## 2020-09-25 DIAGNOSIS — N183 Chronic kidney disease, stage 3 unspecified: Secondary | ICD-10-CM | POA: Diagnosis not present

## 2020-09-25 DIAGNOSIS — K219 Gastro-esophageal reflux disease without esophagitis: Secondary | ICD-10-CM | POA: Diagnosis not present

## 2020-09-25 DIAGNOSIS — I129 Hypertensive chronic kidney disease with stage 1 through stage 4 chronic kidney disease, or unspecified chronic kidney disease: Secondary | ICD-10-CM | POA: Diagnosis not present

## 2020-09-25 DIAGNOSIS — F331 Major depressive disorder, recurrent, moderate: Secondary | ICD-10-CM | POA: Diagnosis not present

## 2020-09-25 DIAGNOSIS — E1169 Type 2 diabetes mellitus with other specified complication: Secondary | ICD-10-CM | POA: Diagnosis not present

## 2020-09-25 DIAGNOSIS — M1990 Unspecified osteoarthritis, unspecified site: Secondary | ICD-10-CM | POA: Diagnosis not present

## 2020-09-25 DIAGNOSIS — E785 Hyperlipidemia, unspecified: Secondary | ICD-10-CM | POA: Diagnosis not present

## 2020-09-25 DIAGNOSIS — E039 Hypothyroidism, unspecified: Secondary | ICD-10-CM | POA: Diagnosis not present

## 2020-09-26 DIAGNOSIS — M5416 Radiculopathy, lumbar region: Secondary | ICD-10-CM | POA: Diagnosis not present

## 2020-10-07 DIAGNOSIS — K219 Gastro-esophageal reflux disease without esophagitis: Secondary | ICD-10-CM | POA: Diagnosis not present

## 2020-10-07 DIAGNOSIS — I129 Hypertensive chronic kidney disease with stage 1 through stage 4 chronic kidney disease, or unspecified chronic kidney disease: Secondary | ICD-10-CM | POA: Diagnosis not present

## 2020-10-07 DIAGNOSIS — E785 Hyperlipidemia, unspecified: Secondary | ICD-10-CM | POA: Diagnosis not present

## 2020-10-07 DIAGNOSIS — E039 Hypothyroidism, unspecified: Secondary | ICD-10-CM | POA: Diagnosis not present

## 2020-10-07 DIAGNOSIS — E1169 Type 2 diabetes mellitus with other specified complication: Secondary | ICD-10-CM | POA: Diagnosis not present

## 2020-10-07 DIAGNOSIS — N183 Chronic kidney disease, stage 3 unspecified: Secondary | ICD-10-CM | POA: Diagnosis not present

## 2020-10-07 DIAGNOSIS — F331 Major depressive disorder, recurrent, moderate: Secondary | ICD-10-CM | POA: Diagnosis not present

## 2020-10-07 DIAGNOSIS — M1990 Unspecified osteoarthritis, unspecified site: Secondary | ICD-10-CM | POA: Diagnosis not present

## 2020-10-21 DIAGNOSIS — M25511 Pain in right shoulder: Secondary | ICD-10-CM | POA: Diagnosis not present

## 2020-10-30 DIAGNOSIS — I129 Hypertensive chronic kidney disease with stage 1 through stage 4 chronic kidney disease, or unspecified chronic kidney disease: Secondary | ICD-10-CM | POA: Diagnosis not present

## 2020-10-30 DIAGNOSIS — E1169 Type 2 diabetes mellitus with other specified complication: Secondary | ICD-10-CM | POA: Diagnosis not present

## 2020-10-30 DIAGNOSIS — E039 Hypothyroidism, unspecified: Secondary | ICD-10-CM | POA: Diagnosis not present

## 2020-10-30 DIAGNOSIS — E785 Hyperlipidemia, unspecified: Secondary | ICD-10-CM | POA: Diagnosis not present

## 2020-10-30 DIAGNOSIS — F331 Major depressive disorder, recurrent, moderate: Secondary | ICD-10-CM | POA: Diagnosis not present

## 2020-10-30 DIAGNOSIS — Z7984 Long term (current) use of oral hypoglycemic drugs: Secondary | ICD-10-CM | POA: Diagnosis not present

## 2020-10-30 DIAGNOSIS — N183 Chronic kidney disease, stage 3 unspecified: Secondary | ICD-10-CM | POA: Diagnosis not present

## 2020-10-30 DIAGNOSIS — E559 Vitamin D deficiency, unspecified: Secondary | ICD-10-CM | POA: Diagnosis not present

## 2020-11-04 ENCOUNTER — Other Ambulatory Visit: Payer: Self-pay | Admitting: *Deleted

## 2020-11-04 DIAGNOSIS — C50412 Malignant neoplasm of upper-outer quadrant of left female breast: Secondary | ICD-10-CM

## 2020-11-04 DIAGNOSIS — Z170 Estrogen receptor positive status [ER+]: Secondary | ICD-10-CM
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
## 2020-11-05 ENCOUNTER — Other Ambulatory Visit: Payer: Self-pay

## 2020-11-05 ENCOUNTER — Inpatient Hospital Stay: Payer: PPO

## 2020-11-05 ENCOUNTER — Inpatient Hospital Stay: Payer: PPO | Attending: Oncology | Admitting: Oncology

## 2020-11-05 VITALS — BP 137/56 | HR 64 | Temp 97.7°F | Resp 16 | Ht 62.0 in | Wt 175.3 lb

## 2020-11-05 DIAGNOSIS — Z170 Estrogen receptor positive status [ER+]: Secondary | ICD-10-CM

## 2020-11-05 DIAGNOSIS — Z79811 Long term (current) use of aromatase inhibitors: Secondary | ICD-10-CM | POA: Insufficient documentation

## 2020-11-05 DIAGNOSIS — S069X0S Unspecified intracranial injury without loss of consciousness, sequela: Secondary | ICD-10-CM

## 2020-11-05 DIAGNOSIS — C50412 Malignant neoplasm of upper-outer quadrant of left female breast: Secondary | ICD-10-CM

## 2020-11-05 LAB — CBC WITH DIFFERENTIAL (CANCER CENTER ONLY)
Abs Immature Granulocytes: 0.28 10*3/uL — ABNORMAL HIGH (ref 0.00–0.07)
Basophils Absolute: 0.1 10*3/uL (ref 0.0–0.1)
Basophils Relative: 0 %
Eosinophils Absolute: 0.2 10*3/uL (ref 0.0–0.5)
Eosinophils Relative: 2 %
HCT: 42.1 % (ref 36.0–46.0)
Hemoglobin: 14 g/dL (ref 12.0–15.0)
Immature Granulocytes: 2 %
Lymphocytes Relative: 30 %
Lymphs Abs: 4.5 10*3/uL — ABNORMAL HIGH (ref 0.7–4.0)
MCH: 28.3 pg (ref 26.0–34.0)
MCHC: 33.3 g/dL (ref 30.0–36.0)
MCV: 85.2 fL (ref 80.0–100.0)
Monocytes Absolute: 0.9 10*3/uL (ref 0.1–1.0)
Monocytes Relative: 6 %
Neutro Abs: 8.9 10*3/uL — ABNORMAL HIGH (ref 1.7–7.7)
Neutrophils Relative %: 60 %
Platelet Count: 275 10*3/uL (ref 150–400)
RBC: 4.94 MIL/uL (ref 3.87–5.11)
RDW: 14.5 % (ref 11.5–15.5)
WBC Count: 14.8 10*3/uL — ABNORMAL HIGH (ref 4.0–10.5)
nRBC: 0 % (ref 0.0–0.2)

## 2020-11-05 LAB — CMP (CANCER CENTER ONLY)
ALT: 18 U/L (ref 0–44)
AST: 11 U/L — ABNORMAL LOW (ref 15–41)
Albumin: 3.7 g/dL (ref 3.5–5.0)
Alkaline Phosphatase: 107 U/L (ref 38–126)
Anion gap: 13 (ref 5–15)
BUN: 33 mg/dL — ABNORMAL HIGH (ref 8–23)
CO2: 26 mmol/L (ref 22–32)
Calcium: 9.1 mg/dL (ref 8.9–10.3)
Chloride: 102 mmol/L (ref 98–111)
Creatinine: 1.75 mg/dL — ABNORMAL HIGH (ref 0.44–1.00)
GFR, Estimated: 29 mL/min — ABNORMAL LOW (ref >60)
Glucose, Bld: 160 mg/dL — ABNORMAL HIGH (ref 70–99)
Potassium: 3.5 mmol/L (ref 3.5–5.1)
Sodium: 141 mmol/L (ref 135–145)
Total Bilirubin: 0.5 mg/dL (ref 0.3–1.2)
Total Protein: 6.8 g/dL (ref 6.5–8.1)

## 2020-11-18 DIAGNOSIS — I129 Hypertensive chronic kidney disease with stage 1 through stage 4 chronic kidney disease, or unspecified chronic kidney disease: Secondary | ICD-10-CM | POA: Diagnosis not present

## 2020-11-18 DIAGNOSIS — G894 Chronic pain syndrome: Secondary | ICD-10-CM | POA: Diagnosis not present

## 2020-11-18 DIAGNOSIS — N183 Chronic kidney disease, stage 3 unspecified: Secondary | ICD-10-CM | POA: Diagnosis not present

## 2020-11-22 DIAGNOSIS — H11442 Conjunctival cysts, left eye: Secondary | ICD-10-CM | POA: Diagnosis not present

## 2020-12-16 DIAGNOSIS — E1169 Type 2 diabetes mellitus with other specified complication: Secondary | ICD-10-CM | POA: Diagnosis not present

## 2020-12-16 DIAGNOSIS — I129 Hypertensive chronic kidney disease with stage 1 through stage 4 chronic kidney disease, or unspecified chronic kidney disease: Secondary | ICD-10-CM | POA: Diagnosis not present

## 2020-12-16 DIAGNOSIS — E785 Hyperlipidemia, unspecified: Secondary | ICD-10-CM | POA: Diagnosis not present

## 2020-12-16 DIAGNOSIS — M1990 Unspecified osteoarthritis, unspecified site: Secondary | ICD-10-CM | POA: Diagnosis not present

## 2020-12-16 DIAGNOSIS — K219 Gastro-esophageal reflux disease without esophagitis: Secondary | ICD-10-CM | POA: Diagnosis not present

## 2020-12-16 DIAGNOSIS — E039 Hypothyroidism, unspecified: Secondary | ICD-10-CM | POA: Diagnosis not present

## 2020-12-16 DIAGNOSIS — N183 Chronic kidney disease, stage 3 unspecified: Secondary | ICD-10-CM | POA: Diagnosis not present

## 2020-12-16 DIAGNOSIS — F331 Major depressive disorder, recurrent, moderate: Secondary | ICD-10-CM | POA: Diagnosis not present

## 2020-12-23 ENCOUNTER — Other Ambulatory Visit: Payer: Self-pay | Admitting: Family Medicine

## 2020-12-23 DIAGNOSIS — E2839 Other primary ovarian failure: Secondary | ICD-10-CM

## 2020-12-25 DIAGNOSIS — N2581 Secondary hyperparathyroidism of renal origin: Secondary | ICD-10-CM | POA: Diagnosis not present

## 2020-12-25 DIAGNOSIS — E785 Hyperlipidemia, unspecified: Secondary | ICD-10-CM | POA: Diagnosis not present

## 2020-12-25 DIAGNOSIS — I129 Hypertensive chronic kidney disease with stage 1 through stage 4 chronic kidney disease, or unspecified chronic kidney disease: Secondary | ICD-10-CM | POA: Diagnosis not present

## 2020-12-25 DIAGNOSIS — N183 Chronic kidney disease, stage 3 unspecified: Secondary | ICD-10-CM | POA: Diagnosis not present

## 2020-12-25 DIAGNOSIS — Z170 Estrogen receptor positive status [ER+]: Secondary | ICD-10-CM | POA: Diagnosis not present

## 2020-12-25 DIAGNOSIS — D631 Anemia in chronic kidney disease: Secondary | ICD-10-CM | POA: Diagnosis not present

## 2020-12-25 DIAGNOSIS — F32A Depression, unspecified: Secondary | ICD-10-CM | POA: Diagnosis not present

## 2020-12-25 DIAGNOSIS — F419 Anxiety disorder, unspecified: Secondary | ICD-10-CM | POA: Diagnosis not present

## 2020-12-25 DIAGNOSIS — M170 Bilateral primary osteoarthritis of knee: Secondary | ICD-10-CM | POA: Diagnosis not present

## 2020-12-25 DIAGNOSIS — C50919 Malignant neoplasm of unspecified site of unspecified female breast: Secondary | ICD-10-CM | POA: Diagnosis not present

## 2020-12-31 DIAGNOSIS — M1712 Unilateral primary osteoarthritis, left knee: Secondary | ICD-10-CM | POA: Diagnosis not present

## 2020-12-31 DIAGNOSIS — M25562 Pain in left knee: Secondary | ICD-10-CM | POA: Diagnosis not present

## 2021-01-03 DIAGNOSIS — E785 Hyperlipidemia, unspecified: Secondary | ICD-10-CM | POA: Diagnosis not present

## 2021-01-03 DIAGNOSIS — N183 Chronic kidney disease, stage 3 unspecified: Secondary | ICD-10-CM | POA: Diagnosis not present

## 2021-01-03 DIAGNOSIS — K219 Gastro-esophageal reflux disease without esophagitis: Secondary | ICD-10-CM | POA: Diagnosis not present

## 2021-01-03 DIAGNOSIS — C50412 Malignant neoplasm of upper-outer quadrant of left female breast: Secondary | ICD-10-CM | POA: Diagnosis not present

## 2021-01-03 DIAGNOSIS — I129 Hypertensive chronic kidney disease with stage 1 through stage 4 chronic kidney disease, or unspecified chronic kidney disease: Secondary | ICD-10-CM | POA: Diagnosis not present

## 2021-01-03 DIAGNOSIS — F331 Major depressive disorder, recurrent, moderate: Secondary | ICD-10-CM | POA: Diagnosis not present

## 2021-01-03 DIAGNOSIS — M1990 Unspecified osteoarthritis, unspecified site: Secondary | ICD-10-CM | POA: Diagnosis not present

## 2021-01-03 DIAGNOSIS — E1169 Type 2 diabetes mellitus with other specified complication: Secondary | ICD-10-CM | POA: Diagnosis not present

## 2021-01-03 DIAGNOSIS — E039 Hypothyroidism, unspecified: Secondary | ICD-10-CM | POA: Diagnosis not present

## 2021-01-08 DIAGNOSIS — N183 Chronic kidney disease, stage 3 unspecified: Secondary | ICD-10-CM | POA: Diagnosis not present

## 2021-01-22 DIAGNOSIS — M25562 Pain in left knee: Secondary | ICD-10-CM | POA: Diagnosis not present

## 2021-01-22 DIAGNOSIS — M1712 Unilateral primary osteoarthritis, left knee: Secondary | ICD-10-CM | POA: Diagnosis not present

## 2021-01-23 DIAGNOSIS — L720 Epidermal cyst: Secondary | ICD-10-CM | POA: Diagnosis not present

## 2021-01-23 DIAGNOSIS — L821 Other seborrheic keratosis: Secondary | ICD-10-CM | POA: Diagnosis not present

## 2021-01-23 DIAGNOSIS — L820 Inflamed seborrheic keratosis: Secondary | ICD-10-CM | POA: Diagnosis not present

## 2021-01-23 DIAGNOSIS — L814 Other melanin hyperpigmentation: Secondary | ICD-10-CM | POA: Diagnosis not present

## 2021-01-23 DIAGNOSIS — X32XXXS Exposure to sunlight, sequela: Secondary | ICD-10-CM | POA: Diagnosis not present

## 2021-02-06 DIAGNOSIS — Z7984 Long term (current) use of oral hypoglycemic drugs: Secondary | ICD-10-CM | POA: Diagnosis not present

## 2021-02-06 DIAGNOSIS — E1151 Type 2 diabetes mellitus with diabetic peripheral angiopathy without gangrene: Secondary | ICD-10-CM | POA: Diagnosis not present

## 2021-02-06 DIAGNOSIS — E1122 Type 2 diabetes mellitus with diabetic chronic kidney disease: Secondary | ICD-10-CM | POA: Diagnosis not present

## 2021-02-06 DIAGNOSIS — E039 Hypothyroidism, unspecified: Secondary | ICD-10-CM | POA: Diagnosis not present

## 2021-02-06 DIAGNOSIS — Z79890 Hormone replacement therapy (postmenopausal): Secondary | ICD-10-CM | POA: Diagnosis not present

## 2021-02-06 DIAGNOSIS — N1832 Chronic kidney disease, stage 3b: Secondary | ICD-10-CM | POA: Diagnosis not present

## 2021-02-24 DIAGNOSIS — E1169 Type 2 diabetes mellitus with other specified complication: Secondary | ICD-10-CM | POA: Diagnosis not present

## 2021-02-24 DIAGNOSIS — I129 Hypertensive chronic kidney disease with stage 1 through stage 4 chronic kidney disease, or unspecified chronic kidney disease: Secondary | ICD-10-CM | POA: Diagnosis not present

## 2021-02-24 DIAGNOSIS — G894 Chronic pain syndrome: Secondary | ICD-10-CM | POA: Diagnosis not present

## 2021-02-24 DIAGNOSIS — Z7984 Long term (current) use of oral hypoglycemic drugs: Secondary | ICD-10-CM | POA: Diagnosis not present

## 2021-02-24 DIAGNOSIS — F331 Major depressive disorder, recurrent, moderate: Secondary | ICD-10-CM | POA: Diagnosis not present

## 2021-02-24 DIAGNOSIS — E785 Hyperlipidemia, unspecified: Secondary | ICD-10-CM | POA: Diagnosis not present

## 2021-02-24 DIAGNOSIS — N183 Chronic kidney disease, stage 3 unspecified: Secondary | ICD-10-CM | POA: Diagnosis not present

## 2021-03-17 DIAGNOSIS — N183 Chronic kidney disease, stage 3 unspecified: Secondary | ICD-10-CM | POA: Diagnosis not present

## 2021-03-31 DIAGNOSIS — E039 Hypothyroidism, unspecified: Secondary | ICD-10-CM | POA: Diagnosis not present

## 2021-03-31 DIAGNOSIS — E1169 Type 2 diabetes mellitus with other specified complication: Secondary | ICD-10-CM | POA: Diagnosis not present

## 2021-03-31 DIAGNOSIS — I129 Hypertensive chronic kidney disease with stage 1 through stage 4 chronic kidney disease, or unspecified chronic kidney disease: Secondary | ICD-10-CM | POA: Diagnosis not present

## 2021-04-05 DIAGNOSIS — M5416 Radiculopathy, lumbar region: Secondary | ICD-10-CM | POA: Diagnosis not present

## 2021-04-29 DIAGNOSIS — L820 Inflamed seborrheic keratosis: Secondary | ICD-10-CM | POA: Diagnosis not present

## 2021-04-29 DIAGNOSIS — L821 Other seborrheic keratosis: Secondary | ICD-10-CM | POA: Diagnosis not present

## 2021-04-29 DIAGNOSIS — Z85828 Personal history of other malignant neoplasm of skin: Secondary | ICD-10-CM | POA: Diagnosis not present

## 2021-04-29 DIAGNOSIS — D1801 Hemangioma of skin and subcutaneous tissue: Secondary | ICD-10-CM | POA: Diagnosis not present

## 2021-04-29 DIAGNOSIS — X32XXXS Exposure to sunlight, sequela: Secondary | ICD-10-CM | POA: Diagnosis not present

## 2021-04-29 DIAGNOSIS — L570 Actinic keratosis: Secondary | ICD-10-CM | POA: Diagnosis not present

## 2021-04-29 DIAGNOSIS — L814 Other melanin hyperpigmentation: Secondary | ICD-10-CM | POA: Diagnosis not present

## 2021-04-29 DIAGNOSIS — D239 Other benign neoplasm of skin, unspecified: Secondary | ICD-10-CM | POA: Diagnosis not present

## 2021-05-06 DIAGNOSIS — M1712 Unilateral primary osteoarthritis, left knee: Secondary | ICD-10-CM | POA: Diagnosis not present

## 2021-05-13 DIAGNOSIS — G63 Polyneuropathy in diseases classified elsewhere: Secondary | ICD-10-CM | POA: Diagnosis not present

## 2021-05-13 DIAGNOSIS — E1169 Type 2 diabetes mellitus with other specified complication: Secondary | ICD-10-CM | POA: Diagnosis not present

## 2021-05-13 DIAGNOSIS — E1136 Type 2 diabetes mellitus with diabetic cataract: Secondary | ICD-10-CM | POA: Diagnosis not present

## 2021-05-13 DIAGNOSIS — E785 Hyperlipidemia, unspecified: Secondary | ICD-10-CM | POA: Diagnosis not present

## 2021-05-13 DIAGNOSIS — E1165 Type 2 diabetes mellitus with hyperglycemia: Secondary | ICD-10-CM | POA: Diagnosis not present

## 2021-05-13 DIAGNOSIS — F330 Major depressive disorder, recurrent, mild: Secondary | ICD-10-CM | POA: Diagnosis not present

## 2021-05-13 DIAGNOSIS — C50919 Malignant neoplasm of unspecified site of unspecified female breast: Secondary | ICD-10-CM | POA: Diagnosis not present

## 2021-05-13 DIAGNOSIS — E1122 Type 2 diabetes mellitus with diabetic chronic kidney disease: Secondary | ICD-10-CM | POA: Diagnosis not present

## 2021-05-13 DIAGNOSIS — E038 Other specified hypothyroidism: Secondary | ICD-10-CM | POA: Diagnosis not present

## 2021-05-13 DIAGNOSIS — E1142 Type 2 diabetes mellitus with diabetic polyneuropathy: Secondary | ICD-10-CM | POA: Diagnosis not present

## 2021-05-13 DIAGNOSIS — E876 Hypokalemia: Secondary | ICD-10-CM | POA: Diagnosis not present

## 2021-05-13 DIAGNOSIS — E039 Hypothyroidism, unspecified: Secondary | ICD-10-CM | POA: Diagnosis not present

## 2021-05-21 DIAGNOSIS — Z0000 Encounter for general adult medical examination without abnormal findings: Secondary | ICD-10-CM | POA: Diagnosis not present

## 2021-05-21 DIAGNOSIS — E1169 Type 2 diabetes mellitus with other specified complication: Secondary | ICD-10-CM | POA: Diagnosis not present

## 2021-05-21 DIAGNOSIS — G894 Chronic pain syndrome: Secondary | ICD-10-CM | POA: Diagnosis not present

## 2021-05-21 DIAGNOSIS — H9193 Unspecified hearing loss, bilateral: Secondary | ICD-10-CM | POA: Diagnosis not present

## 2021-05-21 DIAGNOSIS — N183 Chronic kidney disease, stage 3 unspecified: Secondary | ICD-10-CM | POA: Diagnosis not present

## 2021-05-21 DIAGNOSIS — E039 Hypothyroidism, unspecified: Secondary | ICD-10-CM | POA: Diagnosis not present

## 2021-05-21 DIAGNOSIS — I129 Hypertensive chronic kidney disease with stage 1 through stage 4 chronic kidney disease, or unspecified chronic kidney disease: Secondary | ICD-10-CM | POA: Diagnosis not present

## 2021-05-21 DIAGNOSIS — F331 Major depressive disorder, recurrent, moderate: Secondary | ICD-10-CM | POA: Diagnosis not present

## 2021-05-21 DIAGNOSIS — C50412 Malignant neoplasm of upper-outer quadrant of left female breast: Secondary | ICD-10-CM | POA: Diagnosis not present

## 2021-05-21 DIAGNOSIS — E559 Vitamin D deficiency, unspecified: Secondary | ICD-10-CM | POA: Diagnosis not present

## 2021-05-21 DIAGNOSIS — E785 Hyperlipidemia, unspecified: Secondary | ICD-10-CM | POA: Diagnosis not present

## 2021-05-30 ENCOUNTER — Other Ambulatory Visit: Payer: PPO

## 2021-06-03 DIAGNOSIS — H04123 Dry eye syndrome of bilateral lacrimal glands: Secondary | ICD-10-CM | POA: Diagnosis not present

## 2021-06-03 DIAGNOSIS — H11442 Conjunctival cysts, left eye: Secondary | ICD-10-CM | POA: Diagnosis not present

## 2021-07-07 DIAGNOSIS — E039 Hypothyroidism, unspecified: Secondary | ICD-10-CM | POA: Diagnosis not present

## 2021-07-07 DIAGNOSIS — K219 Gastro-esophageal reflux disease without esophagitis: Secondary | ICD-10-CM | POA: Diagnosis not present

## 2021-07-07 DIAGNOSIS — I129 Hypertensive chronic kidney disease with stage 1 through stage 4 chronic kidney disease, or unspecified chronic kidney disease: Secondary | ICD-10-CM | POA: Diagnosis not present

## 2021-07-07 DIAGNOSIS — N183 Chronic kidney disease, stage 3 unspecified: Secondary | ICD-10-CM | POA: Diagnosis not present

## 2021-07-07 DIAGNOSIS — E785 Hyperlipidemia, unspecified: Secondary | ICD-10-CM | POA: Diagnosis not present

## 2021-07-07 DIAGNOSIS — E1169 Type 2 diabetes mellitus with other specified complication: Secondary | ICD-10-CM | POA: Diagnosis not present

## 2021-07-22 DIAGNOSIS — M5416 Radiculopathy, lumbar region: Secondary | ICD-10-CM | POA: Diagnosis not present

## 2021-07-24 DIAGNOSIS — L820 Inflamed seborrheic keratosis: Secondary | ICD-10-CM | POA: Diagnosis not present

## 2021-07-24 DIAGNOSIS — L720 Epidermal cyst: Secondary | ICD-10-CM | POA: Diagnosis not present

## 2021-07-28 ENCOUNTER — Other Ambulatory Visit: Payer: Self-pay | Admitting: Family Medicine

## 2021-07-28 DIAGNOSIS — Z1231 Encounter for screening mammogram for malignant neoplasm of breast: Secondary | ICD-10-CM

## 2021-07-31 DIAGNOSIS — R200 Anesthesia of skin: Secondary | ICD-10-CM | POA: Diagnosis not present

## 2021-08-19 DIAGNOSIS — S838X2A Sprain of other specified parts of left knee, initial encounter: Secondary | ICD-10-CM | POA: Diagnosis not present

## 2021-08-20 DIAGNOSIS — E039 Hypothyroidism, unspecified: Secondary | ICD-10-CM | POA: Diagnosis not present

## 2021-08-20 DIAGNOSIS — N183 Chronic kidney disease, stage 3 unspecified: Secondary | ICD-10-CM | POA: Diagnosis not present

## 2021-08-20 DIAGNOSIS — K219 Gastro-esophageal reflux disease without esophagitis: Secondary | ICD-10-CM | POA: Diagnosis not present

## 2021-08-20 DIAGNOSIS — E785 Hyperlipidemia, unspecified: Secondary | ICD-10-CM | POA: Diagnosis not present

## 2021-08-20 DIAGNOSIS — E1169 Type 2 diabetes mellitus with other specified complication: Secondary | ICD-10-CM | POA: Diagnosis not present

## 2021-08-20 DIAGNOSIS — I129 Hypertensive chronic kidney disease with stage 1 through stage 4 chronic kidney disease, or unspecified chronic kidney disease: Secondary | ICD-10-CM | POA: Diagnosis not present

## 2021-08-22 DIAGNOSIS — M25562 Pain in left knee: Secondary | ICD-10-CM | POA: Diagnosis not present

## 2021-08-22 DIAGNOSIS — M1712 Unilateral primary osteoarthritis, left knee: Secondary | ICD-10-CM | POA: Diagnosis not present

## 2021-08-25 ENCOUNTER — Ambulatory Visit
Admission: RE | Admit: 2021-08-25 | Discharge: 2021-08-25 | Disposition: A | Discharge status: Home / Self Care | Payer: PPO | Source: Ambulatory Visit | Attending: Family Medicine | Admitting: Family Medicine

## 2021-08-25 DIAGNOSIS — Z1231 Encounter for screening mammogram for malignant neoplasm of breast: Secondary | ICD-10-CM

## 2021-08-27 DIAGNOSIS — K5900 Constipation, unspecified: Secondary | ICD-10-CM | POA: Diagnosis not present

## 2021-08-27 DIAGNOSIS — R609 Edema, unspecified: Secondary | ICD-10-CM | POA: Diagnosis not present

## 2021-08-27 DIAGNOSIS — R32 Unspecified urinary incontinence: Secondary | ICD-10-CM | POA: Diagnosis not present

## 2021-08-27 DIAGNOSIS — R351 Nocturia: Secondary | ICD-10-CM | POA: Diagnosis not present

## 2021-09-24 DIAGNOSIS — E785 Hyperlipidemia, unspecified: Secondary | ICD-10-CM | POA: Diagnosis not present

## 2021-09-24 DIAGNOSIS — C50412 Malignant neoplasm of upper-outer quadrant of left female breast: Secondary | ICD-10-CM | POA: Diagnosis not present

## 2021-09-24 DIAGNOSIS — I129 Hypertensive chronic kidney disease with stage 1 through stage 4 chronic kidney disease, or unspecified chronic kidney disease: Secondary | ICD-10-CM | POA: Diagnosis not present

## 2021-09-24 DIAGNOSIS — N183 Chronic kidney disease, stage 3 unspecified: Secondary | ICD-10-CM | POA: Diagnosis not present

## 2021-09-24 DIAGNOSIS — E1169 Type 2 diabetes mellitus with other specified complication: Secondary | ICD-10-CM | POA: Diagnosis not present

## 2021-09-24 DIAGNOSIS — E039 Hypothyroidism, unspecified: Secondary | ICD-10-CM | POA: Diagnosis not present

## 2021-09-24 DIAGNOSIS — F331 Major depressive disorder, recurrent, moderate: Secondary | ICD-10-CM | POA: Diagnosis not present

## 2021-09-24 DIAGNOSIS — M25562 Pain in left knee: Secondary | ICD-10-CM | POA: Diagnosis not present

## 2021-09-25 DIAGNOSIS — Z7984 Long term (current) use of oral hypoglycemic drugs: Secondary | ICD-10-CM | POA: Diagnosis not present

## 2021-09-25 DIAGNOSIS — E119 Type 2 diabetes mellitus without complications: Secondary | ICD-10-CM | POA: Diagnosis not present

## 2021-10-09 DIAGNOSIS — M1712 Unilateral primary osteoarthritis, left knee: Secondary | ICD-10-CM | POA: Diagnosis not present

## 2021-10-15 DIAGNOSIS — L821 Other seborrheic keratosis: Secondary | ICD-10-CM | POA: Diagnosis not present

## 2021-10-15 DIAGNOSIS — D239 Other benign neoplasm of skin, unspecified: Secondary | ICD-10-CM | POA: Diagnosis not present

## 2021-10-15 DIAGNOSIS — L570 Actinic keratosis: Secondary | ICD-10-CM | POA: Diagnosis not present

## 2021-10-15 DIAGNOSIS — D485 Neoplasm of uncertain behavior of skin: Secondary | ICD-10-CM | POA: Diagnosis not present

## 2021-11-05 ENCOUNTER — Other Ambulatory Visit: Payer: PPO

## 2021-11-05 ENCOUNTER — Ambulatory Visit: Payer: PPO | Admitting: Hematology and Oncology

## 2021-11-10 ENCOUNTER — Other Ambulatory Visit: Payer: PPO

## 2021-11-12 ENCOUNTER — Other Ambulatory Visit: Payer: Self-pay | Admitting: *Deleted

## 2021-11-12 DIAGNOSIS — C50412 Malignant neoplasm of upper-outer quadrant of left female breast: Secondary | ICD-10-CM

## 2021-11-13 ENCOUNTER — Encounter: Payer: Self-pay | Admitting: Hematology and Oncology

## 2021-11-13 ENCOUNTER — Inpatient Hospital Stay: Payer: PPO | Admitting: Hematology and Oncology

## 2021-11-13 ENCOUNTER — Other Ambulatory Visit: Payer: Self-pay

## 2021-11-13 ENCOUNTER — Inpatient Hospital Stay: Payer: PPO | Attending: Hematology and Oncology

## 2021-11-13 VITALS — BP 123/48 | HR 95 | Temp 97.2°F | Wt 175.1 lb

## 2021-11-13 DIAGNOSIS — I10 Essential (primary) hypertension: Secondary | ICD-10-CM | POA: Diagnosis not present

## 2021-11-13 DIAGNOSIS — Z923 Personal history of irradiation: Secondary | ICD-10-CM | POA: Diagnosis not present

## 2021-11-13 DIAGNOSIS — C50412 Malignant neoplasm of upper-outer quadrant of left female breast: Secondary | ICD-10-CM | POA: Diagnosis not present

## 2021-11-13 DIAGNOSIS — M129 Arthropathy, unspecified: Secondary | ICD-10-CM | POA: Diagnosis not present

## 2021-11-13 DIAGNOSIS — Z79811 Long term (current) use of aromatase inhibitors: Secondary | ICD-10-CM | POA: Insufficient documentation

## 2021-11-13 DIAGNOSIS — Z79899 Other long term (current) drug therapy: Secondary | ICD-10-CM | POA: Diagnosis not present

## 2021-11-13 DIAGNOSIS — E039 Hypothyroidism, unspecified: Secondary | ICD-10-CM | POA: Diagnosis not present

## 2021-11-13 DIAGNOSIS — Z7984 Long term (current) use of oral hypoglycemic drugs: Secondary | ICD-10-CM | POA: Insufficient documentation

## 2021-11-13 DIAGNOSIS — Z170 Estrogen receptor positive status [ER+]: Secondary | ICD-10-CM

## 2021-11-13 DIAGNOSIS — E119 Type 2 diabetes mellitus without complications: Secondary | ICD-10-CM | POA: Diagnosis not present

## 2021-11-13 DIAGNOSIS — Z8616 Personal history of COVID-19: Secondary | ICD-10-CM | POA: Insufficient documentation

## 2021-11-13 DIAGNOSIS — Z800 Family history of malignant neoplasm of digestive organs: Secondary | ICD-10-CM | POA: Insufficient documentation

## 2021-11-13 LAB — CBC WITH DIFFERENTIAL (CANCER CENTER ONLY)
Abs Immature Granulocytes: 0.07 10*3/uL (ref 0.00–0.07)
Basophils Absolute: 0.1 10*3/uL (ref 0.0–0.1)
Basophils Relative: 1 %
Eosinophils Absolute: 0.2 10*3/uL (ref 0.0–0.5)
Eosinophils Relative: 2 %
HCT: 38.9 % (ref 36.0–46.0)
Hemoglobin: 12.8 g/dL (ref 12.0–15.0)
Immature Granulocytes: 1 %
Lymphocytes Relative: 24 %
Lymphs Abs: 2.2 10*3/uL (ref 0.7–4.0)
MCH: 27.6 pg (ref 26.0–34.0)
MCHC: 32.9 g/dL (ref 30.0–36.0)
MCV: 84 fL (ref 80.0–100.0)
Monocytes Absolute: 0.6 10*3/uL (ref 0.1–1.0)
Monocytes Relative: 7 %
Neutro Abs: 6.2 10*3/uL (ref 1.7–7.7)
Neutrophils Relative %: 65 %
Platelet Count: 254 10*3/uL (ref 150–400)
RBC: 4.63 MIL/uL (ref 3.87–5.11)
RDW: 14.7 % (ref 11.5–15.5)
WBC Count: 9.4 10*3/uL (ref 4.0–10.5)
nRBC: 0 % (ref 0.0–0.2)

## 2021-11-13 LAB — CMP (CANCER CENTER ONLY)
ALT: 14 U/L (ref 0–44)
AST: 13 U/L — ABNORMAL LOW (ref 15–41)
Albumin: 3.9 g/dL (ref 3.5–5.0)
Alkaline Phosphatase: 98 U/L (ref 38–126)
Anion gap: 10 (ref 5–15)
BUN: 19 mg/dL (ref 8–23)
CO2: 28 mmol/L (ref 22–32)
Calcium: 9.7 mg/dL (ref 8.9–10.3)
Chloride: 102 mmol/L (ref 98–111)
Creatinine: 1.4 mg/dL — ABNORMAL HIGH (ref 0.44–1.00)
GFR, Estimated: 38 mL/min — ABNORMAL LOW (ref >60)
Glucose, Bld: 258 mg/dL — ABNORMAL HIGH (ref 70–99)
Potassium: 4.3 mmol/L (ref 3.5–5.1)
Sodium: 140 mmol/L (ref 135–145)
Total Bilirubin: 0.4 mg/dL (ref 0.3–1.2)
Total Protein: 6.9 g/dL (ref 6.5–8.1)

## 2021-11-13 NOTE — Progress Notes (Signed)
[place]  Telephone:(336) [phone] Fax:(336) [phone]   NB: Patient did not show for he 03/15/2017 appt  ID: [name] DOB: Dec 20, 1940  MR#: [id number]  YHC#:[id number]  Patient Care Team: Benay Pike, MD as PCP - General (Hematology and Oncology) Magrinat, Virgie Dad, MD (Inactive) as Consulting Physician (Oncology) Roseanne Kaufman, MD as Consulting Physician (Orthopedic Surgery) Juanita Craver, MD as Consulting Physician (Gastroenterology) Jari Pigg, MD as Consulting Physician (Dermatology) Kathie Rhodes, MD (Inactive) as Consulting Physician (Urology) Kyung Rudd, MD as Consulting Physician (Radiation Oncology) Rolm Bookbinder, MD as Consulting Physician (General Surgery) OTHER MD:  CHIEF COMPLAINT: estrogen receptor positive breast cancer  CURRENT TREATMENT: Anastrozole   INTERVAL HISTORY:  [name] returns today for follow-up of of her estrogen receptor positive breast cancer.  She is here for follow-up on anastrozole but tells me she ran out of her first week of October and hence discontinued it.  She is excited by the fact that she may not have to take it anymore since she is almost on it for 5 years. She denies any new breast changes.  Rest of the pertinent 10 point ROS reviewed and negative.   REVIEW OF SYSTEMS: [name] tells me they have lost 4 relatives in the last year.  2 of them were in-laws.  She is currently looking after her sister-in-law who had knee surgery but otherwise [name] is not exercising regularly.  She does take occasional walks.   COVID 19 VACCINATION STATUS: Had Moderna vaccine x2 then had Covid October 2021, did receive antibiotic infusion; had COVID again subsequently and again received an infusion   HISTORY OF CURRENT ILLNESS:  From the original intake note:  [name] initially palpated a lump to her left breast while watching TV which she brought to medical attention and was evaluated 1 week following by her PCP, Dr. Coralyn Mark.  She then had an unilateral diagnostic left mammography with tomography and CAD and left breast ultrasonography at [facility] on 11/17/2016 showing a 1.6 cm mass in the upper left breast suspicous for malignancy. The left axilla was benign.  Accordingly on 11/20/2016, she proceeded to biopsy of the left breast area in question. The pathology from this procedure showed ([id number]): invasive mammary carcinoma. Prognostic indicators: ER: 100% positive; PR: 5% positive; Both with strong staining intensity. Proliferation marker Ki67: 15% ; HER-2: negative.  The patient's subsequent history is as detailed below.   PAST MEDICAL HISTORY: Past Medical History:  Diagnosis Date   Anxiety    Arthritis    knees   Cancer ([place]) 11/2016   Left breast cancer   Depression    Diabetes mellitus without complication ([place])    Diverticulitis    Diverticulosis    bleeding   GERD (gastroesophageal reflux disease)    Hypertension    Hyperthyroidism    nodule on thyroid, Radioactive, now hypo   Hypothyroidism, iatrogenic    After RI now hypo on synthroid   Personal history of radiation therapy     PAST SURGICAL HISTORY: Past Surgical History:  Procedure Laterality Date   ABDOMINAL HYSTERECTOMY  1977   BLADDER SURGERY     bladder suspension   BREAST EXCISIONAL BIOPSY Right    BREAST LUMPECTOMY Left 2018   BREAST LUMPECTOMY WITH RADIOACTIVE SEED AND SENTINEL LYMPH NODE BIOPSY Left 12/11/2016   Procedure: BREAST LUMPECTOMY WITH RADIOACTIVE SEED AND SENTINEL LYMPH NODE BIOPSY;  Surgeon: Rolm Bookbinder, MD;  Location: [facility];  Service: General;  Laterality: Left;   CHOLECYSTECTOMY  N/A 03/29/2013   Procedure: LAPAROSCOPIC CHOLECYSTECTOMY WITH INTRAOPERATIVE CHOLANGIOGRAM;  Surgeon: Edward Jolly, MD;  Location: [facility];  Service: General;  Laterality: N/A;   JOINT REPLACEMENT Right 2009   total knee replacement   ROTATOR CUFF REPAIR Left    TOTAL KNEE REVISION  12/17/2010    Procedure: TOTAL KNEE REVISION;  Surgeon: Dione Plover Aluisio;  [facility];  Service: Orthopedics;  Laterality: Right;    FAMILY HISTORY: Family History  Problem Relation Age of Onset   Diverticulitis Mother    Alzheimer's disease Mother    Colon cancer Maternal Grandmother    Heart attack Father    Breast cancer Daughter   Her father died at age 47 from heart failure. Her mother died at age 45 from Alzheimer's. She has 1 sister and no brothers. Her maternal grandmother had colon cancer and she is unsure of what age she was diagnosed with colon cancer. Her father was diagnosed with skin cancer in his late 50's, and she notes that he didn't have melanoma. Her daughter had DCIS breast cancer diagnosed at 11. She hasn't had genetic testing completed as of yet. Her oldest daughter had genetic testing which came back normal.      GYNECOLOGIC HISTORY:  No LMP recorded. Patient has had a hysterectomy.  Menarche: 81 years old Age at first live birth: 81 years old GP: GxP3 LMP: Had a hysterectomy Contraceptive: OCP HRT: Yes, Estrogen and Progesterone combination for over 30 years.      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 She has 3 children from her first marriage: [name] age 15 who is Mudlogger of a Location manager in [place]. [name] is 69 in Sales in [place]. [name] is 90 and is a 5th grade teacher in [place]. She has a granddaughter that is 37 years old.  She has 2 great-grandchildren and twins on the way (as of December 2020)              ADVANCED DIRECTIVES: Her oldest daughter, [name] is the [employer] of [place] and can be reached at [phone].    HEALTH MAINTENANCE: Social History   Tobacco Use   Smoking status: Never   Smokeless tobacco: Never  Substance Use Topics   Alcohol use: Yes    Comment: rarely    Colonoscopy:  PAP:  Bone density: at  [facility] on 12/10/2015 found a T score of -1.6  Current Outpatient Medications on File Prior to Visit  Medication Sig Dispense Refill   acetaminophen (TYLENOL) 325 MG tablet Take 1-2 tablets (325-650 mg total) by mouth every 4 (four) hours as needed for mild pain.     amLODipine (NORVASC) 10 MG tablet Take 10 mg by mouth daily.     anastrozole (ARIMIDEX) 1 MG tablet Take 1 tablet (1 mg total) by mouth daily. 90 tablet 3   Azithromycin (ZITHROMAX Z-PAK PO) Take by mouth.     Biotin 1 MG CAPS Take 1 mg by mouth daily.     Cholecalciferol (VITAMIN D-3) 5000 units TABS Take 5,000 Units by mouth daily. 30 tablet    diphenhydrAMINE (BENADRYL) 25 mg capsule Take 1 capsule (25 mg total) by mouth every 6 (six) hours as needed for itching. 30 capsule 0   enalapril (VASOTEC) 10 MG tablet Take 10 mg daily by mouth.     escitalopram (LEXAPRO) 10 MG tablet Take 10  mg daily by mouth.     glimepiride (AMARYL) 1 MG tablet Take 1 mg by mouth 2 (two) times daily.     HYDROcodone-acetaminophen (NORCO/VICODIN) 5-325 MG tablet Take 1 tablet by mouth every 6 (six) hours as needed for moderate pain or severe pain. 28 tablet 0   Levothyroxine Sodium 100 MCG CAPS Take 100 mcg daily by mouth.   1   metoprolol succinate (TOPROL-XL) 50 MG 24 hr tablet Take 50 mg by mouth daily.     potassium chloride SA (KLOR-CON) 20 MEQ tablet Take 40 mEq by mouth 3 (three) times daily.     PREDNISONE, PAK, PO Take by mouth.     rosuvastatin (CRESTOR) 5 MG tablet Take 5 mg by mouth daily.  0   traZODone (DESYREL) 50 MG tablet Take 0.5-1 tablets (25-50 mg total) by mouth at bedtime as needed for sleep. 10 tablet 0   No current facility-administered medications on file prior to visit.   OBJECTIVE: White woman in no acute distress  Vitals:   11/13/21 1330  BP: (!) 123/48  Pulse: 95  Temp: (!) 97.2 F (36.2 C)  SpO2: 98%     Filed Weights   11/13/21 1330  Weight: 175 lb 2 oz (79.4 kg)   Body mass index is 32.03  kg/m.  Sclerae unicteric, EOMs intact Wearing a mask No cervical or supraclavicular adenopathy Lungs no rales or rhonchi Heart regular rate and rhythm Abd soft, nontender, positive bowel sounds MSK no focal spinal tenderness, no upper extremity lymphedema Neuro: nonfocal, well oriented, appropriate affect Breasts: The right breast is unremarkable.  The left breast is status postlumpectomy and radiation.  Palpation is irregular as before but there is no significant change and no evidence of disease recurrence.  Both axillae are benign.  CBC    Component Value Date/Time   WBC 9.4 11/13/2021 1317   WBC 8.1 04/01/2020 0439   RBC 4.63 11/13/2021 1317   HGB 12.8 11/13/2021 1317   HGB 13.1 12/02/2016 1213   HCT 38.9 11/13/2021 1317   HCT 39.4 12/02/2016 1213   PLT 254 11/13/2021 1317   PLT 250 12/02/2016 1213   MCV 84.0 11/13/2021 1317   MCV 86.3 12/02/2016 1213   MCH 27.6 11/13/2021 1317   MCHC 32.9 11/13/2021 1317   RDW 14.7 11/13/2021 1317   RDW 15.1 (H) 12/02/2016 1213   LYMPHSABS 2.2 11/13/2021 1317   LYMPHSABS 2.5 12/02/2016 1213   MONOABS 0.6 11/13/2021 1317   MONOABS 0.5 12/02/2016 1213   EOSABS 0.2 11/13/2021 1317   EOSABS 0.2 12/02/2016 1213   BASOSABS 0.1 11/13/2021 1317   BASOSABS 0.1 12/02/2016 1213     STUDIES: No results found.   ELIGIBLE FOR AVAILABLE RESEARCH PROTOCOL: no   ASSESSMENT: 81 y.o. [place] woman s/p left breast upper outer quadrant biopsy 11/20/2016 for a clinical T1c N0, stage IA invasive ductal carcinoma, grade 1, estrogen and progesterone receptor positive, HER-2 not amplified, with an Mib-1 of 15%  (1) s/p left lumpectomy with sentinel lymph node sampling 12/11/2016 for a pT1c pN0 invasive ductal carcinoma, grade 2, with negative margins  (2) oncotype score of 24 predicted a 10-year risk of recurrence outside the breast of 16% if the patient's only systemic therapy was tamoxifen for 5 years.  It also predicted no significant benefit from  chemotherapy.  (3) adjuvant radiation 01/27/2017 - 02/23/2017  Site/dose:   The patient initially received a dose of 42.5 Gy in 17 fractions to the breast using whole-breast tangent fields.  This was delivered using a 3-D conformal technique. The patient then received a boost to the seroma. This delivered an additional 7.5 Gy in 3 fractions using a 3 field photon technique due to the depth of the seroma. The total dose was 50 Gy.   (4) anastrozole started 03/16/2017  (a) bone density at [facility] 12/10/2015 found a T score of -1.6  (b) repeat bone density 12/15/2017 shows a T score of -1.6 (unchanged).  (c) off anastrozole January 2020 through May 2020 (patient did not refill prescription)  PLAN:  Per Dr. Virgie Dad last visit, she was to complete 5 years of antiestrogen therapy.  She however has discontinued it since early October when she ran out of her current prescription.  She did not call us for refill.  She was hoping that she does not have to go back on it since she almost took it for 5 years. Mammogram July 31 with no mammographic evidence of malignancy. Physical examination today with no concerns.  Postop changes in the left breast.  Heterogeneously dense breasts noted chest like mammogram results. CBC and CMP today without any concerns from oncology standpoint. She would like to be graduated and she wants to come back to follow-up as needed.  She will have her regular mammograms ordered by her PCP Dr. Dema Severin. I have also recommended self breast exam monthly and annual breast exam by Dr. Dema Severin.  Total time spent: 30 minutes  *Total Encounter Time as defined by the Centers for Medicare and Medicaid Services includes, in addition to the face-to-face time of a patient visit (documented in the note above) non-face-to-face time: obtaining and reviewing outside history, ordering and reviewing medications, tests or procedures, care coordination (communications with other health care  professionals or caregivers) and documentation in the medical record.

## 2021-11-20 DIAGNOSIS — M1712 Unilateral primary osteoarthritis, left knee: Secondary | ICD-10-CM | POA: Diagnosis not present

## 2021-11-27 DIAGNOSIS — M1712 Unilateral primary osteoarthritis, left knee: Secondary | ICD-10-CM | POA: Diagnosis not present

## 2021-12-04 DIAGNOSIS — M1712 Unilateral primary osteoarthritis, left knee: Secondary | ICD-10-CM | POA: Diagnosis not present

## 2021-12-16 DIAGNOSIS — N183 Chronic kidney disease, stage 3 unspecified: Secondary | ICD-10-CM | POA: Diagnosis not present

## 2021-12-16 DIAGNOSIS — E1169 Type 2 diabetes mellitus with other specified complication: Secondary | ICD-10-CM | POA: Diagnosis not present

## 2021-12-16 DIAGNOSIS — R5383 Other fatigue: Secondary | ICD-10-CM | POA: Diagnosis not present

## 2021-12-16 DIAGNOSIS — M791 Myalgia, unspecified site: Secondary | ICD-10-CM | POA: Diagnosis not present

## 2021-12-16 DIAGNOSIS — I129 Hypertensive chronic kidney disease with stage 1 through stage 4 chronic kidney disease, or unspecified chronic kidney disease: Secondary | ICD-10-CM | POA: Diagnosis not present

## 2021-12-16 DIAGNOSIS — G629 Polyneuropathy, unspecified: Secondary | ICD-10-CM | POA: Diagnosis not present

## 2021-12-16 DIAGNOSIS — M2550 Pain in unspecified joint: Secondary | ICD-10-CM | POA: Diagnosis not present

## 2021-12-29 DIAGNOSIS — J189 Pneumonia, unspecified organism: Secondary | ICD-10-CM | POA: Diagnosis not present

## 2021-12-29 DIAGNOSIS — R051 Acute cough: Secondary | ICD-10-CM | POA: Diagnosis not present

## 2022-01-08 ENCOUNTER — Emergency Department (HOSPITAL_COMMUNITY): Payer: PPO

## 2022-01-08 ENCOUNTER — Inpatient Hospital Stay (HOSPITAL_COMMUNITY)
Admission: EM | Admit: 2022-01-08 | Discharge: 2022-01-12 | DRG: 291 | Disposition: A | Discharge status: Home / Self Care | Payer: PPO | Attending: Internal Medicine | Admitting: Internal Medicine

## 2022-01-08 ENCOUNTER — Other Ambulatory Visit: Payer: Self-pay

## 2022-01-08 DIAGNOSIS — Z7952 Long term (current) use of systemic steroids: Secondary | ICD-10-CM

## 2022-01-08 DIAGNOSIS — Z8249 Family history of ischemic heart disease and other diseases of the circulatory system: Secondary | ICD-10-CM

## 2022-01-08 DIAGNOSIS — Z66 Do not resuscitate: Secondary | ICD-10-CM | POA: Diagnosis present

## 2022-01-08 DIAGNOSIS — Z803 Family history of malignant neoplasm of breast: Secondary | ICD-10-CM

## 2022-01-08 DIAGNOSIS — Z6832 Body mass index (BMI) 32.0-32.9, adult: Secondary | ICD-10-CM

## 2022-01-08 DIAGNOSIS — I2699 Other pulmonary embolism without acute cor pulmonale: Secondary | ICD-10-CM | POA: Diagnosis not present

## 2022-01-08 DIAGNOSIS — N1832 Chronic kidney disease, stage 3b: Secondary | ICD-10-CM | POA: Diagnosis not present

## 2022-01-08 DIAGNOSIS — Z90710 Acquired absence of both cervix and uterus: Secondary | ICD-10-CM

## 2022-01-08 DIAGNOSIS — I130 Hypertensive heart and chronic kidney disease with heart failure and stage 1 through stage 4 chronic kidney disease, or unspecified chronic kidney disease: Principal | ICD-10-CM | POA: Diagnosis present

## 2022-01-08 DIAGNOSIS — E669 Obesity, unspecified: Secondary | ICD-10-CM | POA: Diagnosis present

## 2022-01-08 DIAGNOSIS — E1165 Type 2 diabetes mellitus with hyperglycemia: Secondary | ICD-10-CM | POA: Diagnosis not present

## 2022-01-08 DIAGNOSIS — Z923 Personal history of irradiation: Secondary | ICD-10-CM | POA: Diagnosis not present

## 2022-01-08 DIAGNOSIS — E785 Hyperlipidemia, unspecified: Secondary | ICD-10-CM | POA: Diagnosis not present

## 2022-01-08 DIAGNOSIS — I272 Pulmonary hypertension, unspecified: Secondary | ICD-10-CM | POA: Diagnosis not present

## 2022-01-08 DIAGNOSIS — Z853 Personal history of malignant neoplasm of breast: Secondary | ICD-10-CM | POA: Diagnosis not present

## 2022-01-08 DIAGNOSIS — J189 Pneumonia, unspecified organism: Secondary | ICD-10-CM | POA: Diagnosis not present

## 2022-01-08 DIAGNOSIS — R0902 Hypoxemia: Secondary | ICD-10-CM | POA: Diagnosis present

## 2022-01-08 DIAGNOSIS — Z7984 Long term (current) use of oral hypoglycemic drugs: Secondary | ICD-10-CM

## 2022-01-08 DIAGNOSIS — I5031 Acute diastolic (congestive) heart failure: Secondary | ICD-10-CM | POA: Diagnosis not present

## 2022-01-08 DIAGNOSIS — I82453 Acute embolism and thrombosis of peroneal vein, bilateral: Secondary | ICD-10-CM | POA: Diagnosis not present

## 2022-01-08 DIAGNOSIS — Z9049 Acquired absence of other specified parts of digestive tract: Secondary | ICD-10-CM

## 2022-01-08 DIAGNOSIS — I110 Hypertensive heart disease with heart failure: Secondary | ICD-10-CM | POA: Diagnosis not present

## 2022-01-08 DIAGNOSIS — K219 Gastro-esophageal reflux disease without esophagitis: Secondary | ICD-10-CM | POA: Diagnosis not present

## 2022-01-08 DIAGNOSIS — F419 Anxiety disorder, unspecified: Secondary | ICD-10-CM | POA: Diagnosis present

## 2022-01-08 DIAGNOSIS — E039 Hypothyroidism, unspecified: Secondary | ICD-10-CM | POA: Diagnosis not present

## 2022-01-08 DIAGNOSIS — Z886 Allergy status to analgesic agent status: Secondary | ICD-10-CM

## 2022-01-08 DIAGNOSIS — Z20822 Contact with and (suspected) exposure to covid-19: Secondary | ICD-10-CM | POA: Diagnosis not present

## 2022-01-08 DIAGNOSIS — Z96651 Presence of right artificial knee joint: Secondary | ICD-10-CM | POA: Diagnosis present

## 2022-01-08 DIAGNOSIS — I5021 Acute systolic (congestive) heart failure: Secondary | ICD-10-CM | POA: Diagnosis not present

## 2022-01-08 DIAGNOSIS — N179 Acute kidney failure, unspecified: Secondary | ICD-10-CM | POA: Diagnosis present

## 2022-01-08 DIAGNOSIS — R059 Cough, unspecified: Secondary | ICD-10-CM | POA: Diagnosis not present

## 2022-01-08 DIAGNOSIS — I509 Heart failure, unspecified: Secondary | ICD-10-CM | POA: Diagnosis not present

## 2022-01-08 DIAGNOSIS — I5033 Acute on chronic diastolic (congestive) heart failure: Secondary | ICD-10-CM

## 2022-01-08 DIAGNOSIS — E1122 Type 2 diabetes mellitus with diabetic chronic kidney disease: Secondary | ICD-10-CM | POA: Diagnosis present

## 2022-01-08 DIAGNOSIS — I82403 Acute embolism and thrombosis of unspecified deep veins of lower extremity, bilateral: Secondary | ICD-10-CM | POA: Diagnosis present

## 2022-01-08 DIAGNOSIS — Z79899 Other long term (current) drug therapy: Secondary | ICD-10-CM

## 2022-01-08 DIAGNOSIS — I959 Hypotension, unspecified: Secondary | ICD-10-CM | POA: Diagnosis not present

## 2022-01-08 DIAGNOSIS — E876 Hypokalemia: Secondary | ICD-10-CM | POA: Diagnosis not present

## 2022-01-08 DIAGNOSIS — R0602 Shortness of breath: Secondary | ICD-10-CM | POA: Diagnosis not present

## 2022-01-08 DIAGNOSIS — Z888 Allergy status to other drugs, medicaments and biological substances status: Secondary | ICD-10-CM

## 2022-01-08 DIAGNOSIS — R0609 Other forms of dyspnea: Secondary | ICD-10-CM | POA: Diagnosis not present

## 2022-01-08 DIAGNOSIS — F32A Depression, unspecified: Secondary | ICD-10-CM | POA: Diagnosis present

## 2022-01-08 DIAGNOSIS — R531 Weakness: Secondary | ICD-10-CM | POA: Diagnosis not present

## 2022-01-08 DIAGNOSIS — R0689 Other abnormalities of breathing: Secondary | ICD-10-CM | POA: Diagnosis not present

## 2022-01-08 DIAGNOSIS — Z885 Allergy status to narcotic agent status: Secondary | ICD-10-CM

## 2022-01-08 DIAGNOSIS — I10 Essential (primary) hypertension: Secondary | ICD-10-CM | POA: Diagnosis not present

## 2022-01-08 DIAGNOSIS — I50811 Acute right heart failure: Secondary | ICD-10-CM | POA: Diagnosis not present

## 2022-01-08 DIAGNOSIS — R918 Other nonspecific abnormal finding of lung field: Secondary | ICD-10-CM | POA: Diagnosis not present

## 2022-01-08 LAB — CBC WITH DIFFERENTIAL/PLATELET
Abs Immature Granulocytes: 0.1 10*3/uL — ABNORMAL HIGH (ref 0.00–0.07)
Basophils Absolute: 0.1 10*3/uL (ref 0.0–0.1)
Basophils Relative: 1 %
Eosinophils Absolute: 0.1 10*3/uL (ref 0.0–0.5)
Eosinophils Relative: 1 %
HCT: 38.4 % (ref 36.0–46.0)
Hemoglobin: 12.2 g/dL (ref 12.0–15.0)
Immature Granulocytes: 1 %
Lymphocytes Relative: 21 %
Lymphs Abs: 2 10*3/uL (ref 0.7–4.0)
MCH: 26.8 pg (ref 26.0–34.0)
MCHC: 31.8 g/dL (ref 30.0–36.0)
MCV: 84.4 fL (ref 80.0–100.0)
Monocytes Absolute: 0.7 10*3/uL (ref 0.1–1.0)
Monocytes Relative: 7 %
Neutro Abs: 6.6 10*3/uL (ref 1.7–7.7)
Neutrophils Relative %: 69 %
Platelets: 219 10*3/uL (ref 150–400)
RBC: 4.55 MIL/uL (ref 3.87–5.11)
RDW: 14.9 % (ref 11.5–15.5)
WBC: 9.5 10*3/uL (ref 4.0–10.5)
nRBC: 0 % (ref 0.0–0.2)

## 2022-01-08 LAB — URINALYSIS, ROUTINE W REFLEX MICROSCOPIC
Bilirubin Urine: NEGATIVE
Glucose, UA: >=500 mg/dL — AB
Hgb urine dipstick: NEGATIVE
Ketones, ur: NEGATIVE mg/dL
Leukocytes,Ua: NEGATIVE
Nitrite: NEGATIVE
Protein, ur: NEGATIVE mg/dL
Specific Gravity, Urine: 1.009 (ref 1.005–1.030)
pH: 5 (ref 5.0–8.0)

## 2022-01-08 LAB — BASIC METABOLIC PANEL
Anion gap: 15 (ref 5–15)
BUN: 30 mg/dL — ABNORMAL HIGH (ref 8–23)
CO2: 18 mmol/L — ABNORMAL LOW (ref 22–32)
Calcium: 8.8 mg/dL — ABNORMAL LOW (ref 8.9–10.3)
Chloride: 101 mmol/L (ref 98–111)
Creatinine, Ser: 2.42 mg/dL — ABNORMAL HIGH (ref 0.44–1.00)
GFR, Estimated: 20 mL/min — ABNORMAL LOW (ref >60)
Glucose, Bld: 206 mg/dL — ABNORMAL HIGH (ref 70–99)
Potassium: 2.9 mmol/L — ABNORMAL LOW (ref 3.5–5.1)
Sodium: 134 mmol/L — ABNORMAL LOW (ref 135–145)

## 2022-01-08 LAB — RESP PANEL BY RT-PCR (RSV, FLU A&B, COVID)  RVPGX2
Influenza A by PCR: NEGATIVE
Influenza B by PCR: NEGATIVE
Resp Syncytial Virus by PCR: NEGATIVE
SARS Coronavirus 2 by RT PCR: NEGATIVE

## 2022-01-08 LAB — TROPONIN I (HIGH SENSITIVITY)
Troponin I (High Sensitivity): 20 ng/L — ABNORMAL HIGH (ref <18)
Troponin I (High Sensitivity): 15 ng/L (ref <18)

## 2022-01-08 LAB — TSH: TSH: 3.549 u[IU]/mL (ref 0.350–4.500)

## 2022-01-08 LAB — BRAIN NATRIURETIC PEPTIDE: B Natriuretic Peptide: 687.9 pg/mL — ABNORMAL HIGH (ref 0.0–100.0)

## 2022-01-08 MED ORDER — POTASSIUM CHLORIDE CRYS ER 20 MEQ PO TBCR
40.0000 meq | EXTENDED_RELEASE_TABLET | Freq: Once | ORAL | Status: AC
  Administered 2022-01-08: 40 meq via ORAL
  Filled 2022-01-08: qty 2

## 2022-01-08 MED ORDER — SODIUM CHLORIDE 0.9 % IV BOLUS: 1000.0000 mL | Freq: Once | INTRAVENOUS | Status: DC

## 2022-01-08 MED ORDER — SODIUM CHLORIDE 0.9 % IV BOLUS
500.0000 mL | Freq: Once | INTRAVENOUS | Status: AC
  Administered 2022-01-08: 500 mL via INTRAVENOUS

## 2022-01-08 NOTE — ED Provider Notes (Signed)
[name] DEPT Provider Note   CSN: [id number] Arrival date & time: 01/08/22  1800     History  Chief Complaint  Patient presents with   Shortness of [place] is a 81 y.o. female.  Patient as above with significant medical history as below, including Breast cancer Dr Jana Hakim (recently completed 5 yrs anti-estrogen therapy), HTN, diverticulitis, anxiety/ arthritis   who presents to the ED with complaint of dib Pt with 10 days of dib, was treated for PNA but has since completed ABX around 1 wk ago Symptoms initially did improve but DIB has worsened She continues to have cough but no longer productive No chest pain, no fevers or chills, no n/v  Dib is primarily exertional, while at rest she feels okay Not on any home oxygen Does report reduced PO intake last few days 2/2 feeling bad  Care primarily at [facility] 2018 with LVEF 87%, no ischemic changes during stress test Dr Oval Linsey    Past Medical History:  Diagnosis Date   Anxiety    Arthritis    knees   Cancer ([place]) 11/2016   Left breast cancer   Depression    Diabetes mellitus without complication ([place])    Diverticulitis    Diverticulosis    bleeding   GERD (gastroesophageal reflux disease)    Hypertension    Hyperthyroidism    nodule on thyroid, Radioactive, now hypo   Hypothyroidism, iatrogenic    After RI now hypo on synthroid   Personal history of radiation therapy     Past Surgical History:  Procedure Laterality Date   ABDOMINAL HYSTERECTOMY  1977   BLADDER SURGERY     bladder suspension   BREAST EXCISIONAL BIOPSY Right    BREAST LUMPECTOMY Left 2018   BREAST LUMPECTOMY WITH RADIOACTIVE SEED AND SENTINEL LYMPH NODE BIOPSY Left 12/11/2016   Procedure: BREAST LUMPECTOMY WITH RADIOACTIVE SEED AND SENTINEL LYMPH NODE BIOPSY;  Surgeon: Rolm Bookbinder, MD;  Location: [place];  Service: General;  Laterality: Left;   CHOLECYSTECTOMY  N/A 03/29/2013   Procedure: LAPAROSCOPIC CHOLECYSTECTOMY WITH INTRAOPERATIVE CHOLANGIOGRAM;  Surgeon: Edward Jolly, MD;  Location: [place];  Service: General;  Laterality: N/A;   JOINT REPLACEMENT Right 2009   total knee replacement   ROTATOR CUFF REPAIR Left    TOTAL KNEE REVISION  12/17/2010   Procedure: TOTAL KNEE REVISION;  Surgeon: Dione Plover Aluisio;  Location: [facility];  Service: Orthopedics;  Laterality: Right;     The history is provided by the patient. No language interpreter was used.  Shortness of Breath Associated symptoms: cough   Associated symptoms: no abdominal pain, no chest pain, no fever, no headaches and no rash        Home Medications Prior to Admission medications   Medication Sig Start Date End Date Taking? Authorizing Provider  acetaminophen (TYLENOL) 325 MG tablet Take 1-2 tablets (325-650 mg total) by mouth every 4 (four) hours as needed for mild pain. 04/02/20   Love, Ivan Anchors, PA-C  amLODipine (NORVASC) 10 MG tablet Take 10 mg by mouth daily. 02/03/20   [provider]  anastrozole (ARIMIDEX) 1 MG tablet Take 1 tablet (1 mg total) by mouth daily. 07/17/20   Magrinat, Virgie Dad, MD  Azithromycin (ZITHROMAX Z-PAK PO) Take by mouth.    [provider]  Biotin 1 MG CAPS Take 1 mg by mouth daily.    [provider]  Cholecalciferol (VITAMIN D-3) 5000 units  TABS Take 5,000 Units by mouth daily. 06/14/17   Magrinat, Virgie Dad, MD  diphenhydrAMINE (BENADRYL) 25 mg capsule Take 1 capsule (25 mg total) by mouth every 6 (six) hours as needed for itching. 03/22/20   Viona Gilmore D, NP  enalapril (VASOTEC) 10 MG tablet Take 10 mg daily by mouth.    [provider]  escitalopram (LEXAPRO) 10 MG tablet Take 10 mg daily by mouth.    [provider]  glimepiride (AMARYL) 1 MG tablet Take 1 mg by mouth 2 (two) times daily.    [provider]  HYDROcodone-acetaminophen (NORCO/VICODIN) 5-325 MG tablet Take 1 tablet by mouth every 6  (six) hours as needed for moderate pain or severe pain. 04/02/20   Love, Ivan Anchors, PA-C  Levothyroxine Sodium 100 MCG CAPS Take 100 mcg daily by mouth.  11/21/15   [provider]  metoprolol succinate (TOPROL-XL) 50 MG 24 hr tablet Take 50 mg by mouth daily. 03/04/20   [provider]  potassium chloride SA (KLOR-CON) 20 MEQ tablet Take 40 mEq by mouth 3 (three) times daily. 02/03/20   [provider]  PREDNISONE, PAK, PO Take by mouth.    [provider]  rosuvastatin (CRESTOR) 5 MG tablet Take 5 mg by mouth daily. 12/04/15   [provider]  traZODone (DESYREL) 50 MG tablet Take 0.5-1 tablets (25-50 mg total) by mouth at bedtime as needed for sleep. 04/02/20   Love, Ivan Anchors, PA-C      Allergies    Amitriptyline, Aspirin, Nsaids, Meperidine hcl, Morphine and related, and Morphine sulfate    Review of Systems   Review of Systems  Constitutional:  Positive for fatigue. Negative for activity change and fever.  HENT:  Negative for facial swelling and trouble swallowing.   Eyes:  Negative for discharge and redness.  Respiratory:  Positive for cough and shortness of breath.   Cardiovascular:  Negative for chest pain and palpitations.  Gastrointestinal:  Negative for abdominal pain and nausea.  Genitourinary:  Negative for dysuria and flank pain.  Musculoskeletal:  Negative for back pain and gait problem.  Skin:  Negative for pallor and rash.  Neurological:  Negative for syncope and headaches.    Physical Exam Updated Vital Signs BP (!) 104/58   Pulse 83   Temp 98 F (36.7 C) (Oral)   Resp 16   SpO2 (!) 87%  Physical Exam Vitals and nursing note reviewed.  Constitutional:      General: She is not in acute distress.    Appearance: Normal appearance. She is well-developed.  HENT:     Head: Normocephalic and atraumatic.     Right Ear: External ear normal.     Left Ear: External ear normal.     Nose: Nose normal.     Mouth/Throat:     Mouth:  Mucous membranes are moist.  Eyes:     General: No scleral icterus.       Right eye: No discharge.        Left eye: No discharge.  Cardiovascular:     Rate and Rhythm: Normal rate and regular rhythm.     Pulses: Normal pulses.     Heart sounds: Normal heart sounds.  Pulmonary:     Effort: Pulmonary effort is normal. No respiratory distress.     Breath sounds: Normal breath sounds.  Abdominal:     General: Abdomen is flat.     Tenderness: There is no abdominal tenderness.  Musculoskeletal:  General: Normal range of motion.     Cervical back: Normal range of motion.     Right lower leg: No edema.     Left lower leg: No edema.  Skin:    General: Skin is warm and dry.     Capillary Refill: Capillary refill takes less than 2 seconds.  Neurological:     Mental Status: She is alert.  Psychiatric:        Mood and Affect: Mood normal.        Behavior: Behavior normal.     ED Results / Procedures / Treatments   Labs (all labs ordered are listed, but only abnormal results are displayed) Labs Reviewed  BASIC METABOLIC PANEL - Abnormal; Notable for the following components:      Result Value   Sodium 134 (*)    Potassium 2.9 (*)    CO2 18 (*)    Glucose, Bld 206 (*)    BUN 30 (*)    Creatinine, Ser 2.42 (*)    Calcium 8.8 (*)    GFR, Estimated 20 (*)    All other components within normal limits  BRAIN NATRIURETIC PEPTIDE - Abnormal; Notable for the following components:   B Natriuretic Peptide 687.9 (*)    All other components within normal limits  CBC WITH DIFFERENTIAL/PLATELET - Abnormal; Notable for the following components:   Abs Immature Granulocytes 0.10 (*)    All other components within normal limits  URINALYSIS, ROUTINE W REFLEX MICROSCOPIC - Abnormal; Notable for the following components:   Glucose, UA >=500 (*)    Bacteria, UA RARE (*)    All other components within normal limits  TROPONIN I (HIGH SENSITIVITY) - Abnormal; Notable for the following  components:   Troponin I (High Sensitivity) 20 (*)    All other components within normal limits  RESP PANEL BY RT-PCR (RSV, FLU A&B, COVID)  RVPGX2  TSH  TROPONIN I (HIGH SENSITIVITY)    EKG EKG Interpretation  Date/Time:  Thursday January 08 2022 18:49:38 EST Ventricular Rate:  83 PR Interval:  187 QRS Duration: 87 QT Interval:  426 QTC Calculation: 501 R Axis:   68 Text Interpretation: Sinus rhythm Anterior infarct, age indeterminate Prolonged QT interval Confirmed by Wynona Dove ([id number]) on 01/08/2022 10:33:04 PM  Radiology DG Chest 2 View  Result Date: 01/08/2022 CLINICAL DATA:  Difficulty in breathing. Shortness of breath for 10 days. EXAM: CHEST - 2 VIEW COMPARISON:  03/19/2020 FINDINGS: Normal heart size and pulmonary vascularity. No focal airspace disease or consolidation in the lungs. No blunting of costophrenic angles. No pneumothorax. Mediastinal contours appear intact. Degenerative changes in the spine. IMPRESSION: No active cardiopulmonary disease. Electronically Signed   By: Lucienne Capers M.D.   On: 01/08/2022 19:10    Procedures .Critical Care  Performed by: [name], DO Authorized by: [name], DO   Critical care provider statement:    Critical care time (minutes):  30   Critical care time was exclusive of:  Separately billable procedures and treating other patients   Critical care was necessary to treat or prevent imminent or life-threatening deterioration of the following conditions:  Respiratory failure   Critical care was time spent personally by me on the following activities:  Development of treatment plan with patient or surrogate, discussions with consultants, evaluation of patient's response to treatment, examination of patient, ordering and review of laboratory studies, ordering and review of radiographic studies, ordering and performing treatments and interventions, pulse oximetry, re-evaluation of patient's  condition, review of old charts  and obtaining history from patient or surrogate   Care discussed with: admitting provider       Medications Ordered in ED Medications  potassium chloride SA (KLOR-CON M) CR tablet 40 mEq (has no administration in time range)  sodium chloride 0.9 % bolus 500 mL (500 mLs Intravenous New Bag/Given 01/08/22 2056)    ED Course/ Medical Decision Making/ A&P Clinical Course as of 01/08/22 2322  Thu Jan 08, 2022  2012 Creatinine(!): 2.42 Baseline around 1.5 [SG]  2249 With exertion she does become very dyspneic, will desaturate into 80's, tachypnea, conversational dib [SG]    Clinical Course User Index [SG] [name], DO                           Medical Decision Making Amount and/or Complexity of Data Reviewed Labs: ordered. Decision-making details documented in ED Course. Radiology: ordered.  Risk Prescription drug management. Decision regarding hospitalization.   This patient presents to the ED with chief complaint(s) of dib with pertinent past medical history of as above which further complicates the presenting complaint. The complaint involves an extensive differential diagnosis and also carries with it a high risk of complications and morbidity.    The differential diagnosis includes but not limited to In my evaluation of this patient's dyspnea my DDx includes, but is not limited to, pneumonia, pulmonary embolism, pneumothorax, pulmonary edema, metabolic acidosis, asthma, COPD, cardiac cause, anemia, anxiety, etc.  . Serious etiologies were considered.   The initial plan is to screening labs imaging   Additional history obtained: Additional history obtained from spouse Records reviewed  home meds, prior labs/imaging/ va notes   Independent labs interpretation:  The following labs were independently interpreted:  Cr worse from baseline, today 2.42, baseline around 1.4 K mildly depleted 2.9, replace orally BNP 689, trop initially was 20 but delta is WNL  Independent  visualization of imaging: - I independently visualized the following imaging with scope of interpretation limited to determining acute life threatening conditions related to emergency care: CXR, which revealed wnl  Cardiac monitoring was reviewed and interpreted by myself which shows NSR  Treatment and Reassessment: Small bolus IVF >> symptoms unchanged   Consultation: - Consulted or discussed management/test interpretation w/ external professional: na  Consideration for admission or further workup: Admission was considered    Pt here with dib, exertional, hypoxia with exertion requiring 2LNC. At rest she is comfortable. Concern for new onset CHF, on exam she does not appear severely fluid overloaded, CXR is wnl. Given hypoxia and AKI would recommend admission for gentle diuresis given AKI. Recommend echo. Pt agreeable. Spoke with Dr Nevada Crane who accepts pt for admission.    Social Determinants of health: Social History   Tobacco Use   Smoking status: Never   Smokeless tobacco: Never  Substance Use Topics   Alcohol use: Yes    Comment: rarely   Drug use: No            Final Clinical Impression(s) / ED Diagnoses Final diagnoses:  AKI (acute kidney injury) ([place])  Acute congestive heart failure, unspecified heart failure type ([place])  Hypokalemia  Exertional dyspnea    Rx / DC Orders ED Discharge Orders     None         [name], DO 01/08/22 2323

## 2022-01-08 NOTE — ED Triage Notes (Signed)
EMS reports sent from Urgent Care, SOB x 10 days, with increase on ambulation. Urgent care did Xray (clear) and Covid (negative) lung sounds clear. EMS noted hypotensive on arrival  BP 118/58 HR 80 RR 18 Sp02 94 RA

## 2022-01-08 NOTE — H&P (Signed)
History and Physical  [name] WNU:[id number] DOB: 1940-09-26 DOA: 01/08/2022  Referring physician: Dr. Pearline Cables, House. PCP: [name], MD  Outpatient Specialists: Oncology, urology. Patient coming from: Home.  Chief Complaint: Shortness of breath  HPI: [name] is a 81 y.o. female with medical history significant for breast cancer followed by Dr. Jana Hakim, essential hypertension, hyperlipidemia, type 2 diabetes, hypothyroidism, chronic anxiety/depression, who presented to the [facility] ED with complaints of shortness of breath, progressively worsening for the past 10 days.  Worse with ambulation and exertion.  Treated for pneumonia about 2 weeks ago and completed her antibiotics a week ago.  For the past 3 to 4 days she had recurrence of symptoms with poor oral intake.  EMS was activated.  Upon EMS arrival, she was noted to have soft BPs and was hypoxic with O2 saturation in the mid 80s on room air.  She was going to the ED for further evaluation  In the ED, chest x-ray with no evidence of recurrent pneumonia.  O2 saturation improved on 2 L nasal cannula.  Labs concerning for acute CHF.  BNP greater than 700.  Also concerning for AKI with creatinine above baseline.  EDP requested admission for further evaluation.  Admitted by [facility], hospitalist service.  ED Course: Tmax 98.4.  BP 101/50, pulse 84, respiration rate 25, saturation 90% on room air.  Review of Systems: Review of systems as noted in the HPI. All other systems reviewed and are negative.   Past Medical History:  Diagnosis Date   Anxiety    Arthritis    knees   Cancer ([place]) 11/2016   Left breast cancer   Depression    Diabetes mellitus without complication ([place])    Diverticulitis    Diverticulosis    bleeding   GERD (gastroesophageal reflux disease)    Hypertension    Hyperthyroidism    nodule on thyroid, Radioactive, now hypo   Hypothyroidism, iatrogenic    After RI now hypo on synthroid   Personal history of  radiation therapy    Past Surgical History:  Procedure Laterality Date   ABDOMINAL HYSTERECTOMY  1977   BLADDER SURGERY     bladder suspension   BREAST EXCISIONAL BIOPSY Right    BREAST LUMPECTOMY Left 2018   BREAST LUMPECTOMY WITH RADIOACTIVE SEED AND SENTINEL LYMPH NODE BIOPSY Left 12/11/2016   Procedure: BREAST LUMPECTOMY WITH RADIOACTIVE SEED AND SENTINEL LYMPH NODE BIOPSY;  Surgeon: Rolm Bookbinder, MD;  Location: [facility];  Service: General;  Laterality: Left;   CHOLECYSTECTOMY N/A 03/29/2013   Procedure: LAPAROSCOPIC CHOLECYSTECTOMY WITH INTRAOPERATIVE CHOLANGIOGRAM;  Surgeon: Edward Jolly, MD;  Location: [facility] [place];  Service: General;  Laterality: N/A;   JOINT REPLACEMENT Right 2009   total knee replacement   ROTATOR CUFF REPAIR Left    TOTAL KNEE REVISION  12/17/2010   Procedure: TOTAL KNEE REVISION;  Surgeon: Dione Plover Aluisio;  [facility];  Service: Orthopedics;  Laterality: Right;    Social History:  reports that she has never smoked. She has never used smokeless tobacco. She reports current alcohol use. She reports that she does not use drugs.   Allergies  Allergen Reactions   Amitriptyline Other (See Comments)    hallucinations   Aspirin Other (See Comments)    Bleeding   Nsaids Other (See Comments)    Lower GI bleeding   Meperidine Hcl Other (See Comments)    Causes migraines Other reaction(s): migraine   Morphine And Related Rash   Morphine  Sulfate Rash    Other reaction(s): rash    Family History  Problem Relation Age of Onset   Diverticulitis Mother    Alzheimer's disease Mother    Colon cancer Maternal Grandmother    Heart attack Father    Breast cancer Daughter       Prior to Admission medications   Medication Sig Start Date End Date Taking? Authorizing Provider  acetaminophen (TYLENOL) 325 MG tablet Take 1-2 tablets (325-650 mg total) by mouth every 4 (four) hours as needed for mild pain. 04/02/20   Love, Ivan Anchors, PA-C   amLODipine (NORVASC) 10 MG tablet Take 10 mg by mouth daily. 02/03/20   [provider]  anastrozole (ARIMIDEX) 1 MG tablet Take 1 tablet (1 mg total) by mouth daily. 07/17/20   Magrinat, Virgie Dad, MD  Azithromycin (ZITHROMAX Z-PAK PO) Take by mouth.    [provider]  Biotin 1 MG CAPS Take 1 mg by mouth daily.    [provider]  Cholecalciferol (VITAMIN D-3) 5000 units TABS Take 5,000 Units by mouth daily. 06/14/17   Magrinat, Virgie Dad, MD  diphenhydrAMINE (BENADRYL) 25 mg capsule Take 1 capsule (25 mg total) by mouth every 6 (six) hours as needed for itching. 03/22/20   Viona Gilmore D, NP  enalapril (VASOTEC) 10 MG tablet Take 10 mg daily by mouth.    [provider]  escitalopram (LEXAPRO) 10 MG tablet Take 10 mg daily by mouth.    [provider]  glimepiride (AMARYL) 1 MG tablet Take 1 mg by mouth 2 (two) times daily.    [provider]  HYDROcodone-acetaminophen (NORCO/VICODIN) 5-325 MG tablet Take 1 tablet by mouth every 6 (six) hours as needed for moderate pain or severe pain. 04/02/20   Love, Ivan Anchors, PA-C  Levothyroxine Sodium 100 MCG CAPS Take 100 mcg daily by mouth.  11/21/15   [provider]  metoprolol succinate (TOPROL-XL) 50 MG 24 hr tablet Take 50 mg by mouth daily. 03/04/20   [provider]  potassium chloride SA (KLOR-CON) 20 MEQ tablet Take 40 mEq by mouth 3 (three) times daily. 02/03/20   [provider]  PREDNISONE, PAK, PO Take by mouth.    [provider]  rosuvastatin (CRESTOR) 5 MG tablet Take 5 mg by mouth daily. 12/04/15   [provider]  traZODone (DESYREL) 50 MG tablet Take 0.5-1 tablets (25-50 mg total) by mouth at bedtime as needed for sleep. 04/02/20   Bary Leriche, PA-C    Physical Exam: BP (!) 101/50   Pulse 84   Temp 98 F (36.7 C) (Oral)   Resp (!) 22   Ht '5\' 2"'$  (1.575 m)   Wt 80.3 kg   SpO2 90%   BMI 32.38 kg/m   General: 81 y.o. year-old female well  developed well nourished in no acute distress.  Alert and oriented x3. Cardiovascular: Regular rate and rhythm with no rubs or gallops.  No thyromegaly or JVD noted.  No lower extremity edema. 2/4 pulses in all 4 extremities. Respiratory: Clear to auscultation with no wheezes or rales. Good inspiratory effort. Abdomen: Soft nontender nondistended with normal bowel sounds x4 quadrants. Muskuloskeletal: No cyanosis, clubbing or edema noted bilaterally Neuro: CN II-XII intact, strength, sensation, reflexes Skin: No ulcerative lesions noted or rashes Psychiatry: Judgement and insight appear normal. Mood is appropriate for condition and setting          Labs on Admission:  Basic Metabolic Panel: Recent Labs  Lab 01/08/22  1832  NA 134*  K 2.9*  CL 101  CO2 18*  GLUCOSE 206*  BUN 30*  CREATININE 2.42*  CALCIUM 8.8*   Liver Function Tests: No results for input(s): "AST", "ALT", "ALKPHOS", "BILITOT", "PROT", "ALBUMIN" in the last 168 hours. No results for input(s): "LIPASE", "AMYLASE" in the last 168 hours. No results for input(s): "AMMONIA" in the last 168 hours. CBC: Recent Labs  Lab 01/08/22 1832  WBC 9.5  NEUTROABS 6.6  HGB 12.2  HCT 38.4  MCV 84.4  PLT 219   Cardiac Enzymes: No results for input(s): "CKTOTAL", "CKMB", "CKMBINDEX", "TROPONINI" in the last 168 hours.  BNP (last 3 results) Recent Labs    01/08/22 1822  BNP 687.9*    ProBNP (last 3 results) No results for input(s): "PROBNP" in the last 8760 hours.  CBG: No results for input(s): "GLUCAP" in the last 168 hours.  Radiological Exams on Admission: DG Chest 2 View  Result Date: 01/08/2022 CLINICAL DATA:  Difficulty in breathing. Shortness of breath for 10 days. EXAM: CHEST - 2 VIEW COMPARISON:  03/19/2020 FINDINGS: Normal heart size and pulmonary vascularity. No focal airspace disease or consolidation in the lungs. No blunting of costophrenic angles. No pneumothorax. Mediastinal contours appear intact.  Degenerative changes in the spine. IMPRESSION: No active cardiopulmonary disease. Electronically Signed   By: Lucienne Capers M.D.   On: 01/08/2022 19:10    EKG: I independently viewed the EKG done and my findings are as followed: Normal sinus rhythm rate of 83, nonspecific ST-T changes.  QTc 501.  Assessment/Plan Present on Admission: **None**  Principal Problem:   Acute CHF (congestive heart failure) (HCC)  Acute CHF, unspecified Dyspnea with minimal exertion, BNP greater than 700 Follow 2D echo Start strict I's and O's and daily weight Consider cardiology evaluation.  Type 2 diabetes with hyperglycemia Hold off home oral hypoglycemic Obtain hemoglobin A1c Start insulin sliding scale.  Breast cancer Follows with Dr. Jana Hakim Resume home anastrozole  Hyperlipidemia Resume home Crestor  Hypothyroidism Resume home levothyroxine  Chronic anxiety/depression Resume home  Lexapro    DVT prophylaxis: Subcu Lovenox daily  Code Status: DNR  Family Communication: None at bedside  Disposition Plan: Admitted to telemetry unit  Consults called: None.  Admission status: Inpatient status.   Status is: Inpatient The patient requires at least 2 midnights for further evaluation and treatment of present condition.   Kayleen Memos MD [facility] Pager [phone]  If 7PM-7AM, please contact night-coverage [email or web address] Password [email or web address]  01/09/2022, 6:30 AM

## 2022-01-09 ENCOUNTER — Inpatient Hospital Stay (HOSPITAL_COMMUNITY): Payer: PPO

## 2022-01-09 DIAGNOSIS — N179 Acute kidney failure, unspecified: Secondary | ICD-10-CM

## 2022-01-09 DIAGNOSIS — R0609 Other forms of dyspnea: Secondary | ICD-10-CM | POA: Diagnosis not present

## 2022-01-09 DIAGNOSIS — I5031 Acute diastolic (congestive) heart failure: Secondary | ICD-10-CM

## 2022-01-09 LAB — GLUCOSE, CAPILLARY
Glucose-Capillary: 200 mg/dL — ABNORMAL HIGH (ref 70–99)
Glucose-Capillary: 166 mg/dL — ABNORMAL HIGH (ref 70–99)
Glucose-Capillary: 155 mg/dL — ABNORMAL HIGH (ref 70–99)
Glucose-Capillary: 127 mg/dL — ABNORMAL HIGH (ref 70–99)
Glucose-Capillary: 139 mg/dL — ABNORMAL HIGH (ref 70–99)

## 2022-01-09 LAB — ECHOCARDIOGRAM COMPLETE
Area-P 1/2: 4.17 cm2
Calc EF: 72.9 %
Height: 62 in
S' Lateral: 2.2 cm
Single Plane A2C EF: 77 %
Single Plane A4C EF: 72.2 %
Weight: 2832.47 oz

## 2022-01-09 LAB — BASIC METABOLIC PANEL
Anion gap: 10 (ref 5–15)
BUN: 23 mg/dL (ref 8–23)
CO2: 20 mmol/L — ABNORMAL LOW (ref 22–32)
Calcium: 9 mg/dL (ref 8.9–10.3)
Chloride: 108 mmol/L (ref 98–111)
Creatinine, Ser: 1.77 mg/dL — ABNORMAL HIGH (ref 0.44–1.00)
GFR, Estimated: 29 mL/min — ABNORMAL LOW (ref >60)
Glucose, Bld: 123 mg/dL — ABNORMAL HIGH (ref 70–99)
Potassium: 3.8 mmol/L (ref 3.5–5.1)
Sodium: 138 mmol/L (ref 135–145)

## 2022-01-09 MED ORDER — TRAMADOL HCL 50 MG PO TABS: 50.0000 mg | ORAL_TABLET | Freq: Two times a day (BID) | ORAL | Status: DC | PRN

## 2022-01-09 MED ORDER — ROSUVASTATIN CALCIUM 5 MG PO TABS: 5.0000 mg | ORAL_TABLET | Freq: Every day | ORAL | Status: DC

## 2022-01-09 MED ORDER — ENOXAPARIN SODIUM 30 MG/0.3ML IJ SOSY
30.0000 mg | PREFILLED_SYRINGE | Freq: Every day | INTRAMUSCULAR | Status: DC
  Administered 2022-01-09: 30 mg via SUBCUTANEOUS
  Filled 2022-01-09: qty 0.3

## 2022-01-09 MED ORDER — PROCHLORPERAZINE EDISYLATE 10 MG/2ML IJ SOLN
5.0000 mg | Freq: Four times a day (QID) | INTRAMUSCULAR | Status: DC | PRN
  Administered 2022-01-10: 5 mg via INTRAVENOUS
  Filled 2022-01-09: qty 2

## 2022-01-09 MED ORDER — ANASTROZOLE 1 MG PO TABS: 1.0000 mg | ORAL_TABLET | Freq: Every day | ORAL | Status: DC

## 2022-01-09 MED ORDER — PERFLUTREN LIPID MICROSPHERE
1.0000 mL | INTRAVENOUS | Status: AC | PRN
  Administered 2022-01-09: 3 mL via INTRAVENOUS

## 2022-01-09 MED ORDER — HEPARIN BOLUS VIA INFUSION
4700.0000 [IU] | Freq: Once | INTRAVENOUS | Status: AC
  Administered 2022-01-09: 4700 [IU] via INTRAVENOUS
  Filled 2022-01-09: qty 4700

## 2022-01-09 MED ORDER — ESCITALOPRAM OXALATE 10 MG PO TABS
10.0000 mg | ORAL_TABLET | Freq: Every day | ORAL | Status: DC
  Administered 2022-01-09 – 2022-01-12 (×4): 10 mg via ORAL
  Filled 2022-01-09 (×4): qty 1

## 2022-01-09 MED ORDER — TRAZODONE HCL 50 MG PO TABS
50.0000 mg | ORAL_TABLET | Freq: Every evening | ORAL | Status: DC | PRN
  Administered 2022-01-09 – 2022-01-11 (×3): 50 mg via ORAL
  Filled 2022-01-09 (×3): qty 1

## 2022-01-09 MED ORDER — LEVOTHYROXINE SODIUM 100 MCG PO TABS
100.0000 ug | ORAL_TABLET | Freq: Every day | ORAL | Status: DC
  Administered 2022-01-09 – 2022-01-12 (×4): 100 ug via ORAL
  Filled 2022-01-09 (×4): qty 1

## 2022-01-09 MED ORDER — INSULIN ASPART 100 UNIT/ML IJ SOLN: 0.0000 [IU] | Freq: Every day | INTRAMUSCULAR | Status: DC

## 2022-01-09 MED ORDER — HEPARIN (PORCINE) 25000 UT/250ML-% IV SOLN
950.0000 [IU]/h | INTRAVENOUS | Status: DC
  Administered 2022-01-09: 1150 [IU]/h via INTRAVENOUS
  Administered 2022-01-10: 900 [IU]/h via INTRAVENOUS
  Administered 2022-01-11: 950 [IU]/h via INTRAVENOUS
  Filled 2022-01-09 (×3): qty 250

## 2022-01-09 MED ORDER — INSULIN ASPART 100 UNIT/ML IJ SOLN
0.0000 [IU] | Freq: Three times a day (TID) | INTRAMUSCULAR | Status: DC
  Administered 2022-01-10: 2 [IU] via SUBCUTANEOUS
  Administered 2022-01-10: 3 [IU] via SUBCUTANEOUS
  Administered 2022-01-11: 2 [IU] via SUBCUTANEOUS
  Administered 2022-01-11: 1 [IU] via SUBCUTANEOUS
  Administered 2022-01-11: 2 [IU] via SUBCUTANEOUS
  Administered 2022-01-12 (×2): 1 [IU] via SUBCUTANEOUS
  Administered 2022-01-12: 2 [IU] via SUBCUTANEOUS

## 2022-01-09 MED ORDER — ACETAMINOPHEN 325 MG PO TABS
650.0000 mg | ORAL_TABLET | Freq: Four times a day (QID) | ORAL | Status: DC | PRN
  Filled 2022-01-09: qty 2

## 2022-01-09 MED ORDER — POTASSIUM CHLORIDE CRYS ER 20 MEQ PO TBCR
40.0000 meq | EXTENDED_RELEASE_TABLET | Freq: Once | ORAL | Status: AC
  Administered 2022-01-09: 40 meq via ORAL
  Filled 2022-01-09: qty 2

## 2022-01-09 NOTE — Progress Notes (Signed)
Echocardiogram 2D Echocardiogram has been performed.  [name] 01/09/2022, 3:35 PM

## 2022-01-09 NOTE — Progress Notes (Signed)
Mobility Specialist - Progress Note  During mobility: 117 bpm HR, 90% SpO2 Post-mobility: 105 bpm HR, 87% SPO2   01/09/22 1553  Oxygen Therapy  O2 Device Room Air  Mobility  Activity Ambulated with assistance in hallway  Level of Assistance Contact guard assist, steadying assist  Assistive Device Other (Comment) (Hallway Rail ; HHA)  Distance Ambulated (ft) 45 ft  Range of Motion/Exercises Active  Activity Response Tolerated well  Mobility Referral Yes  $Mobility charge 1 Mobility   Pt was found in bed and agreeable to ambulate. During ambulation stated feeling a little lightheaded and SOB. SPO2 was checked to be 90%  and took x1 brief standing rest break to encourage pursed lipped breathing. At EOS returned to bed with necessities in reach and family in room. RN was notified of session.  Ferd Hibbs Mobility Specialist

## 2022-01-09 NOTE — Progress Notes (Addendum)
PROGRESS NOTE    [name]  UDJ:[id number]  DOB: [birth year]  DOA: 01/08/2022 PCP: [name], MD Outpatient Specialists:   Hospital course:  81 year old female with HTN, DM 2, breast CVA followed by Dr. Jana Hakim was admitted yesterday for dyspnea.  Patient had recently been treated for pneumonia 2 weeks ago and had improved with antibiotics however symptoms recurred 3 to 4 days ago.  Workup in ED was suggestive of decompensated heart failure with elevated BNP of 700.  Chest x-ray however with no change in interstitial markings.   Subjective:  Patient states that she she feels better and would really like to go home, but is patiently waiting for echocardiogram.  Patient states she was able to walk to the bathroom, admits to being tired and may be short of breath different from baseline.  Usually patient is able to do her own grocery shopping and get out of the house without difficulty.   Objective: Vitals:   01/09/22 0119 01/09/22 0500 01/09/22 0909 01/09/22 1311  BP: (!) 128/57 (!) 101/50 120/62 113/61  Pulse: 88 84 87 90  Resp:   19 (!) 22  Temp: (!) 97.5 F (36.4 C) 98 F (36.7 C) 98.7 F (37.1 C) 98.9 F (37.2 C)  TempSrc: Oral Oral Oral Oral  SpO2: 99% 90% 91% 92%  Weight: 80.3 kg     Height: '5\' 2"'$  (1.575 m)       Intake/Output Summary (Last 24 hours) at 01/09/2022 1429 Last data filed at 01/09/2022 0900 Gross per 24 hour  Intake 1100 ml  Output --  Net 1100 ml   Filed Weights   01/09/22 0119  Weight: 80.3 kg     Exam:  General: Patient lying flat in bed in NAD Eyes: sclera anicteric, conjuctiva mild injection bilaterally CVS: S1-S2, regular  Respiratory:  decreased air entry bilaterally secondary to decreased inspiratory effort, rales at bases  GI: NABS, soft, NT  LE: Warm and well-perfused Neuro: A/O x 3,  grossly nonfocal.  Psych: patient is logical and coherent, judgement and insight appear normal, mood and affect appropriate to  situation.  Data Reviewed:  Basic Metabolic Panel: Recent Labs  Lab 01/08/22 1832  NA 134*  K 2.9*  CL 101  CO2 18*  GLUCOSE 206*  BUN 30*  CREATININE 2.42*  CALCIUM 8.8*    CBC: Recent Labs  Lab 01/08/22 1832  WBC 9.5  NEUTROABS 6.6  HGB 12.2  HCT 38.4  MCV 84.4  PLT 219     Scheduled Meds:  enoxaparin (LOVENOX) injection  30 mg Subcutaneous Daily   escitalopram  10 mg Oral Daily   insulin aspart  0-5 Units Subcutaneous QHS   insulin aspart  0-9 Units Subcutaneous TID WC   levothyroxine  100 mcg Oral Daily   Continuous Infusions:   Assessment & Plan:   DOE--concern for PE Only significant finding is elevated BNP with no acute changes on chest x-ray Will order noncontrast chest CT, AKI precludes contrast If noncontrast CT is negative, consider VQ scan In the meanwhile echocardiogram is ordered and pending--per cardiology, ECHO shows normal LV function but decreased RV function--possibly suggestive of acute PE. Will order VQ scan and Duplex dopplers and start empiric heparin drip pending VQ results.  Elevated BNP Patient with AKI and normal blood pressures off all of her usual antihypertensives, unclear etiology possible new onset cardiac systolic dysfunction. Today patient was seen lying flat with no evidence of orthopnea.,  Chest x-ray negative Echocardiogram is ordered and  pending, patient may need cardiac cath if abnormal echo Troponins done last night are not elevated  HTN Blood pressures are normal off all her multiple BP medications Hold home doses of Toprol-XL, HCTZ, amlodipine and enalapril  AKI Unclear etiology, will order urine electrolytes for Fena, patient has not yet gotten any Lasix We will also order renal ultrasound  Hypokalemia Will replete and recheck  Breast cancer Arimidex was restarted last night however somehow it has been discontinued Will need to clarify with oncologist whether patient is supposed to be on Arimidex  DM 2  with hyperglycemia Agree with SSI given new kidney injury Blood sugars under reasonable control on present regimen  Anxiety and depression Continue Lexapro  Hypothyroidism Continue Synthroid      DVT prophylaxis: Lovenox Code Status: DNR Family Communication: Husband was at bedside throughout   Studies: DG Chest 2 View  Result Date: 01/08/2022 CLINICAL DATA:  Difficulty in breathing. Shortness of breath for 10 days. EXAM: CHEST - 2 VIEW COMPARISON:  03/19/2020 FINDINGS: Normal heart size and pulmonary vascularity. No focal airspace disease or consolidation in the lungs. No blunting of costophrenic angles. No pneumothorax. Mediastinal contours appear intact. Degenerative changes in the spine. IMPRESSION: No active cardiopulmonary disease. Electronically Signed   By: Lucienne Capers M.D.   On: 01/08/2022 19:10    Principal Problem:   Acute CHF (congestive heart failure) ([place])     [name] Derek Jack, [facility]  If 7PM-7AM, please contact night-coverage [email or web address]   LOS: 1 day

## 2022-01-09 NOTE — Progress Notes (Signed)
  Transition of Care [facility]) Screening Note   Patient Details  Name: [name] Date of Birth: May 12, 1940   Transition of Care [facility]) CM/SW Contact:    Henrietta Dine, RN Phone Number: [phone] 01/09/2022, 11:55 AM    Transition of Care Department [facility]) has reviewed patient and no TOC needs have been identified at this time. We will continue to monitor patient advancement through interdisciplinary progression rounds. If new patient transition needs arise, please place a TOC consult.

## 2022-01-09 NOTE — Progress Notes (Signed)
ANTICOAGULATION CONSULT NOTE - Initial Consult  Pharmacy Consult for IV heparin Indication: pulmonary embolus  Allergies  Allergen Reactions   Amitriptyline Other (See Comments)    hallucinations   Aspirin Other (See Comments)    Bleeding   Nsaids Other (See Comments)    Lower GI bleeding   Meperidine Hcl Other (See Comments)    Causes migraines Other reaction(s): migraine   Morphine And Related Rash   Morphine Sulfate Rash    Other reaction(s): rash    Patient Measurements: Height: '5\' 2"'$  (157.5 cm) Weight: 80.3 kg (177 lb 0.5 oz) IBW/kg (Calculated) : 50.1 Heparin Dosing Weight: 68 kg   Vital Signs: Temp: 98.9 F (37.2 C) (12/15 1311) Temp Source: Oral (12/15 1311) BP: 113/61 (12/15 1311) Pulse Rate: 90 (12/15 1311)  Labs: Recent Labs    01/08/22 1832 01/08/22 2044 01/09/22 1555  HGB 12.2  --   --   HCT 38.4  --   --   PLT 219  --   --   CREATININE 2.42*  --  1.77*  TROPONINIHS 20* 15  --     Estimated Creatinine Clearance: 25.3 mL/min (A) (by C-G formula based on SCr of 1.77 mg/dL (H)).   Medical History: Past Medical History:  Diagnosis Date   Anxiety    Arthritis    knees   Cancer ([place]) 11/2016   Left breast cancer   Depression    Diabetes mellitus without complication ([place])    Diverticulitis    Diverticulosis    bleeding   GERD (gastroesophageal reflux disease)    Hypertension    Hyperthyroidism    nodule on thyroid, Radioactive, now hypo   Hypothyroidism, iatrogenic    After RI now hypo on synthroid   Personal history of radiation therapy     Medications:  Medications Prior to Admission  Medication Sig Dispense Refill Last Dose   acetaminophen (TYLENOL) 325 MG tablet Take 1-2 tablets (325-650 mg total) by mouth every 4 (four) hours as needed for mild pain.   unk   amLODipine (NORVASC) 10 MG tablet Take 10 mg by mouth daily.   01/07/2022   dapagliflozin propanediol (FARXIGA) 5 MG TABS tablet Take 5 mg by mouth daily.   01/07/2022    diphenhydrAMINE (BENADRYL) 25 mg capsule Take 1 capsule (25 mg total) by mouth every 6 (six) hours as needed for itching. 30 capsule 0 unk   enalapril (VASOTEC) 10 MG tablet Take 10 mg daily by mouth.   01/07/2022   escitalopram (LEXAPRO) 10 MG tablet Take 10 mg daily by mouth.   01/07/2022   glimepiride (AMARYL) 2 MG tablet Take 2 mg by mouth 2 (two) times daily.   01/07/2022   hydrochlorothiazide (HYDRODIURIL) 25 MG tablet Take 25 mg by mouth every morning.   01/07/2022   Levothyroxine Sodium 100 MCG CAPS Take 100 mcg daily by mouth.   1 01/07/2022   metoprolol succinate (TOPROL-XL) 50 MG 24 hr tablet Take 50 mg by mouth daily.   01/07/2022 at 1200   potassium chloride SA (KLOR-CON) 20 MEQ tablet Take 40 mEq by mouth 3 (three) times daily.   01/07/2022   promethazine-dextromethorphan (PROMETHAZINE-DM) 6.25-15 MG/5ML syrup Take 5 mLs by mouth every 6 (six) hours as needed for cough.   Past Week   traMADol (ULTRAM) 50 MG tablet Take 50 mg by mouth every 8 (eight) hours as needed for moderate pain.   01/07/2022   traZODone (DESYREL) 50 MG tablet Take 0.5-1 tablets (25-50 mg total) by  mouth at bedtime as needed for sleep. (Patient taking differently: Take 50 mg by mouth at bedtime as needed for sleep.) 10 tablet 0 01/07/2022   valsartan (DIOVAN) 160 MG tablet Take 160 mg by mouth daily.   01/07/2022   amoxicillin-clavulanate (AUGMENTIN) 875-125 MG tablet Take 1 tablet by mouth 2 (two) times daily. Completed on 01-07-22      anastrozole (ARIMIDEX) 1 MG tablet Take 1 tablet (1 mg total) by mouth daily. (Patient not taking: Reported on 01/09/2022) 90 tablet 3 Not Taking   rosuvastatin (CRESTOR) 5 MG tablet Take 5 mg by mouth daily. (Patient not taking: Reported on 01/09/2022)  0 Not Taking    Assessment: Pharmacy is consulted to start heparin drip in 81 yo female with possible PE. Per [facility] note, cardiology indicates ECHO shows normal LV function but decreased RV function, possibly suggestive of acute PE.   VQ scan ordered. Not on anticoagulation PTA. Pt received dose on enoxaparin 30 mg SQ at 2542 on 12/15.   Today, 01/09/22  Hgb 12.2, plt 219  SCr 1.77 mg/dl, CrCl 25 ml/min    Goal of Therapy:  Heparin level 0.3-0.7 units/ml Monitor platelets by anticoagulation protocol: Yes   Plan:  Heparin 4700 unit bolus followed by heparin 1150 units/hr  Obtain HL 8 hours after start of infusion  Daily CBC while on heparin drip  Follow up results of imaging Monitor for signs and symptoms of bleeding   Royetta Asal, PharmD, BCPS 01/09/2022 6:11 PM

## 2022-01-10 ENCOUNTER — Inpatient Hospital Stay (HOSPITAL_COMMUNITY): Payer: PPO

## 2022-01-10 ENCOUNTER — Encounter (HOSPITAL_COMMUNITY): Payer: Self-pay | Admitting: Internal Medicine

## 2022-01-10 DIAGNOSIS — R0689 Other abnormalities of breathing: Secondary | ICD-10-CM | POA: Diagnosis not present

## 2022-01-10 DIAGNOSIS — I50811 Acute right heart failure: Secondary | ICD-10-CM

## 2022-01-10 DIAGNOSIS — I272 Pulmonary hypertension, unspecified: Secondary | ICD-10-CM | POA: Diagnosis not present

## 2022-01-10 DIAGNOSIS — I82453 Acute embolism and thrombosis of peroneal vein, bilateral: Secondary | ICD-10-CM | POA: Diagnosis not present

## 2022-01-10 LAB — COMPREHENSIVE METABOLIC PANEL
ALT: 17 U/L (ref 0–44)
AST: 15 U/L (ref 15–41)
Albumin: 2.7 g/dL — ABNORMAL LOW (ref 3.5–5.0)
Alkaline Phosphatase: 70 U/L (ref 38–126)
Anion gap: 10 (ref 5–15)
BUN: 19 mg/dL (ref 8–23)
CO2: 21 mmol/L — ABNORMAL LOW (ref 22–32)
Calcium: 8.5 mg/dL — ABNORMAL LOW (ref 8.9–10.3)
Chloride: 107 mmol/L (ref 98–111)
Creatinine, Ser: 1.82 mg/dL — ABNORMAL HIGH (ref 0.44–1.00)
GFR, Estimated: 28 mL/min — ABNORMAL LOW (ref >60)
Glucose, Bld: 129 mg/dL — ABNORMAL HIGH (ref 70–99)
Potassium: 3.4 mmol/L — ABNORMAL LOW (ref 3.5–5.1)
Sodium: 138 mmol/L (ref 135–145)
Total Bilirubin: 0.6 mg/dL (ref 0.3–1.2)
Total Protein: 5.8 g/dL — ABNORMAL LOW (ref 6.5–8.1)

## 2022-01-10 LAB — CBC
HCT: 31.8 % — ABNORMAL LOW (ref 36.0–46.0)
Hemoglobin: 9.9 g/dL — ABNORMAL LOW (ref 12.0–15.0)
MCH: 26.5 pg (ref 26.0–34.0)
MCHC: 31.1 g/dL (ref 30.0–36.0)
MCV: 85 fL (ref 80.0–100.0)
Platelets: 224 10*3/uL (ref 150–400)
RBC: 3.74 MIL/uL — ABNORMAL LOW (ref 3.87–5.11)
RDW: 15 % (ref 11.5–15.5)
WBC: 7.4 10*3/uL (ref 4.0–10.5)
nRBC: 0 % (ref 0.0–0.2)

## 2022-01-10 LAB — CREATININE, URINE, RANDOM: Creatinine, Urine: 100 mg/dL

## 2022-01-10 LAB — HEMOGLOBIN A1C
Hgb A1c MFr Bld: 9 % — ABNORMAL HIGH (ref 4.8–5.6)
Mean Plasma Glucose: 212 mg/dL

## 2022-01-10 LAB — HEPARIN LEVEL (UNFRACTIONATED)
Heparin Unfractionated: 0.86 IU/mL — ABNORMAL HIGH (ref 0.30–0.70)
Heparin Unfractionated: 0.43 IU/mL (ref 0.30–0.70)
Heparin Unfractionated: 0.38 IU/mL (ref 0.30–0.70)

## 2022-01-10 LAB — GLUCOSE, CAPILLARY
Glucose-Capillary: 178 mg/dL — ABNORMAL HIGH (ref 70–99)
Glucose-Capillary: 223 mg/dL — ABNORMAL HIGH (ref 70–99)
Glucose-Capillary: 208 mg/dL — ABNORMAL HIGH (ref 70–99)
Glucose-Capillary: 156 mg/dL — ABNORMAL HIGH (ref 70–99)

## 2022-01-10 LAB — SODIUM, URINE, RANDOM: Sodium, Ur: 33 mmol/L

## 2022-01-10 MED ORDER — POTASSIUM CHLORIDE CRYS ER 20 MEQ PO TBCR
20.0000 meq | EXTENDED_RELEASE_TABLET | Freq: Once | ORAL | Status: AC
  Administered 2022-01-10: 20 meq via ORAL
  Filled 2022-01-10: qty 1

## 2022-01-10 NOTE — Progress Notes (Signed)
ANTICOAGULATION CONSULT NOTE - Follow Up Consult  Pharmacy Consult for Heparin Indication: DVT, r/o PE  Allergies  Allergen Reactions   Amitriptyline Other (See Comments)    hallucinations   Aspirin Other (See Comments)    Bleeding   Nsaids Other (See Comments)    Lower GI bleeding   Meperidine Hcl Other (See Comments)    Causes migraines Other reaction(s): migraine   Morphine And Related Rash   Morphine Sulfate Rash    Other reaction(s): rash    Patient Measurements: Height: '5\' 2"'$  (157.5 cm) Weight: 78.8 kg (173 lb 11.6 oz) IBW/kg (Calculated) : 50.1 Heparin Dosing Weight: 68kg  Vital Signs: Temp: 98.4 F (36.9 C) (12/16 0405) Temp Source: Oral (12/16 0405) BP: 134/66 (12/16 0405) Pulse Rate: 90 (12/16 0405)  Labs: Recent Labs    01/08/22 1832 01/08/22 2044 01/09/22 1555 01/10/22 0453  HGB 12.2  --   --  9.9*  HCT 38.4  --   --  31.8*  PLT 219  --   --  224  HEPARINUNFRC  --   --   --  0.86*  CREATININE 2.42*  --  1.77* 1.82*  TROPONINIHS 20* 15  --   --     Estimated Creatinine Clearance: 24.4 mL/min (A) (by C-G formula based on SCr of 1.82 mg/dL (H)).   Medications:  Infusions:   heparin 900 Units/hr (01/10/22 7416)    Assessment: 56 yoF presented on 12/14 with dyspnea s/p pneumonia treatment about 2 weeks ago.  ECHO shows normal LV function but decreased RV function, possibly suggestive of acute PE. VQ scan ordered. Not on anticoagulation PTA.  Dopplers positive for bilateral DVT.  Pharmacy is consulted to dose Heparin IV.   Today, 01/10/2022: Heparin level 0.43, therapeutic on heparin 900 units/hr CBC: Hgb down to 9.9, Plt 224 No bleeding or complications reported.   SCr 1.82 VQ Scan still ordered, not yet completed.     Goal of Therapy:  Heparin level 0.3-0.7 units/ml Monitor platelets by anticoagulation protocol: Yes   Plan:  Continue heparin IV infusion at 900 units/hr Confirmatory heparin level in 8 hours Daily heparin level and  CBC   Gretta Arab PharmD, BCPS [facility] main pharmacy [phone] 01/10/2022 1:15 PM

## 2022-01-10 NOTE — Progress Notes (Signed)
ANTICOAGULATION CONSULT NOTE  Pharmacy Consult for IV heparin Indication: pulmonary embolus  Allergies  Allergen Reactions   Amitriptyline Other (See Comments)    hallucinations   Aspirin Other (See Comments)    Bleeding   Nsaids Other (See Comments)    Lower GI bleeding   Meperidine Hcl Other (See Comments)    Causes migraines Other reaction(s): migraine   Morphine And Related Rash   Morphine Sulfate Rash    Other reaction(s): rash    Patient Measurements: Height: '5\' 2"'$  (157.5 cm) Weight: 78.8 kg (173 lb 11.6 oz) IBW/kg (Calculated) : 50.1 Heparin Dosing Weight: 68 kg   Vital Signs: Temp: 98.4 F (36.9 C) (12/16 0405) Temp Source: Oral (12/16 0405) BP: 134/66 (12/16 0405) Pulse Rate: 90 (12/16 0405)  Labs: Recent Labs    01/08/22 1832 01/08/22 2044 01/09/22 1555 01/10/22 0453  HGB 12.2  --   --  9.9*  HCT 38.4  --   --  31.8*  PLT 219  --   --  224  HEPARINUNFRC  --   --   --  0.86*  CREATININE 2.42*  --  1.77*  --   TROPONINIHS 20* 15  --   --      Estimated Creatinine Clearance: 25.1 mL/min (A) (by C-G formula based on SCr of 1.77 mg/dL (H)).   Medications:  Medications Prior to Admission  Medication Sig Dispense Refill Last Dose   acetaminophen (TYLENOL) 325 MG tablet Take 1-2 tablets (325-650 mg total) by mouth every 4 (four) hours as needed for mild pain.   unk   amLODipine (NORVASC) 10 MG tablet Take 10 mg by mouth daily.   01/07/2022   dapagliflozin propanediol (FARXIGA) 5 MG TABS tablet Take 5 mg by mouth daily.   01/07/2022   diphenhydrAMINE (BENADRYL) 25 mg capsule Take 1 capsule (25 mg total) by mouth every 6 (six) hours as needed for itching. 30 capsule 0 unk   enalapril (VASOTEC) 10 MG tablet Take 10 mg daily by mouth.   01/07/2022   escitalopram (LEXAPRO) 10 MG tablet Take 10 mg daily by mouth.   01/07/2022   glimepiride (AMARYL) 2 MG tablet Take 2 mg by mouth 2 (two) times daily.   01/07/2022   hydrochlorothiazide (HYDRODIURIL) 25 MG  tablet Take 25 mg by mouth every morning.   01/07/2022   Levothyroxine Sodium 100 MCG CAPS Take 100 mcg daily by mouth.   1 01/07/2022   metoprolol succinate (TOPROL-XL) 50 MG 24 hr tablet Take 50 mg by mouth daily.   01/07/2022 at 1200   potassium chloride SA (KLOR-CON) 20 MEQ tablet Take 40 mEq by mouth 3 (three) times daily.   01/07/2022   promethazine-dextromethorphan (PROMETHAZINE-DM) 6.25-15 MG/5ML syrup Take 5 mLs by mouth every 6 (six) hours as needed for cough.   Past Week   traMADol (ULTRAM) 50 MG tablet Take 50 mg by mouth every 8 (eight) hours as needed for moderate pain.   01/07/2022   traZODone (DESYREL) 50 MG tablet Take 0.5-1 tablets (25-50 mg total) by mouth at bedtime as needed for sleep. (Patient taking differently: Take 50 mg by mouth at bedtime as needed for sleep.) 10 tablet 0 01/07/2022   valsartan (DIOVAN) 160 MG tablet Take 160 mg by mouth daily.   01/07/2022   amoxicillin-clavulanate (AUGMENTIN) 875-125 MG tablet Take 1 tablet by mouth 2 (two) times daily. Completed on 01-07-22      anastrozole (ARIMIDEX) 1 MG tablet Take 1 tablet (1 mg total) by mouth  daily. (Patient not taking: Reported on 01/09/2022) 90 tablet 3 Not Taking   rosuvastatin (CRESTOR) 5 MG tablet Take 5 mg by mouth daily. (Patient not taking: Reported on 01/09/2022)  0 Not Taking    Assessment: Pharmacy is consulted to start heparin drip in 81 yo female with possible PE. Per [facility] note, cardiology indicates ECHO shows normal LV function but decreased RV function, possibly suggestive of acute PE. VQ scan ordered. Not on anticoagulation PTA. Pt received dose on enoxaparin 30 mg SQ at 9604 on 12/15.   Today, 01/10/22  Hgb down 2g from admission; likely dilutional; Plt stable WNL  SCr elevated but improved significantly since admission First heparin level slightly elevated on 1150 units/hr; lab drawn appropriately per RN No bleeding or infusion issues per RN  Goal of Therapy:  Heparin level 0.3-0.7  units/ml Monitor platelets by anticoagulation protocol: Yes   Plan:  Reduce heparin to 900 units/hr Obtain HL 8 hours after rate change Daily CBC while on heparin drip  Follow up results of imaging Monitor for signs and symptoms of bleeding  Reuel Boom, PharmD, BCPS [phone] 01/10/2022, 6:22 AM

## 2022-01-10 NOTE — Plan of Care (Signed)

## 2022-01-10 NOTE — Progress Notes (Signed)
PROGRESS NOTE    [name]  LAG:[id number]  DOB: 1940/08/16  DOA: 01/08/2022 PCP: [name], MD Outpatient Specialists:   Hospital course:  Patient is a 81 year old female with HTN, DM 2, breast CVA followed by Dr. Jana Hakim was admitted yesterday for dyspnea.  Patient had recently been treated for pneumonia 2 weeks ago and had improved with antibiotics however symptoms recurred 3 to 4 days ago.  Workup in ED was suggestive of decompensated heart failure with elevated BNP of 700.  Chest x-ray however with no change in interstitial markings.  01/10/2022: Patient seen alongside patient's husband and nurse.  Available records reviewed.  Doppler ultrasound of the lower extremities revealed bilateral lower extremity DVT that is acute.  VQ scan is pending.  Patient is currently heparin.   Subjective: -Shortness of breath is stable. -No fever or chills -No productive cough.   Objective: Vitals:   01/10/22 0405 01/10/22 0500 01/10/22 0800 01/10/22 1403  BP: 134/66   126/64  Pulse: 90   94  Resp: 20  17 (!) 21  Temp: 98.4 F (36.9 C)   98.7 F (37.1 C)  TempSrc: Oral   Oral  SpO2: 95%   97%  Weight:  78.8 kg    Height:        Intake/Output Summary (Last 24 hours) at 01/10/2022 1522 Last data filed at 01/10/2022 1000 Gross per 24 hour  Intake 577.75 ml  Output 300 ml  Net 277.75 ml    Filed Weights   01/09/22 0119 01/10/22 0500  Weight: 80.3 kg 78.8 kg     Exam:  General: Patient is obese.  Not in any distress.  Patient is awake and alert.   Eyes: Patient is pale.  No jaundice.   CVS: S1-S2, regular  Respiratory: Clear to auscultation.   GI: Abdomen is obese and nontender. Extremities: No leg edema.   Neuro: Awake and alert.  Patient moves all extremities.    Data Reviewed:  Basic Metabolic Panel: Recent Labs  Lab 01/08/22 1832 01/09/22 1555 01/10/22 0453  NA 134* 138 138  K 2.9* 3.8 3.4*  CL 101 108 107  CO2 18* 20* 21*  GLUCOSE 206* 123*  129*  BUN 30* 23 19  CREATININE 2.42* 1.77* 1.82*  CALCIUM 8.8* 9.0 8.5*     CBC: Recent Labs  Lab 01/08/22 1832 01/10/22 0453  WBC 9.5 7.4  NEUTROABS 6.6  --   HGB 12.2 9.9*  HCT 38.4 31.8*  MCV 84.4 85.0  PLT 219 224      Scheduled Meds:  escitalopram  10 mg Oral Daily   insulin aspart  0-5 Units Subcutaneous QHS   insulin aspart  0-9 Units Subcutaneous TID WC   levothyroxine  100 mcg Oral Daily   Continuous Infusions:  heparin 900 Units/hr (01/10/22 1224)     Assessment & Plan:   Shortness of breath:  -Venous Doppler ultrasound of the lower extremities revealed bilateral acute DVT. -VQ scan is pending. -Continue heparin. -CT chest and chest x-ray not suggestive of pneumonia. -Echocardiogram revealed right ventricular strain.  Elevated BNP/right ventricular strain: -In the setting of acute bilateral lower extremity DVT with concerns for pulmonary embolism with right ventricular strain and chronic kidney disease stage IIIb at baseline.   -Shortness of breath is likely secondary to vaso-occlusive disease. -No orthopnea. -CT chest and chest x-ray were nonrevealing.  Moderate pulmonary hypertension: -Likely multifactorial, including, but may not be limited to vaso-occlusive disease.     HTN -Controlled.   -  Goal blood pressure should be less than 130/80 mmHg.   -Will be careful with antihypertensives, considering right ventricular strain.   -Blood pressures are normal off all her multiple BP medications Hold home doses of Toprol-XL, HCTZ, amlodipine and enalapril  AKI on CKD 3B: -Current AKI is likely secondary to hemodynamic changes/possible ATN. -Slight improvement is noted. -Continue to monitor renal function and electrolytes. -Follow-up with nephrology team on discharge. -CKD is likely secondary to combined diabetes mellitus and hypertension.    Hypokalemia Continue to monitor and replete.  Breast cancer -Low threshold to consult  heme/oncology/oncology team.  DM 2 with hyperglycemia Agree with SSI given new kidney injury Blood sugars under reasonable control on present regimen  Anxiety and depression Continue Lexapro  Hypothyroidism Continue Synthroid  DVT prophylaxis: Lovenox Code Status: DNR Family Communication: Husband was at bedside throughout   Studies: VAS [place] LOWER EXTREMITY VENOUS (DVT)  Result Date: 01/10/2022  Lower Venous DVT Study Patient Name:  [name]  Date of Exam:   01/10/2022 Medical Rec #: [id number]        Accession #:    [id number] Date of Birth: [birth year]        Patient Gender: F Patient Age:   89 years Exam Location:  [facility] Procedure:      VAS [place] LOWER EXTREMITY VENOUS (DVT) Referring Phys: Dewaine Oats CHATTERJEE --------------------------------------------------------------------------------  Indications: Dyspnea on exertion.  Comparison Study: Previous exam on 03/24/20 was negative for DVT Performing Technologist: Rogelia Rohrer RVT, RDMS  Examination Guidelines: A complete evaluation includes B-mode imaging, spectral Doppler, color Doppler, and power Doppler as needed of all accessible portions of each vessel. Bilateral testing is considered an integral part of a complete examination. Limited examinations for reoccurring indications may be performed as noted. The reflux portion of the exam is performed with the patient in reverse Trendelenburg.  +---------+---------------+---------+-----------+----------+------------------+ RIGHT    CompressibilityPhasicitySpontaneityPropertiesThrombus Aging     +---------+---------------+---------+-----------+----------+------------------+ CFV      Full           Yes      Yes                                     +---------+---------------+---------+-----------+----------+------------------+ SFJ      Full                                                             +---------+---------------+---------+-----------+----------+------------------+ FV Prox  Full           Yes      Yes                                     +---------+---------------+---------+-----------+----------+------------------+ FV Mid   Full           Yes      Yes                                     +---------+---------------+---------+-----------+----------+------------------+ FV DistalFull           Yes      Yes                                     +---------+---------------+---------+-----------+----------+------------------+  PFV      Full                                                            +---------+---------------+---------+-----------+----------+------------------+ POP      Full           Yes      Yes                                     +---------+---------------+---------+-----------+----------+------------------+ PTV      None           No       No                   Acute - one of                                                           paired             +---------+---------------+---------+-----------+----------+------------------+ PERO     Full                                         Not well                                                                 visualized         +---------+---------------+---------+-----------+----------+------------------+   +---------+---------------+---------+-----------+----------+------------------+ LEFT     CompressibilityPhasicitySpontaneityPropertiesThrombus Aging     +---------+---------------+---------+-----------+----------+------------------+ CFV      Full           Yes      Yes                                     +---------+---------------+---------+-----------+----------+------------------+ SFJ      Full                                                            +---------+---------------+---------+-----------+----------+------------------+ FV Prox  Full            Yes      Yes                                     +---------+---------------+---------+-----------+----------+------------------+ FV Mid   Full           Yes      Yes                                     +---------+---------------+---------+-----------+----------+------------------+  FV DistalFull           Yes      Yes                                     +---------+---------------+---------+-----------+----------+------------------+ PFV      Full                                                            +---------+---------------+---------+-----------+----------+------------------+ POP      Full           Yes      Yes                                     +---------+---------------+---------+-----------+----------+------------------+ PTV      None           No       No                   Acute - one of                                                           paired             +---------+---------------+---------+-----------+----------+------------------+ PERO     None           No       No                   Acute              +---------+---------------+---------+-----------+----------+------------------+    Summary: RIGHT: - Findings consistent with acute deep vein thrombosis involving the one of the right posterior tibial veins. - No cystic structure found in the popliteal fossa.  LEFT: - Findings consistent with acute deep vein thrombosis involving the left peroneal veins, and one of the left posterior tibial veins. - A cystic structure is found in the popliteal fossa (5.7 x 1.1 x 2.3 cm).  *See table(s) above for measurements and observations.    Preliminary    ECHOCARDIOGRAM COMPLETE  Result Date: 01/09/2022    ECHOCARDIOGRAM REPORT   Patient Name:   [name] Date of Exam: 01/09/2022 Medical Rec #:  [id number]       Height:       62.0 in Accession #:    [id number]      Weight:       177.0 lb Date of Birth:  March 02, 1940       BSA:          1.815 m  Patient Age:    73 years        BP:           101/50 mmHg Patient Gender: F               HR:           92 bpm. Exam Location:  Inpatient Procedure: 2D Echo, Cardiac Doppler, Color  Doppler and Intracardiac            Opacification Agent  Results communicated to Dr Jamse Arn at 1656 on 01/09/22. Indications:    CHF-Acute Diastolic Z61.09  History:        Patient has no prior history of Echocardiogram examinations.                 Risk Factors:Hypertension, GERD and Diabetes.  Sonographer:    Bernadene Person RDCS Referring Phys: [id number] [place]  1. Right ventricular systolic function is moderately reduced. The right ventricular size is mildly enlarged. There is moderately elevated pulmonary artery systolic pressure. The estimated right ventricular systolic pressure is 81.1 mmHg. There is free wall hypokinesis with normal motion at apex, consistent with McConnell's sign, which can be seen in PE. Recommend ruling out PE  2. Left ventricular ejection fraction, by estimation, is 70 to 75%. The left ventricle has hyperdynamic function. The left ventricle has no regional wall motion abnormalities. There is mild left ventricular hypertrophy. Left ventricular diastolic parameters are consistent with Grade I diastolic dysfunction (impaired relaxation). There is the interventricular septum is flattened in systole and diastole, consistent with right ventricular pressure and volume overload.  3. The mitral valve is normal in structure. No evidence of mitral valve regurgitation.  4. Tricuspid valve regurgitation is mild to moderate.  5. The aortic valve is tricuspid. Aortic valve regurgitation is not visualized. No aortic stenosis is present.  6. The inferior vena cava is normal in size with greater than 50% respiratory variability, suggesting right atrial pressure of 3 mmHg. FINDINGS  Left Ventricle: Left ventricular ejection fraction, by estimation, is 70 to 75%. The left ventricle has hyperdynamic function.  The left ventricle has no regional wall motion abnormalities. Definity contrast agent was given IV to delineate the left ventricular endocardial borders. The left ventricular internal cavity size was small. There is mild left ventricular hypertrophy. The interventricular septum is flattened in systole and diastole, consistent with right ventricular pressure and volume overload. Left ventricular diastolic parameters are consistent with Grade I diastolic dysfunction (impaired relaxation). Right Ventricle: The right ventricular size is mildly enlarged. Right vetricular wall thickness was not well visualized. Right ventricular systolic function is moderately reduced. There is moderately elevated pulmonary artery systolic pressure. The tricuspid regurgitant velocity is 3.28 m/s, and with an assumed right atrial pressure of 3 mmHg, the estimated right ventricular systolic pressure is 91.4 mmHg. Left Atrium: Left atrial size was normal in size. Right Atrium: Right atrial size was normal in size. Pericardium: There is no evidence of pericardial effusion. Mitral Valve: The mitral valve is normal in structure. No evidence of mitral valve regurgitation. Tricuspid Valve: The tricuspid valve is normal in structure. Tricuspid valve regurgitation is mild to moderate. Aortic Valve: The aortic valve is tricuspid. Aortic valve regurgitation is not visualized. No aortic stenosis is present. Pulmonic Valve: The pulmonic valve was not well visualized. Pulmonic valve regurgitation is trivial. Aorta: The aortic root and ascending aorta are structurally normal, with no evidence of dilitation. Venous: The inferior vena cava is normal in size with greater than 50% respiratory variability, suggesting right atrial pressure of 3 mmHg. IAS/Shunts: The interatrial septum was not well visualized.  LEFT VENTRICLE PLAX 2D LVIDd:         3.60 cm     Diastology LVIDs:         2.20 cm     LV e' medial:    4.88 cm/s LV PW:  1.10 cm     LV E/e'  medial:  9.8 LV IVS:        1.00 cm     LV e' lateral:   5.55 cm/s LVOT diam:     1.90 cm     LV E/e' lateral: 8.6 LV SV:         54 LV SV Index:   30 LVOT Area:     2.84 cm  LV Volumes (MOD) LV vol d, MOD A2C: 38.3 ml LV vol d, MOD A4C: 33.4 ml LV vol s, MOD A2C: 8.8 ml LV vol s, MOD A4C: 9.3 ml LV SV MOD A2C:     29.5 ml LV SV MOD A4C:     33.4 ml LV SV MOD BP:      26.2 ml RIGHT VENTRICLE RV S prime:     9.38 cm/s TAPSE (M-mode): 1.6 cm LEFT ATRIUM             Index        RIGHT ATRIUM           Index LA diam:        2.90 cm 1.60 cm/m   RA Area:     14.40 cm LA Vol (A2C):   43.8 ml 24.13 ml/m  RA Volume:   36.00 ml  19.83 ml/m LA Vol (A4C):   39.0 ml 21.49 ml/m LA Biplane Vol: 44.8 ml 24.68 ml/m  AORTIC VALVE LVOT Vmax:   107.00 cm/s LVOT Vmean:  70.400 cm/s LVOT VTI:    0.191 m  AORTA Ao Root diam: 3.20 cm Ao Asc diam:  3.20 cm MITRAL VALVE               TRICUSPID VALVE MV Area (PHT): 4.17 cm    TR Peak grad:   43.0 mmHg MV Decel Time: 182 msec    TR Vmax:        328.00 cm/s MV E velocity: 48.00 cm/s MV A velocity: 86.80 cm/s  SHUNTS MV E/A ratio:  0.55        Systemic VTI:  0.19 m                            Systemic Diam: 1.90 cm Oswaldo Milian MD Electronically signed by Oswaldo Milian MD Signature Date/Time: 01/09/2022/5:01:58 PM    Final    US RENAL  Result Date: 01/09/2022 CLINICAL DATA:  Acute kidney injury EXAM: RENAL / URINARY TRACT ULTRASOUND COMPLETE COMPARISON:  CT 03/19/2020 FINDINGS: Right Kidney: Renal measurements: 11.3 x 4.6 x 4.7 cm = volume: 129.8 mL. There is a 1.9 cm simple cyst in the lower pole. No hydronephrosis. Mildly increased renal cortical echogenicity. Left Kidney: Renal measurements: 11.6 x 3.7 x 5.9 cm = volume: 75.1 mL. There is a 2.7 cm cystic lesion in the upper pole corresponding to a simple cyst seen on prior CT in February 2022. Increased cortical echogenicity. Bladder: Appears normal for degree of bladder distention. Other: None. IMPRESSION: No  hydronephrosis. Findings of medical renal disease bilaterally with left renal atrophy. Electronically Signed   By: Maurine Simmering M.D.   On: 01/09/2022 16:39   DG Chest 2 View  Result Date: 01/08/2022 CLINICAL DATA:  Difficulty in breathing. Shortness of breath for 10 days. EXAM: CHEST - 2 VIEW COMPARISON:  03/19/2020 FINDINGS: Normal heart size and pulmonary vascularity. No focal airspace disease or consolidation in the lungs. No blunting of costophrenic angles. No pneumothorax. Mediastinal  contours appear intact. Degenerative changes in the spine. IMPRESSION: No active cardiopulmonary disease. Electronically Signed   By: Lucienne Capers M.D.   On: 01/08/2022 19:10    Principal Problem:   Acute CHF (congestive heart failure) ([place])   Time spent: 55 minutes.   [facility] [employer], [facility]  If 7PM-7AM, please contact night-coverage [email or web address]   LOS: 2 days

## 2022-01-10 NOTE — Progress Notes (Signed)
BLE venous duplex has been completed.  Preliminary findings given to Horris Latino, Therapist, sports.   Results can be found under chart review under CV PROC. 01/10/2022 12:06 PM Owen Pagnotta RVT, RDMS

## 2022-01-11 DIAGNOSIS — I509 Heart failure, unspecified: Secondary | ICD-10-CM | POA: Diagnosis not present

## 2022-01-11 DIAGNOSIS — R0609 Other forms of dyspnea: Secondary | ICD-10-CM | POA: Diagnosis not present

## 2022-01-11 DIAGNOSIS — N179 Acute kidney failure, unspecified: Secondary | ICD-10-CM | POA: Diagnosis not present

## 2022-01-11 LAB — CBC
HCT: 31.8 % — ABNORMAL LOW (ref 36.0–46.0)
Hemoglobin: 9.9 g/dL — ABNORMAL LOW (ref 12.0–15.0)
MCH: 26.5 pg (ref 26.0–34.0)
MCHC: 31.1 g/dL (ref 30.0–36.0)
MCV: 85.3 fL (ref 80.0–100.0)
Platelets: 223 10*3/uL (ref 150–400)
RBC: 3.73 MIL/uL — ABNORMAL LOW (ref 3.87–5.11)
RDW: 14.9 % (ref 11.5–15.5)
WBC: 8.3 10*3/uL (ref 4.0–10.5)
nRBC: 0 % (ref 0.0–0.2)

## 2022-01-11 LAB — GLUCOSE, CAPILLARY
Glucose-Capillary: 156 mg/dL — ABNORMAL HIGH (ref 70–99)
Glucose-Capillary: 177 mg/dL — ABNORMAL HIGH (ref 70–99)
Glucose-Capillary: 124 mg/dL — ABNORMAL HIGH (ref 70–99)
Glucose-Capillary: 161 mg/dL — ABNORMAL HIGH (ref 70–99)

## 2022-01-11 LAB — HEPARIN LEVEL (UNFRACTIONATED): Heparin Unfractionated: 0.35 IU/mL (ref 0.30–0.70)

## 2022-01-11 NOTE — Progress Notes (Signed)
  Progress Note   Patient: MAJORIE SANTEE YTK:[id number] DOB: 1940/05/12 DOA: 01/08/2022     3 DOS: the patient was seen and examined on 01/11/2022   Brief hospital course: 81 year old female with HTN, DM 2, breast CVA followed by Dr. Jana Hakim was admitted yesterday for dyspnea.  Patient had recently been treated for pneumonia 2 weeks ago and had improved with antibiotics however symptoms recurred 3 to 4 days ago.  Workup in ED was suggestive of decompensated heart failure with elevated BNP of 700.  Chest x-ray however with no change in interstitial markings. Doppler ultrasound of the lower extremities revealed bilateral lower extremity DVT that is acute.  Assessment and Plan: Shortness of breath:  -Venous Doppler ultrasound of the lower extremities revealed bilateral acute DVT. -VQ scan is pending. -Continue heparin. -CT chest and chest x-ray not suggestive of pneumonia. -Echocardiogram suggestive of right ventricular strain.   Elevated BNP/right ventricular strain: -In the setting of acute bilateral lower extremity DVT with concerns for pulmonary embolism with right ventricular strain and chronic kidney disease stage IIIb at baseline.   -Shortness of breath is likely secondary to vaso-occlusive disease. -No orthopnea. -CT chest and chest x-ray were nonrevealing.   Moderate pulmonary hypertension: -Likely multifactorial, including, but may not be limited to vaso-occlusive disease.      HTN -Controlled.   -Goal blood pressure should be less than 130/80 mmHg.   -Will be careful with antihypertensives, considering right ventricular strain.   -Blood pressures are normal off all her multiple BP medications Hold home doses of Toprol-XL, HCTZ, amlodipine and enalapril   AKI on CKD 3B: -Current AKI is likely secondary to hemodynamic changes/possible ATN. -Slight improvement is noted. -Continue to monitor renal function and electrolytes. -Follow-up with nephrology team on discharge. -CKD  is likely secondary to combined diabetes mellitus and hypertension.     Hypokalemia Continue to monitor and replete.   Breast cancer -Have tagged pt's oncologist to treatment team   DM 2 with hyperglycemia Agree with SSI given new kidney injury Blood sugars under reasonable control on present regimen   Anxiety and depression Continue Lexapro   Hypothyroidism Continue Synthroid         Subjective: Eager to go home soon  Physical Exam: Vitals:   01/10/22 2014 01/11/22 0536 01/11/22 0603 01/11/22 1327  BP: 136/65 136/72  134/69  Pulse: 96 88  98  Resp: '18 20  20  '$ Temp: 99.6 F (37.6 C) 98.4 F (36.9 C)  98.9 F (37.2 C)  TempSrc: Oral Oral  Oral  SpO2: 97% 95%  94%  Weight:   79.7 kg   Height:       General exam: Awake, laying in bed, in nad Respiratory system: Normal respiratory effort, no wheezing Cardiovascular system: regular rate, s1, s2 Gastrointestinal system: Soft, nondistended, positive BS Central nervous system: CN2-12 grossly intact, strength intact Extremities: Perfused, no clubbing Skin: Normal skin turgor, no notable skin lesions seen Psychiatry: Mood normal // no visual hallucinations   Data Reviewed:  Labs reviewed: WBC 8.3, Hgb 9.9  Family Communication: Pt in room, family at bedside  Disposition: Status is: Inpatient Remains inpatient appropriate because: Severity of illness  Planned Discharge Destination: Home    Author: Marylu Lund, MD 01/11/2022 6:33 PM  For on call review [email or web address].

## 2022-01-11 NOTE — Hospital Course (Signed)
81 year old female with HTN, DM 2, breast CVA followed by Dr. Jana Hakim was admitted yesterday for dyspnea.  Patient had recently been treated for pneumonia 2 weeks ago and had improved with antibiotics however symptoms recurred 3 to 4 days ago.  Workup in ED was suggestive of decompensated heart failure with elevated BNP of 700.  Chest x-ray however with no change in interstitial markings. Doppler ultrasound of the lower extremities revealed bilateral lower extremity DVT that is acute.

## 2022-01-11 NOTE — Progress Notes (Addendum)
ANTICOAGULATION CONSULT NOTE - Follow Up Consult  Pharmacy Consult for Heparin Indication: DVT, r/o PE  Allergies  Allergen Reactions   Amitriptyline Other (See Comments)    hallucinations   Aspirin Other (See Comments)    Bleeding   Nsaids Other (See Comments)    Lower GI bleeding   Meperidine Hcl Other (See Comments)    Causes migraines Other reaction(s): migraine   Morphine And Related Rash   Morphine Sulfate Rash    Other reaction(s): rash    Patient Measurements: Height: '5\' 2"'$  (157.5 cm) Weight: 79.7 kg (175 lb 11.3 oz) IBW/kg (Calculated) : 50.1 Heparin Dosing Weight: 68kg  Vital Signs: Temp: 98.4 F (36.9 C) (12/17 0536) Temp Source: Oral (12/17 0536) BP: 136/72 (12/17 0536) Pulse Rate: 88 (12/17 0536)  Labs: Recent Labs    01/08/22 1832 01/08/22 1832 01/08/22 2044 01/09/22 1555 01/10/22 0453 01/10/22 1256 01/10/22 2125 01/11/22 0423  HGB 12.2  --   --   --  9.9*  --   --  9.9*  HCT 38.4  --   --   --  31.8*  --   --  31.8*  PLT 219  --   --   --  224  --   --  223  HEPARINUNFRC  --    < >  --   --  0.86* 0.43 0.38 0.35  CREATININE 2.42*  --   --  1.77* 1.82*  --   --   --   TROPONINIHS 20*  --  15  --   --   --   --   --    < > = values in this interval not displayed.    Estimated Creatinine Clearance: 24.5 mL/min (A) (by C-G formula based on SCr of 1.82 mg/dL (H)).   Medications:  Infusions:   heparin 900 Units/hr (01/11/22 0126)    Assessment: 59 yoF presented on 12/14 with dyspnea s/p pneumonia treatment about 2 weeks ago.  ECHO shows normal LV function but decreased RV function, possibly suggestive of acute PE. VQ scan ordered. Not on anticoagulation PTA.  Dopplers positive for bilateral DVT.  Pharmacy is consulted to dose Heparin IV.   Today, 01/11/2022: Confirmatory heparin level remains therapeutic on heparin at 900 units/hr CBC: Hgb down to 9.9, Plt 224 No bleeding or complications reported.   SCr 1.82 VQ Scan still ordered, not  yet completed.     Goal of Therapy:  Heparin level 0.3-0.7 units/ml Monitor platelets by anticoagulation protocol: Yes   Plan:  Continue heparin IV infusion at 900 units/hr Daily heparin level and CBC F/u VQ scan results, and long-term AC plans (if needed) Monitor for s/s bleeding or worsening thrombosis   Reuel Boom, PharmD, BCPS [phone] 01/11/2022, 12:16 AM   ADDENDUM Daily heparin level remains therapeutic on 900 units/hr, but trending towards borderline low No bleeding or infusion issues per RN  Plan: Increase heparin to 950 units/hr Daily heparin level and CBC  Reuel Boom, PharmD, BCPS [phone] 01/11/2022, 6:06 AM

## 2022-01-12 ENCOUNTER — Other Ambulatory Visit (HOSPITAL_COMMUNITY): Payer: Self-pay

## 2022-01-12 ENCOUNTER — Inpatient Hospital Stay (HOSPITAL_COMMUNITY): Payer: PPO

## 2022-01-12 DIAGNOSIS — I509 Heart failure, unspecified: Secondary | ICD-10-CM | POA: Diagnosis not present

## 2022-01-12 DIAGNOSIS — R0609 Other forms of dyspnea: Secondary | ICD-10-CM | POA: Diagnosis not present

## 2022-01-12 DIAGNOSIS — N179 Acute kidney failure, unspecified: Secondary | ICD-10-CM | POA: Diagnosis not present

## 2022-01-12 LAB — GLUCOSE, CAPILLARY
Glucose-Capillary: 135 mg/dL — ABNORMAL HIGH (ref 70–99)
Glucose-Capillary: 182 mg/dL — ABNORMAL HIGH (ref 70–99)
Glucose-Capillary: 147 mg/dL — ABNORMAL HIGH (ref 70–99)

## 2022-01-12 LAB — CBC
HCT: 31.6 % — ABNORMAL LOW (ref 36.0–46.0)
Hemoglobin: 9.8 g/dL — ABNORMAL LOW (ref 12.0–15.0)
MCH: 26.7 pg (ref 26.0–34.0)
MCHC: 31 g/dL (ref 30.0–36.0)
MCV: 86.1 fL (ref 80.0–100.0)
Platelets: 214 10*3/uL (ref 150–400)
RBC: 3.67 MIL/uL — ABNORMAL LOW (ref 3.87–5.11)
RDW: 15 % (ref 11.5–15.5)
WBC: 8.4 10*3/uL (ref 4.0–10.5)
nRBC: 0 % (ref 0.0–0.2)

## 2022-01-12 LAB — HEPARIN LEVEL (UNFRACTIONATED): Heparin Unfractionated: 0.43 IU/mL (ref 0.30–0.70)

## 2022-01-12 MED ORDER — ORAL CARE MOUTH RINSE: 15.0000 mL | OROMUCOSAL | Status: DC | PRN

## 2022-01-12 MED ORDER — APIXABAN 5 MG PO TABS: 5.0000 mg | ORAL_TABLET | Freq: Two times a day (BID) | ORAL | Status: DC

## 2022-01-12 MED ORDER — TECHNETIUM TO 99M ALBUMIN AGGREGATED
4.4000 | Freq: Once | INTRAVENOUS | Status: AC
  Administered 2022-01-12: 4.4 via INTRAVENOUS

## 2022-01-12 MED ORDER — APIXABAN 5 MG PO TABS: 10.0000 mg | ORAL_TABLET | Freq: Two times a day (BID) | ORAL | Status: DC

## 2022-01-12 MED ORDER — APIXABAN 5 MG PO TABS
10.0000 mg | ORAL_TABLET | Freq: Two times a day (BID) | ORAL | Status: DC
  Administered 2022-01-12: 10 mg via ORAL
  Filled 2022-01-12: qty 2

## 2022-01-12 MED ORDER — APIXABAN (ELIQUIS) VTE STARTER PACK (10MG AND 5MG)
ORAL_TABLET | ORAL | 0 refills | Status: DC
  Filled 2022-01-12: qty 74, 30d supply, fill #0

## 2022-01-12 NOTE — Progress Notes (Addendum)
ANTICOAGULATION CONSULT NOTE - Follow Up Consult  Pharmacy Consult for Heparin Indication: DVT, r/o PE  Allergies  Allergen Reactions   Amitriptyline Other (See Comments)    hallucinations   Aspirin Other (See Comments)    Bleeding   Nsaids Other (See Comments)    Lower GI bleeding   Meperidine Hcl Other (See Comments)    Causes migraines Other reaction(s): migraine   Morphine And Related Rash   Morphine Sulfate Rash    Other reaction(s): rash    Patient Measurements: Height: '5\' 2"'$  (157.5 cm) Weight: 79.7 kg (175 lb 11.3 oz) IBW/kg (Calculated) : 50.1 Heparin Dosing Weight: 68kg  Vital Signs: Temp: 98.3 F (36.8 C) (12/18 1225) Temp Source: Oral (12/18 1225) BP: 131/67 (12/18 1225) Pulse Rate: 81 (12/18 1225)  Labs: Recent Labs     0000 01/09/22 1555 01/10/22 0453 01/10/22 1256 01/10/22 2125 01/11/22 0423 01/12/22 0443  HGB   < >  --  9.9*  --   --  9.9* 9.8*  HCT  --   --  31.8*  --   --  31.8* 31.6*  PLT  --   --  224  --   --  223 214  HEPARINUNFRC  --   --  0.86*   < > 0.38 0.35 0.43  CREATININE  --  1.77* 1.82*  --   --   --   --    < > = values in this interval not displayed.     Estimated Creatinine Clearance: 24.5 mL/min (A) (by C-G formula based on SCr of 1.82 mg/dL (H)).   Assessment: 33 yoF presented on 12/14 with dyspnea s/p pneumonia treatment about 2 weeks ago. ECHO shows normal LV function but decreased RV function, possibly suggestive of acute PE. VQ scan ordered. Not on anticoagulation PTA.  Dopplers positive for bilateral DVT.  Pharmacy is consulted to dose IV heparin.   Today, 01/12/2022: Heparin level = 0.43 units/mL, remains therapeutic on heparin infusion at 950 units/hr CBC: Hgb 9.8, low but stable. Plt WNL No bleeding or infusion issues reported SCr 1.82 (12/16) VQ Scan: Possible "moderate" lingular and "small" posterior segment of the right upper lobe perfusion defects compared to most recent chest radiograph 01/08/2022. By  Perfusion-only modified PIOPED II criteria, this quantifies to 1.5 mismatched segmental perfusion defects which unfortunately is in the "low to intermediate probability" for this perfusion only perfusion scan (nondiagnostic for pulmonary embolism). Consider correlation with lower extremity ultrasound.    Goal of Therapy:  Heparin level 0.3-0.7 units/ml Monitor platelets by anticoagulation protocol: Yes   Plan:  Continue IV heparin infusion at 950 units/hr Daily heparin level and CBC Monitor closely for s/sx of bleeding   Lindell Spar, PharmD, BCPS Clinical Pharmacist  01/12/2022 12:37 PM   Addendum: Asked to transition to Apixaban.   Plan: Stop heparin infusion At the same time, start Apixaban '10mg'$  PO BID x 7 days, then '5mg'$  PO BID thereafter. Monitor CBC and for bleeding Will provide education prior to discharge.   Lindell Spar, PharmD, BCPS Clinical Pharmacist  01/12/2022 2:57 PM

## 2022-01-12 NOTE — Discharge Summary (Signed)
Physician Discharge Summary   Patient: [name] MRN: [id number] DOB: 1940-08-02  Admit date:     01/08/2022  Discharge date: 01/12/22  Discharge Physician: Marylu Lund   PCP: [name], MD   Recommendations at discharge:    Follow up with PCP in 1-2 weeks  Discharge Diagnoses: Principal Problem:   Acute CHF (congestive heart failure) ([place])  Resolved Problems:   * No resolved hospital problems. *  Hospital Course: 81 year old female with HTN, DM 2, breast CVA followed by Dr. Jana Hakim was admitted yesterday for dyspnea.  Patient had recently been treated for pneumonia 2 weeks ago and had improved with antibiotics however symptoms recurred 3 to 4 days ago.  Workup in ED was suggestive of decompensated heart failure with elevated BNP of 700.  Chest x-ray however with no change in interstitial markings. Doppler ultrasound of the lower extremities revealed bilateral lower extremity DVT that is acute.  Assessment and Plan: Shortness of breath secondary to PE and DVT:  -Venous Doppler ultrasound of the lower extremities revealed bilateral acute DVT. -VQ scan with findings of possible moderate lingular and small posterior segment of RUL perfusion defects -Was initially continued on heparin, later transitioned to eliquis -CT chest and chest x-ray not suggestive of pneumonia. -Echocardiogram suggestive of right ventricular strain. -pt was able to ambulate in hall on RA   Elevated BNP/right ventricular strain: -In the setting of acute bilateral lower extremity DVT with concerns for pulmonary embolism with right ventricular strain and chronic kidney disease stage IIIb at baseline.   -Shortness of breath is likely secondary to vaso-occlusive disease. -No orthopnea. -CT chest and chest x-ray were nonrevealing.   Moderate pulmonary hypertension: -Likely multifactorial, including, but may not be limited to vaso-occlusive disease.      HTN -Controlled.   -Goal blood pressure  should be less than 130/80 mmHg.   -Will be careful with antihypertensives, considering right ventricular strain.   -Blood pressures are normal off all her multiple BP medications Hold home doses of Toprol-XL, HCTZ, amlodipine and enalapril -Cautiously resumed beta blocker on d/c   AKI on CKD 3B: -Current AKI is likely secondary to hemodynamic changes/possible ATN. -Slight improvement is noted. -Continue to monitor renal function and electrolytes. -Follow-up with nephrology team on discharge. -CKD is likely secondary to combined diabetes mellitus and hypertension.   -Hold nephrotoxic agents on d/c   Hypokalemia Continue to monitor and replete.   Breast cancer -Have tagged pt's oncologist to treatment team   DM 2 with hyperglycemia Agree with SSI given new kidney injury Blood sugars under reasonable control on present regimen -cont home meds on d/c   Anxiety and depression Continue Lexapro   Hypothyroidism Continue Synthroid       Consultants:  Procedures performed:   Disposition: Home Diet recommendation:  Regular diet DISCHARGE MEDICATION: Allergies as of 01/12/2022       Reactions   Amitriptyline Other (See Comments)   hallucinations   Aspirin Other (See Comments)   Bleeding   Nsaids Other (See Comments)   Lower GI bleeding   Meperidine Hcl Other (See Comments)   Causes migraines Other reaction(s): migraine   Morphine And Related Rash   Morphine Sulfate Rash   Other reaction(s): rash        Medication List     STOP taking these medications    amLODipine 10 MG tablet Commonly known as: NORVASC   amoxicillin-clavulanate 875-125 MG tablet Commonly known as: AUGMENTIN   anastrozole 1 MG tablet Commonly known as:  ARIMIDEX   enalapril 10 MG tablet Commonly known as: VASOTEC   hydrochlorothiazide 25 MG tablet Commonly known as: HYDRODIURIL   potassium chloride SA 20 MEQ tablet Commonly known as: KLOR-CON M   rosuvastatin 5 MG  tablet Commonly known as: CRESTOR   valsartan 160 MG tablet Commonly known as: DIOVAN       TAKE these medications    acetaminophen 325 MG tablet Commonly known as: TYLENOL Take 1-2 tablets (325-650 mg total) by mouth every 4 (four) hours as needed for mild pain.   diphenhydrAMINE 25 mg capsule Commonly known as: BENADRYL Take 1 capsule (25 mg total) by mouth every 6 (six) hours as needed for itching.   Eliquis DVT/PE Starter Pack Generic drug: Apixaban Starter Pack ('10mg'$  and '5mg'$ ) Take as directed on package: start with two-'5mg'$  tablets twice daily for 7 days. On day 8, switch to one-'5mg'$  tablet twice daily.   escitalopram 10 MG tablet Commonly known as: LEXAPRO Take 10 mg daily by mouth.   Farxiga 5 MG Tabs tablet Generic drug: dapagliflozin propanediol Take 5 mg by mouth daily.   glimepiride 2 MG tablet Commonly known as: AMARYL Take 2 mg by mouth 2 (two) times daily.   Levothyroxine Sodium 100 MCG Caps Take 100 mcg daily by mouth.   metoprolol succinate 50 MG 24 hr tablet Commonly known as: TOPROL-XL Take 50 mg by mouth daily.   promethazine-dextromethorphan 6.25-15 MG/5ML syrup Commonly known as: PROMETHAZINE-DM Take 5 mLs by mouth every 6 (six) hours as needed for cough.   traMADol 50 MG tablet Commonly known as: ULTRAM Take 50 mg by mouth every 8 (eight) hours as needed for moderate pain.   traZODone 50 MG tablet Commonly known as: DESYREL Take 0.5-1 tablets (25-50 mg total) by mouth at bedtime as needed for sleep. What changed: how much to take        Follow-up Information     [name], MD Follow up in 1 week(s).   Specialty: Family Medicine Why: Hospital follow up Contact information: [address] [place] [address] [phone]                Discharge Exam: Danley Danker Weights   01/10/22 0500 01/11/22 0603 01/12/22 0500  Weight: 78.8 kg 79.7 kg 79.7 kg   General exam: Awake, laying in bed, in nad Respiratory  system: Normal respiratory effort, no wheezing Cardiovascular system: regular rate, s1, s2 Gastrointestinal system: Soft, nondistended, positive BS Central nervous system: CN2-12 grossly intact, strength intact Extremities: Perfused, no clubbing Skin: Normal skin turgor, no notable skin lesions seen Psychiatry: Mood normal // no visual hallucinations   Condition at discharge: fair  The results of significant diagnostics from this hospitalization (including imaging, microbiology, ancillary and laboratory) are listed below for reference.   Imaging Studies: DG CHEST PORT 1 VIEW  Result Date: 01/12/2022 CLINICAL DATA:  Pulmonary embolism, shortness of breath, CHF EXAM: PORTABLE CHEST 1 VIEW COMPARISON:  Radiograph 01/08/2022 FINDINGS: Unchanged enlarged cardiac silhouette. There is no focal airspace consolidation. There is no pleural effusion or evidence of pneumothorax. Thoracic spondylosis. No acute osseous abnormality. Bilateral shoulder degenerative changes. IMPRESSION: Unchanged enlarged cardiac silhouette.  No focal airspace disease. Electronically Signed   By: Maurine Simmering M.D.   On: 01/12/2022 14:17   NM Pulmonary Perfusion  Result Date: 01/12/2022 CLINICAL DATA:  Pulmonary embolism suspected. High probability. Shortness of breath since 05/2021. EXAM: NUCLEAR MEDICINE PERFUSION LUNG SCAN TECHNIQUE: Perfusion images were obtained in multiple projections after intravenous  injection of radiopharmaceutical. Ventilation scans intentionally deferred if perfusion scan and chest x-ray adequate for interpretation during COVID 19 epidemic. RADIOPHARMACEUTICALS:  4.4 mCi Tc-6mMAA IV COMPARISON:  No prior V/Q scan available for comparison; the most recent chest radiographs for correlation is chest two views 01/08/2022; CT chest without contrast 01/10/2022, CT chest with contrast 03/19/2020 FINDINGS: The silhouette of the heart, aortic arch, superior vena cava, and ascending great vessels appears  unchanged from prior CTs, with heart size at the upper limits of normal. There is a moderate (25-75%) apparent defect at the anterolateral aspect of the left lung, possibly within the superior segment of the lingula on LPO and LAO views. Possible small (less than 25%) defect within the approximate posterior segment of the right upper lobe. IMPRESSION: Possible "moderate" lingular and "small" posterior segment of the right upper lobe perfusion defects compared to most recent chest radiograph 01/08/2022. By Perfusion-only modified PIOPED II criteria, this quantifies to 1.5 mismatched segmental perfusion defects which unfortunately is in the "low to intermediate probability" for this perfusion only perfusion scan (nondiagnostic for pulmonary embolism). Consider correlation with lower extremity ultrasound. Electronically Signed   By: RYvonne KendallM.D.   On: 01/12/2022 11:26   VAS [place]OWER EXTREMITY VENOUS (DVT)  Result Date: 01/11/2022  Lower Venous DVT Study Patient Name:  JARELIE KUZEL Date of Exam:   01/10/2022 Medical Rec #: [id number]       Accession #:    [id number] of Birth: 911-03-1940       Patient Gender: F Patient Age:   89years [facility]      VAS [place]OWER EXTREMITY VENOUS (DVT) Referring Phys: SDewaine OatsCHATTERJEE --------------------------------------------------------------------------------  Indications: Dyspnea on exertion.  Comparison Study: Previous exam on 03/24/20 was negative for DVT Performing Technologist: JRogelia RohrerRVT, RDMS  Examination Guidelines: A complete evaluation includes B-mode imaging, spectral Doppler, color Doppler, and power Doppler as needed of all accessible portions of each vessel. Bilateral testing is considered an integral part of a complete examination. Limited examinations for reoccurring indications may be performed as noted. The reflux portion of the exam is performed with the patient in reverse Trendelenburg.   +---------+---------------+---------+-----------+----------+------------------+ RIGHT    CompressibilityPhasicitySpontaneityPropertiesThrombus Aging     +---------+---------------+---------+-----------+----------+------------------+ CFV      Full           Yes      Yes                                     +---------+---------------+---------+-----------+----------+------------------+ SFJ      Full                                                            +---------+---------------+---------+-----------+----------+------------------+ FV Prox  Full           Yes      Yes                                     +---------+---------------+---------+-----------+----------+------------------+ FV Mid   Full           Yes  Yes                                     +---------+---------------+---------+-----------+----------+------------------+ FV DistalFull           Yes      Yes                                     +---------+---------------+---------+-----------+----------+------------------+ PFV      Full                                                            +---------+---------------+---------+-----------+----------+------------------+ POP      Full           Yes      Yes                                     +---------+---------------+---------+-----------+----------+------------------+ PTV      None           No       No                   Acute - one of                                                           paired             +---------+---------------+---------+-----------+----------+------------------+ PERO     Full                                         Not well                                                                 visualized         +---------+---------------+---------+-----------+----------+------------------+   +---------+---------------+---------+-----------+----------+------------------+ LEFT      CompressibilityPhasicitySpontaneityPropertiesThrombus Aging     +---------+---------------+---------+-----------+----------+------------------+ CFV      Full           Yes      Yes                                     +---------+---------------+---------+-----------+----------+------------------+ SFJ      Full                                                            +---------+---------------+---------+-----------+----------+------------------+  FV Prox  Full           Yes      Yes                                     +---------+---------------+---------+-----------+----------+------------------+ FV Mid   Full           Yes      Yes                                     +---------+---------------+---------+-----------+----------+------------------+ FV DistalFull           Yes      Yes                                     +---------+---------------+---------+-----------+----------+------------------+ PFV      Full                                                            +---------+---------------+---------+-----------+----------+------------------+ POP      Full           Yes      Yes                                     +---------+---------------+---------+-----------+----------+------------------+ PTV      None           No       No                   Acute - one of                                                           paired             +---------+---------------+---------+-----------+----------+------------------+ PERO     None           No       No                   Acute              +---------+---------------+---------+-----------+----------+------------------+     Summary: RIGHT: - Findings consistent with acute deep vein thrombosis involving the one of the right posterior tibial veins. - No cystic structure found in the popliteal fossa.  LEFT: - Findings consistent with acute deep vein thrombosis involving the left peroneal veins,  and one of the left posterior tibial veins. - A cystic structure is found in the popliteal fossa (5.7 x 1.1 x 2.3 cm).  *See table(s) above for measurements and observations. Electronically signed by Jamelle Haring on 01/11/2022 at 11:15:05 AM.    Final    CT CHEST WO CONTRAST  Result Date: 01/10/2022 CLINICAL DATA:  Dyspnea, chronic, unclear etiology EXAM: CT CHEST WITHOUT CONTRAST TECHNIQUE: Multidetector CT imaging of the  chest was performed following the standard protocol without IV contrast. RADIATION DOSE REDUCTION: This exam was performed according to the departmental dose-optimization program which includes automated exposure control, adjustment of the mA and/or kV according to patient size and/or use of iterative reconstruction technique. COMPARISON:  Chest x-ray 01/08/2022, chest CT 03/19/2020 FINDINGS: Cardiovascular: Heart size is normal. No pericardial effusion. Thoracic aorta is nonaneurysmal. Scattered atherosclerotic vascular calcifications of the aorta and coronary arteries. Main pulmonary artery measures 3.3 cm in diameter. Mediastinum/Nodes: No enlarged mediastinal or axillary lymph nodes. Thyroid gland, trachea, and esophagus demonstrate no significant findings. Lungs/Pleura: Small nodular density within the infrahilar aspect of the left lower lobe with surrounding ground-glass attenuation. The central solid component measures up to 8 mm in diameter (series 5, image 106; series 7, image 84). Subtle ground-glass opacity within the right perihilar region. Chronic linear scarring in the anterior aspect of the left upper lobe. No pleural effusion or pneumothorax. Upper Abdomen: Atrophic left kidney containing nonobstructing stones. No acute findings. Musculoskeletal: Exaggerated thoracic kyphosis with flowing anterior endplate osteophytes compatible with diffuse idiopathic skeletal hyperostosis. No acute bony findings. No suspicious bone lesion. No chest wall abnormality. IMPRESSION: 1. Small  nodular density within the infrahilar aspect of the left lower lobe with surrounding ground-glass attenuation. The central solid component measures up to 8 mm in diameter. Findings may represent an infectious or inflammatory process. Neoplasm not excluded. Follow-up non-contrast CT recommended at 3-6 months to confirm persistence. If unchanged, and solid component remains <6 mm, annual CT is recommended until 5 years of stability has been established. If persistent these nodules should be considered highly suspicious if the solid component of the nodule is 6 mm or greater in size and enlarging. This recommendation follows the consensus statement: Guidelines for Management of Incidental Pulmonary Nodules Detected on CT Images: From the Fleischner Society 2017; Radiology 2017; 284:228-243. 2. Subtle ground-glass opacity within the right perihilar region, which may also represent an infectious or inflammatory process or site of air trapping. 3. Aortic and coronary artery atherosclerosis (ICD10-I70.0). Electronically Signed   By: Davina Poke D.O.   On: 01/10/2022 16:14   ECHOCARDIOGRAM COMPLETE  Result Date: 01/09/2022    ECHOCARDIOGRAM REPORT   Patient Name:   TRINISHA PAGET Date of Exam: 01/09/2022 Medical Rec #:  [id number]       Height:       62.0 in Accession #:    [id number]      Weight:       177.0 lb Date of Birth:  [birth year]       BSA:          1.815 m Patient Age:    45 years        BP:           101/50 mmHg Patient Gender: F               HR:           92 bpm. Exam Location:  Inpatient Procedure: 2D Echo, Cardiac Doppler, Color Doppler and Intracardiac            Opacification Agent  Results communicated to Dr Jamse Arn at 1656 on 01/09/22. Indications:    CHF-Acute Diastolic D40.81  History:        Patient has no prior history of Echocardiogram examinations.                 Risk Factors:Hypertension, GERD and Diabetes.  Sonographer:    Bernadene Person RDCS Referring  Phys: [id number] [place]  1. Right ventricular systolic function is moderately reduced. The right ventricular size is mildly enlarged. There is moderately elevated pulmonary artery systolic pressure. The estimated right ventricular systolic pressure is 32.6 mmHg. There is free wall hypokinesis with normal motion at apex, consistent with McConnell's sign, which can be seen in PE. Recommend ruling out PE  2. Left ventricular ejection fraction, by estimation, is 70 to 75%. The left ventricle has hyperdynamic function. The left ventricle has no regional wall motion abnormalities. There is mild left ventricular hypertrophy. Left ventricular diastolic parameters are consistent with Grade I diastolic dysfunction (impaired relaxation). There is the interventricular septum is flattened in systole and diastole, consistent with right ventricular pressure and volume overload.  3. The mitral valve is normal in structure. No evidence of mitral valve regurgitation.  4. Tricuspid valve regurgitation is mild to moderate.  5. The aortic valve is tricuspid. Aortic valve regurgitation is not visualized. No aortic stenosis is present.  6. The inferior vena cava is normal in size with greater than 50% respiratory variability, suggesting right atrial pressure of 3 mmHg. FINDINGS  Left Ventricle: Left ventricular ejection fraction, by estimation, is 70 to 75%. The left ventricle has hyperdynamic function. The left ventricle has no regional wall motion abnormalities. Definity contrast agent was given IV to delineate the left ventricular endocardial borders. The left ventricular internal cavity size was small. There is mild left ventricular hypertrophy. The interventricular septum is flattened in systole and diastole, consistent with right ventricular pressure and volume overload. Left ventricular diastolic parameters are consistent with Grade I diastolic dysfunction (impaired relaxation). Right Ventricle: The right ventricular size is mildly enlarged.  Right vetricular wall thickness was not well visualized. Right ventricular systolic function is moderately reduced. There is moderately elevated pulmonary artery systolic pressure. The tricuspid regurgitant velocity is 3.28 m/s, and with an assumed right atrial pressure of 3 mmHg, the estimated right ventricular systolic pressure is 71.2 mmHg. Left Atrium: Left atrial size was normal in size. Right Atrium: Right atrial size was normal in size. Pericardium: There is no evidence of pericardial effusion. Mitral Valve: The mitral valve is normal in structure. No evidence of mitral valve regurgitation. Tricuspid Valve: The tricuspid valve is normal in structure. Tricuspid valve regurgitation is mild to moderate. Aortic Valve: The aortic valve is tricuspid. Aortic valve regurgitation is not visualized. No aortic stenosis is present. Pulmonic Valve: The pulmonic valve was not well visualized. Pulmonic valve regurgitation is trivial. Aorta: The aortic root and ascending aorta are structurally normal, with no evidence of dilitation. Venous: The inferior vena cava is normal in size with greater than 50% respiratory variability, suggesting right atrial pressure of 3 mmHg. IAS/Shunts: The interatrial septum was not well visualized.  LEFT VENTRICLE PLAX 2D LVIDd:         3.60 cm     Diastology LVIDs:         2.20 cm     LV e' medial:    4.88 cm/s LV PW:         1.10 cm     LV E/e' medial:  9.8 LV IVS:        1.00 cm     LV e' lateral:   5.55 cm/s LVOT diam:     1.90 cm     LV E/e' lateral: 8.6 LV SV:         54 LV SV Index:   30 LVOT Area:     2.84  cm  LV Volumes (MOD) LV vol d, MOD A2C: 38.3 ml LV vol d, MOD A4C: 33.4 ml LV vol s, MOD A2C: 8.8 ml LV vol s, MOD A4C: 9.3 ml LV SV MOD A2C:     29.5 ml LV SV MOD A4C:     33.4 ml LV SV MOD BP:      26.2 ml RIGHT VENTRICLE RV S prime:     9.38 cm/s TAPSE (M-mode): 1.6 cm LEFT ATRIUM             Index        RIGHT ATRIUM           Index LA diam:        2.90 cm 1.60 cm/m   RA Area:      14.40 cm LA Vol (A2C):   43.8 ml 24.13 ml/m  RA Volume:   36.00 ml  19.83 ml/m LA Vol (A4C):   39.0 ml 21.49 ml/m LA Biplane Vol: 44.8 ml 24.68 ml/m  AORTIC VALVE LVOT Vmax:   107.00 cm/s LVOT Vmean:  70.400 cm/s LVOT VTI:    0.191 m  AORTA Ao Root diam: 3.20 cm Ao Asc diam:  3.20 cm MITRAL VALVE               TRICUSPID VALVE MV Area (PHT): 4.17 cm    TR Peak grad:   43.0 mmHg MV Decel Time: 182 msec    TR Vmax:        328.00 cm/s MV E velocity: 48.00 cm/s MV A velocity: 86.80 cm/s  SHUNTS MV E/A ratio:  0.55        Systemic VTI:  0.19 m                            Systemic Diam: 1.90 cm Oswaldo Milian MD Electronically signed by Oswaldo Milian MD Signature Date/Time: 01/09/2022/5:01:58 PM    Final    US RENAL  Result Date: 01/09/2022 CLINICAL DATA:  Acute kidney injury EXAM: RENAL / URINARY TRACT ULTRASOUND COMPLETE COMPARISON:  CT 03/19/2020 FINDINGS: Right Kidney: Renal measurements: 11.3 x 4.6 x 4.7 cm = volume: 129.8 mL. There is a 1.9 cm simple cyst in the lower pole. No hydronephrosis. Mildly increased renal cortical echogenicity. Left Kidney: Renal measurements: 11.6 x 3.7 x 5.9 cm = volume: 75.1 mL. There is a 2.7 cm cystic lesion in the upper pole corresponding to a simple cyst seen on prior CT in February 2022. Increased cortical echogenicity. Bladder: Appears normal for degree of bladder distention. Other: None. IMPRESSION: No hydronephrosis. Findings of medical renal disease bilaterally with left renal atrophy. Electronically Signed   By: Maurine Simmering M.D.   On: 01/09/2022 16:39   DG Chest 2 View  Result Date: 01/08/2022 CLINICAL DATA:  Difficulty in breathing. Shortness of breath for 10 days. EXAM: CHEST - 2 VIEW COMPARISON:  03/19/2020 FINDINGS: Normal heart size and pulmonary vascularity. No focal airspace disease or consolidation in the lungs. No blunting of costophrenic angles. No pneumothorax. Mediastinal contours appear intact. Degenerative changes in the spine.  IMPRESSION: No active cardiopulmonary disease. Electronically Signed   By: Lucienne Capers M.D.   On: 01/08/2022 19:10    Microbiology: Results for orders placed or performed during the hospital encounter of 01/08/22  Resp panel by RT-PCR (RSV, Flu A&B, Covid) Anterior Nasal Swab     Status: None   Collection Time: 01/08/22  6:22 PM   Specimen: Anterior  Nasal Swab  Result Value Ref Range Status   SARS Coronavirus 2 by RT PCR NEGATIVE NEGATIVE Final    Comment: (NOTE) SARS-CoV-2 target nucleic acids are NOT DETECTED.  The SARS-CoV-2 RNA is generally detectable in upper respiratory specimens during the acute phase of infection. The lowest concentration of SARS-CoV-2 viral copies this assay can detect is 138 copies/mL. A negative result does not preclude SARS-Cov-2 infection and should not be used as the sole basis for treatment or other patient management decisions. A negative result may occur with  improper specimen collection/handling, submission of specimen other than nasopharyngeal swab, presence of viral mutation(s) within the areas targeted by this assay, and inadequate number of viral copies(<138 copies/mL). A negative result must be combined with clinical observations, patient history, and epidemiological information. The expected result is Negative.  Fact Sheet for Patients:  EntrepreneurPulse.com.au  Fact Sheet for Healthcare Providers:  IncredibleEmployment.be  This test is no t yet approved or cleared by the [place] FDA and  has been authorized for detection and/or diagnosis of SARS-CoV-2 by FDA under an Emergency Use Authorization (EUA). This EUA will remain  in effect (meaning this test can be used) for the duration of the COVID-19 declaration under Section 564(b)(1) of the Act, 21 U.S.C.section 360bbb-3(b)(1), unless the authorization is terminated  or revoked sooner.       Influenza A by PCR NEGATIVE NEGATIVE Final    Influenza B by PCR NEGATIVE NEGATIVE Final    Comment: (NOTE) The Xpert Xpress SARS-CoV-2/FLU/RSV plus assay is intended as an aid in the diagnosis of influenza from Nasopharyngeal swab specimens and should not be used as a sole basis for treatment. Nasal washings and aspirates are unacceptable for Xpert Xpress SARS-CoV-2/FLU/RSV testing.  Fact Sheet for Patients: EntrepreneurPulse.com.au  Fact Sheet for Healthcare Providers: IncredibleEmployment.be  This test is not yet approved or cleared by the [place] FDA and has been authorized for detection and/or diagnosis of SARS-CoV-2 by FDA under an Emergency Use Authorization (EUA). This EUA will remain in effect (meaning this test can be used) for the duration of the COVID-19 declaration under Section 564(b)(1) of the Act, 21 U.S.C. section 360bbb-3(b)(1), unless the authorization is terminated or revoked.     Resp Syncytial Virus by PCR NEGATIVE NEGATIVE Final    Comment: (NOTE) Fact Sheet for Patients: EntrepreneurPulse.com.au  Fact Sheet for Healthcare Providers: IncredibleEmployment.be  This test is not yet approved or cleared by the [place] FDA and has been authorized for detection and/or diagnosis of SARS-CoV-2 by FDA under an Emergency Use Authorization (EUA). This EUA will remain in effect (meaning this test can be used) for the duration of the COVID-19 declaration under Section 564(b)(1) of the Act, 21 U.S.C. section 360bbb-3(b)(1), unless the authorization is terminated or revoked.  Performed at [facility], [place] [address] [place], [place] [address]     Labs: CBC: Recent Labs  Lab 01/08/22 1832 01/10/22 0453 01/11/22 0423 01/12/22 0443  WBC 9.5 7.4 8.3 8.4  NEUTROABS 6.6  --   --   --   HGB 12.2 9.9* 9.9* 9.8*  HCT 38.4 31.8* 31.8* 31.6*  MCV 84.4 85.0 85.3 86.1  PLT 219 224 223 354   Basic Metabolic  Panel: Recent Labs  Lab 01/08/22 1832 01/09/22 1555 01/10/22 0453  NA 134* 138 138  K 2.9* 3.8 3.4*  CL 101 108 107  CO2 18* 20* 21*  GLUCOSE 206* 123* 129*  BUN 30* 23 19  CREATININE 2.42* 1.77* 1.82*  CALCIUM 8.8* 9.0 8.5*   Liver Function Tests: Recent Labs  Lab 01/10/22 0453  AST 15  ALT 17  ALKPHOS 70  BILITOT 0.6  PROT 5.8*  ALBUMIN 2.7*   CBG: Recent Labs  Lab 01/11/22 1153 01/11/22 1627 01/11/22 2051 01/12/22 0748 01/12/22 1128  GLUCAP 177* 124* 161* 135* 182*    Discharge time spent: less than 30 minutes.  Signed: Marylu Lund, MD [facility] 01/12/2022

## 2022-01-12 NOTE — Progress Notes (Signed)
SATURATION QUALIFICATIONS: (This note is used to comply with regulatory documentation for home oxygen)  Patient Saturations on Room Air at Rest = 97%  Patient Saturations on Room Air while Ambulating = 94%  Please briefly explain why patient needs home oxygen: She does not need home oxygen.

## 2022-01-12 NOTE — Discharge Instructions (Addendum)
Information on my medicine - ELIQUIS (apixaban)  Why was Eliquis prescribed for you? Eliquis was prescribed to treat blood clots that may have been found in the veins of your legs (deep vein thrombosis) or in your lungs (pulmonary embolism) and to reduce the risk of them occurring again.  What do You need to know about Eliquis ? The starting dose is 10 mg (two 5 mg tablets) taken TWICE daily for the FIRST SEVEN (7) DAYS, then on Day 8, the dose is reduced to ONE 5 mg tablet taken TWICE daily.  Eliquis may be taken with or without food.   Try to take the dose about the same time in the morning and in the evening. If you have difficulty swallowing the tablet whole please discuss with your pharmacist how to take the medication safely.  Take Eliquis exactly as prescribed and DO NOT stop taking Eliquis without talking to the doctor who prescribed the medication.  Stopping may increase your risk of developing a new blood clot.  Refill your prescription before you run out.  After discharge, you should have regular check-up appointments with your healthcare provider that is prescribing your Eliquis.    What do you do if you miss a dose? If a dose of ELIQUIS is not taken at the scheduled time, take it as soon as possible on the same day and twice-daily administration should be resumed. The dose should not be doubled to make up for a missed dose.  Important Safety Information A possible side effect of Eliquis is bleeding. You should call your healthcare provider right away if you experience any of the following: Bleeding from an injury or your nose that does not stop. Unusual colored urine (red or dark brown) or unusual colored stools (red or black). Unusual bruising for unknown reasons. A serious fall or if you hit your head (even if there is no bleeding).  Some medicines may interact with Eliquis and might increase your risk of bleeding or clotting while on Eliquis. To help avoid this,  consult your healthcare provider or pharmacist prior to using any new prescription or non-prescription medications, including herbals, vitamins, non-steroidal anti-inflammatory drugs (NSAIDs) and supplements.  This website has more information on Eliquis (apixaban): http://www.eliquis.com/eliquis/home

## 2022-01-12 NOTE — TOC Benefit Eligibility Note (Signed)
Patient Teacher, English as a foreign language completed.    The patient is currently admitted and upon discharge could be taking Eliquis 5 mg.  The current 30 day co-pay is $136.91.   The patient is insured through [place] Patient Advocate Specialist [place] Patient Advocate Team Direct Number: [phone]  Fax: [phone]

## 2022-01-13 DIAGNOSIS — Z86718 Personal history of other venous thrombosis and embolism: Secondary | ICD-10-CM | POA: Diagnosis not present

## 2022-01-13 DIAGNOSIS — I509 Heart failure, unspecified: Secondary | ICD-10-CM | POA: Diagnosis not present

## 2022-01-13 DIAGNOSIS — Z86711 Personal history of pulmonary embolism: Secondary | ICD-10-CM | POA: Diagnosis not present

## 2022-01-16 DIAGNOSIS — F331 Major depressive disorder, recurrent, moderate: Secondary | ICD-10-CM | POA: Diagnosis not present

## 2022-01-16 DIAGNOSIS — N179 Acute kidney failure, unspecified: Secondary | ICD-10-CM | POA: Diagnosis not present

## 2022-01-16 DIAGNOSIS — N183 Chronic kidney disease, stage 3 unspecified: Secondary | ICD-10-CM | POA: Diagnosis not present

## 2022-01-16 DIAGNOSIS — Z86718 Personal history of other venous thrombosis and embolism: Secondary | ICD-10-CM | POA: Diagnosis not present

## 2022-01-16 DIAGNOSIS — E1169 Type 2 diabetes mellitus with other specified complication: Secondary | ICD-10-CM | POA: Diagnosis not present

## 2022-01-16 DIAGNOSIS — Z86711 Personal history of pulmonary embolism: Secondary | ICD-10-CM | POA: Diagnosis not present

## 2022-01-16 DIAGNOSIS — R911 Solitary pulmonary nodule: Secondary | ICD-10-CM | POA: Diagnosis not present

## 2022-01-16 DIAGNOSIS — I509 Heart failure, unspecified: Secondary | ICD-10-CM | POA: Diagnosis not present

## 2022-01-16 DIAGNOSIS — I129 Hypertensive chronic kidney disease with stage 1 through stage 4 chronic kidney disease, or unspecified chronic kidney disease: Secondary | ICD-10-CM | POA: Diagnosis not present

## 2022-01-16 DIAGNOSIS — Z853 Personal history of malignant neoplasm of breast: Secondary | ICD-10-CM | POA: Diagnosis not present

## 2022-01-28 DIAGNOSIS — I129 Hypertensive chronic kidney disease with stage 1 through stage 4 chronic kidney disease, or unspecified chronic kidney disease: Secondary | ICD-10-CM | POA: Diagnosis not present

## 2022-01-28 DIAGNOSIS — E039 Hypothyroidism, unspecified: Secondary | ICD-10-CM | POA: Diagnosis not present

## 2022-01-28 DIAGNOSIS — E1169 Type 2 diabetes mellitus with other specified complication: Secondary | ICD-10-CM | POA: Diagnosis not present

## 2022-01-28 DIAGNOSIS — E785 Hyperlipidemia, unspecified: Secondary | ICD-10-CM | POA: Diagnosis not present

## 2022-01-28 DIAGNOSIS — N1832 Chronic kidney disease, stage 3b: Secondary | ICD-10-CM | POA: Diagnosis not present

## 2022-01-28 DIAGNOSIS — K219 Gastro-esophageal reflux disease without esophagitis: Secondary | ICD-10-CM | POA: Diagnosis not present

## 2022-02-02 ENCOUNTER — Ambulatory Visit: Payer: PPO | Admitting: Internal Medicine

## 2022-02-02 DIAGNOSIS — I50811 Acute right heart failure: Secondary | ICD-10-CM

## 2022-02-02 NOTE — Progress Notes (Deleted)
[place]  [phone]) [phone] Fax:(336) [phone]   NB: Patient did not show for he 03/15/2017 appt  ID: [name] DOB: 18-Jun-1940  MR#: [id number]  EZM#:[id number]  Patient Care Team: Alyssa Stains, MD as PCP - General (Family Medicine) Hernandez, Alyssa Dad, MD (Inactive) as Consulting Physician (Oncology) Alyssa Kaufman, MD as Consulting Physician (Orthopedic Surgery) Alyssa Craver, MD as Consulting Physician (Gastroenterology) Alyssa Pigg, MD as Consulting Physician (Dermatology) Alyssa Rhodes, MD (Inactive) as Consulting Physician (Urology) Alyssa Rudd, MD as Consulting Physician (Radiation Oncology) Alyssa Bookbinder, MD as Consulting Physician (General Surgery) OTHER MD:  CHIEF COMPLAINT: estrogen receptor positive breast cancer  CURRENT TREATMENT: Anastrozole   INTERVAL HISTORY:  [name] returns today for follow-up of of her estrogen receptor positive breast cancer.  She is here for follow-up on anastrozole but tells me she ran out of her first week of October and hence discontinued it.  She is excited by the fact that she may not have to take it anymore since she is almost on it for 5 years. She denies any new breast changes.  Rest of the pertinent 10 point ROS reviewed and negative.   REVIEW OF SYSTEMS: [name] tells me they have lost 4 relatives in the last year.  2 of them were in-laws.  She is currently looking after her sister-in-law who had knee surgery but otherwise [name] is not exercising regularly.  She does take occasional walks.   COVID 19 VACCINATION STATUS: Had Moderna vaccine x2 then had Covid October 2021, did receive antibiotic infusion; had COVID again subsequently and again received an infusion   HISTORY OF CURRENT ILLNESS:  From the original intake note:  [name] initially palpated a lump to her left breast while watching TV which she brought to medical attention and was evaluated 1 week following by her PCP, [name]. She then  had an unilateral diagnostic left mammography with tomography and CAD and left breast ultrasonography at [facility] on 11/17/2016 showing a 1.6 cm mass in the upper left breast suspicous for malignancy. The left axilla was benign.  Accordingly on 11/20/2016, she proceeded to biopsy of the left breast area in question. The pathology from this procedure showed ([id number]): invasive mammary carcinoma. Prognostic indicators: ER: 100% positive; PR: 5% positive; Both with strong staining intensity. Proliferation marker Ki67: 15% ; HER-2: negative.  The patient's subsequent history is as detailed below.   PAST MEDICAL HISTORY: Past Medical History:  Diagnosis Date   Anxiety    Arthritis    knees   Cancer ([place]) 11/2016   Left breast cancer   Depression    Diabetes mellitus without complication ([place])    Diverticulitis    Diverticulosis    bleeding   GERD (gastroesophageal reflux disease)    Hypertension    Hyperthyroidism    nodule on thyroid, Radioactive, now hypo   Hypothyroidism, iatrogenic    After RI now hypo on synthroid   Personal history of radiation therapy     PAST SURGICAL HISTORY: Past Surgical History:  Procedure Laterality Date   ABDOMINAL HYSTERECTOMY  1977   BLADDER SURGERY     bladder suspension   BREAST EXCISIONAL BIOPSY Right    BREAST LUMPECTOMY Left 2018   BREAST LUMPECTOMY WITH RADIOACTIVE SEED AND SENTINEL LYMPH NODE BIOPSY Left 12/11/2016   Procedure: BREAST LUMPECTOMY WITH RADIOACTIVE SEED AND SENTINEL LYMPH NODE BIOPSY;  Surgeon: Alyssa Bookbinder, MD;  Location: [place];  Service: General;  Laterality: Left;   CHOLECYSTECTOMY N/A  03/29/2013   Procedure: LAPAROSCOPIC CHOLECYSTECTOMY WITH INTRAOPERATIVE CHOLANGIOGRAM;  Surgeon: Alyssa Jolly, MD;  Location: [place];  Service: General;  Laterality: N/A;   JOINT REPLACEMENT Right 2009   total knee replacement   ROTATOR CUFF REPAIR Left    TOTAL KNEE REVISION  12/17/2010    Procedure: TOTAL KNEE REVISION;  Surgeon: [name];  Location: [facility];  Service: Orthopedics;  Laterality: Right;    FAMILY HISTORY: Family History  Problem Relation Age of Onset   Diverticulitis Mother    Alzheimer's disease Mother    Colon cancer Maternal Grandmother    Heart attack Father    Breast cancer Daughter   Her father died at age 25 from heart failure. Her mother died at age 5 from Alzheimer's. She has 1 sister and no brothers. Her maternal grandmother had colon cancer and she is unsure of what age she was diagnosed with colon cancer. Her father was diagnosed with skin cancer in his late 68's, and she notes that he didn't have melanoma. Her daughter had DCIS breast cancer diagnosed at 55. She hasn't had genetic testing completed as of yet. Her oldest daughter had genetic testing which came back normal.      GYNECOLOGIC HISTORY:  No LMP recorded. Patient has had a hysterectomy.  Menarche: 82 years old Age at first live birth: 82 years old GP: GxP3 LMP: Had a hysterectomy Contraceptive: OCP HRT: Yes, Estrogen and Progesterone combination for over 30 years.      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 She has 3 children from her first marriage: [name] age 78 who is M[employer] of a Location manager in [place]. [name] is 81 in Sales in [place]. 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 She has a granddaughter that is 60 years old.  She has 2 great-grandchildren and twins on the way (as of December 2020)              ADVANCED DIRECTIVES: Her oldest daughter, [name] is the [employer] of [place] and can be reached at [phone].    HEALTH MAINTENANCE: Social History   Tobacco Use   Smoking status: Never   Smokeless tobacco: Never  Substance Use Topics   Alcohol use: Yes    Comment: rarely    Colonoscopy:  PAP:  Bone density: at  [facility] on 12/10/2015 found a T score of -1.6  Current Outpatient Medications on File Prior to Visit  Medication Sig Dispense Refill   acetaminophen (TYLENOL) 325 MG tablet Take 1-2 tablets (325-650 mg total) by mouth every 4 (four) hours as needed for mild pain.     APIXABAN (ELIQUIS) VTE STARTER PACK ('10MG'$  AND '5MG'$ ) Take as directed on package: start with two-'5mg'$  tablets twice daily for 7 days. On day 8, switch to one-'5mg'$  tablet twice daily. 74 each 0   dapagliflozin propanediol (FARXIGA) 5 MG TABS tablet Take 5 mg by mouth daily.     diphenhydrAMINE (BENADRYL) 25 mg capsule Take 1 capsule (25 mg total) by mouth every 6 (six) hours as needed for itching. 30 capsule 0   escitalopram (LEXAPRO) 10 MG tablet Take 10 mg daily by mouth.     glimepiride (AMARYL) 2 MG tablet Take 2 mg by mouth 2 (two) times daily.     Levothyroxine Sodium 100 MCG CAPS Take 100 mcg daily by mouth.  1   metoprolol succinate (TOPROL-XL) 50 MG 24 hr tablet Take 50 mg by mouth daily.     promethazine-dextromethorphan (PROMETHAZINE-DM) 6.25-15 MG/5ML syrup Take 5 mLs by mouth every 6 (six) hours as needed for cough.     traMADol (ULTRAM) 50 MG tablet Take 50 mg by mouth every 8 (eight) hours as needed for moderate pain.     traZODone (DESYREL) 50 MG tablet Take 0.5-1 tablets (25-50 mg total) by mouth at bedtime as needed for sleep. (Patient taking differently: Take 50 mg by mouth at bedtime as needed for sleep.) 10 tablet 0   No current facility-administered medications on file prior to visit.   OBJECTIVE: White woman in no acute distress  There were no vitals filed for this visit.    There were no vitals filed for this visit.  There is no height or weight on file to calculate BMI.  Sclerae unicteric, EOMs intact Wearing a mask No cervical or supraclavicular adenopathy Lungs no rales or rhonchi Heart regular rate and rhythm Abd soft, nontender, positive bowel sounds MSK no focal spinal tenderness, no  upper extremity lymphedema Neuro: nonfocal, well oriented, appropriate affect Breasts: The right breast is unremarkable.  The left breast is status postlumpectomy and radiation.  Palpation is irregular as before but there is no significant change and no evidence of disease recurrence.  Both axillae are benign.  CBC    Component Value Date/Time   WBC 8.4 01/12/2022 0443   RBC 3.67 (L) 01/12/2022 0443   HGB 9.8 (L) 01/12/2022 0443   HGB 12.8 11/13/2021 1317   HGB 13.1 12/02/2016 1213   HCT 31.6 (L) 01/12/2022 0443   HCT 39.4 12/02/2016 1213   PLT 214 01/12/2022 0443   PLT 254 11/13/2021 1317   PLT 250 12/02/2016 1213   MCV 86.1 01/12/2022 0443   MCV 86.3 12/02/2016 1213   MCH 26.7 01/12/2022 0443   MCHC 31.0 01/12/2022 0443   RDW 15.0 01/12/2022 0443   RDW 15.1 (H) 12/02/2016 1213   LYMPHSABS 2.0 01/08/2022 1832   LYMPHSABS 2.5 12/02/2016 1213   MONOABS 0.7 01/08/2022 1832   MONOABS 0.5 12/02/2016 1213   EOSABS 0.1 01/08/2022 1832   EOSABS 0.2 12/02/2016 1213   BASOSABS 0.1 01/08/2022 1832   BASOSABS 0.1 12/02/2016 1213     STUDIES: DG CHEST PORT 1 VIEW  Result Date: 01/12/2022 CLINICAL DATA:  Pulmonary embolism, shortness of breath, CHF EXAM: PORTABLE CHEST 1 VIEW COMPARISON:  Radiograph 01/08/2022 FINDINGS: Unchanged enlarged cardiac silhouette. There is no focal airspace consolidation. There is no pleural effusion or evidence of pneumothorax. Thoracic spondylosis. No acute osseous abnormality. Bilateral shoulder degenerative changes. IMPRESSION: Unchanged enlarged cardiac silhouette.  No focal airspace disease. Electronically Signed   By: Maurine Simmering M.D.   On: 01/12/2022 14:17   NM Pulmonary Perfusion  Result Date: 01/12/2022 CLINICAL DATA:  Pulmonary embolism suspected. High probability. Shortness of breath since 05/2021. EXAM: NUCLEAR MEDICINE PERFUSION LUNG SCAN TECHNIQUE: Perfusion images were obtained in multiple projections after intravenous injection of  radiopharmaceutical. Ventilation scans intentionally deferred if perfusion scan and chest x-ray adequate for interpretation during COVID 19 epidemic. RADIOPHARMACEUTICALS:  4.4 mCi Tc-40mMAA IV COMPARISON:  No prior V/Q scan available for comparison; the most recent chest radiographs for correlation is chest two views 01/08/2022; CT chest without contrast 01/10/2022, CT chest with contrast 03/19/2020 FINDINGS: The silhouette of the heart, aortic arch, superior vena cava, and ascending great vessels appears unchanged from prior CTs, with heart size at  the upper limits of normal. There is a moderate (25-75%) apparent defect at the anterolateral aspect of the left lung, possibly within the superior segment of the lingula on LPO and LAO views. Possible small (less than 25%) defect within the approximate posterior segment of the right upper lobe. IMPRESSION: Possible "moderate" lingular and "small" posterior segment of the right upper lobe perfusion defects compared to most recent chest radiograph 01/08/2022. By Perfusion-only modified PIOPED II criteria, this quantifies to 1.5 mismatched segmental perfusion defects which unfortunately is in the "low to intermediate probability" for this perfusion only perfusion scan (nondiagnostic for pulmonary embolism). Consider correlation with lower extremity ultrasound. Electronically Signed   By: Yvonne Kendall M.D.   On: 01/12/2022 11:26   VAS [place] LOWER EXTREMITY VENOUS (DVT)  Result Date: 01/11/2022  Lower Venous DVT Study Patient Name:  REKITA MIOTKE  Date of Exam:   01/10/2022 Medical Rec #: [id number]        Accession #:    [id number] Date of Birth: 1940/05/05        Patient Gender: F Patient Age:   42 years Exam Location:  [facility] Procedure:      VAS [place] LOWER EXTREMITY VENOUS (DVT) Referring Phys: Dewaine Oats CHATTERJEE --------------------------------------------------------------------------------  Indications: Dyspnea on exertion.  Comparison Study: Previous  exam on 03/24/20 was negative for DVT Performing Technologist: Rogelia Rohrer RVT, RDMS  Examination Guidelines: A complete evaluation includes B-mode imaging, spectral Doppler, color Doppler, and power Doppler as needed of all accessible portions of each vessel. Bilateral testing is considered an integral part of a complete examination. Limited examinations for reoccurring indications may be performed as noted. The reflux portion of the exam is performed with the patient in reverse Trendelenburg.  +---------+---------------+---------+-----------+----------+------------------+ RIGHT    CompressibilityPhasicitySpontaneityPropertiesThrombus Aging     +---------+---------------+---------+-----------+----------+------------------+ CFV      Full           Yes      Yes                                     +---------+---------------+---------+-----------+----------+------------------+ SFJ      Full                                                            +---------+---------------+---------+-----------+----------+------------------+ FV Prox  Full           Yes      Yes                                     +---------+---------------+---------+-----------+----------+------------------+ FV Mid   Full           Yes      Yes                                     +---------+---------------+---------+-----------+----------+------------------+ FV DistalFull           Yes      Yes                                     +---------+---------------+---------+-----------+----------+------------------+  PFV      Full                                                            +---------+---------------+---------+-----------+----------+------------------+ POP      Full           Yes      Yes                                     +---------+---------------+---------+-----------+----------+------------------+ PTV      None           No       No                   Acute - one of                                                            paired             +---------+---------------+---------+-----------+----------+------------------+ PERO     Full                                         Not well                                                                 visualized         +---------+---------------+---------+-----------+----------+------------------+   +---------+---------------+---------+-----------+----------+------------------+ LEFT     CompressibilityPhasicitySpontaneityPropertiesThrombus Aging     +---------+---------------+---------+-----------+----------+------------------+ CFV      Full           Yes      Yes                                     +---------+---------------+---------+-----------+----------+------------------+ SFJ      Full                                                            +---------+---------------+---------+-----------+----------+------------------+ FV Prox  Full           Yes      Yes                                     +---------+---------------+---------+-----------+----------+------------------+ FV Mid   Full           Yes      Yes                                     +---------+---------------+---------+-----------+----------+------------------+  FV DistalFull           Yes      Yes                                     +---------+---------------+---------+-----------+----------+------------------+ PFV      Full                                                            +---------+---------------+---------+-----------+----------+------------------+ POP      Full           Yes      Yes                                     +---------+---------------+---------+-----------+----------+------------------+ PTV      None           No       No                   Acute - one of                                                           paired              +---------+---------------+---------+-----------+----------+------------------+ PERO     None           No       No                   Acute              +---------+---------------+---------+-----------+----------+------------------+     Summary: RIGHT: - Findings consistent with acute deep vein thrombosis involving the one of the right posterior tibial veins. - No cystic structure found in the popliteal fossa.  LEFT: - Findings consistent with acute deep vein thrombosis involving the left peroneal veins, and one of the left posterior tibial veins. - A cystic structure is found in the popliteal fossa (5.7 x 1.1 x 2.3 cm).  *See table(s) above for measurements and observations. Electronically signed by Jamelle Haring on 01/11/2022 at 11:15:05 AM.    Final    CT CHEST WO CONTRAST  Result Date: 01/10/2022 CLINICAL DATA:  Dyspnea, chronic, unclear etiology EXAM: CT CHEST WITHOUT CONTRAST TECHNIQUE: Multidetector CT imaging of the chest was performed following the standard protocol without IV contrast. RADIATION DOSE REDUCTION: This exam was performed according to the departmental dose-optimization program which includes automated exposure control, adjustment of the mA and/or kV according to patient size and/or use of iterative reconstruction technique. COMPARISON:  Chest x-ray 01/08/2022, chest CT 03/19/2020 FINDINGS: Cardiovascular: Heart size is normal. No pericardial effusion. Thoracic aorta is nonaneurysmal. Scattered atherosclerotic vascular calcifications of the aorta and coronary arteries. Main pulmonary artery measures 3.3 cm in diameter. Mediastinum/Nodes: No enlarged mediastinal or axillary lymph nodes. Thyroid gland, trachea, and esophagus demonstrate no significant findings. Lungs/Pleura: Small nodular density within the infrahilar aspect of the left lower lobe with surrounding ground-glass attenuation.  The central solid component measures up to 8 mm in diameter (series 5, image 106; series 7,  image 84). Subtle ground-glass opacity within the right perihilar region. Chronic linear scarring in the anterior aspect of the left upper lobe. No pleural effusion or pneumothorax. Upper Abdomen: Atrophic left kidney containing nonobstructing stones. No acute findings. Musculoskeletal: Exaggerated thoracic kyphosis with flowing anterior endplate osteophytes compatible with diffuse idiopathic skeletal hyperostosis. No acute bony findings. No suspicious bone lesion. No chest wall abnormality. IMPRESSION: 1. Small nodular density within the infrahilar aspect of the left lower lobe with surrounding ground-glass attenuation. The central solid component measures up to 8 mm in diameter. Findings may represent an infectious or inflammatory process. Neoplasm not excluded. Follow-up non-contrast CT recommended at 3-6 months to confirm persistence. If unchanged, and solid component remains <6 mm, annual CT is recommended until 5 years of stability has been established. If persistent these nodules should be considered highly suspicious if the solid component of the nodule is 6 mm or greater in size and enlarging. This recommendation follows the consensus statement: Guidelines for Management of Incidental Pulmonary Nodules Detected on CT Images: From the Fleischner Society 2017; Radiology 2017; 284:228-243. 2. Subtle ground-glass opacity within the right perihilar region, which may also represent an infectious or inflammatory process or site of air trapping. 3. Aortic and coronary artery atherosclerosis (ICD10-I70.0). Electronically Signed   By: Davina Poke D.O.   On: 01/10/2022 16:14   ECHOCARDIOGRAM COMPLETE  Result Date: 01/09/2022    ECHOCARDIOGRAM REPORT   Patient Name:   KEENAN DIMITROV Date of Exam: 01/09/2022 Medical Rec #:  [id number]       Height:       62.0 in Accession #:    [id number]      Weight:       177.0 lb Date of Birth:  27-Apr-1940       BSA:          1.815 m Patient Age:    14 years        BP:            101/50 mmHg Patient Gender: F               HR:           92 bpm. Exam Location:  Inpatient Procedure: 2D Echo, Cardiac Doppler, Color Doppler and Intracardiac            Opacification Agent  Results communicated to Dr Jamse Arn at 1656 on 01/09/22. Indications:    CHF-Acute Diastolic O27.03  History:        Patient has no prior history of Echocardiogram examinations.                 Risk Factors:Hypertension, GERD and Diabetes.  Sonographer:    Bernadene Person RDCS Referring Phys: [id number] [place]  1. Right ventricular systolic function is moderately reduced. The right ventricular size is mildly enlarged. There is moderately elevated pulmonary artery systolic pressure. The estimated right ventricular systolic pressure is 82.9 mmHg. There is free wall hypokinesis with normal motion at apex, consistent with McConnell's sign, which can be seen in PE. Recommend ruling out PE  2. Left ventricular ejection fraction, by estimation, is 70 to 75%. The left ventricle has hyperdynamic function. The left ventricle has no regional wall motion abnormalities. There is mild left ventricular hypertrophy. Left ventricular diastolic parameters are consistent with Grade I diastolic dysfunction (impaired relaxation). There is the interventricular septum is flattened  in systole and diastole, consistent with right ventricular pressure and volume overload.  3. The mitral valve is normal in structure. No evidence of mitral valve regurgitation.  4. Tricuspid valve regurgitation is mild to moderate.  5. The aortic valve is tricuspid. Aortic valve regurgitation is not visualized. No aortic stenosis is present.  6. The inferior vena cava is normal in size with greater than 50% respiratory variability, suggesting right atrial pressure of 3 mmHg. FINDINGS  Left Ventricle: Left ventricular ejection fraction, by estimation, is 70 to 75%. The left ventricle has hyperdynamic function. The left ventricle has no regional wall  motion abnormalities. Definity contrast agent was given IV to delineate the left ventricular endocardial borders. The left ventricular internal cavity size was small. There is mild left ventricular hypertrophy. The interventricular septum is flattened in systole and diastole, consistent with right ventricular pressure and volume overload. Left ventricular diastolic parameters are consistent with Grade I diastolic dysfunction (impaired relaxation). Right Ventricle: The right ventricular size is mildly enlarged. Right vetricular wall thickness was not well visualized. Right ventricular systolic function is moderately reduced. There is moderately elevated pulmonary artery systolic pressure. The tricuspid regurgitant velocity is 3.28 m/s, and with an assumed right atrial pressure of 3 mmHg, the estimated right ventricular systolic pressure is 02.7 mmHg. Left Atrium: Left atrial size was normal in size. Right Atrium: Right atrial size was normal in size. Pericardium: There is no evidence of pericardial effusion. Mitral Valve: The mitral valve is normal in structure. No evidence of mitral valve regurgitation. Tricuspid Valve: The tricuspid valve is normal in structure. Tricuspid valve regurgitation is mild to moderate. Aortic Valve: The aortic valve is tricuspid. Aortic valve regurgitation is not visualized. No aortic stenosis is present. Pulmonic Valve: The pulmonic valve was not well visualized. Pulmonic valve regurgitation is trivial. Aorta: The aortic root and ascending aorta are structurally normal, with no evidence of dilitation. Venous: The inferior vena cava is normal in size with greater than 50% respiratory variability, suggesting right atrial pressure of 3 mmHg. IAS/Shunts: The interatrial septum was not well visualized.  LEFT VENTRICLE PLAX 2D LVIDd:         3.60 cm     Diastology LVIDs:         2.20 cm     LV e' medial:    4.88 cm/s LV PW:         1.10 cm     LV E/e' medial:  9.8 LV IVS:        1.00 cm     LV  e' lateral:   5.55 cm/s LVOT diam:     1.90 cm     LV E/e' lateral: 8.6 LV SV:         54 LV SV Index:   30 LVOT Area:     2.84 cm  LV Volumes (MOD) LV vol d, MOD A2C: 38.3 ml LV vol d, MOD A4C: 33.4 ml LV vol s, MOD A2C: 8.8 ml LV vol s, MOD A4C: 9.3 ml LV SV MOD A2C:     29.5 ml LV SV MOD A4C:     33.4 ml LV SV MOD BP:      26.2 ml RIGHT VENTRICLE RV S prime:     9.38 cm/s TAPSE (M-mode): 1.6 cm LEFT ATRIUM             Index        RIGHT ATRIUM           Index LA diam:  2.90 cm 1.60 cm/m   RA Area:     14.40 cm LA Vol (A2C):   43.8 ml 24.13 ml/m  RA Volume:   36.00 ml  19.83 ml/m LA Vol (A4C):   39.0 ml 21.49 ml/m LA Biplane Vol: 44.8 ml 24.68 ml/m  AORTIC VALVE LVOT Vmax:   107.00 cm/s LVOT Vmean:  70.400 cm/s LVOT VTI:    0.191 m  AORTA Ao Root diam: 3.20 cm Ao Asc diam:  3.20 cm MITRAL VALVE               TRICUSPID VALVE MV Area (PHT): 4.17 cm    TR Peak grad:   43.0 mmHg MV Decel Time: 182 msec    TR Vmax:        328.00 cm/s MV E velocity: 48.00 cm/s MV A velocity: 86.80 cm/s  SHUNTS MV E/A ratio:  0.55        Systemic VTI:  0.19 m                            Systemic Diam: 1.90 cm Oswaldo Milian MD Electronically signed by Oswaldo Milian MD Signature Date/Time: 01/09/2022/5:01:58 PM    Final    US RENAL  Result Date: 01/09/2022 CLINICAL DATA:  Acute kidney injury EXAM: RENAL / URINARY TRACT ULTRASOUND COMPLETE COMPARISON:  CT 03/19/2020 FINDINGS: Right Kidney: Renal measurements: 11.3 x 4.6 x 4.7 cm = volume: 129.8 mL. There is a 1.9 cm simple cyst in the lower pole. No hydronephrosis. Mildly increased renal cortical echogenicity. Left Kidney: Renal measurements: 11.6 x 3.7 x 5.9 cm = volume: 75.1 mL. There is a 2.7 cm cystic lesion in the upper pole corresponding to a simple cyst seen on prior CT in February 2022. Increased cortical echogenicity. Bladder: Appears normal for degree of bladder distention. Other: None. IMPRESSION: No hydronephrosis. Findings of medical renal  disease bilaterally with left renal atrophy. Electronically Signed   By: Maurine Simmering M.D.   On: 01/09/2022 16:39   DG Chest 2 View  Result Date: 01/08/2022 CLINICAL DATA:  Difficulty in breathing. Shortness of breath for 10 days. EXAM: CHEST - 2 VIEW COMPARISON:  03/19/2020 FINDINGS: Normal heart size and pulmonary vascularity. No focal airspace disease or consolidation in the lungs. No blunting of costophrenic angles. No pneumothorax. Mediastinal contours appear intact. Degenerative changes in the spine. IMPRESSION: No active cardiopulmonary disease. Electronically Signed   By: Lucienne Capers M.D.   On: 01/08/2022 19:10     ELIGIBLE FOR AVAILABLE RESEARCH PROTOCOL: no   ASSESSMENT: 82 y.o. [place] woman s/p left breast upper outer quadrant biopsy 11/20/2016 for a clinical T1c N0, stage IA invasive ductal carcinoma, grade 1, estrogen and progesterone receptor positive, HER-2 not amplified, with an Mib-1 of 15%  (1) s/p left lumpectomy with sentinel lymph node sampling 12/11/2016 for a pT1c pN0 invasive ductal carcinoma, grade 2, with negative margins  (2) oncotype score of 24 predicted a 10-year risk of recurrence outside the breast of 16% if the patient's only systemic therapy was tamoxifen for 5 years.  It also predicted no significant benefit from chemotherapy.  (3) adjuvant radiation 01/27/2017 - 02/23/2017  Site/dose:   The patient initially received a dose of 42.5 Gy in 17 fractions to the breast using whole-breast tangent fields. This was delivered using a 3-D conformal technique. The patient then received a boost to the seroma. This delivered an additional 7.5 Gy in 3 fractions using a 3 field  photon technique due to the depth of the seroma. The total dose was 50 Gy.   (4) anastrozole started 03/16/2017  (a) bone density at [facility] 12/10/2015 found a T score of -1.6  (b) repeat bone density 12/15/2017 shows a T score of -1.6 (unchanged).  (c) off anastrozole January 2020  through May 2020 (patient did not refill prescription)  (5) When I last saw her in Oct 2023, we discussed about graduating from Hem/Onc. She since then had a PE and is currently on anticoagulation.  PLAN:   Total time spent: 30 minutes  *Total Encounter Time as defined by the Centers for Medicare and Medicaid Services includes, in addition to the face-to-face time of a patient visit (documented in the note above) non-face-to-face time: obtaining and reviewing outside history, ordering and reviewing medications, tests or procedures, care coordination (communications with other health care professionals or caregivers) and documentation in the medical record.

## 2022-02-03 ENCOUNTER — Inpatient Hospital Stay: Payer: PPO | Admitting: Hematology and Oncology

## 2022-02-06 ENCOUNTER — Encounter: Payer: Self-pay | Admitting: Internal Medicine

## 2022-02-06 ENCOUNTER — Ambulatory Visit: Payer: PPO | Admitting: Internal Medicine

## 2022-02-06 VITALS — BP 137/64 | HR 81 | Ht 62.0 in | Wt 172.2 lb

## 2022-02-06 DIAGNOSIS — I2609 Other pulmonary embolism with acute cor pulmonale: Secondary | ICD-10-CM | POA: Diagnosis not present

## 2022-02-06 DIAGNOSIS — I50811 Acute right heart failure: Secondary | ICD-10-CM | POA: Diagnosis not present

## 2022-02-06 DIAGNOSIS — I10 Essential (primary) hypertension: Secondary | ICD-10-CM

## 2022-02-06 NOTE — Progress Notes (Signed)
Primary Physician/Referring:  [name], MD  [name], female    DOB: 1940-09-16, 83 y.o.   MRN: [id number]  Chief Complaint  Patient presents with  . Acute right-sided congestive heart failure  . New Patient (Initial Visit)   HPI:    [name]  is a 82 y.o. ***  Past Medical History:  Diagnosis Date  . Anxiety   . Arthritis    knees  . Cancer ([place]) 11/2016   Left breast cancer  . Depression   . Diabetes mellitus without complication ([place])   . Diverticulitis   . Diverticulosis    bleeding  . GERD (gastroesophageal reflux disease)   . Hypertension   . Hyperthyroidism    nodule on thyroid, Radioactive, now hypo  . Hypothyroidism, iatrogenic    After RI now hypo on synthroid  . Personal history of radiation therapy    Past Surgical History:  Procedure Laterality Date  . ABDOMINAL HYSTERECTOMY  1977  . BLADDER SURGERY     bladder suspension  . BREAST EXCISIONAL BIOPSY Right   . BREAST LUMPECTOMY Left 2018  . BREAST LUMPECTOMY WITH RADIOACTIVE SEED AND SENTINEL LYMPH NODE BIOPSY Left 12/11/2016   Procedure: BREAST LUMPECTOMY WITH RADIOACTIVE SEED AND SENTINEL LYMPH NODE BIOPSY;  Surgeon: Rolm Bookbinder, MD;  Location: [facility];  Service: General;  Laterality: Left;  . CHOLECYSTECTOMY N/A 03/29/2013   Procedure: LAPAROSCOPIC CHOLECYSTECTOMY WITH INTRAOPERATIVE CHOLANGIOGRAM;  Surgeon: Edward Jolly, MD;  Location: [facility];  Service: General;  Laterality: N/A;  . JOINT REPLACEMENT Right 2009   total knee replacement  . ROTATOR CUFF REPAIR Left   . TOTAL KNEE REVISION  12/17/2010   Procedure: TOTAL KNEE REVISION;  Surgeon: Dione Plover Aluisio;  Location: [facility];  Service: Orthopedics;  Laterality: Right;   Family History  Problem Relation Age of Onset  . Diverticulitis Mother   . Alzheimer's disease Mother   . Colon cancer Maternal Grandmother   . Heart attack Father   . Breast cancer Daughter     Social History    Tobacco Use  . Smoking status: Never  . Smokeless tobacco: Never  Substance Use Topics  . Alcohol use: Yes    Comment: rarely   Marital Status: Married  ROS  ***ROS Objective  Blood pressure 137/64, pulse 81, height '5\' 2"'$  (1.575 m), weight 172 lb 3.2 oz (78.1 kg), SpO2 97 %. Body mass index is 31.5 kg/m.     02/06/2022   12:54 PM 02/06/2022   12:52 PM 01/12/2022   12:25 PM  Vitals with BMI  Height  '5\' 2"'$    Weight  172 lbs 3 oz   BMI  00.76   Systolic 226 333 545  Diastolic 64 72 67  Pulse 81 82 81     ***Physical Exam  Medications and allergies   Allergies  Allergen Reactions  . Amitriptyline Other (See Comments)    hallucinations  . Aspirin Other (See Comments)    Bleeding  . Nsaids Other (See Comments)    Lower GI bleeding  . Meperidine Hcl Other (See Comments)    Causes migraines Other reaction(s): migraine  . Morphine And Related Rash  . Morphine Sulfate Rash    Other reaction(s): rash     Medication list after today's encounter   Current Outpatient Medications:  .  acetaminophen (TYLENOL) 325 MG tablet, Take 1-2 tablets (325-650 mg total) by mouth every 4 (four) hours as needed for mild pain., Disp: ,  Rfl:  .  albuterol (VENTOLIN HFA) 108 (90 Base) MCG/ACT inhaler, Inhale 2 puffs into the lungs every 6 (six) hours as needed., Disp: , Rfl:  .  amLODipine (NORVASC) 5 MG tablet, Take 5 mg by mouth daily., Disp: , Rfl:  .  apixaban (ELIQUIS) 5 MG TABS tablet, Take 5 mg by mouth 2 (two) times daily., Disp: , Rfl:  .  dapagliflozin propanediol (FARXIGA) 5 MG TABS tablet, Take 5 mg by mouth daily., Disp: , Rfl:  .  diphenhydrAMINE (BENADRYL) 25 mg capsule, Take 1 capsule (25 mg total) by mouth every 6 (six) hours as needed for itching., Disp: 30 capsule, Rfl: 0 .  escitalopram (LEXAPRO) 10 MG tablet, Take 10 mg daily by mouth., Disp: , Rfl:  .  glimepiride (AMARYL) 2 MG tablet, Take 2 mg by mouth 2 (two) times daily., Disp: , Rfl:  .  Levothyroxine Sodium 100  MCG CAPS, Take 100 mcg daily by mouth. , Disp: , Rfl: 1 .  metoprolol succinate (TOPROL-XL) 50 MG 24 hr tablet, Take 50 mg by mouth daily., Disp: , Rfl:  .  promethazine-dextromethorphan (PROMETHAZINE-DM) 6.25-15 MG/5ML syrup, Take 5 mLs by mouth every 6 (six) hours as needed for cough., Disp: , Rfl:  .  traMADol (ULTRAM) 50 MG tablet, Take 50 mg by mouth every 8 (eight) hours as needed for moderate pain., Disp: , Rfl:  .  traZODone (DESYREL) 50 MG tablet, Take 0.5-1 tablets (25-50 mg total) by mouth at bedtime as needed for sleep. (Patient taking differently: Take 50 mg by mouth at bedtime as needed for sleep.), Disp: 10 tablet, Rfl: 0  Laboratory examination:   Lab Results  Component Value Date   NA 138 01/10/2022   K 3.4 (L) 01/10/2022   CO2 21 (L) 01/10/2022   GLUCOSE 129 (H) 01/10/2022   BUN 19 01/10/2022   CREATININE 1.82 (H) 01/10/2022   CALCIUM 8.5 (L) 01/10/2022   EGFR 48 (L) 12/02/2016   GFRNONAA 28 (L) 01/10/2022       Latest Ref Rng & Units 01/10/2022    4:53 AM 01/09/2022    3:55 PM 01/08/2022    6:32 PM  CMP  Glucose 70 - 99 mg/dL 129  123  206   BUN 8 - 23 mg/dL '19  23  30   '$ Creatinine 0.44 - 1.00 mg/dL 1.82  1.77  2.42   Sodium 135 - 145 mmol/L 138  138  134   Potassium 3.5 - 5.1 mmol/L 3.4  3.8  2.9   Chloride 98 - 111 mmol/L 107  108  101   CO2 22 - 32 mmol/L '21  20  18   '$ Calcium 8.9 - 10.3 mg/dL 8.5  9.0  8.8   Total Protein 6.5 - 8.1 g/dL 5.8     Total Bilirubin 0.3 - 1.2 mg/dL 0.6     Alkaline Phos 38 - 126 U/L 70     AST 15 - 41 U/L 15     ALT 0 - 44 U/L 17         Latest Ref Rng & Units 01/12/2022    4:43 AM 01/11/2022    4:23 AM 01/10/2022    4:53 AM  CBC  WBC 4.0 - 10.5 K/uL 8.4  8.3  7.4   Hemoglobin 12.0 - 15.0 g/dL 9.8  9.9  9.9   Hematocrit 36.0 - 46.0 % 31.6  31.8  31.8   Platelets 150 - 400 K/uL 214  223  224  Lipid Panel No results for input(s): "CHOL", "TRIG", "[place]", "VLDL", "HDL", "CHOLHDL", "LDLDIRECT" in the last 8760  hours.  HEMOGLOBIN A1C Lab Results  Component Value Date   HGBA1C 9.0 (H) 01/09/2022   MPG 212 01/09/2022   TSH Recent Labs    01/08/22 2044  TSH 3.549    External labs:     Radiology:    Cardiac Studies:   ECHO COMPLETE WITH IMAGING ENHANCING AGENT 01/09/2022  1. Right ventricular systolic function is moderately reduced. The right ventricular size is mildly enlarged. There is moderately elevated pulmonary artery systolic pressure. The estimated right ventricular systolic pressure is 92.4 mmHg. There is free wall hypokinesis with normal motion at apex, consistent with McConnell's sign, which can be seen in PE. Recommend ruling out PE 2. Left ventricular ejection fraction, by estimation, is 70 to 75%. The left ventricle has hyperdynamic function. The left ventricle has no regional wall motion abnormalities. There is mild left ventricular hypertrophy. Left ventricular diastolic parameters are consistent with Grade I diastolic dysfunction (impaired relaxation). There is the interventricular septum is flattened in systole and diastole, consistent with right ventricular pressure and volume overload. 3. The mitral valve is normal in structure. No evidence of mitral valve regurgitation. 4. Tricuspid valve regurgitation is mild to moderate. 5. The aortic valve is tricuspid. Aortic valve regurgitation is not visualized. No aortic stenosis is present. 6. The inferior vena cava is normal in size with greater than 50% respiratory variability, suggesting right atrial pressure of 3 mmHg.    LE Venous duplex 01/10/2022 Summary:  RIGHT:  - Findings consistent with acute deep vein thrombosis involving the one of the right posterior tibial veins.  LEFT:  - Findings consistent with acute deep vein thrombosis involving the left peroneal veins, and one of the left posterior tibial veins.  - A cystic structure is found in the popliteal fossa (5.7 x 1.1 x 2.3 cm).    EKG:     Assessment      ICD-10-CM   1. Acute right-sided congestive heart failure (HCC)  I50.811 EKG 12-Lead    2. Essential hypertension  I10     3. Acute pulmonary embolism with acute cor pulmonale, unspecified pulmonary embolism type ([place])  I26.09        Orders Placed This Encounter  Procedures  . EKG 12-Lead    No orders of the defined types were placed in this encounter.   Medications Discontinued During This Encounter  Medication Reason  . APIXABAN (ELIQUIS) VTE STARTER PACK ('10MG'$  AND '5MG'$ ) Dose change     Recommendations:   [name] is a 82 y.o.  ***    Floydene Flock, DO, [facility]  02/06/2022, 1:02 PM Office: [phone] Pager: [phone]

## 2022-02-11 DIAGNOSIS — U071 COVID-19: Secondary | ICD-10-CM | POA: Diagnosis not present

## 2022-02-11 DIAGNOSIS — N1832 Chronic kidney disease, stage 3b: Secondary | ICD-10-CM | POA: Diagnosis not present

## 2022-02-11 DIAGNOSIS — E1169 Type 2 diabetes mellitus with other specified complication: Secondary | ICD-10-CM | POA: Diagnosis not present

## 2022-02-11 DIAGNOSIS — I129 Hypertensive chronic kidney disease with stage 1 through stage 4 chronic kidney disease, or unspecified chronic kidney disease: Secondary | ICD-10-CM | POA: Diagnosis not present

## 2022-02-12 ENCOUNTER — Inpatient Hospital Stay: Payer: PPO | Admitting: Hematology and Oncology

## 2022-02-16 NOTE — Progress Notes (Signed)
No show

## 2022-02-25 DIAGNOSIS — I129 Hypertensive chronic kidney disease with stage 1 through stage 4 chronic kidney disease, or unspecified chronic kidney disease: Secondary | ICD-10-CM | POA: Diagnosis not present

## 2022-02-25 DIAGNOSIS — R1032 Left lower quadrant pain: Secondary | ICD-10-CM | POA: Diagnosis not present

## 2022-02-25 DIAGNOSIS — K921 Melena: Secondary | ICD-10-CM | POA: Diagnosis not present

## 2022-02-25 DIAGNOSIS — N1832 Chronic kidney disease, stage 3b: Secondary | ICD-10-CM | POA: Diagnosis not present

## 2022-02-25 DIAGNOSIS — E785 Hyperlipidemia, unspecified: Secondary | ICD-10-CM | POA: Diagnosis not present

## 2022-02-25 DIAGNOSIS — D649 Anemia, unspecified: Secondary | ICD-10-CM | POA: Diagnosis not present

## 2022-03-04 ENCOUNTER — Inpatient Hospital Stay: Payer: PPO | Attending: Hematology and Oncology | Admitting: Hematology and Oncology

## 2022-03-04 ENCOUNTER — Other Ambulatory Visit: Payer: Self-pay

## 2022-03-04 ENCOUNTER — Ambulatory Visit (HOSPITAL_COMMUNITY)
Admission: RE | Admit: 2022-03-04 | Discharge: 2022-03-04 | Disposition: A | Discharge status: Home / Self Care | Payer: PPO | Source: Ambulatory Visit | Attending: Hematology and Oncology | Admitting: Hematology and Oncology

## 2022-03-04 ENCOUNTER — Telehealth: Payer: Self-pay | Admitting: Hematology and Oncology

## 2022-03-04 VITALS — BP 130/49 | HR 88 | Temp 97.8°F | Resp 16 | Ht 62.0 in | Wt 172.6 lb

## 2022-03-04 DIAGNOSIS — C50412 Malignant neoplasm of upper-outer quadrant of left female breast: Secondary | ICD-10-CM | POA: Diagnosis not present

## 2022-03-04 DIAGNOSIS — R109 Unspecified abdominal pain: Secondary | ICD-10-CM | POA: Insufficient documentation

## 2022-03-04 DIAGNOSIS — Z79811 Long term (current) use of aromatase inhibitors: Secondary | ICD-10-CM | POA: Diagnosis not present

## 2022-03-04 DIAGNOSIS — Z7901 Long term (current) use of anticoagulants: Secondary | ICD-10-CM | POA: Insufficient documentation

## 2022-03-04 DIAGNOSIS — M47816 Spondylosis without myelopathy or radiculopathy, lumbar region: Secondary | ICD-10-CM | POA: Diagnosis not present

## 2022-03-04 DIAGNOSIS — R1084 Generalized abdominal pain: Secondary | ICD-10-CM | POA: Insufficient documentation

## 2022-03-04 DIAGNOSIS — R10819 Abdominal tenderness, unspecified site: Secondary | ICD-10-CM | POA: Diagnosis not present

## 2022-03-04 DIAGNOSIS — I878 Other specified disorders of veins: Secondary | ICD-10-CM | POA: Diagnosis not present

## 2022-03-04 DIAGNOSIS — Z170 Estrogen receptor positive status [ER+]: Secondary | ICD-10-CM | POA: Diagnosis not present

## 2022-03-04 DIAGNOSIS — Z9049 Acquired absence of other specified parts of digestive tract: Secondary | ICD-10-CM | POA: Diagnosis not present

## 2022-03-04 NOTE — Telephone Encounter (Signed)
Left patient a vm regarding upcoming appointments  

## 2022-03-04 NOTE — Progress Notes (Unsigned)
[name]  [phone] [phone]   NB: Patient did not show for he 03/15/2017 appt  ID: [name] DOB: 06-21-1940  MR#: [id number]  HYI#:[id number]  Patient Care Team: [name], MD as PCP - General (Family Medicine) Magrinat, Virgie Dad, MD (Inactive) as Consulting Physician (Oncology) Roseanne Kaufman, MD as Consulting Physician (Orthopedic Surgery) Juanita Craver, MD as Consulting Physician (Gastroenterology) Jari Pigg, MD as Consulting Physician (Dermatology) Kathie Rhodes, MD (Inactive) as Consulting Physician (Urology) Kyung Rudd, MD as Consulting Physician (Radiation Oncology) Rolm Bookbinder, MD as Consulting Physician (General Surgery) OTHER MD:  CHIEF COMPLAINT: estrogen receptor positive breast cancer  CURRENT TREATMENT: Anastrozole   INTERVAL HISTORY:  [name] returns today for follow-up since her last hospitalization. She had DVT and PE and was admitted with the same, she had COVID at the same time. She denies any new breast changes. She however has seen some blood in stool, following up with GI Dr Collene Mares. Rest of the pertinent 10 point ROS reviewed and negative.    COVID 19 VACCINATION STATUS: Had Moderna vaccine x2 then had Covid October 2021, did receive antibiotic infusion; had COVID again subsequently and again received an infusion   HISTORY OF CURRENT ILLNESS:  From the original intake note:  [name] initially palpated a lump to her left breast while watching TV which she brought to medical attention and was evaluated 1 week following by her PCP, Dr. Coralyn Mark. She then had an unilateral diagnostic left mammography with tomography and CAD and left breast ultrasonography at [facility] on 11/17/2016 showing a 1.6 cm mass in the upper left breast suspicous for malignancy. The left axilla was benign.  Accordingly on 11/20/2016, she proceeded to biopsy of the left breast area in question. The pathology from this  procedure showed ([id number]): invasive mammary carcinoma. Prognostic indicators: ER: 100% positive; PR: 5% positive; Both with strong staining intensity. Proliferation marker Ki67: 15% ; HER-2: negative.  The patient's subsequent history is as detailed below.   PAST MEDICAL HISTORY: Past Medical History:  Diagnosis Date   Anxiety    Arthritis    knees   Cancer ([place]) 11/2016   Left breast cancer   Depression    Diabetes mellitus without complication ([place])    Diverticulitis    Diverticulosis    bleeding   GERD (gastroesophageal reflux disease)    Hypertension    Hyperthyroidism    nodule on thyroid, Radioactive, now hypo   Hypothyroidism, iatrogenic    After RI now hypo on synthroid   Personal history of radiation therapy     PAST SURGICAL HISTORY: Past Surgical History:  Procedure Laterality Date   ABDOMINAL HYSTERECTOMY  1977   BLADDER SURGERY     bladder suspension   BREAST EXCISIONAL BIOPSY Right    BREAST LUMPECTOMY Left 2018   BREAST LUMPECTOMY WITH RADIOACTIVE SEED AND SENTINEL LYMPH NODE BIOPSY Left 12/11/2016   Procedure: BREAST LUMPECTOMY WITH RADIOACTIVE SEED AND SENTINEL LYMPH NODE BIOPSY;  Surgeon: Rolm Bookbinder, MD;  Location: [facility];  Service: General;  Laterality: Left;   CHOLECYSTECTOMY N/A 03/29/2013   Procedure: LAPAROSCOPIC CHOLECYSTECTOMY WITH INTRAOPERATIVE CHOLANGIOGRAM;  Surgeon: Edward Jolly, MD;  Location: [facility];  Service: General;  Laterality: N/A;   JOINT REPLACEMENT Right 2009   total knee replacement   ROTATOR CUFF REPAIR Left    TOTAL KNEE REVISION  12/17/2010   Procedure: TOTAL KNEE REVISION;  Surgeon: Dione Plover Aluisio;  [facility];  Service: Orthopedics;  Laterality: Right;    FAMILY HISTORY: Family History  Problem Relation Age of Onset   Diverticulitis Mother    Alzheimer's disease Mother    Colon cancer Maternal Grandmother    Heart attack Father    Breast cancer Daughter   Her father died at  age 73 from heart failure. Her mother died at age 26 from Alzheimer's. She has 1 sister and no brothers. Her maternal grandmother had colon cancer and she is unsure of what age she was diagnosed with colon cancer. Her father was diagnosed with skin cancer in his late 66's, and she notes that he didn't have melanoma. Her daughter had DCIS breast cancer diagnosed at 55. She hasn't had genetic testing completed as of yet. Her oldest daughter had genetic testing which came back normal.      GYNECOLOGIC HISTORY:  No LMP recorded. Patient has had a hysterectomy.  Menarche: 82 years old Age at first live birth: 82 years old GP: GxP3 LMP: Had a hysterectomy Contraceptive: OCP HRT: Yes, Estrogen and Progesterone combination for over 30 years.      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 She has 3 children from her first marriage: [name] age 41 who is Mud[employer] of a Location manager in [place]. [name] is 96 in Sales in [place]. [name] is 67 and is a 5th grade teacher in [place]. She has a granddaughter that is 32 years old.  She has 2 great-grandchildren and twins on the way (as of December 2020)              ADVANCED DIRECTIVES: Her oldest daughter, [name] is the [employer] of [place] and can be reached at [phone].    HEALTH MAINTENANCE: Social History   Tobacco Use   Smoking status: Never   Smokeless tobacco: Never  Substance Use Topics   Alcohol use: Yes    Comment: rarely    Colonoscopy:  PAP:  Bone density: at [facility] on 12/10/2015 found a T score of -1.6  Current Outpatient Medications on File Prior to Visit  Medication Sig Dispense Refill   acetaminophen (TYLENOL) 325 MG tablet Take 1-2 tablets (325-650 mg total) by mouth every 4 (four) hours as needed for mild pain.     albuterol (VENTOLIN HFA) 108 (90 Base) MCG/ACT inhaler Inhale 2 puffs into the  lungs every 6 (six) hours as needed.     amLODipine (NORVASC) 5 MG tablet Take 5 mg by mouth daily.     apixaban (ELIQUIS) 5 MG TABS tablet Take 5 mg by mouth 2 (two) times daily.     dapagliflozin propanediol (FARXIGA) 5 MG TABS tablet Take 5 mg by mouth daily.     diphenhydrAMINE (BENADRYL) 25 mg capsule Take 1 capsule (25 mg total) by mouth every 6 (six) hours as needed for itching. 30 capsule 0   escitalopram (LEXAPRO) 10 MG tablet Take 10 mg daily by mouth.     glimepiride (AMARYL) 2 MG tablet Take 2 mg by mouth 2 (two) times daily.     Levothyroxine Sodium 100 MCG CAPS Take 100 mcg daily by mouth.   1   metoprolol succinate (TOPROL-XL) 50 MG 24 hr tablet Take 50 mg by mouth daily.     promethazine-dextromethorphan (PROMETHAZINE-DM) 6.25-15 MG/5ML syrup Take 5 mLs by mouth every 6 (six) hours as needed for cough.     traMADol (ULTRAM) 50 MG  tablet Take 50 mg by mouth every 8 (eight) hours as needed for moderate pain.     traZODone (DESYREL) 50 MG tablet Take 0.5-1 tablets (25-50 mg total) by mouth at bedtime as needed for sleep. (Patient taking differently: Take 50 mg by mouth at bedtime as needed for sleep.) 10 tablet 0   No current facility-administered medications on file prior to visit.   OBJECTIVE: White woman in no acute distress  Vitals:   03/04/22 1319  BP: (!) 130/49  Pulse: 88  Resp: 16  Temp: 97.8 F (36.6 C)  SpO2: 96%     Filed Weights   03/04/22 1319  Weight: 172 lb 9.6 oz (78.3 kg)   Body mass index is 31.57 kg/m.  Sclerae unicteric, EOMs intact Wearing a mask No cervical or supraclavicular adenopathy Lungs no rales or rhonchi Heart regular rate and rhythm Abd soft, nontender, positive bowel sounds MSK no focal spinal tenderness, no upper extremity lymphedema Neuro: nonfocal, well oriented, appropriate affect Breasts: The right breast is unremarkable.  The left breast is status postlumpectomy and radiation.  Palpation is irregular as before but there is  no significant change and no evidence of disease recurrence.  Both axillae are benign.  CBC    Component Value Date/Time   WBC 8.4 01/12/2022 0443   RBC 3.67 (L) 01/12/2022 0443   HGB 9.8 (L) 01/12/2022 0443   HGB 12.8 11/13/2021 1317   HGB 13.1 12/02/2016 1213   HCT 31.6 (L) 01/12/2022 0443   HCT 39.4 12/02/2016 1213   PLT 214 01/12/2022 0443   PLT 254 11/13/2021 1317   PLT 250 12/02/2016 1213   MCV 86.1 01/12/2022 0443   MCV 86.3 12/02/2016 1213   MCH 26.7 01/12/2022 0443   MCHC 31.0 01/12/2022 0443   RDW 15.0 01/12/2022 0443   RDW 15.1 (H) 12/02/2016 1213   LYMPHSABS 2.0 01/08/2022 1832   LYMPHSABS 2.5 12/02/2016 1213   MONOABS 0.7 01/08/2022 1832   MONOABS 0.5 12/02/2016 1213   EOSABS 0.1 01/08/2022 1832   EOSABS 0.2 12/02/2016 1213   BASOSABS 0.1 01/08/2022 1832   BASOSABS 0.1 12/02/2016 1213     STUDIES: No results found.   ELIGIBLE FOR AVAILABLE RESEARCH PROTOCOL: no   ASSESSMENT: 82 y.o. [place] woman s/p left breast upper outer quadrant biopsy 11/20/2016 for a clinical T1c N0, stage IA invasive ductal carcinoma, grade 1, estrogen and progesterone receptor positive, HER-2 not amplified, with an Mib-1 of 15%  (1) s/p left lumpectomy with sentinel lymph node sampling 12/11/2016 for a pT1c pN0 invasive ductal carcinoma, grade 2, with negative margins  (2) oncotype score of 24 predicted a 10-year risk of recurrence outside the breast of 16% if the patient's only systemic therapy was tamoxifen for 5 years.  It also predicted no significant benefit from chemotherapy.  (3) adjuvant radiation 01/27/2017 - 02/23/2017  Site/dose:   The patient initially received a dose of 42.5 Gy in 17 fractions to the breast using whole-breast tangent fields. This was delivered using a 3-D conformal technique. The patient then received a boost to the seroma. This delivered an additional 7.5 Gy in 3 fractions using a 3 field photon technique due to the depth of the seroma. The total dose  was 50 Gy.   (4) anastrozole started 03/16/2017  (a) bone density at [facility] 12/10/2015 found a T score of -1.6  (b) repeat bone density 12/15/2017 shows a T score of -1.6 (unchanged).  (c) off anastrozole January 2020 through May 2020 (patient  did not refill prescription)  PLAN:  Per Dr. Virgie Dad last visit, she was to complete 5 years of antiestrogen therapy.  She however has discontinued it since early October when she ran out of her current prescription.  She did not call us for refill.  She was hoping that she does not have to go back on it since she almost took it for 5 years. Mammogram July 31 with no mammographic evidence of malignancy. Physical examination today with no concerns.  Postop changes in the left breast.  Heterogeneously dense breasts noted chest like mammogram results. CBC and CMP today without any concerns from oncology standpoint. She would like to be graduated and she wants to come back to follow-up as needed.  She will have her regular mammograms ordered by her PCP Dr. Dema Severin. I have also recommended self breast exam monthly and annual breast exam by Dr. Dema Severin.  Total time spent: 30 minutes  *Total Encounter Time as defined by the Centers for Medicare and Medicaid Services includes, in addition to the face-to-face time of a patient visit (documented in the note above) non-face-to-face time: obtaining and reviewing outside history, ordering and reviewing medications, tests or procedures, care coordination (communications with other health care professionals or caregivers) and documentation in the medical record.

## 2022-03-05 ENCOUNTER — Telehealth: Payer: Self-pay

## 2022-03-05 NOTE — Telephone Encounter (Signed)
Called patient back but she stated she had already spoken with Dr. Chryl Heck. Gardiner Rhyme, RN

## 2022-03-12 ENCOUNTER — Other Ambulatory Visit: Payer: Self-pay

## 2022-03-12 ENCOUNTER — Emergency Department (HOSPITAL_COMMUNITY): Payer: PPO

## 2022-03-12 ENCOUNTER — Inpatient Hospital Stay (HOSPITAL_COMMUNITY)
Admission: EM | Admit: 2022-03-12 | Discharge: 2022-03-16 | DRG: 369 | Disposition: A | Discharge status: Home / Self Care | Payer: PPO | Attending: Internal Medicine | Admitting: Internal Medicine

## 2022-03-12 DIAGNOSIS — E860 Dehydration: Secondary | ICD-10-CM | POA: Diagnosis present

## 2022-03-12 DIAGNOSIS — A0811 Acute gastroenteropathy due to Norwalk agent: Secondary | ICD-10-CM | POA: Diagnosis present

## 2022-03-12 DIAGNOSIS — Z86718 Personal history of other venous thrombosis and embolism: Secondary | ICD-10-CM | POA: Diagnosis not present

## 2022-03-12 DIAGNOSIS — N1832 Chronic kidney disease, stage 3b: Secondary | ICD-10-CM | POA: Diagnosis present

## 2022-03-12 DIAGNOSIS — N200 Calculus of kidney: Secondary | ICD-10-CM | POA: Diagnosis present

## 2022-03-12 DIAGNOSIS — K2101 Gastro-esophageal reflux disease with esophagitis, with bleeding: Secondary | ICD-10-CM | POA: Diagnosis present

## 2022-03-12 DIAGNOSIS — Z820 Family history of epilepsy and other diseases of the nervous system: Secondary | ICD-10-CM

## 2022-03-12 DIAGNOSIS — D649 Anemia, unspecified: Secondary | ICD-10-CM | POA: Diagnosis not present

## 2022-03-12 DIAGNOSIS — Z923 Personal history of irradiation: Secondary | ICD-10-CM

## 2022-03-12 DIAGNOSIS — I129 Hypertensive chronic kidney disease with stage 1 through stage 4 chronic kidney disease, or unspecified chronic kidney disease: Secondary | ICD-10-CM | POA: Diagnosis present

## 2022-03-12 DIAGNOSIS — I700 Atherosclerosis of aorta: Secondary | ICD-10-CM | POA: Diagnosis present

## 2022-03-12 DIAGNOSIS — N281 Cyst of kidney, acquired: Secondary | ICD-10-CM | POA: Diagnosis present

## 2022-03-12 DIAGNOSIS — E669 Obesity, unspecified: Secondary | ICD-10-CM | POA: Diagnosis present

## 2022-03-12 DIAGNOSIS — K529 Noninfective gastroenteritis and colitis, unspecified: Secondary | ICD-10-CM | POA: Diagnosis not present

## 2022-03-12 DIAGNOSIS — D638 Anemia in other chronic diseases classified elsewhere: Secondary | ICD-10-CM | POA: Diagnosis not present

## 2022-03-12 DIAGNOSIS — K921 Melena: Principal | ICD-10-CM

## 2022-03-12 DIAGNOSIS — K760 Fatty (change of) liver, not elsewhere classified: Secondary | ICD-10-CM | POA: Diagnosis not present

## 2022-03-12 DIAGNOSIS — I10 Essential (primary) hypertension: Secondary | ICD-10-CM | POA: Diagnosis not present

## 2022-03-12 DIAGNOSIS — Z7901 Long term (current) use of anticoagulants: Secondary | ICD-10-CM

## 2022-03-12 DIAGNOSIS — R1033 Periumbilical pain: Secondary | ICD-10-CM | POA: Diagnosis not present

## 2022-03-12 DIAGNOSIS — Z800 Family history of malignant neoplasm of digestive organs: Secondary | ICD-10-CM | POA: Diagnosis not present

## 2022-03-12 DIAGNOSIS — I509 Heart failure, unspecified: Secondary | ICD-10-CM | POA: Diagnosis not present

## 2022-03-12 DIAGNOSIS — E039 Hypothyroidism, unspecified: Secondary | ICD-10-CM | POA: Diagnosis present

## 2022-03-12 DIAGNOSIS — F32A Depression, unspecified: Secondary | ICD-10-CM | POA: Diagnosis present

## 2022-03-12 DIAGNOSIS — Z853 Personal history of malignant neoplasm of breast: Secondary | ICD-10-CM | POA: Diagnosis not present

## 2022-03-12 DIAGNOSIS — Z8249 Family history of ischemic heart disease and other diseases of the circulatory system: Secondary | ICD-10-CM | POA: Diagnosis not present

## 2022-03-12 DIAGNOSIS — Z885 Allergy status to narcotic agent status: Secondary | ICD-10-CM

## 2022-03-12 DIAGNOSIS — D509 Iron deficiency anemia, unspecified: Secondary | ICD-10-CM | POA: Diagnosis not present

## 2022-03-12 DIAGNOSIS — D62 Acute posthemorrhagic anemia: Secondary | ICD-10-CM | POA: Diagnosis not present

## 2022-03-12 DIAGNOSIS — Z86010 Personal history of colonic polyps: Secondary | ICD-10-CM | POA: Diagnosis not present

## 2022-03-12 DIAGNOSIS — F419 Anxiety disorder, unspecified: Secondary | ICD-10-CM | POA: Diagnosis present

## 2022-03-12 DIAGNOSIS — E1122 Type 2 diabetes mellitus with diabetic chronic kidney disease: Secondary | ICD-10-CM | POA: Diagnosis present

## 2022-03-12 DIAGNOSIS — N261 Atrophy of kidney (terminal): Secondary | ICD-10-CM | POA: Diagnosis present

## 2022-03-12 DIAGNOSIS — K5730 Diverticulosis of large intestine without perforation or abscess without bleeding: Secondary | ICD-10-CM | POA: Diagnosis not present

## 2022-03-12 DIAGNOSIS — D75839 Thrombocytosis, unspecified: Secondary | ICD-10-CM | POA: Diagnosis present

## 2022-03-12 DIAGNOSIS — Z86711 Personal history of pulmonary embolism: Secondary | ICD-10-CM

## 2022-03-12 DIAGNOSIS — E0590 Thyrotoxicosis, unspecified without thyrotoxic crisis or storm: Secondary | ICD-10-CM | POA: Diagnosis present

## 2022-03-12 DIAGNOSIS — E782 Mixed hyperlipidemia: Secondary | ICD-10-CM | POA: Diagnosis not present

## 2022-03-12 DIAGNOSIS — E876 Hypokalemia: Secondary | ICD-10-CM

## 2022-03-12 DIAGNOSIS — Z96651 Presence of right artificial knee joint: Secondary | ICD-10-CM | POA: Diagnosis present

## 2022-03-12 DIAGNOSIS — I110 Hypertensive heart disease with heart failure: Secondary | ICD-10-CM | POA: Diagnosis not present

## 2022-03-12 DIAGNOSIS — Z803 Family history of malignant neoplasm of breast: Secondary | ICD-10-CM

## 2022-03-12 DIAGNOSIS — Z6831 Body mass index (BMI) 31.0-31.9, adult: Secondary | ICD-10-CM | POA: Diagnosis not present

## 2022-03-12 DIAGNOSIS — Z888 Allergy status to other drugs, medicaments and biological substances status: Secondary | ICD-10-CM

## 2022-03-12 DIAGNOSIS — Z79899 Other long term (current) drug therapy: Secondary | ICD-10-CM

## 2022-03-12 DIAGNOSIS — K2100 Gastro-esophageal reflux disease with esophagitis, without bleeding: Secondary | ICD-10-CM | POA: Diagnosis not present

## 2022-03-12 DIAGNOSIS — Z886 Allergy status to analgesic agent status: Secondary | ICD-10-CM

## 2022-03-12 LAB — LIPASE, BLOOD: Lipase: 31 U/L (ref 11–51)

## 2022-03-12 LAB — COMPREHENSIVE METABOLIC PANEL
ALT: 10 U/L (ref 0–44)
AST: 19 U/L (ref 15–41)
Albumin: 3.4 g/dL — ABNORMAL LOW (ref 3.5–5.0)
Alkaline Phosphatase: 79 U/L (ref 38–126)
Anion gap: 11 (ref 5–15)
BUN: 12 mg/dL (ref 8–23)
CO2: 23 mmol/L (ref 22–32)
Calcium: 9 mg/dL (ref 8.9–10.3)
Chloride: 105 mmol/L (ref 98–111)
Creatinine, Ser: 1.32 mg/dL — ABNORMAL HIGH (ref 0.44–1.00)
GFR, Estimated: 41 mL/min — ABNORMAL LOW (ref >60)
Glucose, Bld: 73 mg/dL (ref 70–99)
Potassium: 3.4 mmol/L — ABNORMAL LOW (ref 3.5–5.1)
Sodium: 139 mmol/L (ref 135–145)
Total Bilirubin: 0.6 mg/dL (ref 0.3–1.2)
Total Protein: 7.2 g/dL (ref 6.5–8.1)

## 2022-03-12 LAB — CBG MONITORING, ED: Glucose-Capillary: 72 mg/dL (ref 70–99)

## 2022-03-12 LAB — URINALYSIS, ROUTINE W REFLEX MICROSCOPIC
Bacteria, UA: NONE SEEN
Bilirubin Urine: NEGATIVE
Glucose, UA: >=500 mg/dL — AB
Hgb urine dipstick: NEGATIVE
Ketones, ur: NEGATIVE mg/dL
Leukocytes,Ua: NEGATIVE
Nitrite: NEGATIVE
Protein, ur: NEGATIVE mg/dL
Specific Gravity, Urine: 1.018 (ref 1.005–1.030)
pH: 6 (ref 5.0–8.0)

## 2022-03-12 LAB — CBC WITH DIFFERENTIAL/PLATELET
Abs Immature Granulocytes: 0.07 10*3/uL (ref 0.00–0.07)
Basophils Absolute: 0.1 10*3/uL (ref 0.0–0.1)
Basophils Relative: 1 %
Eosinophils Absolute: 0.3 10*3/uL (ref 0.0–0.5)
Eosinophils Relative: 3 %
HCT: 29 % — ABNORMAL LOW (ref 36.0–46.0)
Hemoglobin: 8.5 g/dL — ABNORMAL LOW (ref 12.0–15.0)
Immature Granulocytes: 1 %
Lymphocytes Relative: 26 %
Lymphs Abs: 2.8 10*3/uL (ref 0.7–4.0)
MCH: 23 pg — ABNORMAL LOW (ref 26.0–34.0)
MCHC: 29.3 g/dL — ABNORMAL LOW (ref 30.0–36.0)
MCV: 78.4 fL — ABNORMAL LOW (ref 80.0–100.0)
Monocytes Absolute: 0.7 10*3/uL (ref 0.1–1.0)
Monocytes Relative: 7 %
Neutro Abs: 6.8 10*3/uL (ref 1.7–7.7)
Neutrophils Relative %: 62 %
Platelets: 508 10*3/uL — ABNORMAL HIGH (ref 150–400)
RBC: 3.7 MIL/uL — ABNORMAL LOW (ref 3.87–5.11)
RDW: 16.8 % — ABNORMAL HIGH (ref 11.5–15.5)
WBC: 10.7 10*3/uL — ABNORMAL HIGH (ref 4.0–10.5)
nRBC: 0 % (ref 0.0–0.2)

## 2022-03-12 LAB — TYPE AND SCREEN
ABO/RH(D): O POS
Antibody Screen: NEGATIVE

## 2022-03-12 LAB — POC OCCULT BLOOD, ED: Fecal Occult Bld: POSITIVE — AB

## 2022-03-12 MED ORDER — HEPARIN (PORCINE) 25000 UT/250ML-% IV SOLN
950.0000 [IU]/h | INTRAVENOUS | Status: AC
  Administered 2022-03-12: 950 [IU]/h via INTRAVENOUS
  Filled 2022-03-12: qty 250

## 2022-03-12 MED ORDER — PROCHLORPERAZINE EDISYLATE 10 MG/2ML IJ SOLN: 5.0000 mg | Freq: Four times a day (QID) | INTRAMUSCULAR | Status: DC | PRN

## 2022-03-12 MED ORDER — IOHEXOL 300 MG/ML  SOLN
80.0000 mL | Freq: Once | INTRAMUSCULAR | Status: AC | PRN
  Administered 2022-03-12: 80 mL via INTRAVENOUS

## 2022-03-12 MED ORDER — SODIUM CHLORIDE 0.9 % IV SOLN
2.0000 g | INTRAVENOUS | Status: DC
  Administered 2022-03-13 – 2022-03-16 (×4): 2 g via INTRAVENOUS
  Filled 2022-03-12 (×4): qty 20

## 2022-03-12 MED ORDER — POTASSIUM CHLORIDE CRYS ER 20 MEQ PO TBCR
20.0000 meq | EXTENDED_RELEASE_TABLET | Freq: Once | ORAL | Status: AC
  Administered 2022-03-12: 20 meq via ORAL
  Filled 2022-03-12: qty 1

## 2022-03-12 MED ORDER — ESCITALOPRAM OXALATE 10 MG PO TABS
10.0000 mg | ORAL_TABLET | Freq: Every day | ORAL | Status: DC
  Administered 2022-03-13 – 2022-03-16 (×4): 10 mg via ORAL
  Filled 2022-03-12 (×4): qty 1

## 2022-03-12 MED ORDER — METRONIDAZOLE 500 MG/100ML IV SOLN
500.0000 mg | Freq: Two times a day (BID) | INTRAVENOUS | Status: DC
  Administered 2022-03-13 – 2022-03-16 (×7): 500 mg via INTRAVENOUS
  Filled 2022-03-12 (×7): qty 100

## 2022-03-12 MED ORDER — ACETAMINOPHEN 500 MG PO TABS
500.0000 mg | ORAL_TABLET | Freq: Four times a day (QID) | ORAL | Status: DC | PRN
  Administered 2022-03-13 (×2): 500 mg via ORAL
  Filled 2022-03-12 (×3): qty 1

## 2022-03-12 MED ORDER — POTASSIUM CHLORIDE 2 MEQ/ML IV SOLN
INTRAVENOUS | Status: DC
  Filled 2022-03-12: qty 1000

## 2022-03-12 MED ORDER — TRAZODONE HCL 50 MG PO TABS
25.0000 mg | ORAL_TABLET | Freq: Every evening | ORAL | Status: DC | PRN
  Administered 2022-03-12: 50 mg via ORAL
  Administered 2022-03-14: 25 mg via ORAL
  Administered 2022-03-15: 50 mg via ORAL
  Filled 2022-03-12 (×3): qty 1

## 2022-03-12 MED ORDER — PANTOPRAZOLE SODIUM 40 MG IV SOLR
40.0000 mg | Freq: Two times a day (BID) | INTRAVENOUS | Status: DC
  Administered 2022-03-12 – 2022-03-13 (×2): 40 mg via INTRAVENOUS
  Filled 2022-03-12 (×2): qty 10

## 2022-03-12 MED ORDER — LEVOTHYROXINE SODIUM 100 MCG PO TABS
100.0000 ug | ORAL_TABLET | Freq: Every day | ORAL | Status: DC
  Administered 2022-03-14 – 2022-03-16 (×3): 100 ug via ORAL
  Filled 2022-03-12 (×3): qty 1

## 2022-03-12 NOTE — ED Notes (Signed)
Blood sugar 72. Sandwich and drink given to patient. JRPRN

## 2022-03-12 NOTE — ED Provider Triage Note (Signed)
Emergency Medicine Provider Triage Evaluation Note  [name] , a 82 y.o. female  was evaluated in triage.  Pt complains of dark and tarry stools for the past 2 weeks with generalized abdominal pain.  Denies any trouble with urination or fevers.  Reports diffuse abdominal pain.  She reports that she was sent over here from Dr. Collene Mares for evaluation given that she had a hemoglobin of 8.    Review of Systems  Positive:  Negative:   Physical Exam  BP (!) 144/70   Pulse 85   Temp 98 F (36.7 C)   Resp 20   SpO2 99%  Gen:   Awake, no distress   Resp:  Normal effort  MSK:   Moves extremities without difficulty  Other:  Diffuse abdominal tenderness. Soft.  Medical Decision Making  Medically screening exam initiated at 3:45 PM.  Appropriate orders placed.  [name] was informed that the remainder of the evaluation will be completed by another provider, this initial triage assessment does not replace that evaluation, and the importance of remaining in the ED until their evaluation is complete.  I did speak with Dr. Collene Mares on the phone and she reports that the hemoglobin of 8 was a while ago and did not have any follow-up.  She would like labs and to be admitted to medicine for endoscopy tomorrow, NPO after midnight.    Sherrell Puller, [place] 03/12/22 1546

## 2022-03-12 NOTE — Progress Notes (Addendum)
ANTICOAGULATION CONSULT NOTE - Initial Consult  Pharmacy Consult for Heparin  (on apixaban PTA) Indication: h/o pulmonary embolus with right heart strain  Allergies  Allergen Reactions   Amitriptyline Other (See Comments)    hallucinations   Aspirin Other (See Comments)    Bleeding   Nsaids Other (See Comments)    Lower GI bleeding   Meperidine Hcl Other (See Comments)    Causes migraines Other reaction(s): migraine   Morphine And Related Rash   Morphine Sulfate Rash    Other reaction(s): rash    Patient Measurements:   Heparin Dosing Weight: 67 kg  Vital Signs: Temp: 98.3 F (36.8 C) (02/15 2016) Temp Source: Oral (02/15 2016) BP: 138/71 (02/15 2030) Pulse Rate: 81 (02/15 2045)  Labs: Recent Labs    03/12/22 1701  HGB 8.5*  HCT 29.0*  PLT 508*  CREATININE 1.32*    Estimated Creatinine Clearance: 33.5 mL/min (A) (by C-G formula based on SCr of 1.32 mg/dL (H)).   Medical History: Past Medical History:  Diagnosis Date   Anxiety    Arthritis    knees   Cancer ([place]) 11/2016   Left breast cancer   Depression    Diabetes mellitus without complication ([place])    Diverticulitis    Diverticulosis    bleeding   GERD (gastroesophageal reflux disease)    Hypertension    Hyperthyroidism    nodule on thyroid, Radioactive, now hypo   Hypothyroidism, iatrogenic    After RI now hypo on synthroid   Personal history of radiation therapy     Medications:  PTA Apixaban 25m po BID - LD 2/15 @ 0830  Assessment: 89yr female admitted with upper GI bleed Heme occult + Hgb 8.5 PMH significant for PE with right heart strain and DVT (Dec 2023) and currently on Apixaban 571mBID, HTN, DM, history of breast cancer. Apixaban can falsely elevate heparin levels. Will monitor heparin therapy with aPTT until effects of apixaban have worn off and aPTT and heparin levels correlate Hospitalization in Dec shows patient with therapeutic heparin levels with heparin gtt @ 950  units/hr  Goal of Therapy:  Heparin level 0.3-0.7 units/ml aPTT 66-102 seconds Monitor platelets by anticoagulation protocol: Yes   Plan:  Obtain baseline aPTT ( 32 sec) and HL (> 1.1) No heparin bolus per MD due to upper GI bleed Begin heparin gtt @ 950 units/hr Check aPTT and HL 8 hr after heparin gtt started Daily HL and CBC GI to consult.   F/U plans for holding heparin gtt prior to procedures  Fain Francis, LeToribio HarbourPharmD 03/12/2022,9:54 PM

## 2022-03-12 NOTE — H&P (Signed)
History and Physical  [name] [id number] DOB: [birth year] DOA: 03/12/2022  Referring physician: Sherrell Puller, PA-EDP  PCP: [name], MD  Outpatient Specialists: GI, cardiology, oncology. Patient coming from: Home.  Chief Complaint: Abdominal pain, dark stools, anemia.  HPI: [name] is a 82 y.o. female with medical history significant for recent pulmonary embolism with right heart strain/DVT (diagnosed in December 2023) on Eliquis, hypertension, type 2 diabetes, history of breast cancer followed by Dr. Jana Hakim, who presented to [facility] ED sent by her gastroenterologist Dr. Collene Mares due to concern for upper GI bleed.    2 weeks ago the patient noted bright red blood per rectum and since then, she has had melanic stools.  Associated with intermittent upper abdominal pain, worse after meals.  She is on Eliquis for thromboembolism, last dose was this morning around 8:30 AM.  She presented to the ED at the recommendation of Dr. Collene Mares for further evaluation.  EDP discussed the case with Dr. Collene Mares who recommended admission by hospitalist service with plan for endoscopy in the morning.  GI will see the patient in consultation.    In the ED, afebrile with mild leukocytosis 10.7, hemoglobin 8.5 from 9.8 in December 2023.  Positive FOBT with dark stool on digital exam.  Physical exam notable for upper abdominal tenderness with palpation.  No nausea.  No anginal symptoms.  The patient was admitted by [facility], hospitalist service.  ED Course: Tmax 98.3.  BP 151/75, pulse 87, respiratory 18, saturation 100% on room air.  Lab studies remarkable for WBC 10.7, hemoglobin 8.5, MCV 78.4, platelet count 508.  Review of Systems: Review of systems as noted in the HPI. All other systems reviewed and are negative.   Past Medical History:  Diagnosis Date   Anxiety    Arthritis    knees   Cancer ([place]) 11/2016   Left breast cancer   Depression    Diabetes mellitus without complication ([place])     Diverticulitis    Diverticulosis    bleeding   GERD (gastroesophageal reflux disease)    Hypertension    Hyperthyroidism    nodule on thyroid, Radioactive, now hypo   Hypothyroidism, iatrogenic    After RI now hypo on synthroid   Personal history of radiation therapy    Past Surgical History:  Procedure Laterality Date   ABDOMINAL HYSTERECTOMY  1977   BLADDER SURGERY     bladder suspension   BREAST EXCISIONAL BIOPSY Right    BREAST LUMPECTOMY Left 2018   BREAST LUMPECTOMY WITH RADIOACTIVE SEED AND SENTINEL LYMPH NODE BIOPSY Left 12/11/2016   Procedure: BREAST LUMPECTOMY WITH RADIOACTIVE SEED AND SENTINEL LYMPH NODE BIOPSY;  Surgeon: Rolm Bookbinder, MD;  Location: [place];  Service: General;  Laterality: Left;   CHOLECYSTECTOMY N/A 03/29/2013   Procedure: LAPAROSCOPIC CHOLECYSTECTOMY WITH INTRAOPERATIVE CHOLANGIOGRAM;  Surgeon: Edward Jolly, MD;  Location: [place];  Service: General;  Laterality: N/A;   JOINT REPLACEMENT Right 2009   total knee replacement   ROTATOR CUFF REPAIR Left    TOTAL KNEE REVISION  12/17/2010   Procedure: TOTAL KNEE REVISION;  Surgeon: Dione Plover Aluisio;  Location: [facility];  Service: Orthopedics;  Laterality: Right;    Social History:  reports that she has never smoked. She has never used smokeless tobacco. She reports current alcohol use. She reports that she does not use drugs.   Allergies  Allergen Reactions   Amitriptyline Other (See Comments)    hallucinations   Aspirin Other (See Comments)  Bleeding   Nsaids Other (See Comments)    Lower GI bleeding   Meperidine Hcl Other (See Comments)    Causes migraines Other reaction(s): migraine   Morphine And Related Rash   Morphine Sulfate Rash    Other reaction(s): rash    Family History  Problem Relation Age of Onset   Diverticulitis Mother    Alzheimer's disease Mother    Colon cancer Maternal Grandmother    Heart attack Father    Breast cancer Daughter        Prior to Admission medications   Medication Sig Start Date End Date Taking? Authorizing Provider  acetaminophen (TYLENOL) 325 MG tablet Take 1-2 tablets (325-650 mg total) by mouth every 4 (four) hours as needed for mild pain. 04/02/20   Love, Ivan Anchors, PA-C  albuterol (VENTOLIN HFA) 108 (90 Base) MCG/ACT inhaler Inhale 2 puffs into the lungs every 6 (six) hours as needed. 01/28/22   [provider]  amLODipine (NORVASC) 5 MG tablet Take 5 mg by mouth daily.    [provider]  apixaban (ELIQUIS) 5 MG TABS tablet Take 5 mg by mouth 2 (two) times daily.    [provider]  dapagliflozin propanediol (FARXIGA) 5 MG TABS tablet Take 5 mg by mouth daily. 05/21/21   [provider]  diphenhydrAMINE (BENADRYL) 25 mg capsule Take 1 capsule (25 mg total) by mouth every 6 (six) hours as needed for itching. 03/22/20   Viona Gilmore D, NP  escitalopram (LEXAPRO) 10 MG tablet Take 10 mg daily by mouth.    [provider]  glimepiride (AMARYL) 2 MG tablet Take 2 mg by mouth 2 (two) times daily.    [provider]  Levothyroxine Sodium 100 MCG CAPS Take 100 mcg daily by mouth.  11/21/15   [provider]  metoprolol succinate (TOPROL-XL) 50 MG 24 hr tablet Take 50 mg by mouth daily. 03/04/20   [provider]  promethazine-dextromethorphan (PROMETHAZINE-DM) 6.25-15 MG/5ML syrup Take 5 mLs by mouth every 6 (six) hours as needed for cough. 12/29/21   [provider]  traMADol (ULTRAM) 50 MG tablet Take 50 mg by mouth every 8 (eight) hours as needed for moderate pain.    [provider]  traZODone (DESYREL) 50 MG tablet Take 0.5-1 tablets (25-50 mg total) by mouth at bedtime as needed for sleep. Patient taking differently: Take 50 mg by mouth at bedtime as needed for sleep. 04/02/20   Bary Leriche, PA-C    Physical Exam: BP 138/71   Pulse 81   Temp 98.3 F (36.8 C) (Oral)   Resp 18   SpO2 100%   General: 82 y.o. year-old  female well developed well nourished in no acute distress.  Alert and oriented x3. Cardiovascular: Regular rate and rhythm with no rubs or gallops.  No thyromegaly or JVD noted.  No lower extremity edema. 2/4 pulses in all 4 extremities. Respiratory: Clear to auscultation with no wheezes or rales. Good inspiratory effort. Abdomen: Soft upper abdomen tenderness with palpation.  Nondistended with normal bowel sounds x4 quadrants. Muskuloskeletal: No cyanosis, clubbing or edema noted bilaterally Neuro: CN II-XII intact, strength, sensation, reflexes Skin: No ulcerative lesions noted or rashes Psychiatry: Judgement and insight appear normal. Mood is appropriate for condition and setting          Labs on Admission:  Basic Metabolic Panel: Recent Labs  Lab 03/12/22 1701  NA 139  K 3.4*  CL 105  CO2 23  GLUCOSE 73  BUN  12  CREATININE 1.32*  CALCIUM 9.0   Liver Function Tests: Recent Labs  Lab 03/12/22 1701  AST 19  ALT 10  ALKPHOS 79  BILITOT 0.6  PROT 7.2  ALBUMIN 3.4*   Recent Labs  Lab 03/12/22 1701  LIPASE 31   No results for input(s): "AMMONIA" in the last 168 hours. CBC: Recent Labs  Lab 03/12/22 1701  WBC 10.7*  NEUTROABS 6.8  HGB 8.5*  HCT 29.0*  MCV 78.4*  PLT 508*   Cardiac Enzymes: No results for input(s): "CKTOTAL", "CKMB", "CKMBINDEX", "TROPONINI" in the last 168 hours.  BNP (last 3 results) Recent Labs    01/08/22 1822  BNP 687.9*    ProBNP (last 3 results) No results for input(s): "PROBNP" in the last 8760 hours.  CBG: Recent Labs  Lab 03/12/22 2054  GLUCAP 72    Radiological Exams on Admission: CT ABDOMEN PELVIS W CONTRAST  Result Date: 03/12/2022 CLINICAL DATA:  Dark tarry stools and abdominal pain. EXAM: CT ABDOMEN AND PELVIS WITH CONTRAST TECHNIQUE: Multidetector CT imaging of the abdomen and pelvis was performed using the standard protocol following bolus administration of intravenous contrast. RADIATION DOSE REDUCTION: This  exam was performed according to the departmental dose-optimization program which includes automated exposure control, adjustment of the mA and/or kV according to patient size and/or use of iterative reconstruction technique. CONTRAST:  55m OMNIPAQUE IOHEXOL 300 MG/ML  SOLN COMPARISON:  March 19, 2020 FINDINGS: Lower chest: No acute abnormality. Hepatobiliary: There is diffuse fatty infiltration of the liver parenchyma. A 4 mm focus of parenchymal low attenuation is seen within the left lobe of the liver. Status post cholecystectomy. No biliary dilatation. Pancreas: Unremarkable. No pancreatic ductal dilatation or surrounding inflammatory changes. Spleen: Normal in size without focal abnormality. Adrenals/Urinary Tract: Adrenal glands are unremarkable. The left kidney is atrophic in appearance. The right kidney is normal in size. Numerous subcentimeter non-obstructing renal calculi are seen within the left kidney. Several bilateral simple renal cysts are seen. Bladder is unremarkable. Stomach/Bowel: Stomach is within normal limits. Appendix appears normal. No evidence of bowel dilatation. Moderately and thickened mid to distal ascending colon is seen. Noninflamed diverticula are noted throughout the sigmoid colon. Vascular/Lymphatic: Aortic atherosclerosis. No enlarged abdominal or pelvic lymph nodes. Reproductive: Status post hysterectomy. No adnexal masses. Other: No abdominal wall hernia or abnormality. No abdominopelvic ascites. Musculoskeletal: Multilevel degenerative changes seen throughout the lumbar spine, most prominent at the level of L5-S1. IMPRESSION: 1. Moderate severity colitis involving the mid to distal ascending colon. 2. Sigmoid diverticulosis. 3. Hepatic steatosis. 4. Numerous subcentimeter non-obstructing renal calculi within the left kidney. 5. Atrophic left kidney. 6. Several bilateral simple renal cysts. 7. Aortic atherosclerosis. Aortic Atherosclerosis (ICD10-I70.0). Electronically Signed    By: TVirgina NorfolkM.D.   On: 03/12/2022 19:55    EKG: I independently viewed the EKG done and my findings are as followed: None available at the time of this visit.  Assessment/Plan Present on Admission:  Symptomatic anemia  Principal Problem:   Symptomatic anemia  Symptomatic anemia with suspected upper GI bleed Presented with hemoglobin of 8.5K MCV 78. Upper abdominal pain, melanic stools x 2 weeks, on home Eliquis Serial H&H every 6 hours x 3 IV Protonix 40 mg twice daily Obtain iron studies, replace iron if needed. Type and screen, transfuse hemoglobin less than 8.0. Plan for EGD on 03/13/2022 by GI, Dr. MCollene MaresClear liquid diet now N.p.o. after midnight  Acute blood loss anemia suspect secondary to upper GI bleed and possibly lower  GI bleed with colitis seen on CT scan. Microcytic anemia with hemoglobin 8.5K, MCV of 78 Hemoglobin from last admission, prior to discharge in December 2023, was 9.8K. Management as stated above Follow iron studies, replace if indicated.  Moderate severity colitis involving the mid to distal ascending colon, unclear if infectious Started Rocephin and IV Flagyl until active infective process is ruled out Gentle IV fluid hydration LR KCl 40 mill equivalent at 50 cc/h x 1 day.  Thrombocytosis, suspect reactive Platelet count 508 Monitor platelet count.  CKD 3B Creatinine 1.3 and GFR 41. Appears to be at her baseline 124 desiccation, dehydration and hypotension.  History of pulmonary embolism and right heart strain/DVT in December 2023 On Eliquis for 3-6 months Last dose of home Eliquis was taken around 8:30 AM. Pharmacy consulted for heparin drip    DVT prophylaxis: Heparin drip  Code Status: Full code  Family Communication: Husband at bedside  Disposition Plan: Admitted to telemetry unit  Consults called: GI, Dr. Collene Mares  Admission status: Observation status.   Status is: Observation    Kayleen Memos MD [facility] Pager [phone]  If 7PM-7AM, please contact night-coverage [email or web address] Password [facility]  03/12/2022, 9:04 PM

## 2022-03-12 NOTE — ED Triage Notes (Addendum)
C/o generalized abd pain with dark stools, black in color x2 weeks Denies urinary complaints, cp, dizziness, sob Sent to ED for Hemoglobin 8 Pt reports on eliquis for blood clot

## 2022-03-13 ENCOUNTER — Encounter (HOSPITAL_COMMUNITY): Payer: Self-pay | Admitting: Internal Medicine

## 2022-03-13 ENCOUNTER — Observation Stay (HOSPITAL_COMMUNITY): Payer: PPO | Admitting: Certified Registered Nurse Anesthetist

## 2022-03-13 ENCOUNTER — Inpatient Hospital Stay: Payer: PPO | Admitting: Hematology and Oncology

## 2022-03-13 ENCOUNTER — Encounter (HOSPITAL_COMMUNITY)
Admission: EM | Disposition: A | Discharge status: Home / Self Care | Payer: Self-pay | Source: Home / Self Care | Attending: Internal Medicine

## 2022-03-13 DIAGNOSIS — I509 Heart failure, unspecified: Secondary | ICD-10-CM | POA: Diagnosis not present

## 2022-03-13 DIAGNOSIS — D649 Anemia, unspecified: Secondary | ICD-10-CM | POA: Diagnosis not present

## 2022-03-13 DIAGNOSIS — N200 Calculus of kidney: Secondary | ICD-10-CM | POA: Diagnosis present

## 2022-03-13 DIAGNOSIS — F32A Depression, unspecified: Secondary | ICD-10-CM | POA: Diagnosis present

## 2022-03-13 DIAGNOSIS — E039 Hypothyroidism, unspecified: Secondary | ICD-10-CM | POA: Diagnosis not present

## 2022-03-13 DIAGNOSIS — Z86718 Personal history of other venous thrombosis and embolism: Secondary | ICD-10-CM | POA: Diagnosis not present

## 2022-03-13 DIAGNOSIS — D509 Iron deficiency anemia, unspecified: Secondary | ICD-10-CM | POA: Diagnosis not present

## 2022-03-13 DIAGNOSIS — E860 Dehydration: Secondary | ICD-10-CM | POA: Diagnosis present

## 2022-03-13 DIAGNOSIS — I700 Atherosclerosis of aorta: Secondary | ICD-10-CM | POA: Diagnosis present

## 2022-03-13 DIAGNOSIS — Z86711 Personal history of pulmonary embolism: Secondary | ICD-10-CM | POA: Diagnosis not present

## 2022-03-13 DIAGNOSIS — E0590 Thyrotoxicosis, unspecified without thyrotoxic crisis or storm: Secondary | ICD-10-CM | POA: Diagnosis present

## 2022-03-13 DIAGNOSIS — E876 Hypokalemia: Secondary | ICD-10-CM | POA: Diagnosis present

## 2022-03-13 DIAGNOSIS — K529 Noninfective gastroenteritis and colitis, unspecified: Secondary | ICD-10-CM | POA: Diagnosis not present

## 2022-03-13 DIAGNOSIS — A0811 Acute gastroenteropathy due to Norwalk agent: Secondary | ICD-10-CM | POA: Diagnosis present

## 2022-03-13 DIAGNOSIS — R1084 Generalized abdominal pain: Secondary | ICD-10-CM

## 2022-03-13 DIAGNOSIS — D638 Anemia in other chronic diseases classified elsewhere: Secondary | ICD-10-CM

## 2022-03-13 DIAGNOSIS — I110 Hypertensive heart disease with heart failure: Secondary | ICD-10-CM

## 2022-03-13 DIAGNOSIS — E669 Obesity, unspecified: Secondary | ICD-10-CM | POA: Diagnosis present

## 2022-03-13 DIAGNOSIS — K2100 Gastro-esophageal reflux disease with esophagitis, without bleeding: Secondary | ICD-10-CM

## 2022-03-13 DIAGNOSIS — N1832 Chronic kidney disease, stage 3b: Secondary | ICD-10-CM | POA: Diagnosis present

## 2022-03-13 DIAGNOSIS — K2101 Gastro-esophageal reflux disease with esophagitis, with bleeding: Secondary | ICD-10-CM | POA: Diagnosis present

## 2022-03-13 DIAGNOSIS — Z6831 Body mass index (BMI) 31.0-31.9, adult: Secondary | ICD-10-CM | POA: Diagnosis not present

## 2022-03-13 DIAGNOSIS — N281 Cyst of kidney, acquired: Secondary | ICD-10-CM | POA: Diagnosis present

## 2022-03-13 DIAGNOSIS — E1122 Type 2 diabetes mellitus with diabetic chronic kidney disease: Secondary | ICD-10-CM | POA: Diagnosis present

## 2022-03-13 DIAGNOSIS — Z853 Personal history of malignant neoplasm of breast: Secondary | ICD-10-CM | POA: Diagnosis not present

## 2022-03-13 DIAGNOSIS — Z7901 Long term (current) use of anticoagulants: Secondary | ICD-10-CM | POA: Diagnosis not present

## 2022-03-13 DIAGNOSIS — Z923 Personal history of irradiation: Secondary | ICD-10-CM | POA: Diagnosis not present

## 2022-03-13 DIAGNOSIS — Z8249 Family history of ischemic heart disease and other diseases of the circulatory system: Secondary | ICD-10-CM | POA: Diagnosis not present

## 2022-03-13 DIAGNOSIS — D75839 Thrombocytosis, unspecified: Secondary | ICD-10-CM | POA: Diagnosis present

## 2022-03-13 DIAGNOSIS — N261 Atrophy of kidney (terminal): Secondary | ICD-10-CM | POA: Diagnosis present

## 2022-03-13 DIAGNOSIS — I129 Hypertensive chronic kidney disease with stage 1 through stage 4 chronic kidney disease, or unspecified chronic kidney disease: Secondary | ICD-10-CM | POA: Diagnosis present

## 2022-03-13 DIAGNOSIS — K921 Melena: Secondary | ICD-10-CM | POA: Diagnosis present

## 2022-03-13 DIAGNOSIS — D62 Acute posthemorrhagic anemia: Secondary | ICD-10-CM | POA: Diagnosis not present

## 2022-03-13 HISTORY — PX: ESOPHAGOGASTRODUODENOSCOPY (EGD) WITH PROPOFOL: SHX5813

## 2022-03-13 LAB — HEPARIN LEVEL (UNFRACTIONATED)
Heparin Unfractionated: >1.1 IU/mL — ABNORMAL HIGH (ref 0.30–0.70)
Heparin Unfractionated: >1.1 IU/mL — ABNORMAL HIGH (ref 0.30–0.70)

## 2022-03-13 LAB — CBC
HCT: 24.9 % — ABNORMAL LOW (ref 36.0–46.0)
Hemoglobin: 7.4 g/dL — ABNORMAL LOW (ref 12.0–15.0)
MCH: 22.8 pg — ABNORMAL LOW (ref 26.0–34.0)
MCHC: 29.7 g/dL — ABNORMAL LOW (ref 30.0–36.0)
MCV: 76.9 fL — ABNORMAL LOW (ref 80.0–100.0)
Platelets: 378 10*3/uL (ref 150–400)
RBC: 3.24 MIL/uL — ABNORMAL LOW (ref 3.87–5.11)
RDW: 16.7 % — ABNORMAL HIGH (ref 11.5–15.5)
WBC: 8.6 10*3/uL (ref 4.0–10.5)
nRBC: 0 % (ref 0.0–0.2)

## 2022-03-13 LAB — IRON AND TIBC
Iron: 19 ug/dL — ABNORMAL LOW (ref 28–170)
Saturation Ratios: 6 % — ABNORMAL LOW (ref 10.4–31.8)
TIBC: 349 ug/dL (ref 250–450)
UIBC: 330 ug/dL

## 2022-03-13 LAB — COMPREHENSIVE METABOLIC PANEL
ALT: 9 U/L (ref 0–44)
AST: 12 U/L — ABNORMAL LOW (ref 15–41)
Albumin: 3.3 g/dL — ABNORMAL LOW (ref 3.5–5.0)
Alkaline Phosphatase: 72 U/L (ref 38–126)
Anion gap: 9 (ref 5–15)
BUN: 10 mg/dL (ref 8–23)
CO2: 23 mmol/L (ref 22–32)
Calcium: 8.5 mg/dL — ABNORMAL LOW (ref 8.9–10.3)
Chloride: 107 mmol/L (ref 98–111)
Creatinine, Ser: 1.26 mg/dL — ABNORMAL HIGH (ref 0.44–1.00)
GFR, Estimated: 43 mL/min — ABNORMAL LOW (ref >60)
Glucose, Bld: 89 mg/dL (ref 70–99)
Potassium: 3.2 mmol/L — ABNORMAL LOW (ref 3.5–5.1)
Sodium: 139 mmol/L (ref 135–145)
Total Bilirubin: 0.4 mg/dL (ref 0.3–1.2)
Total Protein: 6 g/dL — ABNORMAL LOW (ref 6.5–8.1)

## 2022-03-13 LAB — HEMOGLOBIN AND HEMATOCRIT, BLOOD
HCT: 26.8 % — ABNORMAL LOW (ref 36.0–46.0)
HCT: 28.1 % — ABNORMAL LOW (ref 36.0–46.0)
Hemoglobin: 7.8 g/dL — ABNORMAL LOW (ref 12.0–15.0)
Hemoglobin: 8.2 g/dL — ABNORMAL LOW (ref 12.0–15.0)

## 2022-03-13 LAB — MAGNESIUM: Magnesium: 1.9 mg/dL (ref 1.7–2.4)

## 2022-03-13 LAB — GLUCOSE, CAPILLARY: Glucose-Capillary: 109 mg/dL — ABNORMAL HIGH (ref 70–99)

## 2022-03-13 LAB — FERRITIN: Ferritin: 11 ng/mL (ref 11–307)

## 2022-03-13 LAB — APTT
aPTT: 32 seconds (ref 24–36)
aPTT: 70 seconds — ABNORMAL HIGH (ref 24–36)

## 2022-03-13 SURGERY — ESOPHAGOGASTRODUODENOSCOPY (EGD) WITH PROPOFOL
Anesthesia: Monitor Anesthesia Care

## 2022-03-13 MED ORDER — PROPOFOL 10 MG/ML IV BOLUS
INTRAVENOUS | Status: DC | PRN
  Administered 2022-03-13: 40 mg via INTRAVENOUS

## 2022-03-13 MED ORDER — SODIUM CHLORIDE 0.9 % IV SOLN: INTRAVENOUS | Status: DC

## 2022-03-13 MED ORDER — POTASSIUM CHLORIDE 10 MEQ/100ML IV SOLN
10.0000 meq | INTRAVENOUS | Status: AC
  Administered 2022-03-13 (×4): 10 meq via INTRAVENOUS
  Filled 2022-03-13 (×4): qty 100

## 2022-03-13 MED ORDER — APIXABAN 5 MG PO TABS
5.0000 mg | ORAL_TABLET | Freq: Two times a day (BID) | ORAL | Status: DC
  Administered 2022-03-13 – 2022-03-16 (×7): 5 mg via ORAL
  Filled 2022-03-13 (×7): qty 1

## 2022-03-13 MED ORDER — LACTATED RINGERS IV SOLN: INTRAVENOUS | Status: DC | PRN

## 2022-03-13 MED ORDER — PANTOPRAZOLE SODIUM 40 MG PO TBEC
40.0000 mg | DELAYED_RELEASE_TABLET | Freq: Every day | ORAL | Status: DC
  Administered 2022-03-14: 40 mg via ORAL
  Filled 2022-03-13: qty 1

## 2022-03-13 MED ORDER — SODIUM CHLORIDE 0.9 % IV SOLN
250.0000 mg | Freq: Every day | INTRAVENOUS | Status: AC
  Administered 2022-03-13 – 2022-03-14 (×2): 250 mg via INTRAVENOUS
  Filled 2022-03-13 (×2): qty 20

## 2022-03-13 MED ORDER — LIDOCAINE 2% (20 MG/ML) 5 ML SYRINGE
INTRAMUSCULAR | Status: DC | PRN
  Administered 2022-03-13: 100 mg via INTRAVENOUS

## 2022-03-13 MED ORDER — PROPOFOL 500 MG/50ML IV EMUL
INTRAVENOUS | Status: DC | PRN
  Administered 2022-03-13: 150 ug/kg/min via INTRAVENOUS

## 2022-03-13 MED ORDER — HYDROMORPHONE HCL 1 MG/ML IJ SOLN
0.5000 mg | INTRAMUSCULAR | Status: DC | PRN
  Administered 2022-03-13 – 2022-03-16 (×7): 0.5 mg via INTRAVENOUS
  Filled 2022-03-13 (×7): qty 0.5

## 2022-03-13 SURGICAL SUPPLY — 15 items

## 2022-03-13 NOTE — Anesthesia Procedure Notes (Signed)
Procedure Name: MAC Date/Time: 03/13/2022 8:40 AM  Performed by: West Pugh, CRNAPre-anesthesia Checklist: Patient identified, Emergency Drugs available, Suction available, Patient being monitored and Timeout performed Patient Re-evaluated:Patient Re-evaluated prior to induction Oxygen Delivery Method: Simple face mask Preoxygenation: Pre-oxygenation with 100% oxygen Dental Injury: Teeth and Oropharynx as per pre-operative assessment

## 2022-03-13 NOTE — Op Note (Signed)
[facility] Patient Name: [name] Procedure Date: 03/13/2022 MRN: [id number] Attending MD: Carol Ada , MD, [id number] Date of Birth: [birth year] CSN: [id number] Age: 82 Admit Type: Inpatient Procedure:                Upper GI endoscopy Indications:              Acute post hemorrhagic anemia, Heme positive stool,                            Melena Providers:                Carol Ada, MD, Carlyn Reichert, RN, William Dalton, Technician, Tyrone Apple, Technician,                            Christell Faith, CRNA Referring MD:              Medicines:                Propofol per Anesthesia Complications:            No immediate complications. Estimated Blood Loss:     Estimated blood loss: none. Procedure:                Pre-Anesthesia Assessment:                           - Prior to the procedure, a History and Physical                            was performed, and patient medications and                            allergies were reviewed. The patient's tolerance of                            previous anesthesia was also reviewed. The risks                            and benefits of the procedure and the sedation                            options and risks were discussed with the patient.                            All questions were answered, and informed consent                            was obtained. Prior Anticoagulants: The patient has                            taken heparin, last dose was day of procedure. ASA                            Grade  Assessment: III - A patient with severe                            systemic disease. After reviewing the risks and                            benefits, the patient was deemed in satisfactory                            condition to undergo the procedure.                           - Sedation was administered by an anesthesia                            professional. Deep sedation was  attained.                           After obtaining informed consent, the endoscope was                            passed under direct vision. Throughout the                            procedure, the patient's blood pressure, pulse, and                            oxygen saturations were monitored continuously. The                            [id number] Olympus endoscope was introduced                            through the mouth, and advanced to the second part                            of duodenum. The upper GI endoscopy was                            accomplished without difficulty. The patient                            tolerated the procedure well. Scope In: Scope Out: Findings:      LA Grade C (one or more mucosal breaks continuous between tops of 2 or       more mucosal folds, less than 75% circumference) esophagitis with no       bleeding was found at the gastroesophageal junction.      The stomach was normal.      The examined duodenum was normal.      There was a mild friability at the GE junction with passage of the       endoscope. Impression:               - LA Grade C reflux esophagitis with no bleeding.                           -  Normal stomach.                           - Normal examined duodenum.                           - No specimens collected. Moderate Sedation:      Not Applicable - Patient had care per Anesthesia. Recommendation:           - Return patient to hospital ward for ongoing care.                           - Resume regular diet.                           - Resume Eliquis.                           - Follow HGB and transfuse if necessary.                           - PPI QD as long as she is on anticoagulation.                           - If the anemia and/or melena persist or worsens, a                            repeat colonoscopy will be required. Her last                            colonoscopy was on 10/10/2018 with findings of 5                             small tubular adenomas. Procedure Code(s):        --- Professional ---                           [phone], Esophagogastroduodenoscopy, flexible,                            transoral; diagnostic, including collection of                            specimen(s) by brushing or washing, when performed                            (separate procedure) Diagnosis Code(s):        --- Professional ---                           K21.00, Gastro-esophageal reflux disease with                            esophagitis, without bleeding                           D62, Acute posthemorrhagic anemia  R19.5, Other fecal abnormalities                           K92.1, Melena (includes Hematochezia) CPT copyright 2022 [employer]. All rights reserved. The codes documented in this report are preliminary and upon coder review may  be revised to meet current compliance requirements. Carol Ada, MD Carol Ada, MD 03/13/2022 8:55:10 AM This report has been signed electronically. Number of Addenda: 0

## 2022-03-13 NOTE — Consult Note (Addendum)
Reason for Consult:Melena, anemia, and heme positive stool Referring Physician: [facility]  [name] HPI: This is a 82 year old female with a recent diagnosis of PE with right heart strain, GERD, HTN, and DM admitted for complaints of melena for the past 2-3 weeks.  She was placed on anticoagulation since the diagnosis of her PE, but it was only recently that she reported melena.  Initially she was placed on heparin and then transitioned to Eliquis.  There were no complaints of chest pain or SOB.  The only time she had SOB was when she suffered with the PE.  Her D/C HGB on 01/12/2022 was at 9.8 g/dL.  On admission her HGB was 8.5 g/dL, but with IV hydration it declined to 7.4 g/dL.  She is currently on heparin infusion.  Past Medical History:  Diagnosis Date   Anxiety    Arthritis    knees   Cancer ([place]) 11/2016   Left breast cancer   Depression    Diabetes mellitus without complication ([place])    Diverticulitis    Diverticulosis    bleeding   GERD (gastroesophageal reflux disease)    Hypertension    Hyperthyroidism    nodule on thyroid, Radioactive, now hypo   Hypothyroidism, iatrogenic    After RI now hypo on synthroid   Personal history of radiation therapy     Past Surgical History:  Procedure Laterality Date   ABDOMINAL HYSTERECTOMY  1977   BLADDER SURGERY     bladder suspension   BREAST EXCISIONAL BIOPSY Right    BREAST LUMPECTOMY Left 2018   BREAST LUMPECTOMY WITH RADIOACTIVE SEED AND SENTINEL LYMPH NODE BIOPSY Left 12/11/2016   Procedure: BREAST LUMPECTOMY WITH RADIOACTIVE SEED AND SENTINEL LYMPH NODE BIOPSY;  Surgeon: Rolm Bookbinder, MD;  Location: [facility];  Service: General;  Laterality: Left;   CHOLECYSTECTOMY N/A 03/29/2013   Procedure: LAPAROSCOPIC CHOLECYSTECTOMY WITH INTRAOPERATIVE CHOLANGIOGRAM;  Surgeon: Edward Jolly, MD;  Location: [facility];  Service: General;  Laterality: N/A;   JOINT REPLACEMENT Right 2009   total knee  replacement   ROTATOR CUFF REPAIR Left    TOTAL KNEE REVISION  12/17/2010   Procedure: TOTAL KNEE REVISION;  Surgeon: Dione Plover Aluisio;  Location: [facility];  Service: Orthopedics;  Laterality: Right;    Family History  Problem Relation Age of Onset   Diverticulitis Mother    Alzheimer's disease Mother    Colon cancer Maternal Grandmother    Heart attack Father    Breast cancer Daughter     Social History:  reports that she has never smoked. She has never used smokeless tobacco. She reports current alcohol use. She reports that she does not use drugs.  Allergies:  Allergies  Allergen Reactions   Amitriptyline Other (See Comments)    hallucinations   Aspirin Other (See Comments)    Bleeding   Nsaids Other (See Comments)    Lower GI bleeding   Meperidine Hcl Other (See Comments)    Causes migraines Other reaction(s): migraine   Morphine And Related Rash   Morphine Sulfate Rash    Other reaction(s): rash    Medications: Scheduled:  [MAR Hold] escitalopram  10 mg Oral Daily   [MAR Hold] levothyroxine  100 mcg Oral Daily   [MAR Hold] pantoprazole (PROTONIX) IV  40 mg Intravenous BID   Continuous:  sodium chloride     sodium chloride     [MAR Hold] cefTRIAXone (ROCEPHIN)  IV 2 g (03/13/22 0134)  ferric gluconate (FERRLECIT) IVPB     heparin 950 Units/hr (03/13/22 0700)   [MAR Hold] metronidazole Stopped (03/13/22 0210)   potassium chloride      Results for orders placed or performed during the hospital encounter of 03/12/22 (from the past 24 hour(s))  Urinalysis, Routine w reflex microscopic -Urine, Clean Catch     Status: Abnormal   Collection Time: 03/12/22  3:37 PM  Result Value Ref Range   Color, Urine STRAW (A) YELLOW   APPearance CLEAR CLEAR   Specific Gravity, Urine 1.018 1.005 - 1.030   pH 6.0 5.0 - 8.0   Glucose, UA >=500 (A) NEGATIVE mg/dL   Hgb urine dipstick NEGATIVE NEGATIVE   Bilirubin Urine NEGATIVE NEGATIVE   Ketones, ur NEGATIVE NEGATIVE mg/dL    Protein, ur NEGATIVE NEGATIVE mg/dL   Nitrite NEGATIVE NEGATIVE   Leukocytes,Ua NEGATIVE NEGATIVE   RBC / HPF 0-5 0 - 5 RBC/hpf   WBC, UA 0-5 0 - 5 WBC/hpf   Bacteria, UA NONE SEEN NONE SEEN   Squamous Epithelial / HPF 0-5 0 - 5 /HPF   Mucus PRESENT   CBC with Differential     Status: Abnormal   Collection Time: 03/12/22  5:01 PM  Result Value Ref Range   WBC 10.7 (H) 4.0 - 10.5 K/uL   RBC 3.70 (L) 3.87 - 5.11 MIL/uL   Hemoglobin 8.5 (L) 12.0 - 15.0 g/dL   HCT 29.0 (L) 36.0 - 46.0 %   MCV 78.4 (L) 80.0 - 100.0 fL   MCH 23.0 (L) 26.0 - 34.0 pg   MCHC 29.3 (L) 30.0 - 36.0 g/dL   RDW 16.8 (H) 11.5 - 15.5 %   Platelets 508 (H) 150 - 400 K/uL   nRBC 0.0 0.0 - 0.2 %   Neutrophils Relative % 62 %   Neutro Abs 6.8 1.7 - 7.7 K/uL   Lymphocytes Relative 26 %   Lymphs Abs 2.8 0.7 - 4.0 K/uL   Monocytes Relative 7 %   Monocytes Absolute 0.7 0.1 - 1.0 K/uL   Eosinophils Relative 3 %   Eosinophils Absolute 0.3 0.0 - 0.5 K/uL   Basophils Relative 1 %   Basophils Absolute 0.1 0.0 - 0.1 K/uL   Immature Granulocytes 1 %   Abs Immature Granulocytes 0.07 0.00 - 0.07 K/uL  Comprehensive metabolic panel     Status: Abnormal   Collection Time: 03/12/22  5:01 PM  Result Value Ref Range   Sodium 139 135 - 145 mmol/L   Potassium 3.4 (L) 3.5 - 5.1 mmol/L   Chloride 105 98 - 111 mmol/L   CO2 23 22 - 32 mmol/L   Glucose, Bld 73 70 - 99 mg/dL   BUN 12 8 - 23 mg/dL   Creatinine, Ser 1.32 (H) 0.44 - 1.00 mg/dL   Calcium 9.0 8.9 - 10.3 mg/dL   Total Protein 7.2 6.5 - 8.1 g/dL   Albumin 3.4 (L) 3.5 - 5.0 g/dL   AST 19 15 - 41 U/L   ALT 10 0 - 44 U/L   Alkaline Phosphatase 79 38 - 126 U/L   Total Bilirubin 0.6 0.3 - 1.2 mg/dL   GFR, Estimated 41 (L) >60 mL/min   Anion gap 11 5 - 15  Type and screen [place]     Status: None   Collection Time: 03/12/22  5:01 PM  Result Value Ref Range   ABO/RH(D) O POS    Antibody Screen NEG    Sample Expiration  03/15/2022,2359 Performed at [facility], [place] [address], [place], [place] [address]   Lipase, blood     Status: None   Collection Time: 03/12/22  5:01 PM  Result Value Ref Range   Lipase 31 11 - 51 U/L  POC occult blood, ED     Status: Abnormal   Collection Time: 03/12/22  8:51 PM  Result Value Ref Range   Fecal Occult Bld POSITIVE (A) NEGATIVE  CBG monitoring, ED     Status: None   Collection Time: 03/12/22  8:54 PM  Result Value Ref Range   Glucose-Capillary 72 70 - 99 mg/dL  APTT     Status: None   Collection Time: 03/12/22 11:28 PM  Result Value Ref Range   aPTT 32 24 - 36 seconds  Heparin level (unfractionated)     Status: Abnormal   Collection Time: 03/12/22 11:28 PM  Result Value Ref Range   Heparin Unfractionated >1.10 (H) 0.30 - 0.70 IU/mL  Hemoglobin and hematocrit, blood     Status: Abnormal   Collection Time: 03/12/22 11:28 PM  Result Value Ref Range   Hemoglobin 7.8 (L) 12.0 - 15.0 g/dL   HCT 26.8 (L) 36.0 - 46.0 %  Iron and TIBC     Status: Abnormal   Collection Time: 03/13/22  4:53 AM  Result Value Ref Range   Iron 19 (L) 28 - 170 ug/dL   TIBC 349 250 - 450 ug/dL   Saturation Ratios 6 (L) 10.4 - 31.8 %   UIBC 330 ug/dL  Ferritin     Status: None   Collection Time: 03/13/22  4:53 AM  Result Value Ref Range   Ferritin 11 11 - 307 ng/mL  CBC     Status: Abnormal   Collection Time: 03/13/22  4:53 AM  Result Value Ref Range   WBC 8.6 4.0 - 10.5 K/uL   RBC 3.24 (L) 3.87 - 5.11 MIL/uL   Hemoglobin 7.4 (L) 12.0 - 15.0 g/dL   HCT 24.9 (L) 36.0 - 46.0 %   MCV 76.9 (L) 80.0 - 100.0 fL   MCH 22.8 (L) 26.0 - 34.0 pg   MCHC 29.7 (L) 30.0 - 36.0 g/dL   RDW 16.7 (H) 11.5 - 15.5 %   Platelets 378 150 - 400 K/uL   nRBC 0.0 0.0 - 0.2 %  Comprehensive metabolic panel     Status: Abnormal   Collection Time: 03/13/22  4:53 AM  Result Value Ref Range   Sodium 139 135 - 145 mmol/L   Potassium 3.2 (L) 3.5 - 5.1 mmol/L   Chloride 107 98 - 111 mmol/L    CO2 23 22 - 32 mmol/L   Glucose, Bld 89 70 - 99 mg/dL   BUN 10 8 - 23 mg/dL   Creatinine, Ser 1.26 (H) 0.44 - 1.00 mg/dL   Calcium 8.5 (L) 8.9 - 10.3 mg/dL   Total Protein 6.0 (L) 6.5 - 8.1 g/dL   Albumin 3.3 (L) 3.5 - 5.0 g/dL   AST 12 (L) 15 - 41 U/L   ALT 9 0 - 44 U/L   Alkaline Phosphatase 72 38 - 126 U/L   Total Bilirubin 0.4 0.3 - 1.2 mg/dL   GFR, Estimated 43 (L) >60 mL/min   Anion gap 9 5 - 15  Magnesium     Status: None   Collection Time: 03/13/22  4:53 AM  Result Value Ref Range   Magnesium 1.9 1.7 - 2.4 mg/dL  Glucose, capillary  Status: Abnormal   Collection Time: 03/13/22  7:48 AM  Result Value Ref Range   Glucose-Capillary 109 (H) 70 - 99 mg/dL     CT ABDOMEN PELVIS W CONTRAST  Result Date: 03/12/2022 CLINICAL DATA:  Dark tarry stools and abdominal pain. EXAM: CT ABDOMEN AND PELVIS WITH CONTRAST TECHNIQUE: Multidetector CT imaging of the abdomen and pelvis was performed using the standard protocol following bolus administration of intravenous contrast. RADIATION DOSE REDUCTION: This exam was performed according to the departmental dose-optimization program which includes automated exposure control, adjustment of the mA and/or kV according to patient size and/or use of iterative reconstruction technique. CONTRAST:  81m OMNIPAQUE IOHEXOL 300 MG/ML  SOLN COMPARISON:  March 19, 2020 FINDINGS: Lower chest: No acute abnormality. Hepatobiliary: There is diffuse fatty infiltration of the liver parenchyma. A 4 mm focus of parenchymal low attenuation is seen within the left lobe of the liver. Status post cholecystectomy. No biliary dilatation. Pancreas: Unremarkable. No pancreatic ductal dilatation or surrounding inflammatory changes. Spleen: Normal in size without focal abnormality. Adrenals/Urinary Tract: Adrenal glands are unremarkable. The left kidney is atrophic in appearance. The right kidney is normal in size. Numerous subcentimeter non-obstructing renal calculi are seen  within the left kidney. Several bilateral simple renal cysts are seen. Bladder is unremarkable. Stomach/Bowel: Stomach is within normal limits. Appendix appears normal. No evidence of bowel dilatation. Moderately and thickened mid to distal ascending colon is seen. Noninflamed diverticula are noted throughout the sigmoid colon. Vascular/Lymphatic: Aortic atherosclerosis. No enlarged abdominal or pelvic lymph nodes. Reproductive: Status post hysterectomy. No adnexal masses. Other: No abdominal wall hernia or abnormality. No abdominopelvic ascites. Musculoskeletal: Multilevel degenerative changes seen throughout the lumbar spine, most prominent at the level of L5-S1. IMPRESSION: 1. Moderate severity colitis involving the mid to distal ascending colon. 2. Sigmoid diverticulosis. 3. Hepatic steatosis. 4. Numerous subcentimeter non-obstructing renal calculi within the left kidney. 5. Atrophic left kidney. 6. Several bilateral simple renal cysts. 7. Aortic atherosclerosis. Aortic Atherosclerosis (ICD10-I70.0). Electronically Signed   By: TVirgina NorfolkM.D.   On: 03/12/2022 19:55    ROS:  As stated above in the HPI otherwise negative.  Blood pressure (!) 150/44, pulse 78, temperature (!) 97.2 F (36.2 C), temperature source Temporal, resp. rate 14, height 5' 2"$  (1.575 m), weight 79.2 kg, SpO2 97 %.    PE: Gen: NAD, Alert and Oriented HEENT:  [place]/AT, EOMI Neck: Supple, no LAD Lungs: CTA Bilaterally CV: RRR without M/G/R ABD: Soft, NTND, +BS Ext: No C/C/E  Assessment/Plan: 1) Melena. 2) Heme positive stool. 3) Anemia. 4) History of PE with right heart strain.   An EGD will be performed today.  She is hemodynamically stable.  Given the severity of her prior PE she will remain on heparin for the endoscopy.  Plan: 1) EGD today.   ADDENDUM: Her admission CT scan was reviewed.  There was evidence of an ascending colon colitis.  During my examination she did not report any pain.  This can be a  source for her melena also.  No reports of any diarrhea.  Lundynn Cohoon D 03/13/2022, 8:28 AM

## 2022-03-13 NOTE — ED Provider Notes (Incomplete)
[facility] 4TH FLOOR PROGRESSIVE CARE AND UROLOGY Provider Note   CSN: [id number] Arrival date & time: 03/12/22  1527     History Chief Complaint  Patient presents with  . Rectal Bleeding    [name] is a 82 y.o. female with history of diabetes, diverticulosis, diverticulitis, GERD, hypertension, colitis, PE on Eliquis presents emerged from today for evaluation of dark and tarry stools with diffuse abdominal pain for the past 2 weeks.  Patient reports that she initially had a bright bloody stool with dark stool elevated of the 2 weeks but has not had any since.  She denies any fever.  Reports that she has been more fatigued.  Denies any nausea, vomiting, chest pain, or shortness of breath.  She is currently on Eliquis for a PE she had in December.  She reports that she was told to come over here by Dr. Lorie Apley office because of her low hemoglobin as well as black tarry stools.  She reports that she had a colonoscopy 3 years ago where she had some polyps removed.  Otherwise she has never had any melena before.   Rectal Bleeding Associated symptoms: abdominal pain   Associated symptoms: no fever and no vomiting        Home Medications Prior to Admission medications   Medication Sig Start Date End Date Taking? Authorizing Provider  acetaminophen (TYLENOL) 500 MG tablet Take 500 mg by mouth every 6 (six) hours as needed for moderate pain.   Yes [provider]  amLODipine (NORVASC) 10 MG tablet Take 10 mg by mouth daily.   Yes [provider]  amoxicillin-clavulanate (AUGMENTIN) 500-125 MG tablet Take 500 mg by mouth every 12 (twelve) hours. 02/25/22  Yes [provider]  apixaban (ELIQUIS) 5 MG TABS tablet Take 5 mg by mouth 2 (two) times daily.   Yes [provider]  dapagliflozin propanediol (FARXIGA) 5 MG TABS tablet Take 5 mg by mouth daily. 05/21/21  Yes [provider]  diphenhydrAMINE (BENADRYL) 25 mg capsule Take 1 capsule (25 mg  total) by mouth every 6 (six) hours as needed for itching. 03/22/20  Yes Viona Gilmore D, NP  escitalopram (LEXAPRO) 10 MG tablet Take 10 mg daily by mouth.   Yes [provider]  ferrous sulfate 325 (65 FE) MG tablet Take 325 mg by mouth daily with breakfast. 03/02/22  Yes [provider]  glimepiride (AMARYL) 2 MG tablet Take 2 mg by mouth 2 (two) times daily.   Yes [provider]  hydrochlorothiazide (HYDRODIURIL) 25 MG tablet Take 25 mg by mouth daily.   Yes [provider]  levothyroxine (SYNTHROID) 100 MCG tablet Take 100 mcg by mouth every morning.   Yes [provider]  metoprolol succinate (TOPROL-XL) 50 MG 24 hr tablet Take 50 mg by mouth daily. 03/04/20  Yes [provider]  Probiotic Product (ALIGN) 4 MG CAPS Take 1 capsule by mouth daily.   Yes [provider]  promethazine (PHENERGAN) 12.5 MG tablet Take 12.5 mg by mouth every 6 (six) hours as needed for vomiting or nausea.   Yes [provider]  traMADol (ULTRAM) 50 MG tablet Take 50 mg by mouth every 8 (eight) hours as needed for moderate pain.   Yes [provider]  traZODone (DESYREL) 50 MG tablet Take 0.5-1 tablets (25-50 mg total) by mouth at bedtime as needed for sleep. Patient taking differently: Take 50 mg by mouth at bedtime as needed for sleep. 04/02/20  Yes Love,  Ivan Anchors, PA-C  acetaminophen (TYLENOL) 325 MG tablet Take 1-2 tablets (325-650 mg total) by mouth every 4 (four) hours as needed for mild pain. Patient not taking: Reported on 03/12/2022 04/02/20   Bary Leriche, PA-C      Allergies    Amitriptyline, Aspirin, Nsaids, Meperidine hcl, Morphine and related, and Morphine sulfate    Review of Systems   Review of Systems  Constitutional:  Positive for fatigue. Negative for chills and fever.  Respiratory:  Negative for shortness of breath.   Cardiovascular:  Negative for chest pain.  Gastrointestinal:  Positive for abdominal pain, blood in  stool and hematochezia. Negative for nausea and vomiting.  Genitourinary:  Negative for dysuria and hematuria.    Physical Exam Updated Vital Signs BP (!) 155/67 (BP Location: Right Arm)   Pulse 85   Temp 99.3 F (37.4 C) (Oral)   Resp 18   SpO2 100%  Physical Exam Exam conducted with a chaperone present (Mikayla, Tech).  Constitutional:      General: She is not in acute distress.    Appearance: Normal appearance. She is not toxic-appearing.  HENT:     Head: Normocephalic and atraumatic.  Eyes:     General: No scleral icterus. Cardiovascular:     Rate and Rhythm: Normal rate and regular rhythm.  Pulmonary:     Effort: Pulmonary effort is normal.     Breath sounds: Normal breath sounds.  Abdominal:     General: Bowel sounds are normal.     Palpations: Abdomen is soft.     Tenderness: There is abdominal tenderness. There is no guarding or rebound.     Comments: Diffuse abdominal tenderness palpation.  Soft.  No guarding or rebound noted.  Normal active bowel sounds.  Genitourinary:    Comments: Extern Musculoskeletal:        General: No deformity.     Cervical back: Normal range of motion.  Skin:    General: Skin is warm and dry.  Neurological:     General: No focal deficit present.     Mental Status: She is alert. Mental status is at baseline.     ED Results / Procedures / Treatments   Labs (all labs ordered are listed, but only abnormal results are displayed) Labs Reviewed  CBC WITH DIFFERENTIAL/PLATELET - Abnormal; Notable for the following components:      Result Value   WBC 10.7 (*)    RBC 3.70 (*)    Hemoglobin 8.5 (*)    HCT 29.0 (*)    MCV 78.4 (*)    MCH 23.0 (*)    MCHC 29.3 (*)    RDW 16.8 (*)    Platelets 508 (*)    All other components within normal limits  COMPREHENSIVE METABOLIC PANEL - Abnormal; Notable for the following components:   Potassium 3.4 (*)    Creatinine, Ser 1.32 (*)    Albumin 3.4 (*)    GFR, Estimated 41 (*)    All other  components within normal limits  URINALYSIS, ROUTINE W REFLEX MICROSCOPIC - Abnormal; Notable for the following components:   Color, Urine STRAW (*)    Glucose, UA >=500 (*)    All other components within normal limits  POC OCCULT BLOOD, ED - Abnormal; Notable for the following components:   Fecal Occult Bld POSITIVE (*)    All other components within normal limits  LIPASE, BLOOD  IRON AND TIBC  FERRITIN  CBC  COMPREHENSIVE METABOLIC PANEL  APTT  HEPARIN  LEVEL (UNFRACTIONATED)  HEMOGLOBIN AND HEMATOCRIT, BLOOD  HEMOGLOBIN AND HEMATOCRIT, BLOOD  HEMOGLOBIN AND HEMATOCRIT, BLOOD  MAGNESIUM  CBG MONITORING, ED  TYPE AND SCREEN  TYPE AND SCREEN    EKG None  Radiology CT ABDOMEN PELVIS W CONTRAST  Result Date: 03/12/2022 CLINICAL DATA:  Dark tarry stools and abdominal pain. EXAM: CT ABDOMEN AND PELVIS WITH CONTRAST TECHNIQUE: Multidetector CT imaging of the abdomen and pelvis was performed using the standard protocol following bolus administration of intravenous contrast. RADIATION DOSE REDUCTION: This exam was performed according to the departmental dose-optimization program which includes automated exposure control, adjustment of the mA and/or kV according to patient size and/or use of iterative reconstruction technique. CONTRAST:  55m OMNIPAQUE IOHEXOL 300 MG/ML  SOLN COMPARISON:  March 19, 2020 FINDINGS: Lower chest: No acute abnormality. Hepatobiliary: There is diffuse fatty infiltration of the liver parenchyma. A 4 mm focus of parenchymal low attenuation is seen within the left lobe of the liver. Status post cholecystectomy. No biliary dilatation. Pancreas: Unremarkable. No pancreatic ductal dilatation or surrounding inflammatory changes. Spleen: Normal in size without focal abnormality. Adrenals/Urinary Tract: Adrenal glands are unremarkable. The left kidney is atrophic in appearance. The right kidney is normal in size. Numerous subcentimeter non-obstructing renal calculi are seen  within the left kidney. Several bilateral simple renal cysts are seen. Bladder is unremarkable. Stomach/Bowel: Stomach is within normal limits. Appendix appears normal. No evidence of bowel dilatation. Moderately and thickened mid to distal ascending colon is seen. Noninflamed diverticula are noted throughout the sigmoid colon. Vascular/Lymphatic: Aortic atherosclerosis. No enlarged abdominal or pelvic lymph nodes. Reproductive: Status post hysterectomy. No adnexal masses. Other: No abdominal wall hernia or abnormality. No abdominopelvic ascites. Musculoskeletal: Multilevel degenerative changes seen throughout the lumbar spine, most prominent at the level of L5-S1. IMPRESSION: 1. Moderate severity colitis involving the mid to distal ascending colon. 2. Sigmoid diverticulosis. 3. Hepatic steatosis. 4. Numerous subcentimeter non-obstructing renal calculi within the left kidney. 5. Atrophic left kidney. 6. Several bilateral simple renal cysts. 7. Aortic atherosclerosis. Aortic Atherosclerosis (ICD10-I70.0). Electronically Signed   By: TVirgina NorfolkM.D.   On: 03/12/2022 19:55    Procedures Procedures  {Document cardiac monitor, telemetry assessment procedure when appropriate:1}  Medications Ordered in ED Medications  escitalopram (LEXAPRO) tablet 10 mg (has no administration in time range)  levothyroxine (SYNTHROID) tablet 100 mcg (has no administration in time range)  traZODone (DESYREL) tablet 25-50 mg (50 mg Oral Given 03/12/22 2332)  cefTRIAXone (ROCEPHIN) 2 g in sodium chloride 0.9 % 100 mL IVPB (has no administration in time range)  metroNIDAZOLE (FLAGYL) IVPB 500 mg (has no administration in time range)  pantoprazole (PROTONIX) injection 40 mg (40 mg Intravenous Given 03/12/22 2333)  lactated ringers 1,000 mL with potassium chloride 40 mEq infusion ( Intravenous New Bag/Given 03/12/22 2342)  heparin ADULT infusion 100 units/mL (25000 units/2571m (950 Units/hr Intravenous New Bag/Given 03/12/22  2353)  acetaminophen (TYLENOL) tablet 500 mg (has no administration in time range)  prochlorperazine (COMPAZINE) injection 5 mg (has no administration in time range)  iohexol (OMNIPAQUE) 300 MG/ML solution 80 mL (80 mLs Intravenous Contrast Given 03/12/22 1917)  potassium chloride SA (KLOR-CON M) CR tablet 20 mEq (20 mEq Oral Given 03/12/22 2119)    ED Course/ Medical Decision Making/ A&P   {   Click here for ABCD2, HEART and other calculatorsREFRESH Note before signing :1}  Medical Decision Making Amount and/or Complexity of Data Reviewed Labs: ordered. Radiology: ordered.  Risk Prescription drug management. Decision regarding hospitalization.   ***  {Document critical care time when appropriate:1} {Document review of labs and clinical decision tools ie heart score, Chads2Vasc2 etc:1}  {Document your independent review of radiology images, and any outside records:1} {Document your discussion with family members, caretakers, and with consultants:1} {Document social determinants of health affecting pt's care:1} {Document your decision making why or why not admission, treatments were needed:1} Final Clinical Impression(s) / ED Diagnoses Final diagnoses:  Melena  Thrombocytosis  Colitis  Hypokalemia    Rx / DC Orders ED Discharge Orders     None

## 2022-03-13 NOTE — Anesthesia Postprocedure Evaluation (Signed)
Anesthesia Post Note  Patient: [name]  Procedure(s) Performed: ESOPHAGOGASTRODUODENOSCOPY (EGD) WITH PROPOFOL     Patient location during evaluation: PACU Anesthesia Type: MAC Level of consciousness: awake and alert Pain management: pain level controlled Vital Signs Assessment: post-procedure vital signs reviewed and stable Respiratory status: spontaneous breathing, nonlabored ventilation, respiratory function stable and patient connected to nasal cannula oxygen Cardiovascular status: stable and blood pressure returned to baseline Postop Assessment: no apparent nausea or vomiting Anesthetic complications: no   No notable events documented.  Last Vitals:  Vitals:   03/13/22 0900 03/13/22 0910  BP: (!) 140/62 (!) 150/64  Pulse: 77 79  Resp: 19 18  Temp:    SpO2: 92% 96%    Last Pain:  Vitals:   03/13/22 0910  TempSrc:   PainSc: 0-No pain                 March Rummage Beatris Belen

## 2022-03-13 NOTE — Transfer of Care (Signed)
Immediate Anesthesia Transfer of Care Note  Patient: PUNEET MEGEE  Procedure(s) Performed: ESOPHAGOGASTRODUODENOSCOPY (EGD) WITH PROPOFOL  Patient Location: Endoscopy Unit  Anesthesia Type:MAC  Level of Consciousness: awake, alert , oriented, and patient cooperative  Airway & Oxygen Therapy: Patient Spontanous Breathing and Patient connected to nasal cannula oxygen  Post-op Assessment: Report given to RN and Post -op Vital signs reviewed and stable  Post vital signs: Reviewed and stable  Last Vitals:  Vitals Value Taken Time  BP    Temp    Pulse 76 03/13/22 0852  Resp 28 03/13/22 0852  SpO2 96 % 03/13/22 0852  Vitals shown include unvalidated device data.  Last Pain:  Vitals:   03/13/22 0757  TempSrc: Temporal  PainSc: 2          Complications: No notable events documented.

## 2022-03-13 NOTE — Progress Notes (Signed)
PROGRESS NOTE  [name]  [id number] DOB: [birth year] DOA: 03/12/2022 PCP: [name], MD   Brief Narrative: Patient is a 57 female with history of recent pulmonary embolism with right heart strain/DVT on last December on Eliquis, hypertension, diabetes type 2, history of breast cancer who presented to the emergency department after her gastroenterologist Dr. Collene Mares center for further evaluation of upper GI bleed.  Report of bright red blood per rectum and melanotic stools 2 weeks ago followed by intermittent upper abdominal pain, worse after meals.  Underwent EGD today.  On presentation ,hemoglobin was 8.5.  FOBT was positive and there was upper abdominal tenderness.  Hemodynamically stable.  Assessment & Plan:  Principal Problem:   Symptomatic anemia   Upper GI bleed: Report of bright red blood per rectum 2 weeks ago followed by melanotic stools, abdominal pain. Presented with hemoglobin in the range of 8.5.  FOBT positive. GI following ,sp  EGD today.  Found to have nonbleeding esophagitis. continue Protonix.  Iron deficiency anemia: Hemoglobin was 9.8 on December 2023.  Presented with hemoglobin of 8.5.  Dropped to the range of 7 today.  Will closely monitor hemoglobin.  Will transfuse if hemoglobin drops less than 7.  Iron studies showed iron deficiency, will give IV iron Continue oral supplements on discharge.  Abdominal pain/colitis: CT abdomen/pelvis showed Moderate severity colitis involving the mid to distal ascending colon.  Patient was complaining of upper abdominal pain worse with meals.  Patient is afebrile.  Empirically started on Rocephin and Flagyl.  Will get GI pathogen panel if she has bowel movement.  She needs colonoscopy in 6 to 8 weeks. Currently on full liquid diet, gradually advance  Hypokalemia: Supplemented with potassium.  CKD stage IIIb: Currently kidney function at baseline.  Baseline creatinine 1.3.  History of PE: Diagnosed with PE with right  heart strain and also DVT on December 23.  On Eliquis at home.  Resumed       DVT prophylaxis:Eliquis     Code Status: Full Code  Family Communication: Daughter and husband at bedside  Patient status:Obs  Patient is from :Home  Anticipated discharge [id number]  Estimated DC date: Tomorrow   Consultants: GI  Procedures:EGD  Antimicrobials:  Anti-infectives (From admission, onward)    Start     Dose/Rate Route Frequency Ordered Stop   03/12/22 2200  [MAR Hold]  cefTRIAXone (ROCEPHIN) 2 g in sodium chloride 0.9 % 100 mL IVPB        (MAR Hold since Fri 03/13/2022 at 0756.Hold Reason: Transfer to a Procedural area)   2 g 200 mL/hr over 30 Minutes Intravenous Every 24 hours 03/12/22 2116     03/12/22 2200  [MAR Hold]  metroNIDAZOLE (FLAGYL) IVPB 500 mg        (MAR Hold since Fri 03/13/2022 at 0756.Hold Reason: Transfer to a Procedural area)   500 mg 100 mL/hr over 60 Minutes Intravenous Every 12 hours 03/12/22 2116         Subjective: Patient seen and examined at bedside.  She just came from EGD.  Family at bedside.  She still having some mild abdominal discomfort around the center.  No nausea, vomiting or diarrhea  Objective: Vitals:   03/12/22 2241 03/13/22 0213 03/13/22 0533 03/13/22 0757  BP: (!) 155/67 135/61 138/67 (!) 150/44  Pulse: 85 80 79 78  Resp: 18 20 20 14  $ Temp: 99.3 F (37.4 C) 98.8 F (37.1 C) 98.6 F (37 C) (!) 97.2 F (36.2 C)  TempSrc: Oral  Oral Oral Temporal  SpO2: 100% 95% 96% 97%  Weight: 79.2 kg   79.2 kg  Height: 5' 2"$  (1.575 m)   5' 2"$  (1.575 m)    Intake/Output Summary (Last 24 hours) at 03/13/2022 0805 Last data filed at 03/13/2022 0700 Gross per 24 hour  Intake 625.46 ml  Output --  Net 625.46 ml   Filed Weights   03/12/22 2241 03/13/22 0757  Weight: 79.2 kg 79.2 kg    Examination:  General exam: Overall comfortable, not in distress, pleasant elderly female, obese HEENT: PERRL Respiratory system:  no wheezes or crackles   Cardiovascular system: S1 & S2 heard, RRR.  Gastrointestinal system: Abdomen is nondistended, soft and has some mild generalized tenderness Central nervous system: Alert and oriented Extremities: No edema, no clubbing ,no cyanosis Skin: No rashes, no ulcers,no icterus     Data Reviewed: I have personally reviewed following labs and imaging studies  CBC: Recent Labs  Lab 03/12/22 1701 03/12/22 2328 03/13/22 0453  WBC 10.7*  --  8.6  NEUTROABS 6.8  --   --   HGB 8.5* 7.8* 7.4*  HCT 29.0* 26.8* 24.9*  MCV 78.4*  --  76.9*  PLT 508*  --  [id number]   Basic Metabolic Panel: Recent Labs  Lab 03/12/22 1701 03/13/22 0453  NA 139 139  K 3.4* 3.2*  CL 105 107  CO2 23 23  GLUCOSE 73 89  BUN 12 10  CREATININE 1.32* 1.26*  CALCIUM 9.0 8.5*  MG  --  1.9     No results found for this or any previous visit (from the past 240 hour(s)).   Radiology Studies: CT ABDOMEN PELVIS W CONTRAST  Result Date: 03/12/2022 CLINICAL DATA:  Dark tarry stools and abdominal pain. EXAM: CT ABDOMEN AND PELVIS WITH CONTRAST TECHNIQUE: Multidetector CT imaging of the abdomen and pelvis was performed using the standard protocol following bolus administration of intravenous contrast. RADIATION DOSE REDUCTION: This exam was performed according to the departmental dose-optimization program which includes automated exposure control, adjustment of the mA and/or kV according to patient size and/or use of iterative reconstruction technique. CONTRAST:  61m OMNIPAQUE IOHEXOL 300 MG/ML  SOLN COMPARISON:  March 19, 2020 FINDINGS: Lower chest: No acute abnormality. Hepatobiliary: There is diffuse fatty infiltration of the liver parenchyma. A 4 mm focus of parenchymal low attenuation is seen within the left lobe of the liver. Status post cholecystectomy. No biliary dilatation. Pancreas: Unremarkable. No pancreatic ductal dilatation or surrounding inflammatory changes. Spleen: Normal in size without focal abnormality.  Adrenals/Urinary Tract: Adrenal glands are unremarkable. The left kidney is atrophic in appearance. The right kidney is normal in size. Numerous subcentimeter non-obstructing renal calculi are seen within the left kidney. Several bilateral simple renal cysts are seen. Bladder is unremarkable. Stomach/Bowel: Stomach is within normal limits. Appendix appears normal. No evidence of bowel dilatation. Moderately and thickened mid to distal ascending colon is seen. Noninflamed diverticula are noted throughout the sigmoid colon. Vascular/Lymphatic: Aortic atherosclerosis. No enlarged abdominal or pelvic lymph nodes. Reproductive: Status post hysterectomy. No adnexal masses. Other: No abdominal wall hernia or abnormality. No abdominopelvic ascites. Musculoskeletal: Multilevel degenerative changes seen throughout the lumbar spine, most prominent at the level of L5-S1. IMPRESSION: 1. Moderate severity colitis involving the mid to distal ascending colon. 2. Sigmoid diverticulosis. 3. Hepatic steatosis. 4. Numerous subcentimeter non-obstructing renal calculi within the left kidney. 5. Atrophic left kidney. 6. Several bilateral simple renal cysts. 7. Aortic atherosclerosis. Aortic Atherosclerosis (ICD10-I70.0). Electronically Signed   By: THoover Browns  Houston M.D.   On: 03/12/2022 19:55    Scheduled Meds:  [MAR Hold] escitalopram  10 mg Oral Daily   [MAR Hold] levothyroxine  100 mcg Oral Daily   [MAR Hold] pantoprazole (PROTONIX) IV  40 mg Intravenous BID   Continuous Infusions:  sodium chloride     [MAR Hold] cefTRIAXone (ROCEPHIN)  IV 2 g (03/13/22 0134)   heparin 950 Units/hr (03/13/22 0700)   lactated ringers 1,000 mL with potassium chloride 40 mEq infusion 50 mL/hr at 03/13/22 0700   [MAR Hold] metronidazole Stopped (03/13/22 0210)     LOS: 0 days   Shelly Coss, MD [facility] P2/16/2024, 8:05 AM

## 2022-03-13 NOTE — Anesthesia Preprocedure Evaluation (Signed)
Anesthesia Evaluation  Patient identified by MRN, date of birth, ID band Patient awake    Reviewed: Allergy & Precautions, NPO status , Patient's Chart, lab work & pertinent test results  Airway Mallampati: II  TM Distance: >3 FB Neck ROM: Full    Dental no notable dental hx.    Pulmonary neg pulmonary ROS   Pulmonary exam normal        Cardiovascular hypertension, Pt. on medications and Pt. on home beta blockers +CHF   Rhythm:Regular Rate:Normal  ECHO 12/23:   1. Right ventricular systolic function is moderately reduced. The right ventricular size is mildly enlarged. There is moderately elevated pulmonary artery systolic pressure. The estimated right ventricular systolic pressure is [id number] mmHg. There is free wall hypokinesis with normal motion at apex, consistent with McConnell's sign, which can be seen in PE. Recommend ruling out PE  2. Left ventricular ejection fraction, by estimation, is 70 to 75%. The left ventricle has hyperdynamic function. The left ventricle has no regional wall motion abnormalities. There is mild left ventricular hypertrophy. Left ventricular diastolic parameters are consistent with Grade I diastolic dysfunction (impaired relaxation). There is the interventricular septum is flattened in systole and diastole, consistent with right ventricular pressure and volume overload.  3. The mitral valve is normal in structure. No evidence of mitral valve regurgitation.  4. Tricuspid valve regurgitation is mild to moderate.  5. The aortic valve is tricuspid. Aortic valve regurgitation is not visualized. No aortic stenosis is present.  6. The inferior vena cava is normal in size with greater than 50% respiratory variability, suggesting right atrial pressure of 3 mmHg    Neuro/Psych   Anxiety Depression    negative neurological ROS     GI/Hepatic Neg liver ROS,GERD  ,,GIB   Endo/Other  diabetes, Type 2, Oral  Hypoglycemic AgentsHypothyroidism    Renal/GU   negative genitourinary   Musculoskeletal  (+) Arthritis , Osteoarthritis,    Abdominal Normal abdominal exam  (+)   Peds  Hematology  (+) Blood dyscrasia, anemia   Anesthesia Other Findings   Reproductive/Obstetrics                             Anesthesia Physical Anesthesia Plan  ASA: 2  Anesthesia Plan: MAC   Post-op Pain Management:    Induction: Intravenous  PONV Risk Score and Plan: 2 and Propofol infusion and Treatment may vary due to age or medical condition  Airway Management Planned: Simple Face Mask, Natural Airway and Nasal Cannula  Additional Equipment: None  Intra-op Plan:   Post-operative Plan:   Informed Consent: I have reviewed the patients History and Physical, chart, labs and discussed the procedure including the risks, benefits and alternatives for the proposed anesthesia with the patient or authorized representative who has indicated his/her understanding and acceptance.     Dental advisory given  Plan Discussed with: CRNA  Anesthesia Plan Comments: (Lab Results      Component                Value               Date                      WBC                      8.6  03/13/2022                HGB                      7.4 (L)             03/13/2022                HCT                      24.9 (L)            03/13/2022                MCV                      76.9 (L)            03/13/2022                PLT                      378                 03/13/2022           )       Anesthesia Quick Evaluation

## 2022-03-13 NOTE — ED Provider Notes (Signed)
[facility] Provider Note   CSN: [id number] Arrival date & time: 03/12/22  1527     History Chief Complaint  Patient presents with   Rectal Bleeding    [name] is a 82 y.o. female with history of diabetes, diverticulosis, diverticulitis, GERD, hypertension, colitis, PE on Eliquis presents emerged from today for evaluation of dark and tarry stools with diffuse abdominal pain for the past 2 weeks.  Patient reports that she initially had a bright bloody stool with dark stool elevated of the 2 weeks but has not had any since.  She denies any fever.  Reports that she has been more fatigued.  Denies any nausea, vomiting, chest pain, or shortness of breath.  She is currently on Eliquis for a PE she had in December.  She reports that she was told to come over here by Dr. Lorie Apley office because of her low hemoglobin as well as black tarry stools.  She reports that she had a colonoscopy 3 years ago where she had some polyps removed.  Otherwise she has never had any melena before.   Rectal Bleeding Associated symptoms: abdominal pain   Associated symptoms: no fever and no vomiting        Home Medications Prior to Admission medications   Medication Sig Start Date End Date Taking? Authorizing Provider  acetaminophen (TYLENOL) 500 MG tablet Take 500 mg by mouth every 6 (six) hours as needed for moderate pain.   Yes [provider]  amLODipine (NORVASC) 10 MG tablet Take 10 mg by mouth daily.   Yes [provider]  amoxicillin-clavulanate (AUGMENTIN) 500-125 MG tablet Take 500 mg by mouth every 12 (twelve) hours. 02/25/22  Yes [provider]  apixaban (ELIQUIS) 5 MG TABS tablet Take 5 mg by mouth 2 (two) times daily.   Yes [provider]  dapagliflozin propanediol (FARXIGA) 5 MG TABS tablet Take 5 mg by mouth daily. 05/21/21  Yes [provider]  diphenhydrAMINE (BENADRYL) 25 mg capsule Take 1 capsule (25 mg  total) by mouth every 6 (six) hours as needed for itching. 03/22/20  Yes Viona Gilmore D, NP  escitalopram (LEXAPRO) 10 MG tablet Take 10 mg daily by mouth.   Yes [provider]  ferrous sulfate 325 (65 FE) MG tablet Take 325 mg by mouth daily with breakfast. 03/02/22  Yes [provider]  glimepiride (AMARYL) 2 MG tablet Take 2 mg by mouth 2 (two) times daily.   Yes [provider]  hydrochlorothiazide (HYDRODIURIL) 25 MG tablet Take 25 mg by mouth daily.   Yes [provider]  levothyroxine (SYNTHROID) 100 MCG tablet Take 100 mcg by mouth every morning.   Yes [provider]  metoprolol succinate (TOPROL-XL) 50 MG 24 hr tablet Take 50 mg by mouth daily. 03/04/20  Yes [provider]  Probiotic Product (ALIGN) 4 MG CAPS Take 1 capsule by mouth daily.   Yes [provider]  promethazine (PHENERGAN) 12.5 MG tablet Take 12.5 mg by mouth every 6 (six) hours as needed for vomiting or nausea.   Yes [provider]  traMADol (ULTRAM) 50 MG tablet Take 50 mg by mouth every 8 (eight) hours as needed for moderate pain.   Yes [provider]  traZODone (DESYREL) 50 MG tablet Take 0.5-1 tablets (25-50 mg total) by mouth at bedtime as needed for sleep. Patient taking differently: Take 50 mg by mouth at bedtime as needed for sleep. 04/02/20  Yes Love,  Ivan Anchors, PA-C  acetaminophen (TYLENOL) 325 MG tablet Take 1-2 tablets (325-650 mg total) by mouth every 4 (four) hours as needed for mild pain. Patient not taking: Reported on 03/12/2022 04/02/20   Bary Leriche, PA-C      Allergies    Amitriptyline, Aspirin, Nsaids, Meperidine hcl, Morphine and related, and Morphine sulfate    Review of Systems   Review of Systems  Constitutional:  Positive for fatigue. Negative for chills and fever.  Respiratory:  Negative for shortness of breath.   Cardiovascular:  Negative for chest pain.  Gastrointestinal:  Positive for abdominal pain, blood in  stool and hematochezia. Negative for nausea and vomiting.  Genitourinary:  Negative for dysuria and hematuria.    Physical Exam Updated Vital Signs BP (!) 155/67 (BP Location: Right Arm)   Pulse 85   Temp 99.3 F (37.4 C) (Oral)   Resp 18   SpO2 100%  Physical Exam Exam conducted with a chaperone present (Mikayla, Tech).  Constitutional:      General: She is not in acute distress.    Appearance: Normal appearance. She is not toxic-appearing.  HENT:     Head: Normocephalic and atraumatic.  Eyes:     General: No scleral icterus. Cardiovascular:     Rate and Rhythm: Normal rate and regular rhythm.  Pulmonary:     Effort: Pulmonary effort is normal.     Breath sounds: Normal breath sounds.  Abdominal:     General: Bowel sounds are normal.     Palpations: Abdomen is soft.     Tenderness: There is abdominal tenderness. There is no guarding or rebound.     Comments: Diffuse abdominal tenderness palpation.  Soft.  No guarding or rebound noted.  Normal active bowel sounds.  Genitourinary:    Comments: External hemorrhoids presents. Melanic stool present. No bright red blood.  Musculoskeletal:        General: No deformity.     Cervical back: Normal range of motion.  Skin:    General: Skin is warm and dry.  Neurological:     General: No focal deficit present.     Mental Status: She is alert. Mental status is at baseline.     ED Results / Procedures / Treatments   Labs (all labs ordered are listed, but only abnormal results are displayed) Labs Reviewed  CBC WITH DIFFERENTIAL/PLATELET - Abnormal; Notable for the following components:      Result Value   WBC 10.7 (*)    RBC 3.70 (*)    Hemoglobin 8.5 (*)    HCT 29.0 (*)    MCV 78.4 (*)    MCH 23.0 (*)    MCHC 29.3 (*)    RDW 16.8 (*)    Platelets 508 (*)    All other components within normal limits  COMPREHENSIVE METABOLIC PANEL - Abnormal; Notable for the following components:   Potassium 3.4 (*)    Creatinine, Ser  1.32 (*)    Albumin 3.4 (*)    GFR, Estimated 41 (*)    All other components within normal limits  URINALYSIS, ROUTINE W REFLEX MICROSCOPIC - Abnormal; Notable for the following components:   Color, Urine STRAW (*)    Glucose, UA >=500 (*)    All other components within normal limits  POC OCCULT BLOOD, ED - Abnormal; Notable for the following components:   Fecal Occult Bld POSITIVE (*)    All other components within normal limits  LIPASE, BLOOD  IRON AND TIBC  FERRITIN  CBC  COMPREHENSIVE METABOLIC PANEL  APTT  HEPARIN LEVEL (UNFRACTIONATED)  HEMOGLOBIN AND HEMATOCRIT, BLOOD  HEMOGLOBIN AND HEMATOCRIT, BLOOD  HEMOGLOBIN AND HEMATOCRIT, BLOOD  MAGNESIUM  CBG MONITORING, ED  TYPE AND SCREEN  TYPE AND SCREEN    EKG None  Radiology CT ABDOMEN PELVIS W CONTRAST  Result Date: 03/12/2022 CLINICAL DATA:  Dark tarry stools and abdominal pain. EXAM: CT ABDOMEN AND PELVIS WITH CONTRAST TECHNIQUE: Multidetector CT imaging of the abdomen and pelvis was performed using the standard protocol following bolus administration of intravenous contrast. RADIATION DOSE REDUCTION: This exam was performed according to the departmental dose-optimization program which includes automated exposure control, adjustment of the mA and/or kV according to patient size and/or use of iterative reconstruction technique. CONTRAST:  49m OMNIPAQUE IOHEXOL 300 MG/ML  SOLN COMPARISON:  March 19, 2020 FINDINGS: Lower chest: No acute abnormality. Hepatobiliary: There is diffuse fatty infiltration of the liver parenchyma. A 4 mm focus of parenchymal low attenuation is seen within the left lobe of the liver. Status post cholecystectomy. No biliary dilatation. Pancreas: Unremarkable. No pancreatic ductal dilatation or surrounding inflammatory changes. Spleen: Normal in size without focal abnormality. Adrenals/Urinary Tract: Adrenal glands are unremarkable. The left kidney is atrophic in appearance. The right kidney is normal  in size. Numerous subcentimeter non-obstructing renal calculi are seen within the left kidney. Several bilateral simple renal cysts are seen. Bladder is unremarkable. Stomach/Bowel: Stomach is within normal limits. Appendix appears normal. No evidence of bowel dilatation. Moderately and thickened mid to distal ascending colon is seen. Noninflamed diverticula are noted throughout the sigmoid colon. Vascular/Lymphatic: Aortic atherosclerosis. No enlarged abdominal or pelvic lymph nodes. Reproductive: Status post hysterectomy. No adnexal masses. Other: No abdominal wall hernia or abnormality. No abdominopelvic ascites. Musculoskeletal: Multilevel degenerative changes seen throughout the lumbar spine, most prominent at the level of L5-S1. IMPRESSION: 1. Moderate severity colitis involving the mid to distal ascending colon. 2. Sigmoid diverticulosis. 3. Hepatic steatosis. 4. Numerous subcentimeter non-obstructing renal calculi within the left kidney. 5. Atrophic left kidney. 6. Several bilateral simple renal cysts. 7. Aortic atherosclerosis. Aortic Atherosclerosis (ICD10-I70.0). Electronically Signed   By: TVirgina NorfolkM.D.   On: 03/12/2022 19:55    Procedures Procedures   Medications Ordered in ED Medications  escitalopram (LEXAPRO) tablet 10 mg (has no administration in time range)  levothyroxine (SYNTHROID) tablet 100 mcg (has no administration in time range)  traZODone (DESYREL) tablet 25-50 mg (50 mg Oral Given 03/12/22 2332)  cefTRIAXone (ROCEPHIN) 2 g in sodium chloride 0.9 % 100 mL IVPB (has no administration in time range)  metroNIDAZOLE (FLAGYL) IVPB 500 mg (has no administration in time range)  pantoprazole (PROTONIX) injection 40 mg (40 mg Intravenous Given 03/12/22 2333)  lactated ringers 1,000 mL with potassium chloride 40 mEq infusion ( Intravenous New Bag/Given 03/12/22 2342)  heparin ADULT infusion 100 units/mL (25000 units/2575m (950 Units/hr Intravenous New Bag/Given 03/12/22 2353)   acetaminophen (TYLENOL) tablet 500 mg (has no administration in time range)  prochlorperazine (COMPAZINE) injection 5 mg (has no administration in time range)  iohexol (OMNIPAQUE) 300 MG/ML solution 80 mL (80 mLs Intravenous Contrast Given 03/12/22 1917)  potassium chloride SA (KLOR-CON M) CR tablet 20 mEq (20 mEq Oral Given 03/12/22 2119)    ED Course/ Medical Decision Making/ A&P                           Medical Decision Making Amount and/or Complexity of Data Reviewed Labs: ordered. Radiology: ordered.  Risk Prescription drug management. Decision regarding hospitalization.   81 year old female presents the emergency room today for evaluation of melena.  Differential diagnosis includes is limited to upper GI bleed, malignancy, anticoagulant induced GI bleeding, drug-induced, colitis, PUD, IBD.  Vital signs show slightly elevated blood pressure 135/67 otherwise afebrile, pulse rate, satting on room air without increased work of breathing.  Exam as noted above.  I independently reviewed and interpreted the patient's labs.  CBC shows elevated white blood cell count of 10.7.  Hemoglobin at 8.5.  It was previously at 8.0 per patient.  It appears her baseline is slightly below 10.  It does show she has new thrombocytosis with platelets being 508.  Possibly due to inflammation?  Will mention this to internal medicine.  Urine shows straw-colored urine with glucose present.  No other signs of infection.  Lipase within normal limits.  CBG within normal limits.  CMP shows mildly decreased potassium 3-4.  Creatinine is actually improved and is not 1.32.  Slightly decreased albumin at 3.4.  Fecal occult positive.  CT imaging shows 1. Moderate severity colitis involving the mid to distal ascending colon. 2. Sigmoid diverticulosis. 3. Hepatic steatosis. 4. Numerous subcentimeter non-obstructing renal calculi within the left kidney. 5. Atrophic left kidney. 6. Several bilateral simple renal cysts. 7.  Aortic atherosclerosis..  I spoke with Dr. Collene Mares with GI who is her gastroenterologist and sent her over here.  She would like the patient admitted with n.p.o. after midnight for endoscopy tomorrow.  She reports her hemoglobin was 8 a few days prior and was concerned about worsening since she continues to have melena.  Asked for medical admission.  I discussed admission with the patient at bedside.  She is amenable to this.  Final Clinical Impression(s) / ED Diagnoses Final diagnoses:  Melena  Thrombocytosis  Colitis  Hypokalemia    Rx / DC Orders ED Discharge Orders     None         Sherrell Puller, PA-C 03/13/22 0012    Tegeler, Gwenyth Allegra, MD 03/13/22 [phone]

## 2022-03-13 NOTE — Progress Notes (Unsigned)
[name]  [phone] [phone]) [phone]   NB: Patient did not show for he 03/15/2017 appt  ID: [name] DOB: December 14, 1940  MR#: [id number]  [id number]  Patient Care Team: [name] (Family Medicine) Magrinat, Virgie Dad, MD (Inactive) as Consulting Physician (Oncology) Alyssa Kaufman, MD as Consulting Physician (Orthopedic Surgery) Alyssa Craver, MD as Consulting Physician (Gastroenterology) Alyssa Pigg, MD as Consulting Physician (Dermatology) Alyssa Rhodes, MD (Inactive) as Consulting Physician (Urology) Alyssa Rudd, MD as Consulting Physician (Radiation Oncology) Alyssa Bookbinder, MD as Consulting Physician ([facility])   CHIEF COMPLAINT: estrogen receptor positive breast cancer  CURRENT TREATMENT: Observation  INTERVAL HISTORY:  [name] returns today for telephone visit. She didn't answer.     COVID 19 VACCINATION STATUS: Had Moderna vaccine x2 then had Covid October 2021, did receive antibiotic infusion; had COVID again subsequently and again received an infusion   HISTORY OF CURRENT ILLNESS:  From the original intake note:  [name] initially palpated a lump to her left breast while watching TV which she brought to medical attention and was evaluated 1 week following by her PCP, [name]. She then had an unilateral diagnostic left mammography with tomography and CAD and left breast ultrasonography at [facility] on 11/17/2016 showing a 1.6 cm mass in the upper left breast suspicous for malignancy. The left axilla was benign.  Accordingly on 11/20/2016, she proceeded to biopsy of the left breast area in question. The pathology from this procedure showed [id number]): invasive mammary carcinoma. Prognostic indicators: ER: 100% positive; PR: 5% positive; Both with strong staining intensity. Proliferation marker Ki67: 15% ; HER-2: negative.  The patient's subsequent history is as detailed  below.   PAST MEDICAL HISTORY: Past Medical History:  Diagnosis Date   Anxiety    Arthritis    knees   Cancer ([place]) 11/2016   Left breast cancer   Depression    Diabetes mellitus without complication ([place])    Diverticulitis    Diverticulosis    bleeding   GERD (gastroesophageal reflux disease)    Hypertension    Hyperthyroidism    nodule on thyroid, Radioactive, now hypo   Hypothyroidism, iatrogenic    After RI now hypo on synthroid   Personal history of radiation therapy     PAST SURGICAL HISTORY: Past Surgical History:  Procedure Laterality Date   ABDOMINAL HYSTERECTOMY  1977   BLADDER SURGERY     bladder suspension   BREAST EXCISIONAL BIOPSY Right    BREAST LUMPECTOMY Left 2018   BREAST LUMPECTOMY WITH RADIOACTIVE SEED AND SENTINEL LYMPH NODE BIOPSY Left 12/11/2016   Procedure: BREAST LUMPECTOMY WITH RADIOACTIVE SEED AND SENTINEL LYMPH NODE BIOPSY;  Surgeon: Alyssa Bookbinder, MD;  Location: [facility];  Service: [facility];  Laterality: Left;   CHOLECYSTECTOMY N/A 03/29/2013   Procedure: LAPAROSCOPIC CHOLECYSTECTOMY WITH INTRAOPERATIVE CHOLANGIOGRAM;  Surgeon: Edward Jolly, MD;  Location: [facility];  Service: [facility];  Laterality: N/A;   JOINT REPLACEMENT Right 2009   total knee replacement   ROTATOR CUFF REPAIR Left    TOTAL KNEE REVISION  12/17/2010   Procedure: TOTAL KNEE REVISION;  Surgeon: Dione Plover Aluisio;  Location: [facility];  Service: Orthopedics;  Laterality: Right;    FAMILY HISTORY: Family History  Problem Relation Age of Onset   Diverticulitis Mother    Alzheimer's disease Mother    Colon cancer Maternal Grandmother    Heart attack Father    Breast cancer Daughter   Her father died  at age 101 from heart failure. Her mother died at age 76 from Alzheimer's. She has 1 sister and no brothers. Her maternal grandmother had colon cancer and she is unsure of what age she was diagnosed with colon cancer. Her father was diagnosed with skin cancer in  his late 26's, and she notes that he didn't have melanoma. Her daughter had DCIS breast cancer diagnosed at 65. She hasn't had genetic testing completed as of yet. Her oldest daughter had genetic testing which came back normal.      GYNECOLOGIC HISTORY:  No LMP recorded. Patient has had a hysterectomy.  Menarche: 82 years old Age at first live birth: 82 years old GP: GxP3 LMP: Had a hysterectomy Contraceptive: OCP HRT: Yes, Estrogen and Progesterone combination for over 30 years.      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 She has 3 children from her first marriage: [name] age 42 who is [name]. [name] is 5 in Sales in [place]. 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 She has a granddaughter that is 8 years old.  She has 2 great-grandchildren and twins on the way (as of December 2020)              ADVANCED DIRECTIVES: Her oldest daughter, [name] is the [name] and can be reached at [phone].    HEALTH MAINTENANCE: Social History   Tobacco Use   Smoking status: Never   Smokeless tobacco: Never  Substance Use Topics   Alcohol use: Yes    Comment: rarely    Colonoscopy:  PAP:  Bone density: at [facility] on 12/10/2015 found a T score of -1.6  Current Facility-Administered Medications on File Prior to Visit  Medication Dose Route Frequency Provider Last Rate Last Admin   0.9 %  sodium chloride infusion   Intravenous Continuous Carol Ada, MD       0.9 %  sodium chloride infusion   Intravenous Continuous Shelly Coss, MD       [MAR Hold] acetaminophen (TYLENOL) tablet 500 mg  500 mg Oral Q6H PRN Kayleen Memos, DO       [MAR Hold] cefTRIAXone (ROCEPHIN) 2 g in sodium chloride 0.9 % 100 mL IVPB  2 g Intravenous Q24H Irene Pap N, DO 200 mL/hr at 03/13/22 0134 2 g at 03/13/22 0134    [MAR Hold] escitalopram (LEXAPRO) tablet 10 mg  10 mg Oral Daily Irene Pap N, DO       ferric gluconate (FERRLECIT) 250 mg in sodium chloride 0.9 % 250 mL IVPB  250 mg Intravenous Daily Adhikari, Amrit, MD       heparin ADULT infusion 100 units/mL (25000 units/238m)  950 Units/hr Intravenous Continuous Poindexter, Leann T, RPH 9.5 mL/hr at 03/13/22 0835 950 Units/hr at 03/13/22 0835   [MAR Hold] levothyroxine (SYNTHROID) tablet 100 mcg  100 mcg Oral Daily HKayleen Memos DO       [MAR Hold] metroNIDAZOLE (FLAGYL) IVPB 500 mg  500 mg Intravenous Q12H HIrene PapN, DO   Stopped at 03/13/22 0210   [MAR Hold] pantoprazole (PROTONIX) injection 40 mg  40 mg Intravenous BID HIrene PapN, DO   40 mg at 03/12/22 2333   potassium chloride 10 mEq in 100 mL IVPB  10 mEq Intravenous Q1 Hr x 4 AShelly Coss MD       [  MAR Hold] prochlorperazine (COMPAZINE) injection 5 mg  5 mg Intravenous Q6H PRN Kayleen Memos, DO       [MAR Hold] traZODone (DESYREL) tablet 25-50 mg  25-50 mg Oral QHS PRN Irene Pap N, DO   50 mg at 03/12/22 2332   Current Outpatient Medications on File Prior to Visit  Medication Sig Dispense Refill   acetaminophen (TYLENOL) 325 MG tablet Take 1-2 tablets (325-650 mg total) by mouth every 4 (four) hours as needed for mild pain. (Patient not taking: Reported on 03/12/2022)     acetaminophen (TYLENOL) 500 MG tablet Take 500 mg by mouth every 6 (six) hours as needed for moderate pain.     amLODipine (NORVASC) 10 MG tablet Take 10 mg by mouth daily.     amoxicillin-clavulanate (AUGMENTIN) 500-125 MG tablet Take 500 mg by mouth every 12 (twelve) hours.     apixaban (ELIQUIS) 5 MG TABS tablet Take 5 mg by mouth 2 (two) times daily.     dapagliflozin propanediol (FARXIGA) 5 MG TABS tablet Take 5 mg by mouth daily.     diphenhydrAMINE (BENADRYL) 25 mg capsule Take 1 capsule (25 mg total) by mouth every 6 (six) hours as needed for itching. 30 capsule 0   escitalopram (LEXAPRO) 10 MG tablet  Take 10 mg daily by mouth.     ferrous sulfate 325 (65 FE) MG tablet Take 325 mg by mouth daily with breakfast.     glimepiride (AMARYL) 2 MG tablet Take 2 mg by mouth 2 (two) times daily.     hydrochlorothiazide (HYDRODIURIL) 25 MG tablet Take 25 mg by mouth daily.     levothyroxine (SYNTHROID) 100 MCG tablet Take 100 mcg by mouth every morning.     metoprolol succinate (TOPROL-XL) 50 MG 24 hr tablet Take 50 mg by mouth daily.     Probiotic Product (ALIGN) 4 MG CAPS Take 1 capsule by mouth daily.     promethazine (PHENERGAN) 12.5 MG tablet Take 12.5 mg by mouth every 6 (six) hours as needed for vomiting or nausea.     traMADol (ULTRAM) 50 MG tablet Take 50 mg by mouth every 8 (eight) hours as needed for moderate pain.     traZODone (DESYREL) 50 MG tablet Take 0.5-1 tablets (25-50 mg total) by mouth at bedtime as needed for sleep. (Patient taking differently: Take 50 mg by mouth at bedtime as needed for sleep.) 10 tablet 0   OBJECTIVE: White woman in no acute distress  There were no vitals filed for this visit.    There were no vitals filed for this visit.  There is no height or weight on file to calculate BMI.   CBC    Component Value Date/Time   WBC 8.6 03/13/2022 0453   RBC 3.24 (L) 03/13/2022 0453   HGB 7.4 (L) 03/13/2022 0453   HGB 12.8 11/13/2021 1317   HGB 13.1 12/02/2016 1213   HCT 24.9 (L) 03/13/2022 0453   HCT 39.4 12/02/2016 1213   PLT 378 03/13/2022 0453   PLT 254 11/13/2021 1317   PLT 250 12/02/2016 1213   MCV 76.9 (L) 03/13/2022 0453   MCV 86.3 12/02/2016 1213   MCH 22.8 (L) 03/13/2022 0453   MCHC 29.7 (L) 03/13/2022 0453   RDW 16.7 (H) 03/13/2022 0453   RDW 15.1 (H) 12/02/2016 1213   LYMPHSABS 2.8 03/12/2022 1701   LYMPHSABS 2.5 12/02/2016 1213   MONOABS 0.7 03/12/2022 1701   MONOABS 0.5 12/02/2016 1213   EOSABS 0.3 03/12/2022 1701  EOSABS 0.2 12/02/2016 1213   BASOSABS 0.1 03/12/2022 1701   BASOSABS 0.1 12/02/2016 1213     STUDIES: CT ABDOMEN  PELVIS W CONTRAST  Result Date: 03/12/2022 CLINICAL DATA:  Dark tarry stools and abdominal pain. EXAM: CT ABDOMEN AND PELVIS WITH CONTRAST TECHNIQUE: Multidetector CT imaging of the abdomen and pelvis was performed using the standard protocol following bolus administration of intravenous contrast. RADIATION DOSE REDUCTION: This exam was performed according to the departmental dose-optimization program which includes automated exposure control, adjustment of the mA and/or kV according to patient size and/or use of iterative reconstruction technique. CONTRAST:  60m OMNIPAQUE IOHEXOL 300 MG/ML  SOLN COMPARISON:  March 19, 2020 FINDINGS: Lower chest: No acute abnormality. Hepatobiliary: There is diffuse fatty infiltration of the liver parenchyma. A 4 mm focus of parenchymal low attenuation is seen within the left lobe of the liver. Status post cholecystectomy. No biliary dilatation. Pancreas: Unremarkable. No pancreatic ductal dilatation or surrounding inflammatory changes. Spleen: Normal in size without focal abnormality. Adrenals/Urinary Tract: Adrenal glands are unremarkable. The left kidney is atrophic in appearance. The right kidney is normal in size. Numerous subcentimeter non-obstructing renal calculi are seen within the left kidney. Several bilateral simple renal cysts are seen. Bladder is unremarkable. Stomach/Bowel: Stomach is within normal limits. Appendix appears normal. No evidence of bowel dilatation. Moderately and thickened mid to distal ascending colon is seen. Noninflamed diverticula are noted throughout the sigmoid colon. Vascular/Lymphatic: Aortic atherosclerosis. No enlarged abdominal or pelvic lymph nodes. Reproductive: Status post hysterectomy. No adnexal masses. Other: No abdominal wall hernia or abnormality. No abdominopelvic ascites. Musculoskeletal: Multilevel degenerative changes seen throughout the lumbar spine, most prominent at the level of L5-S1. IMPRESSION: 1. Moderate severity  colitis involving the mid to distal ascending colon. 2. Sigmoid diverticulosis. 3. Hepatic steatosis. 4. Numerous subcentimeter non-obstructing renal calculi within the left kidney. 5. Atrophic left kidney. 6. Several bilateral simple renal cysts. 7. Aortic atherosclerosis. Aortic Atherosclerosis (ICD10-I70.0). Electronically Signed   By: TVirgina NorfolkM.D.   On: 03/12/2022 19:55   DG Abd 1 View  Result Date: 03/06/2022 CLINICAL DATA:  Abdominal tenderness EXAM: ABDOMEN - 1 VIEW COMPARISON:  Chest CT 01/10/2022 FINDINGS: Cholecystectomy clips. Multiple calcific densities projecting over the left hemiabdomen, most suggestive of nephrolithiasis. Pelvic phleboliths. Paucity of bowel gas. Small amount of stool within the descending colon. Lumbar spine degenerative changes. IMPRESSION: Paucity of bowel gas. Small amount of stool within the descending colon. Probable left nephrolithiasis. Electronically Signed   By: DLovey NewcomerM.D.   On: 03/06/2022 05:54     ELIGIBLE FOR AVAILABLE RESEARCH PROTOCOL: no   ASSESSMENT: 82y.o. [place] woman s/p left breast upper outer quadrant biopsy 11/20/2016 for a clinical T1c N0, stage IA invasive ductal carcinoma, grade 1, estrogen and progesterone receptor positive, HER-2 not amplified, with an Mib-1 of 15%  (1) s/p left lumpectomy with sentinel lymph node sampling 12/11/2016 for a pT1c pN0 invasive ductal carcinoma, grade 2, with negative margins  (2) oncotype score of 24 predicted a 10-year risk of recurrence outside the breast of 16% if the patient's only systemic therapy was tamoxifen for 5 years.  It also predicted no significant benefit from chemotherapy.  (3) adjuvant radiation 01/27/2017 - 02/23/2017  Site/dose:   The patient initially received a dose of 42.5 Gy in 17 fractions to the breast using whole-breast tangent fields. This was delivered using a 3-D conformal technique. The patient then received a boost to the seroma. This delivered an additional  7.5 Gy in  3 fractions using a 3 field photon technique due to the depth of the seroma. The total dose was 50 Gy.   (4) anastrozole started 03/16/2017  (a) bone density at [facility] 12/10/2015 found a T score of -1.6  (b) repeat bone density 12/15/2017 shows a T score of -1.6 (unchanged).  (c) off anastrozole January 2020 through May 2020 (patient did not refill prescription)  PLAN: I called her to follow up on abd pain She didn't answer.  *Total Encounter Time as defined by the Centers for Medicare and Medicaid Services includes, in addition to the face-to-face time of a patient visit (documented in the note above) non-face-to-face time: obtaining and reviewing outside history, ordering and reviewing medications, tests or procedures, care coordination (communications with other health care professionals or caregivers) and documentation in the medical record.

## 2022-03-14 DIAGNOSIS — K529 Noninfective gastroenteritis and colitis, unspecified: Secondary | ICD-10-CM

## 2022-03-14 DIAGNOSIS — K2100 Gastro-esophageal reflux disease with esophagitis, without bleeding: Secondary | ICD-10-CM | POA: Diagnosis not present

## 2022-03-14 DIAGNOSIS — D649 Anemia, unspecified: Secondary | ICD-10-CM

## 2022-03-14 DIAGNOSIS — K921 Melena: Secondary | ICD-10-CM

## 2022-03-14 LAB — GASTROINTESTINAL PANEL BY PCR, STOOL (REPLACES STOOL CULTURE)

## 2022-03-14 LAB — BASIC METABOLIC PANEL
Anion gap: 10 (ref 5–15)
BUN: 6 mg/dL — ABNORMAL LOW (ref 8–23)
CO2: 22 mmol/L (ref 22–32)
Calcium: 8.4 mg/dL — ABNORMAL LOW (ref 8.9–10.3)
Chloride: 108 mmol/L (ref 98–111)
Creatinine, Ser: 1.22 mg/dL — ABNORMAL HIGH (ref 0.44–1.00)
GFR, Estimated: 45 mL/min — ABNORMAL LOW (ref >60)
Glucose, Bld: 117 mg/dL — ABNORMAL HIGH (ref 70–99)
Potassium: 3.3 mmol/L — ABNORMAL LOW (ref 3.5–5.1)
Sodium: 140 mmol/L (ref 135–145)

## 2022-03-14 LAB — CBC
HCT: 27.2 % — ABNORMAL LOW (ref 36.0–46.0)
Hemoglobin: 8.1 g/dL — ABNORMAL LOW (ref 12.0–15.0)
MCH: 22.8 pg — ABNORMAL LOW (ref 26.0–34.0)
MCHC: 29.8 g/dL — ABNORMAL LOW (ref 30.0–36.0)
MCV: 76.6 fL — ABNORMAL LOW (ref 80.0–100.0)
Platelets: 401 10*3/uL — ABNORMAL HIGH (ref 150–400)
RBC: 3.55 MIL/uL — ABNORMAL LOW (ref 3.87–5.11)
RDW: 16.9 % — ABNORMAL HIGH (ref 11.5–15.5)
WBC: 7 10*3/uL (ref 4.0–10.5)
nRBC: 0 % (ref 0.0–0.2)

## 2022-03-14 LAB — MAGNESIUM: Magnesium: 1.7 mg/dL (ref 1.7–2.4)

## 2022-03-14 MED ORDER — PANTOPRAZOLE SODIUM 40 MG PO TBEC
40.0000 mg | DELAYED_RELEASE_TABLET | Freq: Two times a day (BID) | ORAL | Status: DC
  Administered 2022-03-14 – 2022-03-16 (×4): 40 mg via ORAL
  Filled 2022-03-14 (×4): qty 1

## 2022-03-14 MED ORDER — POTASSIUM CHLORIDE 10 MEQ/100ML IV SOLN
10.0000 meq | INTRAVENOUS | Status: AC
  Administered 2022-03-14 (×2): 10 meq via INTRAVENOUS
  Filled 2022-03-14 (×2): qty 100

## 2022-03-14 NOTE — Progress Notes (Signed)
PROGRESS NOTE    Alyssa BIRCHENOUGH  [id number] DOB: Mar 27, 1940 DOA: 03/12/2022 PCP: [name], MD   Brief Narrative:  Patient is a 76 female with history of recent pulmonary embolism with right heart strain/DVT on last December on Eliquis, hypertension, diabetes type 2, history of breast cancer who presented to the emergency department after her gastroenterologist Dr. Collene Mares center for further evaluation of upper GI bleed.  Report of bright red blood per rectum and melanotic stools 2 weeks ago followed by intermittent upper abdominal pain, worse after meals.  Underwent EGD on 216/24.  On presentation ,hemoglobin was 8.5.  FOBT was positive and there was upper abdominal tenderness.  Hemodynamically stable.   Assessment & Plan:   GI bleed:  -Likely in the setting of esophagitis versus colitis.  On anticoagulation -Report of bright red blood per rectum 2 weeks ago followed by melanotic stools, abdominal pain. Presented with hemoglobin in the range of 8.5.  FOBT positive. -Underwent EGD on 03/14/2022 that showed LA grade C esophagitis -GI pathogen panel is pending -Increase Protonix to 40 twice daily today.  Discontinued IV fluids. -Continue to monitor H&H closely.  Continue anticoagulation. -Transfuse as needed   Iron deficiency anemia: Hemoglobin was 9.8 on December 2023.  Presented with hemoglobin of 8.5.  Dropped to the range of 7.  Iron studies consistent of iron deficiency anemia.  S/p 1 unit PRBC transfusion as well as iron infusion  -Continue oral supplements on discharge.   Abdominal pain/colitis: CT abdomen/pelvis showed Moderate severity colitis involving the mid to distal ascending colon.  Patient was complaining of upper abdominal pain worse with meals.  Patient is afebrile.  Empirically started on Rocephin and Flagyl.   -GI pathogen is pending.  May need colonoscopy inpatient versus outpatient -Currently on full liquid diet, gradually advance   Hypokalemia: Supplemented  with potassium.  Magnesium: Pending   CKD stage IIIb:  -Atrophic left kidney noted on CT abdomen/pelvis.   -Currently kidney function at baseline.  Baseline creatinine 1.3.   History of PE: Diagnosed with PE with right heart strain and also DVT on December 23.  On Eliquis at home.  Resumed  Hypothyroidism: Continue levothyroxine  Major depression: Continue Lexapro and trazodone at bedtime  Several bilateral simple renal cysts Nonobstructing renal calculi and left kidney: Noted on CT abdomen/pelvis.   Aortic atherosclerosis: Noted on CT abdomen/pelvis -Outpatient follow-up recommended  Obesity with BMI of 31: -Diet modification, exercise and weight loss would be beneficial for the patient  DVT prophylaxis: Eliquis Code Status: Full code Family Communication: Patient's husband present at bedside.  Plan of care discussed with patient in length and she verbalized understanding and agreed with it. Disposition Plan: To be determined  Consultants:  GI  Procedures:  EGD  Antimicrobials:  Cipro Flagyl  Status is: Inpatient    Subjective: Patient seen and examined.  Husband at the bedside.  Still has dark colored stool and some abdominal discomfort.  No nausea, vomiting.  Overall improving.  No fever, chills overnight.  Objective: Vitals:   03/13/22 1121 03/13/22 1402 03/13/22 2202 03/14/22 0436  BP: 138/73 (!) 123/59 (!) 145/60 (!) 151/65  Pulse: 84 77 86 92  Resp: 18 19    Temp: 98.8 F (37.1 C) 98.3 F (36.8 C) 98.2 F (36.8 C) 98.3 F (36.8 C)  TempSrc: Oral Oral Oral Oral  SpO2: 100% 95% 95% 96%  Weight:      Height:        Intake/Output Summary (Last 24 hours)  at 03/14/2022 [id number] Last data filed at 03/14/2022 0326 Gross per 24 hour  Intake 2638.53 ml  Output 2800 ml  Net -161.47 ml   Filed Weights   03/12/22 2241 03/13/22 0757  Weight: 79.2 kg 79.2 kg    Examination:  General exam: Appears calm and comfortable, on room air, communicating well,  husband at the bedside Respiratory system: Clear to auscultation. Respiratory effort normal. Cardiovascular system: S1 & S2 heard, RRR. No JVD, murmurs, rubs, gallops or clicks. No pedal edema. Gastrointestinal system: Mild terminal tenderness positive, no guarding, no rigidity, no hepatosplenomegaly, bowel sounds positive  Central nervous system: Alert and oriented. No focal neurological deficits. Extremities: Symmetric 5 x 5 power. Skin: No rashes, lesions or ulcers Psychiatry: Judgement and insight appear normal. Mood & affect appropriate.    Data Reviewed: I have personally reviewed following labs and imaging studies  CBC: Recent Labs  Lab 03/12/22 1701 03/12/22 2328 03/13/22 0453 03/13/22 0942 03/14/22 0358  WBC 10.7*  --  8.6  --  7.0  NEUTROABS 6.8  --   --   --   --   HGB 8.5* 7.8* 7.4* 8.2* 8.1*  HCT 29.0* 26.8* 24.9* 28.1* 27.2*  MCV 78.4*  --  76.9*  --  76.6*  PLT 508*  --  378  --  123XX123*   Basic Metabolic Panel: Recent Labs  Lab 03/12/22 1701 03/13/22 0453 03/14/22 0358  NA 139 139 140  K 3.4* 3.2* 3.3*  CL 105 107 108  CO2 23 23 22  $ GLUCOSE 73 89 117*  BUN 12 10 6*  CREATININE 1.32* 1.26* 1.22*  CALCIUM 9.0 8.5* 8.4*  MG  --  1.9  --    GFR: Estimated Creatinine Clearance: 36.4 mL/min (A) (by C-G formula based on SCr of 1.22 mg/dL (H)). Liver Function Tests: Recent Labs  Lab 03/12/22 1701 03/13/22 0453  AST 19 12*  ALT 10 9  ALKPHOS 79 72  BILITOT 0.6 0.4  PROT 7.2 6.0*  ALBUMIN 3.4* 3.3*   Recent Labs  Lab 03/12/22 1701  LIPASE 31   No results for input(s): "AMMONIA" in the last 168 hours. Coagulation Profile: No results for input(s): "INR", "PROTIME" in the last 168 hours. Cardiac Enzymes: No results for input(s): "CKTOTAL", "CKMB", "CKMBINDEX", "TROPONINI" in the last 168 hours. BNP (last 3 results) No results for input(s): "PROBNP" in the last 8760 hours. HbA1C: No results for input(s): "HGBA1C" in the last 72 hours. CBG: Recent  Labs  Lab 03/12/22 2054 03/13/22 0748  GLUCAP 72 109*   Lipid Profile: No results for input(s): "CHOL", "HDL", "LDLCALC", "TRIG", "CHOLHDL", "LDLDIRECT" in the last 72 hours. Thyroid Function Tests: No results for input(s): "TSH", "T4TOTAL", "FREET4", "T3FREE", "THYROIDAB" in the last 72 hours. Anemia Panel: Recent Labs    03/13/22 0453  FERRITIN 11  TIBC 349  IRON 19*   Sepsis Labs: No results for input(s): "PROCALCITON", "LATICACIDVEN" in the last 168 hours.  No results found for this or any previous visit (from the past 240 hour(s)).    Radiology Studies: CT ABDOMEN PELVIS W CONTRAST  Result Date: 03/12/2022 CLINICAL DATA:  Dark tarry stools and abdominal pain. EXAM: CT ABDOMEN AND PELVIS WITH CONTRAST TECHNIQUE: Multidetector CT imaging of the abdomen and pelvis was performed using the standard protocol following bolus administration of intravenous contrast. RADIATION DOSE REDUCTION: This exam was performed according to the departmental dose-optimization program which includes automated exposure control, adjustment of the mA and/or kV according to patient size and/or use  of iterative reconstruction technique. CONTRAST:  65m OMNIPAQUE IOHEXOL 300 MG/ML  SOLN COMPARISON:  March 19, 2020 FINDINGS: Lower chest: No acute abnormality. Hepatobiliary: There is diffuse fatty infiltration of the liver parenchyma. A 4 mm focus of parenchymal low attenuation is seen within the left lobe of the liver. Status post cholecystectomy. No biliary dilatation. Pancreas: Unremarkable. No pancreatic ductal dilatation or surrounding inflammatory changes. Spleen: Normal in size without focal abnormality. Adrenals/Urinary Tract: Adrenal glands are unremarkable. The left kidney is atrophic in appearance. The right kidney is normal in size. Numerous subcentimeter non-obstructing renal calculi are seen within the left kidney. Several bilateral simple renal cysts are seen. Bladder is unremarkable.  Stomach/Bowel: Stomach is within normal limits. Appendix appears normal. No evidence of bowel dilatation. Moderately and thickened mid to distal ascending colon is seen. Noninflamed diverticula are noted throughout the sigmoid colon. Vascular/Lymphatic: Aortic atherosclerosis. No enlarged abdominal or pelvic lymph nodes. Reproductive: Status post hysterectomy. No adnexal masses. Other: No abdominal wall hernia or abnormality. No abdominopelvic ascites. Musculoskeletal: Multilevel degenerative changes seen throughout the lumbar spine, most prominent at the level of L5-S1. IMPRESSION: 1. Moderate severity colitis involving the mid to distal ascending colon. 2. Sigmoid diverticulosis. 3. Hepatic steatosis. 4. Numerous subcentimeter non-obstructing renal calculi within the left kidney. 5. Atrophic left kidney. 6. Several bilateral simple renal cysts. 7. Aortic atherosclerosis. Aortic Atherosclerosis (ICD10-I70.0). Electronically Signed   By: TVirgina NorfolkM.D.   On: 03/12/2022 19:55    Scheduled Meds:  apixaban  5 mg Oral BID   escitalopram  10 mg Oral Daily   levothyroxine  100 mcg Oral Daily   pantoprazole  40 mg Oral Daily   Continuous Infusions:  sodium chloride 75 mL/hr at 03/13/22 2154   cefTRIAXone (ROCEPHIN)  IV 2 g (03/13/22 2133)   ferric gluconate (FERRLECIT) IVPB Stopped (03/13/22 1256)   metronidazole 500 mg (03/13/22 2157)   potassium chloride       LOS: 1 day   Time spent: 35 minutes   Dejia Ebron RLoann Quill MD [facility]  If 7PM-7AM, please contact night-coverage [email or web address] 03/14/2022, 8:12 AM

## 2022-03-14 NOTE — Progress Notes (Signed)
Progress Note   Subjective  Patient has had less frequent BMs - states they remain dark, but seems to be improving. She does have some mid abdominal tenderness. Tolerated clears overnight. Hgb relatively stable since resuming Eliquis overnight. Husband at bedside today.   Objective   Vital signs in last 24 hours: Temp:  [98.2 F (36.8 C)-98.8 F (37.1 C)] 98.3 F (36.8 C) (02/17 0436) Pulse Rate:  [77-92] 92 (02/17 0436) Resp:  [17-19] 19 (02/16 1402) BP: (123-151)/(59-73) 151/65 (02/17 0436) SpO2:  [92 %-100 %] 96 % (02/17 0436) Last BM Date : 03/13/22 General:    white female in NAD Abdomen:  Soft, some mid abdominal TTP, nondistended. Neurologic:  Alert and oriented,  grossly normal neurologically. Psych:  Cooperative. Normal mood and affect.  Intake/Output from previous day: 02/16 0701 - 02/17 0700 In: 2638.5 [P.O.:840; I.V.:1020.2; IV Piggyback:778.4] Out: 2800 [Urine:2800] Intake/Output this shift: No intake/output data recorded.  Lab Results: Recent Labs    03/12/22 1701 03/12/22 2328 03/13/22 0453 03/13/22 0942 03/14/22 0358  WBC 10.7*  --  8.6  --  7.0  HGB 8.5*   < > 7.4* 8.2* 8.1*  HCT 29.0*   < > 24.9* 28.1* 27.2*  PLT 508*  --  378  --  401*   < > = values in this interval not displayed.   BMET Recent Labs    03/12/22 1701 03/13/22 0453 03/14/22 0358  NA 139 139 140  K 3.4* 3.2* 3.3*  CL 105 107 108  CO2 23 23 22  $ GLUCOSE 73 89 117*  BUN 12 10 6*  CREATININE 1.32* 1.26* 1.22*  CALCIUM 9.0 8.5* 8.4*   LFT Recent Labs    03/13/22 0453  PROT 6.0*  ALBUMIN 3.3*  AST 12*  ALT 9  ALKPHOS 72  BILITOT 0.4   PT/INR No results for input(s): "LABPROT", "INR" in the last 72 hours.  Studies/Results: CT ABDOMEN PELVIS W CONTRAST  Result Date: 03/12/2022 CLINICAL DATA:  Dark tarry stools and abdominal pain. EXAM: CT ABDOMEN AND PELVIS WITH CONTRAST TECHNIQUE: Multidetector CT imaging of the abdomen and pelvis was performed using the  standard protocol following bolus administration of intravenous contrast. RADIATION DOSE REDUCTION: This exam was performed according to the departmental dose-optimization program which includes automated exposure control, adjustment of the mA and/or kV according to patient size and/or use of iterative reconstruction technique. CONTRAST:  44m OMNIPAQUE IOHEXOL 300 MG/ML  SOLN COMPARISON:  March 19, 2020 FINDINGS: Lower chest: No acute abnormality. Hepatobiliary: There is diffuse fatty infiltration of the liver parenchyma. A 4 mm focus of parenchymal low attenuation is seen within the left lobe of the liver. Status post cholecystectomy. No biliary dilatation. Pancreas: Unremarkable. No pancreatic ductal dilatation or surrounding inflammatory changes. Spleen: Normal in size without focal abnormality. Adrenals/Urinary Tract: Adrenal glands are unremarkable. The left kidney is atrophic in appearance. The right kidney is normal in size. Numerous subcentimeter non-obstructing renal calculi are seen within the left kidney. Several bilateral simple renal cysts are seen. Bladder is unremarkable. Stomach/Bowel: Stomach is within normal limits. Appendix appears normal. No evidence of bowel dilatation. Moderately and thickened mid to distal ascending colon is seen. Noninflamed diverticula are noted throughout the sigmoid colon. Vascular/Lymphatic: Aortic atherosclerosis. No enlarged abdominal or pelvic lymph nodes. Reproductive: Status post hysterectomy. No adnexal masses. Other: No abdominal wall hernia or abnormality. No abdominopelvic ascites. Musculoskeletal: Multilevel degenerative changes seen throughout the lumbar spine, most prominent at the level of L5-S1. IMPRESSION:  1. Moderate severity colitis involving the mid to distal ascending colon. 2. Sigmoid diverticulosis. 3. Hepatic steatosis. 4. Numerous subcentimeter non-obstructing renal calculi within the left kidney. 5. Atrophic left kidney. 6. Several bilateral  simple renal cysts. 7. Aortic atherosclerosis. Aortic Atherosclerosis (ICD10-I70.0). Electronically Signed   By: Virgina Norfolk M.D.   On: 03/12/2022 19:55       Assessment / Plan:   82 y/o female here with the following:  GI bleed - dark stools Symptomatic Anemia LA grade C esophagitis Abnormal CT scan - colitis Anticoagulated  Generally she seems improved.  EGD with Dr. Benson Norway yesterday showed LA grade C esophagitis.  This could certainly cause dark stools in the setting of anticoagulation however her BUN was never too high and is normal at this time with continued dark stools.  Other potential cause for her anemia and symptoms could be right-sided colitis.  I reviewed CT scans findings with her, she has had recent antibiotic use, GI pathogen panel sent and ruling out for C. Difficile to assess for infectious etiologies.  She has some mild abdominal discomfort, could be related to colitis.  No severe pain or hematochezia, ischemic colitis seems perhaps less likely. New onset Crohn's would be a bit atypical but possible.  Overall she is doing okay, hemoglobin stable since resuming Eliquis.  She will advance diet to full liquids today and see how she tolerates that, continue Protonix, would increase to twice daily dosing in the setting of possible recent bleeding from her esophagus.  Hopefully she improves with more time, on empiric antibiotics for colitis.  If her anemia worsens, continued bleeding despite PPI, may need colonoscopy during this admission.  If not and she is otherwise stable, could try to do this once outpatient.  PLAN: - increase protonix to 49m BID dosing - continue Eliquis - continue empiric antibiotics for colitis - awaiting stool tests - GI pathogen panel and C Diff - full liquid diet today - trend Hgb and response to regimen - consideration for colonoscopy pending her course  We will reassess her tomorrow, please call with questions.  SJolly Mango MD [facility]

## 2022-03-15 ENCOUNTER — Encounter (HOSPITAL_COMMUNITY): Payer: Self-pay | Admitting: Gastroenterology

## 2022-03-15 DIAGNOSIS — D649 Anemia, unspecified: Secondary | ICD-10-CM | POA: Diagnosis not present

## 2022-03-15 DIAGNOSIS — K2100 Gastro-esophageal reflux disease with esophagitis, without bleeding: Secondary | ICD-10-CM | POA: Diagnosis not present

## 2022-03-15 DIAGNOSIS — K529 Noninfective gastroenteritis and colitis, unspecified: Secondary | ICD-10-CM | POA: Diagnosis not present

## 2022-03-15 DIAGNOSIS — D509 Iron deficiency anemia, unspecified: Secondary | ICD-10-CM

## 2022-03-15 DIAGNOSIS — K921 Melena: Secondary | ICD-10-CM | POA: Diagnosis not present

## 2022-03-15 LAB — BASIC METABOLIC PANEL
Anion gap: 11 (ref 5–15)
BUN: <5 mg/dL — ABNORMAL LOW (ref 8–23)
CO2: 22 mmol/L (ref 22–32)
Calcium: 8.6 mg/dL — ABNORMAL LOW (ref 8.9–10.3)
Chloride: 104 mmol/L (ref 98–111)
Creatinine, Ser: 1.28 mg/dL — ABNORMAL HIGH (ref 0.44–1.00)
GFR, Estimated: 43 mL/min — ABNORMAL LOW (ref >60)
Glucose, Bld: 151 mg/dL — ABNORMAL HIGH (ref 70–99)
Potassium: 2.9 mmol/L — ABNORMAL LOW (ref 3.5–5.1)
Sodium: 137 mmol/L (ref 135–145)

## 2022-03-15 LAB — CBC
HCT: 27.3 % — ABNORMAL LOW (ref 36.0–46.0)
Hemoglobin: 8 g/dL — ABNORMAL LOW (ref 12.0–15.0)
MCH: 22.7 pg — ABNORMAL LOW (ref 26.0–34.0)
MCHC: 29.3 g/dL — ABNORMAL LOW (ref 30.0–36.0)
MCV: 77.6 fL — ABNORMAL LOW (ref 80.0–100.0)
Platelets: 420 10*3/uL — ABNORMAL HIGH (ref 150–400)
RBC: 3.52 MIL/uL — ABNORMAL LOW (ref 3.87–5.11)
RDW: 17.4 % — ABNORMAL HIGH (ref 11.5–15.5)
WBC: 9 10*3/uL (ref 4.0–10.5)
nRBC: 0.2 % (ref 0.0–0.2)

## 2022-03-15 LAB — MAGNESIUM: Magnesium: 1.8 mg/dL (ref 1.7–2.4)

## 2022-03-15 MED ORDER — POTASSIUM CHLORIDE 20 MEQ PO PACK
40.0000 meq | PACK | Freq: Two times a day (BID) | ORAL | Status: DC
  Administered 2022-03-15 – 2022-03-16 (×3): 40 meq via ORAL
  Filled 2022-03-15 (×3): qty 2

## 2022-03-15 NOTE — Progress Notes (Signed)
Progress Note   Subjective  Patient states she is feeling somewhat better. Abdominal pain seems better but not yet resolved. She had a dark stool this AM that was quite loose but frequency / severity had decreased. Stool test returned positive Norovirus. C diff pending. Hgb stable.   Objective   Vital signs in last 24 hours: Temp:  [98.5 F (36.9 C)-99.2 F (37.3 C)] 99.2 F (37.3 C) (02/18 0440) Pulse Rate:  [84-97] 97 (02/18 0440) Resp:  [10-16] 16 (02/18 0440) BP: (151-166)/(66-74) 151/74 (02/18 0440) SpO2:  [98 %-100 %] 100 % (02/17 2123) Last BM Date : 03/15/22 General:    white female in NAD Neurologic:  Alert and oriented,  grossly normal neurologically. Psych:  Cooperative. Normal mood and affect.  Intake/Output from previous day: 02/17 0701 - 02/18 0700 In: 900 [P.O.:600; IV Piggyback:300] Out: 1650 [Urine:1650] Intake/Output this shift: Total I/O In: 240 [P.O.:240] Out: 300 [Urine:300]  Lab Results: Recent Labs    03/13/22 0453 03/13/22 0942 03/14/22 0358 03/15/22 0343  WBC 8.6  --  7.0 9.0  HGB 7.4* 8.2* 8.1* 8.0*  HCT 24.9* 28.1* 27.2* 27.3*  PLT 378  --  401* 420*   BMET Recent Labs    03/13/22 0453 03/14/22 0358 03/15/22 0343  NA 139 140 137  K 3.2* 3.3* 2.9*  CL 107 108 104  CO2 23 22 22  $ GLUCOSE 89 117* 151*  BUN 10 6* <5*  CREATININE 1.26* 1.22* 1.28*  CALCIUM 8.5* 8.4* 8.6*   LFT Recent Labs    03/13/22 0453  PROT 6.0*  ALBUMIN 3.3*  AST 12*  ALT 9  ALKPHOS 72  BILITOT 0.4   PT/INR No results for input(s): "LABPROT", "INR" in the last 72 hours.  Studies/Results: No results found.     Assessment / Plan:    82 y/o female here with the following:   GI bleed - dark stools Symptomatic iron deficiency anemia LA grade C esophagitis Abnormal CT scan - colitis Anticoagulated   EGD with Dr. Benson Norway Friday showed LA grade C esophagitis.  This could certainly cause dark stools in the setting of anticoagulation and her  iron deficiency (she endorses chronic untreated reflux), however her BUN was never too high and is normal at this time with continued dark stools.   It is quite possible the right-sided colitis could be the cause of her dark stools otherwise.  We discussed yesterday this may likely be infectious, while ischemic seems a bit less likely as well as new onset IBD.  Her stool test returned positive for Norovirus which is the likely cause of her diarrhea.  C diff was not resulted as part of GI pathogen panel but Dr. Baxter Flattery informed me this AM that C diff is NEGATIVE (it just has not been reported yet). Norovirus a bit atypical to cause this focal colitis on CT scan but possible.  She generally remains improved, hemoglobin stable while on Eliquis.  Will continue that.  Continue Protonix twice daily.  Will advance her diet to soft today, she is hungry and wants to eat.  We discussed whether or not to do a colonoscopy at some point, certainly if she fails to improve/resolve and symptoms persist we will do this inpatient and she is agreeable in that setting.  Otherwise she would prefer to hold off on colonoscopy as inpatient if she otherwise improves, can consider it as outpatient.   PLAN: - continue protonix to 11m BID dosing - continue Eliquis -  advance to soft diet today - trend Hgb and response to regimen - consideration for colonoscopy pending her course as inpatient if she fails to resolve this or improve. I don't think this will be needed during this hospitalization given her course in the past 48 hours, but will reassess her tomorrow.   Dr. Collene Mares / Benson Norway to assume her care tomorrow, please call with questions in the interim.  Jolly Mango, MD [facility]

## 2022-03-15 NOTE — Progress Notes (Signed)
  Transition of Care [facility]) Screening Note   Patient Details  Name: AMILIAN JOVIC Date of Birth: Apr 10, 1940   Transition of Care [facility]) CM/SW Contact:    Vassie Moselle, LCSW Phone Number: 03/15/2022, 11:37 AM    Transition of Care Department [facility]) has reviewed patient and no TOC needs have been identified at this time. We will continue to monitor patient advancement through interdisciplinary progression rounds. If new patient transition needs arise, please place a TOC consult.

## 2022-03-15 NOTE — Progress Notes (Signed)
PROGRESS NOTE    [name]  [id number] DOB: Jul 30, 1940 DOA: 03/12/2022 PCP: [name], MD   Brief Narrative:  Patient is a 34 female with history of recent pulmonary embolism with right heart strain/DVT on last December on Eliquis, hypertension, diabetes type 2, history of breast cancer who presented to the emergency department after her gastroenterologist Dr. Collene Mares center for further evaluation of upper GI bleed.  Report of bright red blood per rectum and melanotic stools 2 weeks ago followed by intermittent upper abdominal pain, worse after meals.  Underwent EGD on 216/24.  On presentation ,hemoglobin was 8.5.  FOBT was positive and there was upper abdominal tenderness.  Hemodynamically stable.   Assessment & Plan:   GI bleed:  -Likely in the setting of esophagitis versus colitis.  On anticoagulation -Report of bright red blood per rectum 2 weeks ago followed by melanotic stools, abdominal pain. Presented with hemoglobin in the range of 8.5.  FOBT positive. -Underwent EGD on 03/14/2022 that showed LA grade C esophagitis -GI pathogen panel is positive for norovirus -Continue Protonix to 40 twice daily today.   -Continue to monitor H&H closely.  Continue Eliquis -Transfuse as needed.  Advance diet to soft diet today   Iron deficiency anemia: Hemoglobin was 9.8 on December 2023.  Presented with hemoglobin of 8.5.  Dropped to the range of 7.  Iron studies consistent of iron deficiency anemia.  S/p 1 unit PRBC transfusion as well as iron infusion  -H&H remained stable   Abdominal pain/colitis: CT abdomen/pelvis showed Moderate severity colitis involving the mid to distal ascending colon.  Patient was complaining of upper abdominal pain worse with meals.  Patient is afebrile.  Empirically started on Rocephin and Flagyl.   -GI pathogen is positive for norovirus.  May need colonoscopy inpatient versus outpatient -Switch diet to soft diet today.  Hypokalemia: Replenished.   Magnesium: WNL.  Repeat BMP tomorrow a.m.  CKD stage IIIb:  -Atrophic left kidney noted on CT abdomen/pelvis.   -Currently kidney function at baseline.  Baseline creatinine 1.3.   History of PE: Diagnosed with PE with right heart strain and also DVT on December 23.  On Eliquis at home.  Resumed  Hypothyroidism: Continue levothyroxine  Major depression: Continue Lexapro and trazodone at bedtime  Several bilateral simple renal cysts Nonobstructing renal calculi and left kidney: Noted on CT abdomen/pelvis.   Aortic atherosclerosis: Noted on CT abdomen/pelvis -Outpatient follow-up recommended  Obesity with BMI of 31: -Diet modification, exercise and weight loss would be beneficial for the patient  DVT prophylaxis: Eliquis Code Status: Full code Family Communication: None present at bedside.  Plan of care discussed with patient in length and she verbalized understanding and agreed with it. Disposition Plan: To be determined  Consultants:  GI  Procedures:  EGD  Antimicrobials:  Rocephin Flagyl  Status is: Inpatient    Subjective: Patient seen and examined.  Sitting comfortably on the bed and eating breakfast.  Reports that she feels better, still has some abdominal discomfort and dark-colored stool.  No nausea, vomiting.  Able to tolerate full liquid diet.  No fever overnight.  No acute events overnight.  Objective: Vitals:   03/14/22 0840 03/14/22 1203 03/14/22 2123 03/15/22 0440  BP: (!) 149/78 (!) 157/66 (!) 166/74 (!) 151/74  Pulse: 96 85 84 97  Resp: 14 15 10 16  $ Temp: 98.3 F (36.8 C) 98.5 F (36.9 C) 98.9 F (37.2 C) 99.2 F (37.3 C)  TempSrc: Oral Oral Oral Oral  SpO2: 98%  98% 100%   Weight:      Height:        Intake/Output Summary (Last 24 hours) at 03/15/2022 1035 Last data filed at 03/15/2022 0857 Gross per 24 hour  Intake 560 ml  Output 1300 ml  Net -740 ml    Filed Weights   03/12/22 2241 03/13/22 0757  Weight: 79.2 kg 79.2 kg     Examination:  General exam: Appears calm and comfortable, on room air, communicating well Respiratory system: Clear to auscultation. Respiratory effort normal. Cardiovascular system: S1 & S2 heard, RRR. No JVD, murmurs, rubs, gallops or clicks. No pedal edema. Gastrointestinal system: Mild  abdominal tenderness positive, no guarding, no rigidity, no hepatosplenomegaly, bowel sounds positive  Central nervous system: Alert and oriented. No focal neurological deficits. Extremities: Symmetric 5 x 5 power. Skin: No rashes, lesions or ulcers Psychiatry: Judgement and insight appear normal. Mood & affect appropriate.    Data Reviewed: I have personally reviewed following labs and imaging studies  CBC: Recent Labs  Lab 03/12/22 1701 03/12/22 2328 03/13/22 0453 03/13/22 0942 03/14/22 0358 03/15/22 0343  WBC 10.7*  --  8.6  --  7.0 9.0  NEUTROABS 6.8  --   --   --   --   --   HGB 8.5* 7.8* 7.4* 8.2* 8.1* 8.0*  HCT 29.0* 26.8* 24.9* 28.1* 27.2* 27.3*  MCV 78.4*  --  76.9*  --  76.6* 77.6*  PLT 508*  --  378  --  401* 420*    Basic Metabolic Panel: Recent Labs  Lab 03/12/22 1701 03/13/22 0453 03/14/22 0358 03/15/22 0343  NA 139 139 140 137  K 3.4* 3.2* 3.3* 2.9*  CL 105 107 108 104  CO2 23 23 22 22  $ GLUCOSE 73 89 117* 151*  BUN 12 10 6* <5*  CREATININE 1.32* 1.26* 1.22* 1.28*  CALCIUM 9.0 8.5* 8.4* 8.6*  MG  --  1.9 1.7 1.8    GFR: Estimated Creatinine Clearance: 34.7 mL/min (A) (by C-G formula based on SCr of 1.28 mg/dL (H)). Liver Function Tests: Recent Labs  Lab 03/12/22 1701 03/13/22 0453  AST 19 12*  ALT 10 9  ALKPHOS 79 72  BILITOT 0.6 0.4  PROT 7.2 6.0*  ALBUMIN 3.4* 3.3*    Recent Labs  Lab 03/12/22 1701  LIPASE 31    No results for input(s): "AMMONIA" in the last 168 hours. Coagulation Profile: No results for input(s): "INR", "PROTIME" in the last 168 hours. Cardiac Enzymes: No results for input(s): "CKTOTAL", "CKMB", "CKMBINDEX",  "TROPONINI" in the last 168 hours. BNP (last 3 results) No results for input(s): "PROBNP" in the last 8760 hours. HbA1C: No results for input(s): "HGBA1C" in the last 72 hours. CBG: Recent Labs  Lab 03/12/22 2054 03/13/22 0748  GLUCAP 72 109*    Lipid Profile: No results for input(s): "CHOL", "HDL", "LDLCALC", "TRIG", "CHOLHDL", "LDLDIRECT" in the last 72 hours. Thyroid Function Tests: No results for input(s): "TSH", "T4TOTAL", "FREET4", "T3FREE", "THYROIDAB" in the last 72 hours. Anemia Panel: Recent Labs    03/13/22 0453  FERRITIN 11  TIBC 349  IRON 19*    Sepsis Labs: No results for input(s): "PROCALCITON", "LATICACIDVEN" in the last 168 hours.  Recent Results (from the past 240 hour(s))  Gastrointestinal Panel by PCR , Stool     Status: Abnormal   Collection Time: 03/13/22 11:55 AM   Specimen: Stool  Result Value Ref Range Status   Campylobacter species NOT DETECTED NOT DETECTED Final   Plesimonas  shigelloides NOT DETECTED NOT DETECTED Final   Salmonella species NOT DETECTED NOT DETECTED Final   Yersinia enterocolitica NOT DETECTED NOT DETECTED Final   Vibrio species NOT DETECTED NOT DETECTED Final   Vibrio cholerae NOT DETECTED NOT DETECTED Final   Enteroaggregative E coli (EAEC) NOT DETECTED NOT DETECTED Final   Enteropathogenic E coli (EPEC) NOT DETECTED NOT DETECTED Final   Enterotoxigenic E coli (ETEC) NOT DETECTED NOT DETECTED Final   Shiga like toxin producing E coli (STEC) NOT DETECTED NOT DETECTED Final   Shigella/Enteroinvasive E coli (EIEC) NOT DETECTED NOT DETECTED Final   Cryptosporidium NOT DETECTED NOT DETECTED Final   Cyclospora cayetanensis NOT DETECTED NOT DETECTED Final   Entamoeba histolytica NOT DETECTED NOT DETECTED Final   Giardia lamblia NOT DETECTED NOT DETECTED Final   Adenovirus F40/41 NOT DETECTED NOT DETECTED Final   Astrovirus NOT DETECTED NOT DETECTED Final   Norovirus GI/GII DETECTED (A) NOT DETECTED Final    Comment: RESULT  CALLED TO, READ BACK BY AND VERIFIED WITH: CHRISTIE HENSON ON 03/14/22 AT 1654 QSD    Rotavirus A NOT DETECTED NOT DETECTED Final   Sapovirus (I, II, IV, and V) NOT DETECTED NOT DETECTED Final    Comment: Performed at [facility], [address], [place], [place] [address]      Radiology Studies: No results found.  Scheduled Meds:  apixaban  5 mg Oral BID   escitalopram  10 mg Oral Daily   levothyroxine  100 mcg Oral Daily   pantoprazole  40 mg Oral BID   potassium chloride  40 mEq Oral BID   Continuous Infusions:  cefTRIAXone (ROCEPHIN)  IV 200 mL/hr at 03/15/22 0221   metronidazole 500 mg (03/15/22 0328)     LOS: 2 days   Time spent: 35 minutes   Saketh Daubert Loann Quill, MD [facility]  If 7PM-7AM, please contact night-coverage [email or web address] 03/15/2022, 10:35 AM

## 2022-03-16 DIAGNOSIS — D649 Anemia, unspecified: Secondary | ICD-10-CM | POA: Diagnosis not present

## 2022-03-16 LAB — BASIC METABOLIC PANEL
Anion gap: 9 (ref 5–15)
BUN: 8 mg/dL (ref 8–23)
CO2: 22 mmol/L (ref 22–32)
Calcium: 8.6 mg/dL — ABNORMAL LOW (ref 8.9–10.3)
Chloride: 105 mmol/L (ref 98–111)
Creatinine, Ser: 1.6 mg/dL — ABNORMAL HIGH (ref 0.44–1.00)
GFR, Estimated: 33 mL/min — ABNORMAL LOW (ref >60)
Glucose, Bld: 143 mg/dL — ABNORMAL HIGH (ref 70–99)
Potassium: 3.7 mmol/L (ref 3.5–5.1)
Sodium: 136 mmol/L (ref 135–145)

## 2022-03-16 LAB — CBC
HCT: 27.8 % — ABNORMAL LOW (ref 36.0–46.0)
Hemoglobin: 8.1 g/dL — ABNORMAL LOW (ref 12.0–15.0)
MCH: 22.8 pg — ABNORMAL LOW (ref 26.0–34.0)
MCHC: 29.1 g/dL — ABNORMAL LOW (ref 30.0–36.0)
MCV: 78.1 fL — ABNORMAL LOW (ref 80.0–100.0)
Platelets: 422 10*3/uL — ABNORMAL HIGH (ref 150–400)
RBC: 3.56 MIL/uL — ABNORMAL LOW (ref 3.87–5.11)
RDW: 17.7 % — ABNORMAL HIGH (ref 11.5–15.5)
WBC: 9.2 10*3/uL (ref 4.0–10.5)
nRBC: 0.3 % — ABNORMAL HIGH (ref 0.0–0.2)

## 2022-03-16 MED ORDER — SODIUM CHLORIDE 0.9 % IV SOLN: INTRAVENOUS | Status: DC

## 2022-03-16 MED ORDER — PANTOPRAZOLE SODIUM 40 MG PO TBEC: 40.0000 mg | DELAYED_RELEASE_TABLET | Freq: Two times a day (BID) | ORAL | 0 refills | Status: DC

## 2022-03-16 MED ORDER — OXYCODONE HCL 5 MG PO TABS: 10.0000 mg | ORAL_TABLET | ORAL | Status: DC | PRN

## 2022-03-16 NOTE — Plan of Care (Signed)
  Problem: Safety: Goal: Ability to remain free from injury will improve Outcome: Progressing   Problem: Pain Managment: Goal: General experience of comfort will improve Outcome: Progressing   Problem: Coping: Goal: Level of anxiety will decrease Outcome: Progressing   Problem: Activity: Goal: Risk for activity intolerance will decrease Outcome: Progressing

## 2022-03-16 NOTE — Discharge Summary (Signed)
Physician Discharge Summary  [name] [id number] DOB: 1940/03/23 DOA: 03/12/2022  PCP: [name], MD  Admit date: 03/12/2022 Discharge date: 03/16/2022  Admitted From: Home Disposition: Home  Recommendations for Outpatient Follow-up:  Follow up with PCP in 1 week Follow-up with Dr. Collene Mares in 1 to 2 weeks Avoid use of NSAIDs.  Hold on hydrochlorothiazide as well as Amaryl at this time due to increase in creatinine  please obtain BMP/CBC in one week Recommend to drink plenty of water  Home Health: None Equipment/Devices: None Discharge Condition: Stable CODE STATUS: Full code Diet recommendation: GI soft diet and advance as tolerated  Brief/Interim Summary: Patient is a 3 female with history of recent pulmonary embolism with right heart strain/DVT on last December on Eliquis, hypertension, diabetes type 2, history of breast cancer who presented to the emergency department after her gastroenterologist Dr. Collene Mares center for further evaluation of upper GI bleed.  Report of bright red blood per rectum and melanotic stools 2 weeks ago followed by intermittent upper abdominal pain, worse after meals.  Underwent EGD on 216/24.  On presentation ,hemoglobin was 8.5.  FOBT was positive and there was upper abdominal tenderness.  Hemodynamically stable.   GI bleed:  -Likely in the setting of esophagitis versus colitis.  On anticoagulation -Report of bright red blood per rectum 2 weeks ago followed by melanotic stools, abdominal pain. Presented with hemoglobin in the range of 8.5.  FOBT positive. -Underwent EGD on 03/14/2022 that showed LA grade C esophagitis -GI pathogen panel is positive for norovirus -Continued Protonix to 40 twice daily  -Recommend to follow-up with Dr. Collene Mares in 1 to 2 weeks.  Avoid NSAIDs.   Iron deficiency anemia: Hemoglobin was 9.8 on December 2023.  Presented with hemoglobin of 8.5.  Dropped to the range of 7.  Iron studies consistent of iron deficiency anemia.   S/p 1 unit PRBC transfusion as well as iron infusion  -H&H remained stable.  Recommend to continue iron supplements  Abdominal pain/colitis:  -Norovirus positive  -CT abdomen/pelvis showed Moderate severity colitis involving the mid to distal ascending colon.  Patient was complaining of upper abdominal pain worse with meals.  Patient is afebrile.  Empirically started on Rocephin and Flagyl.   -GI pathogen is positive for norovirus.  -IV antibiotics discontinued at the time of discharge.  Recommended follow-up with Dr. Collene Mares in 1 to 2 weeks   Hypokalemia: Replenished.   CKD stage IIIb:  -Atrophic left kidney noted on CT abdomen/pelvis.   -Slight increase in creatinine to 1.6 today.  I recommended to drink plenty of water and avoid use of NSAIDs.  Hold on hydrochlorothiazide and Amaryl.  Repeat BMP at follow-up with PCP outpatient in 1 week.   History of PE: Diagnosed with PE with right heart strain and also DVT on December 23.  On Eliquis at home.  Continued same  Hypothyroidism: Continued levothyroxine   Major depression: Continued Lexapro and trazodone at bedtime   Several bilateral simple renal cysts Nonobstructing renal calculi and left kidney: Noted on CT abdomen/pelvis.    Aortic atherosclerosis: Noted on CT abdomen/pelvis -Outpatient follow-up recommended   Obesity with BMI of 31: -Diet modification, exercise and weight loss would be beneficial for the patient  Discharge Diagnoses:  GI bleed Iron deficiency anemia Colitis Norovirus positive Hypokalemia CKD stage IIIb History of PE Hypothyroidism Major depression Several bilateral simple renal cyst Nonobstructive renal calculi left kidney Aortic atherosclerosis Obesity with BMI of 31   Discharge Instructions  Discharge Instructions  Increase activity slowly   Complete by: As directed       Allergies as of 03/16/2022       Reactions   Amitriptyline Other (See Comments)   hallucinations   Aspirin Other  (See Comments)   Bleeding   Nsaids Other (See Comments)   Lower GI bleeding   Meperidine Hcl Other (See Comments)   Causes migraines Other reaction(s): migraine   Morphine And Related Rash   Morphine Sulfate Rash   Other reaction(s): rash        Medication List     STOP taking these medications    amoxicillin-clavulanate 500-125 MG tablet Commonly known as: AUGMENTIN   glimepiride 2 MG tablet Commonly known as: AMARYL   hydrochlorothiazide 25 MG tablet Commonly known as: HYDRODIURIL       TAKE these medications    acetaminophen 500 MG tablet Commonly known as: TYLENOL Take 500 mg by mouth every 6 (six) hours as needed for moderate pain.   acetaminophen 325 MG tablet Commonly known as: TYLENOL Take 1-2 tablets (325-650 mg total) by mouth every 4 (four) hours as needed for mild pain.   Align 4 MG Caps Take 1 capsule by mouth daily.   amLODipine 10 MG tablet Commonly known as: NORVASC Take 10 mg by mouth daily.   diphenhydrAMINE 25 mg capsule Commonly known as: BENADRYL Take 1 capsule (25 mg total) by mouth every 6 (six) hours as needed for itching.   Eliquis 5 MG Tabs tablet Generic drug: apixaban Take 5 mg by mouth 2 (two) times daily.   escitalopram 10 MG tablet Commonly known as: LEXAPRO Take 10 mg daily by mouth.   Farxiga 5 MG Tabs tablet Generic drug: dapagliflozin propanediol Take 5 mg by mouth daily.   ferrous sulfate 325 (65 FE) MG tablet Take 325 mg by mouth daily with breakfast.   levothyroxine 100 MCG tablet Commonly known as: SYNTHROID Take 100 mcg by mouth every morning.   metoprolol succinate 50 MG 24 hr tablet Commonly known as: TOPROL-XL Take 50 mg by mouth daily.   pantoprazole 40 MG tablet Commonly known as: PROTONIX Take 1 tablet (40 mg total) by mouth 2 (two) times daily for 14 days.   promethazine 12.5 MG tablet Commonly known as: PHENERGAN Take 12.5 mg by mouth every 6 (six) hours as needed for vomiting or nausea.    traMADol 50 MG tablet Commonly known as: ULTRAM Take 50 mg by mouth every 8 (eight) hours as needed for moderate pain.   traZODone 50 MG tablet Commonly known as: DESYREL Take 0.5-1 tablets (25-50 mg total) by mouth at bedtime as needed for sleep. What changed: how much to take        Allergies  Allergen Reactions   Amitriptyline Other (See Comments)    hallucinations   Aspirin Other (See Comments)    Bleeding   Nsaids Other (See Comments)    Lower GI bleeding   Meperidine Hcl Other (See Comments)    Causes migraines Other reaction(s): migraine   Morphine And Related Rash   Morphine Sulfate Rash    Other reaction(s): rash    Consultations: GI   Procedures/Studies: CT ABDOMEN PELVIS W CONTRAST  Result Date: 03/12/2022 CLINICAL DATA:  Dark tarry stools and abdominal pain. EXAM: CT ABDOMEN AND PELVIS WITH CONTRAST TECHNIQUE: Multidetector CT imaging of the abdomen and pelvis was performed using the standard protocol following bolus administration of intravenous contrast. RADIATION DOSE REDUCTION: This exam was performed according to the departmental dose-optimization  program which includes automated exposure control, adjustment of the mA and/or kV according to patient size and/or use of iterative reconstruction technique. CONTRAST:  91m OMNIPAQUE IOHEXOL 300 MG/ML  SOLN COMPARISON:  March 19, 2020 FINDINGS: Lower chest: No acute abnormality. Hepatobiliary: There is diffuse fatty infiltration of the liver parenchyma. A 4 mm focus of parenchymal low attenuation is seen within the left lobe of the liver. Status post cholecystectomy. No biliary dilatation. Pancreas: Unremarkable. No pancreatic ductal dilatation or surrounding inflammatory changes. Spleen: Normal in size without focal abnormality. Adrenals/Urinary Tract: Adrenal glands are unremarkable. The left kidney is atrophic in appearance. The right kidney is normal in size. Numerous subcentimeter non-obstructing renal calculi  are seen within the left kidney. Several bilateral simple renal cysts are seen. Bladder is unremarkable. Stomach/Bowel: Stomach is within normal limits. Appendix appears normal. No evidence of bowel dilatation. Moderately and thickened mid to distal ascending colon is seen. Noninflamed diverticula are noted throughout the sigmoid colon. Vascular/Lymphatic: Aortic atherosclerosis. No enlarged abdominal or pelvic lymph nodes. Reproductive: Status post hysterectomy. No adnexal masses. Other: No abdominal wall hernia or abnormality. No abdominopelvic ascites. Musculoskeletal: Multilevel degenerative changes seen throughout the lumbar spine, most prominent at the level of L5-S1. IMPRESSION: 1. Moderate severity colitis involving the mid to distal ascending colon. 2. Sigmoid diverticulosis. 3. Hepatic steatosis. 4. Numerous subcentimeter non-obstructing renal calculi within the left kidney. 5. Atrophic left kidney. 6. Several bilateral simple renal cysts. 7. Aortic atherosclerosis. Aortic Atherosclerosis (ICD10-I70.0). Electronically Signed   By: TVirgina NorfolkM.D.   On: 03/12/2022 19:55   DG Abd 1 View  Result Date: 03/06/2022 CLINICAL DATA:  Abdominal tenderness EXAM: ABDOMEN - 1 VIEW COMPARISON:  Chest CT 01/10/2022 FINDINGS: Cholecystectomy clips. Multiple calcific densities projecting over the left hemiabdomen, most suggestive of nephrolithiasis. Pelvic phleboliths. Paucity of bowel gas. Small amount of stool within the descending colon. Lumbar spine degenerative changes. IMPRESSION: Paucity of bowel gas. Small amount of stool within the descending colon. Probable left nephrolithiasis. Electronically Signed   By: DLovey NewcomerM.D.   On: 03/06/2022 05:54      Subjective: Patient seen and examined.  Spouse at the bedside.  Patient reports overall feels better.  No nausea, vomiting.  Had small amount of bowel movement this morning.  No melena or bright red blood per rectum.  Still has some abdominal  discomfort.  Wishes to go home today.  Discharge Exam: Vitals:   03/15/22 1935 03/16/22 0819  BP: (!) 157/70 (!) 155/75  Pulse: 88 97  Resp:  14  Temp: 98.9 F (37.2 C) 97.8 F (36.6 C)  SpO2: 98% 97%   Vitals:   03/15/22 0440 03/15/22 1359 03/15/22 1935 03/16/22 0819  BP: (!) 151/74 (!) 153/69 (!) 157/70 (!) 155/75  Pulse: 97 90 88 97  Resp: 16 19  14  $ Temp: 99.2 F (37.3 C) 98.5 F (36.9 C) 98.9 F (37.2 C) 97.8 F (36.6 C)  TempSrc: Oral Oral Oral Oral  SpO2:  98% 98% 97%  Weight:      Height:        General: Pt is alert, awake, not in acute distress Cardiovascular: RRR, S1/S2 +, no rubs, no gallops Respiratory: CTA bilaterally, no wheezing, no rhonchi Abdominal: Soft, slightly tender on palpation.  No guarding, no rigidity, no hepatosplenomegaly.  Bowel sounds positive.   Extremities: no edema, no cyanosis    The results of significant diagnostics from this hospitalization (including imaging, microbiology, ancillary and laboratory) are listed below for reference.  Microbiology: Recent Results (from the past 240 hour(s))  Gastrointestinal Panel by PCR , Stool     Status: Abnormal   Collection Time: 03/13/22 11:55 AM   Specimen: Stool  Result Value Ref Range Status   Campylobacter species NOT DETECTED NOT DETECTED Final   Plesimonas shigelloides NOT DETECTED NOT DETECTED Final   Salmonella species NOT DETECTED NOT DETECTED Final   Yersinia enterocolitica NOT DETECTED NOT DETECTED Final   Vibrio species NOT DETECTED NOT DETECTED Final   Vibrio cholerae NOT DETECTED NOT DETECTED Final   Enteroaggregative E coli (EAEC) NOT DETECTED NOT DETECTED Final   Enteropathogenic E coli (EPEC) NOT DETECTED NOT DETECTED Final   Enterotoxigenic E coli (ETEC) NOT DETECTED NOT DETECTED Final   Shiga like toxin producing E coli (STEC) NOT DETECTED NOT DETECTED Final   Shigella/Enteroinvasive E coli (EIEC) NOT DETECTED NOT DETECTED Final   Cryptosporidium NOT DETECTED NOT  DETECTED Final   Cyclospora cayetanensis NOT DETECTED NOT DETECTED Final   Entamoeba histolytica NOT DETECTED NOT DETECTED Final   Giardia lamblia NOT DETECTED NOT DETECTED Final   Adenovirus F40/41 NOT DETECTED NOT DETECTED Final   Astrovirus NOT DETECTED NOT DETECTED Final   Norovirus GI/GII DETECTED (A) NOT DETECTED Final    Comment: RESULT CALLED TO, READ BACK BY AND VERIFIED WITH: CHRISTIE HENSON ON 03/14/22 AT 1654 QSD    Rotavirus A NOT DETECTED NOT DETECTED Final   Sapovirus (I, II, IV, and V) NOT DETECTED NOT DETECTED Final    Comment: Performed at [facility], [address] [place], [place] [address]     Labs: BNP (last 3 results) Recent Labs    01/08/22 1822  BNP [id number]*   Basic Metabolic Panel: Recent Labs  Lab 03/12/22 1701 03/13/22 0453 03/14/22 0358 03/15/22 0343 03/16/22 0351  NA 139 139 140 137 136  K 3.4* 3.2* 3.3* 2.9* 3.7  CL 105 107 108 104 105  CO2 23 23 22 22 22  $ GLUCOSE 73 89 117* 151* 143*  BUN 12 10 6* <5* 8  CREATININE 1.32* 1.26* 1.22* 1.28* 1.60*  CALCIUM 9.0 8.5* 8.4* 8.6* 8.6*  MG  --  1.9 1.7 1.8  --    Liver Function Tests: Recent Labs  Lab 03/12/22 1701 03/13/22 0453  AST 19 12*  ALT 10 9  ALKPHOS 79 72  BILITOT 0.6 0.4  PROT 7.2 6.0*  ALBUMIN 3.4* 3.3*   Recent Labs  Lab 03/12/22 1701  LIPASE 31   No results for input(s): "AMMONIA" in the last 168 hours. CBC: Recent Labs  Lab 03/12/22 1701 03/12/22 2328 03/13/22 0453 03/13/22 0942 03/14/22 0358 03/15/22 0343 03/16/22 0351  WBC 10.7*  --  8.6  --  7.0 9.0 9.2  NEUTROABS 6.8  --   --   --   --   --   --   HGB 8.5*   < > 7.4* 8.2* 8.1* 8.0* 8.1*  HCT 29.0*   < > 24.9* 28.1* 27.2* 27.3* 27.8*  MCV 78.4*  --  76.9*  --  76.6* 77.6* 78.1*  PLT 508*  --  378  --  401* 420* 422*   < > = values in this interval not displayed.   Cardiac Enzymes: No results for input(s): "CKTOTAL", "CKMB", "CKMBINDEX", "TROPONINI" in the last 168 hours. BNP: Invalid  input(s): "POCBNP" CBG: Recent Labs  Lab 03/12/22 2054 03/13/22 0748  GLUCAP 72 109*   D-Dimer No results for input(s): "DDIMER" in the last 72 hours. Hgb A1c  No results for input(s): "HGBA1C" in the last 72 hours. Lipid Profile No results for input(s): "CHOL", "HDL", "LDLCALC", "TRIG", "CHOLHDL", "LDLDIRECT" in the last 72 hours. Thyroid function studies No results for input(s): "TSH", "T4TOTAL", "T3FREE", "THYROIDAB" in the last 72 hours.  Invalid input(s): "FREET3" Anemia work up No results for input(s): "VITAMINB12", "FOLATE", "FERRITIN", "TIBC", "IRON", "RETICCTPCT" in the last 72 hours. Urinalysis    Component Value Date/Time   COLORURINE STRAW (A) 03/12/2022 1537   APPEARANCEUR CLEAR 03/12/2022 1537   LABSPEC 1.018 03/12/2022 1537   PHURINE 6.0 03/12/2022 1537   GLUCOSEU >=500 (A) 03/12/2022 1537   HGBUR NEGATIVE 03/12/2022 1537   BILIRUBINUR NEGATIVE 03/12/2022 1537   BILIRUBINUR neg 12/13/2013 1122   KETONESUR NEGATIVE 03/12/2022 1537   PROTEINUR NEGATIVE 03/12/2022 1537   UROBILINOGEN negative 12/13/2013 1122   UROBILINOGEN 0.2 12/08/2010 1156   NITRITE NEGATIVE 03/12/2022 1537   LEUKOCYTESUR NEGATIVE 03/12/2022 1537   Sepsis Labs Recent Labs  Lab 03/13/22 0453 03/14/22 0358 03/15/22 0343 03/16/22 0351  WBC 8.6 7.0 9.0 9.2   Microbiology Recent Results (from the past 240 hour(s))  Gastrointestinal Panel by PCR , Stool     Status: Abnormal   Collection Time: 03/13/22 11:55 AM   Specimen: Stool  Result Value Ref Range Status   Campylobacter species NOT DETECTED NOT DETECTED Final   Plesimonas shigelloides NOT DETECTED NOT DETECTED Final   Salmonella species NOT DETECTED NOT DETECTED Final   Yersinia enterocolitica NOT DETECTED NOT DETECTED Final   Vibrio species NOT DETECTED NOT DETECTED Final   Vibrio cholerae NOT DETECTED NOT DETECTED Final   Enteroaggregative E coli (EAEC) NOT DETECTED NOT DETECTED Final   Enteropathogenic E coli (EPEC) NOT  DETECTED NOT DETECTED Final   Enterotoxigenic E coli (ETEC) NOT DETECTED NOT DETECTED Final   Shiga like toxin producing E coli (STEC) NOT DETECTED NOT DETECTED Final   Shigella/Enteroinvasive E coli (EIEC) NOT DETECTED NOT DETECTED Final   Cryptosporidium NOT DETECTED NOT DETECTED Final   Cyclospora cayetanensis NOT DETECTED NOT DETECTED Final   Entamoeba histolytica NOT DETECTED NOT DETECTED Final   Giardia lamblia NOT DETECTED NOT DETECTED Final   Adenovirus F40/41 NOT DETECTED NOT DETECTED Final   Astrovirus NOT DETECTED NOT DETECTED Final   Norovirus GI/GII DETECTED (A) NOT DETECTED Final    Comment: RESULT CALLED TO, READ BACK BY AND VERIFIED WITH: CHRISTIE HENSON ON 03/14/22 AT 1654 QSD    Rotavirus A NOT DETECTED NOT DETECTED Final   Sapovirus (I, II, IV, and V) NOT DETECTED NOT DETECTED Final    Comment: Performed at [facility], [address] [place], [place] [address]     Time coordinating discharge: Over 30 minutes  SIGNED:   Mckinley Jewel, MD  [facility] 03/16/2022, 12:51 PM Pager   If 7PM-7AM, please contact night-coverage [email or web address]

## 2022-03-16 NOTE — Progress Notes (Signed)
Subjective: Progress through initial hospitalization reviewed. Patient seems to be doing well today.  Her diarrhea has resolved she denies having any abdominal pain nausea vomiting. She is eager to go home. C. difficile toxin assay is negative.  Her GI panel revealed norovirus.  Viital signs in last 24 hours: Temp:  [98.5 F (36.9 C)-98.9 F (37.2 C)] 98.9 F (37.2 C) (02/18 1935) Pulse Rate:  [88-90] 88 (02/18 1935) Resp:  [19] 19 (02/18 1359) BP: (153-157)/(69-70) 157/70 (02/18 1935) SpO2:  [98 %] 98 % (02/18 1935) Last BM Date : 03/15/22  Intake/Output from previous day: 02/18 0701 - 02/19 0700 In: 820 [P.O.:820] Out: 1275 [Urine:1275] Intake/Output this shift: No intake/output data recorded.  General appearance: alert, cooperative, appears stated age, no distress, and moderately obese Resp: clear to auscultation bilaterally Cardio: regular rate and rhythm, S1, S2 normal, no murmur, click, rub or gallop GI: soft, non-tender; bowel sounds normal; no masses,  no organomegaly  Lab Results: Recent Labs    03/14/22 0358 03/15/22 0343 03/16/22 0351  WBC 7.0 9.0 9.2  HGB 8.1* 8.0* 8.1*  HCT 27.2* 27.3* 27.8*  PLT 401* 420* 422*   BMET Recent Labs    03/14/22 0358 03/15/22 0343 03/16/22 0351  NA 140 137 136  K 3.3* 2.9* 3.7  CL 108 104 105  CO2 22 22 22  $ GLUCOSE 117* 151* 143*  BUN 6* <5* 8  CREATININE 1.22* 1.28* 1.60*  CALCIUM 8.4* 8.6* 8.6*   Medications: I have reviewed the patient's current medications. Prior to Admission:  No medications prior to admission.   Scheduled:  apixaban  5 mg Oral BID   escitalopram  10 mg Oral Daily   levothyroxine  100 mcg Oral Daily   pantoprazole  40 mg Oral BID   potassium chloride  40 mEq Oral BID   Continuous:  sodium chloride 100 mL/hr at 03/16/22 [id number]    Assessment/Plan: 1) Acid reflux with esophagitis initially presenting with melenic stools and iron deficiency anemia; hemoglobin dropped from 8.5 g/dL to 8.1  g/dL today since admission. Patient is to refrain from the use of all nonsteroidals. I will see her in the office in the next couple of weeks. She is to resume her iron supplements for now. 2) Diarrhea secondary to Norovirus-resolved. 3) history of DVT with pulmonary embolism and right heart strain started on Eliquis in December, 2023-patient has resumed Eliquis at home. 4) Chronic kidney disease Stage IIIb. 5) Hypothyroidism/obesity 6) Major depression. 6)   LOS: 3 days   Juanita Craver 03/16/2022, 7:07 AM

## 2022-03-19 DIAGNOSIS — M25521 Pain in right elbow: Secondary | ICD-10-CM | POA: Diagnosis not present

## 2022-03-24 DIAGNOSIS — E1122 Type 2 diabetes mellitus with diabetic chronic kidney disease: Secondary | ICD-10-CM | POA: Diagnosis not present

## 2022-03-24 DIAGNOSIS — Z86711 Personal history of pulmonary embolism: Secondary | ICD-10-CM | POA: Diagnosis not present

## 2022-03-24 DIAGNOSIS — I129 Hypertensive chronic kidney disease with stage 1 through stage 4 chronic kidney disease, or unspecified chronic kidney disease: Secondary | ICD-10-CM | POA: Diagnosis not present

## 2022-03-24 DIAGNOSIS — E1169 Type 2 diabetes mellitus with other specified complication: Secondary | ICD-10-CM | POA: Diagnosis not present

## 2022-03-24 DIAGNOSIS — K2101 Gastro-esophageal reflux disease with esophagitis, with bleeding: Secondary | ICD-10-CM | POA: Diagnosis not present

## 2022-03-24 DIAGNOSIS — Z86010 Personal history of colonic polyps: Secondary | ICD-10-CM | POA: Diagnosis not present

## 2022-03-24 DIAGNOSIS — N1832 Chronic kidney disease, stage 3b: Secondary | ICD-10-CM | POA: Diagnosis not present

## 2022-03-24 DIAGNOSIS — Z86718 Personal history of other venous thrombosis and embolism: Secondary | ICD-10-CM | POA: Diagnosis not present

## 2022-03-24 DIAGNOSIS — D509 Iron deficiency anemia, unspecified: Secondary | ICD-10-CM | POA: Diagnosis not present

## 2022-03-24 DIAGNOSIS — Z8719 Personal history of other diseases of the digestive system: Secondary | ICD-10-CM | POA: Diagnosis not present

## 2022-03-24 DIAGNOSIS — K5730 Diverticulosis of large intestine without perforation or abscess without bleeding: Secondary | ICD-10-CM | POA: Diagnosis not present

## 2022-03-25 DIAGNOSIS — E1122 Type 2 diabetes mellitus with diabetic chronic kidney disease: Secondary | ICD-10-CM | POA: Diagnosis not present

## 2022-03-25 DIAGNOSIS — N2581 Secondary hyperparathyroidism of renal origin: Secondary | ICD-10-CM | POA: Diagnosis not present

## 2022-03-25 DIAGNOSIS — N1831 Chronic kidney disease, stage 3a: Secondary | ICD-10-CM | POA: Diagnosis not present

## 2022-03-25 DIAGNOSIS — D631 Anemia in chronic kidney disease: Secondary | ICD-10-CM | POA: Diagnosis not present

## 2022-03-25 DIAGNOSIS — E876 Hypokalemia: Secondary | ICD-10-CM | POA: Diagnosis not present

## 2022-03-25 DIAGNOSIS — I82409 Acute embolism and thrombosis of unspecified deep veins of unspecified lower extremity: Secondary | ICD-10-CM | POA: Diagnosis not present

## 2022-03-26 DIAGNOSIS — M79672 Pain in left foot: Secondary | ICD-10-CM | POA: Diagnosis not present

## 2022-04-02 DIAGNOSIS — S5001XA Contusion of right elbow, initial encounter: Secondary | ICD-10-CM | POA: Diagnosis not present

## 2022-04-02 DIAGNOSIS — S60221A Contusion of right hand, initial encounter: Secondary | ICD-10-CM | POA: Diagnosis not present

## 2022-04-02 DIAGNOSIS — S9032XA Contusion of left foot, initial encounter: Secondary | ICD-10-CM | POA: Diagnosis not present

## 2022-04-07 ENCOUNTER — Ambulatory Visit: Payer: PPO | Admitting: Internal Medicine

## 2022-04-07 ENCOUNTER — Other Ambulatory Visit: Payer: PPO

## 2022-04-09 DIAGNOSIS — N6320 Unspecified lump in the left breast, unspecified quadrant: Secondary | ICD-10-CM | POA: Diagnosis not present

## 2022-04-09 DIAGNOSIS — E119 Type 2 diabetes mellitus without complications: Secondary | ICD-10-CM | POA: Diagnosis not present

## 2022-04-09 DIAGNOSIS — I129 Hypertensive chronic kidney disease with stage 1 through stage 4 chronic kidney disease, or unspecified chronic kidney disease: Secondary | ICD-10-CM | POA: Diagnosis not present

## 2022-04-09 DIAGNOSIS — R911 Solitary pulmonary nodule: Secondary | ICD-10-CM | POA: Diagnosis not present

## 2022-04-10 ENCOUNTER — Other Ambulatory Visit: Payer: Self-pay | Admitting: Family Medicine

## 2022-04-10 DIAGNOSIS — N6320 Unspecified lump in the left breast, unspecified quadrant: Secondary | ICD-10-CM

## 2022-04-10 DIAGNOSIS — R911 Solitary pulmonary nodule: Secondary | ICD-10-CM

## 2022-04-20 DIAGNOSIS — H16223 Keratoconjunctivitis sicca, not specified as Sjogren's, bilateral: Secondary | ICD-10-CM | POA: Diagnosis not present

## 2022-04-22 ENCOUNTER — Ambulatory Visit
Admission: RE | Admit: 2022-04-22 | Discharge: 2022-04-22 | Disposition: A | Discharge status: Home / Self Care | Payer: PPO | Source: Ambulatory Visit | Attending: Family Medicine | Admitting: Family Medicine

## 2022-04-22 ENCOUNTER — Other Ambulatory Visit: Payer: Self-pay | Admitting: Family Medicine

## 2022-04-22 DIAGNOSIS — N6320 Unspecified lump in the left breast, unspecified quadrant: Secondary | ICD-10-CM

## 2022-04-22 DIAGNOSIS — Z853 Personal history of malignant neoplasm of breast: Secondary | ICD-10-CM | POA: Diagnosis not present

## 2022-04-23 DIAGNOSIS — K219 Gastro-esophageal reflux disease without esophagitis: Secondary | ICD-10-CM | POA: Diagnosis not present

## 2022-04-23 DIAGNOSIS — I129 Hypertensive chronic kidney disease with stage 1 through stage 4 chronic kidney disease, or unspecified chronic kidney disease: Secondary | ICD-10-CM | POA: Diagnosis not present

## 2022-04-23 DIAGNOSIS — E785 Hyperlipidemia, unspecified: Secondary | ICD-10-CM | POA: Diagnosis not present

## 2022-04-23 DIAGNOSIS — E1169 Type 2 diabetes mellitus with other specified complication: Secondary | ICD-10-CM | POA: Diagnosis not present

## 2022-04-23 DIAGNOSIS — N1832 Chronic kidney disease, stage 3b: Secondary | ICD-10-CM | POA: Diagnosis not present

## 2022-04-23 DIAGNOSIS — E039 Hypothyroidism, unspecified: Secondary | ICD-10-CM | POA: Diagnosis not present

## 2022-04-28 ENCOUNTER — Other Ambulatory Visit: Payer: PPO

## 2022-04-30 ENCOUNTER — Ambulatory Visit
Admission: RE | Admit: 2022-04-30 | Discharge: 2022-04-30 | Disposition: A | Discharge status: Home / Self Care | Payer: PPO | Source: Ambulatory Visit | Attending: Family Medicine | Admitting: Family Medicine

## 2022-04-30 ENCOUNTER — Other Ambulatory Visit: Payer: Self-pay | Admitting: Family Medicine

## 2022-04-30 DIAGNOSIS — N6012 Diffuse cystic mastopathy of left breast: Secondary | ICD-10-CM | POA: Diagnosis not present

## 2022-04-30 DIAGNOSIS — N6320 Unspecified lump in the left breast, unspecified quadrant: Secondary | ICD-10-CM

## 2022-05-01 DIAGNOSIS — M1712 Unilateral primary osteoarthritis, left knee: Secondary | ICD-10-CM | POA: Diagnosis not present

## 2022-05-01 DIAGNOSIS — M25562 Pain in left knee: Secondary | ICD-10-CM | POA: Diagnosis not present

## 2022-05-01 DIAGNOSIS — M25561 Pain in right knee: Secondary | ICD-10-CM | POA: Diagnosis not present

## 2022-05-04 ENCOUNTER — Ambulatory Visit: Payer: PPO

## 2022-05-04 ENCOUNTER — Ambulatory Visit: Payer: PPO | Admitting: Internal Medicine

## 2022-05-04 DIAGNOSIS — I50811 Acute right heart failure: Secondary | ICD-10-CM | POA: Diagnosis not present

## 2022-05-04 DIAGNOSIS — M25562 Pain in left knee: Secondary | ICD-10-CM | POA: Diagnosis not present

## 2022-05-04 DIAGNOSIS — M25561 Pain in right knee: Secondary | ICD-10-CM | POA: Diagnosis not present

## 2022-05-04 DIAGNOSIS — I10 Essential (primary) hypertension: Secondary | ICD-10-CM

## 2022-05-04 DIAGNOSIS — Z96651 Presence of right artificial knee joint: Secondary | ICD-10-CM | POA: Diagnosis not present

## 2022-05-04 NOTE — Progress Notes (Signed)
No show

## 2022-05-05 DIAGNOSIS — L570 Actinic keratosis: Secondary | ICD-10-CM | POA: Diagnosis not present

## 2022-05-05 DIAGNOSIS — L821 Other seborrheic keratosis: Secondary | ICD-10-CM | POA: Diagnosis not present

## 2022-05-05 DIAGNOSIS — D239 Other benign neoplasm of skin, unspecified: Secondary | ICD-10-CM | POA: Diagnosis not present

## 2022-05-05 NOTE — Progress Notes (Signed)
Called patient no answer, could not leave a vm due to vm being full.

## 2022-05-05 NOTE — Progress Notes (Signed)
normal

## 2022-05-06 NOTE — Progress Notes (Signed)
Gave patient results, she acknowledged understanding and had no further questions.

## 2022-05-08 ENCOUNTER — Other Ambulatory Visit: Payer: PPO

## 2022-05-12 ENCOUNTER — Ambulatory Visit
Admission: RE | Admit: 2022-05-12 | Discharge: 2022-05-12 | Disposition: A | Discharge status: Home / Self Care | Payer: PPO | Source: Ambulatory Visit | Attending: Family Medicine | Admitting: Family Medicine

## 2022-05-12 DIAGNOSIS — R911 Solitary pulmonary nodule: Secondary | ICD-10-CM | POA: Diagnosis not present

## 2022-05-12 DIAGNOSIS — I700 Atherosclerosis of aorta: Secondary | ICD-10-CM | POA: Diagnosis not present

## 2022-05-20 ENCOUNTER — Encounter: Payer: Self-pay | Admitting: Internal Medicine

## 2022-05-20 ENCOUNTER — Ambulatory Visit: Payer: PPO | Admitting: Internal Medicine

## 2022-05-20 VITALS — BP 135/65 | HR 70 | Resp 16 | Ht 62.0 in | Wt 170.6 lb

## 2022-05-20 DIAGNOSIS — I10 Essential (primary) hypertension: Secondary | ICD-10-CM | POA: Diagnosis not present

## 2022-05-20 DIAGNOSIS — I2609 Other pulmonary embolism with acute cor pulmonale: Secondary | ICD-10-CM

## 2022-05-20 DIAGNOSIS — I50811 Acute right heart failure: Secondary | ICD-10-CM

## 2022-05-20 NOTE — Progress Notes (Signed)
Primary Physician/Referring:  Laurann Montana, MD  [name], female    DOB: 01-Jun-1940, 82 y.o.   MRN: [id number]  Chief Complaint  Patient presents with  . Hypertension  . Follow-up  . Results   HPI:    [name]  is a 82 y.o. female with past medical history significant for hypertension, history of left-sided breast cancer, and newly found PE with right heart strain who is here to establish care with cardiology.  Patient underwent radiation therapy and she had a lumpectomy in the past.  Approximately a month ago she was graduated from her cancer doctor and told she did not need to come back for further follow-ups.  Shortly thereafter patient presented to the emergency department with acute onset shortness of breath.  She was noted to have pulmonary embolism causing right-sided heart failure.  She was placed on Eliquis and told that she will have to continue this for at least 3 months.  Patient now has to go back to her cancer doctor for follow-up and she is going next week.  She denies chest pain, diaphoresis, syncope, claudication, orthopnea, edema, PND.  Past Medical History:  Diagnosis Date  . Anxiety   . Arthritis    knees  . Cancer 11/2016   Left breast cancer  . Depression   . Diabetes mellitus without complication   . Diverticulitis   . Diverticulosis    bleeding  . GERD (gastroesophageal reflux disease)   . Hypertension   . Hyperthyroidism    nodule on thyroid, Radioactive, now hypo  . Hypothyroidism, iatrogenic    After RI now hypo on synthroid  . Personal history of radiation therapy    Past Surgical History:  Procedure Laterality Date  . ABDOMINAL HYSTERECTOMY  1977  . BLADDER SURGERY     bladder suspension  . BREAST EXCISIONAL BIOPSY Right   . BREAST LUMPECTOMY Left 2018  . BREAST LUMPECTOMY WITH RADIOACTIVE SEED AND SENTINEL LYMPH NODE BIOPSY Left 12/11/2016   Procedure: BREAST LUMPECTOMY WITH RADIOACTIVE SEED AND SENTINEL LYMPH NODE  BIOPSY;  Surgeon: Emelia Loron, MD;  Location: [facility];  Service: General;  Laterality: Left;  . CHOLECYSTECTOMY N/A 03/29/2013   Procedure: LAPAROSCOPIC CHOLECYSTECTOMY WITH INTRAOPERATIVE CHOLANGIOGRAM;  Surgeon: Mariella Saa, MD;  [facility];  Service: General;  Laterality: N/A;  . ESOPHAGOGASTRODUODENOSCOPY (EGD) WITH PROPOFOL N/A 03/13/2022   Procedure: ESOPHAGOGASTRODUODENOSCOPY (EGD) WITH PROPOFOL;  Surgeon: Jeani Hawking, MD;  Location: [facility];  Service: Gastroenterology;  Laterality: N/A;  . JOINT REPLACEMENT Right 2009   total knee replacement  . ROTATOR CUFF REPAIR Left   . TOTAL KNEE REVISION  12/17/2010   Procedure: TOTAL KNEE REVISION;  Surgeon: Gus Rankin Aluisio;  Location: [facility];  Service: Orthopedics;  Laterality: Right;   Family History  Problem Relation Age of Onset  . Diverticulitis Mother   . Alzheimer's disease Mother   . Colon cancer Maternal Grandmother   . Heart attack Father   . Breast cancer Daughter     Social History   Tobacco Use  . Smoking status: Never  . Smokeless tobacco: Never  Substance Use Topics  . Alcohol use: Yes    Comment: rarely   Marital Status: Married  ROS  Review of Systems  Cardiovascular:  Positive for dyspnea on exertion.  Respiratory:  Positive for shortness of breath.    Objective  Blood pressure 135/65, pulse 70, resp. rate 16, height  (1.575 m), weight 170 lb  9.6 oz (77.4 kg), SpO2 96 %. Body mass index is 31.2 kg/m.     05/20/2022    1:23 PM 03/16/2022    1:10 PM 03/16/2022    8:19 AM  Vitals with BMI  Height 5\' 2"     Weight 170 lbs 10 oz    BMI 31.2    Systolic 135 142 403  Diastolic 65 67 75  Pulse 70 86 97     Physical Exam Vitals reviewed.  HENT:     Head: Normocephalic and atraumatic.  Cardiovascular:     Rate and Rhythm: Normal rate and regular rhythm.     Pulses: Normal pulses.     Heart sounds: Murmur heard.  Pulmonary:     Effort: Pulmonary effort is  normal.  Abdominal:     General: Bowel sounds are normal.  Musculoskeletal:     Right lower leg: No edema.     Left lower leg: No edema.  Skin:    General: Skin is warm and dry.  Neurological:     Mental Status: She is alert.    Medications and allergies   Allergies  Allergen Reactions  . Amitriptyline Other (See Comments)    hallucinations  . Aspirin Other (See Comments)    Bleeding  . Nsaids Other (See Comments)    Lower GI bleeding  . Meperidine Hcl Other (See Comments)    Causes migraines Other reaction(s): migraine  . Morphine And Related Rash  . Morphine Sulfate Rash    Other reaction(s): rash     Medication list after today's encounter   Current Outpatient Medications:  .  acetaminophen (TYLENOL) 325 MG tablet, Take 1-2 tablets (325-650 mg total) by mouth every 4 (four) hours as needed for mild pain., Disp: , Rfl:  .  acetaminophen (TYLENOL) 500 MG tablet, Take 500 mg by mouth every 6 (six) hours as needed for moderate pain., Disp: , Rfl:  .  amLODipine (NORVASC) 10 MG tablet, Take 10 mg by mouth daily., Disp: , Rfl:  .  apixaban (ELIQUIS) 5 MG TABS tablet, Take 5 mg by mouth 2 (two) times daily., Disp: , Rfl:  .  benzonatate (TESSALON) 200 MG capsule, Take 200 mg by mouth 3 (three) times daily as needed for cough., Disp: , Rfl:  .  dapagliflozin propanediol (FARXIGA) 5 MG TABS tablet, Take 5 mg by mouth daily., Disp: , Rfl:  .  diphenhydrAMINE (BENADRYL) 25 mg capsule, Take 1 capsule (25 mg total) by mouth every 6 (six) hours as needed for itching., Disp: 30 capsule, Rfl: 0 .  escitalopram (LEXAPRO) 10 MG tablet, Take 10 mg daily by mouth., Disp: , Rfl:  .  glimepiride (AMARYL) 2 MG tablet, Take 2 mg by mouth daily with breakfast., Disp: , Rfl:  .  hydrochlorothiazide (HYDRODIURIL) 25 MG tablet, Take 25 mg by mouth daily., Disp: , Rfl:  .  levothyroxine (SYNTHROID) 100 MCG tablet, Take 100 mcg by mouth every morning., Disp: , Rfl:  .  metoprolol succinate  (TOPROL-XL) 50 MG 24 hr tablet, Take 50 mg by mouth daily., Disp: , Rfl:  .  pregabalin (LYRICA) 25 MG capsule, Take 25 mg by mouth 2 (two) times daily., Disp: , Rfl:  .  Probiotic Product (ALIGN) 4 MG CAPS, Take 1 capsule by mouth daily., Disp: , Rfl:  .  promethazine (PHENERGAN) 12.5 MG tablet, Take 12.5 mg by mouth every 6 (six) hours as needed for vomiting or nausea., Disp: , Rfl:  .  rosuvastatin (CRESTOR) 5 MG  tablet, Take 5 mg by mouth daily., Disp: , Rfl:  .  traMADol (ULTRAM) 50 MG tablet, Take 50 mg by mouth every 8 (eight) hours as needed for moderate pain., Disp: , Rfl:  .  traZODone (DESYREL) 50 MG tablet, Take 0.5-1 tablets (25-50 mg total) by mouth at bedtime as needed for sleep. (Patient taking differently: Take 50 mg by mouth at bedtime as needed for sleep.), Disp: 10 tablet, Rfl: 0 .  pantoprazole (PROTONIX) 40 MG tablet, Take 1 tablet (40 mg total) by mouth 2 (two) times daily for 14 days., Disp: 28 tablet, Rfl: 0  Laboratory examination:   Lab Results  Component Value Date   NA 136 03/16/2022   K 3.7 03/16/2022   CO2 22 03/16/2022   GLUCOSE 143 (H) 03/16/2022   BUN 8 03/16/2022   CREATININE 1.60 (H) 03/16/2022   CALCIUM 8.6 (L) 03/16/2022   EGFR 48 (L) 12/02/2016   GFRNONAA 33 (L) 03/16/2022       Latest Ref Rng & Units 03/16/2022    3:51 AM 03/15/2022    3:43 AM 03/14/2022    3:58 AM  CMP  Glucose 70 - 99 mg/dL 161  096  045   BUN 8 - 23 mg/dL 8  <5  6   Creatinine 4.09 - 1.00 mg/dL 8.11  9.14  7.82   Sodium 135 - 145 mmol/L 136  137  140   Potassium 3.5 - 5.1 mmol/L 3.7  2.9  3.3   Chloride 98 - 111 mmol/L 105  104  108   CO2 22 - 32 mmol/L 22  22  22    Calcium 8.9 - 10.3 mg/dL 8.6  8.6  8.4       Latest Ref Rng & Units 03/16/2022    3:51 AM 03/15/2022    3:43 AM 03/14/2022    3:58 AM  CBC  WBC 4.0 - 10.5 K/uL 9.2  9.0  7.0   Hemoglobin 12.0 - 15.0 g/dL 8.1  8.0  8.1   Hematocrit 36.0 - 46.0 % 27.8  27.3  27.2   Platelets 150 - 400 K/uL 422  420  401      Lipid Panel No results for input(s): "CHOL", "TRIG", "LDLCALC", "VLDL", "HDL", "CHOLHDL", "LDLDIRECT" in the last 8760 hours.  HEMOGLOBIN A1C Lab Results  Component Value Date   HGBA1C 9.0 (H) 01/09/2022   MPG 212 01/09/2022   TSH Recent Labs    01/08/22 2044  TSH 3.549    External labs:     Radiology:    Cardiac Studies:   ECHO COMPLETE WITH IMAGING ENHANCING AGENT 01/09/2022  1. Right ventricular systolic function is moderately reduced. The right ventricular size is mildly enlarged. There is moderately elevated pulmonary artery systolic pressure. The estimated right ventricular systolic pressure is 46.0 mmHg. There is free wall hypokinesis with normal motion at apex, consistent with McConnell's sign, which can be seen in PE. Recommend ruling out PE 2. Left ventricular ejection fraction, by estimation, is 70 to 75%. The left ventricle has hyperdynamic function. The left ventricle has no regional wall motion abnormalities. There is mild left ventricular hypertrophy. Left ventricular diastolic parameters are consistent with Grade I diastolic dysfunction (impaired relaxation). There is the interventricular septum is flattened in systole and diastole, consistent with right ventricular pressure and volume overload. 3. The mitral valve is normal in structure. No evidence of mitral valve regurgitation. 4. Tricuspid valve regurgitation is mild to moderate. 5. The aortic valve is tricuspid. Aortic valve regurgitation  is not visualized. No aortic stenosis is present. 6. The inferior vena cava is normal in size with greater than 50% respiratory variability, suggesting right atrial pressure of 3 mmHg.   Echocardiogram 05/04/2022:  Normal LV systolic function with visual EF 60-65%. Left ventricle cavity  is normal in size. Moderate concentric hypertrophy of the left ventricle.  Normal global wall motion. Doppler evidence of grade I (impaired)  diastolic dysfunction, normal LAP.  Calculated EF 71%.  Left atrial cavity is mildly dilated at 4.2 cm.  Trileaflet aortic valve with no regurgitation. Mild aortic valve leaflet  calcification.  Trace mitral regurgitation. Mild calcification of the mitral valve  annulus.  Structurally normal tricuspid valve.  Mild tricuspid regurgitation. No  evidence of pulmonary hypertension.  Structurally normal pulmonic valve.  Mild pulmonic regurgitation.  No prior available for comparison.    LE Venous duplex 01/10/2022 Summary:  RIGHT:  - Findings consistent with acute deep vein thrombosis involving the one of the right posterior tibial veins.  LEFT:  - Findings consistent with acute deep vein thrombosis involving the left peroneal veins, and one of the left posterior tibial veins.  - A cystic structure is found in the popliteal fossa (5.7 x 1.1 x 2.3 cm).    EKG:   02/06/2022: Sinus Rhythm with low voltage in precordial leads. -Anterior infarct -age undetermined.  Assessment     ICD-10-CM   1. Essential hypertension  I10     2. Acute right-sided congestive heart failure  I50.811     3. Acute pulmonary embolism with acute cor pulmonale, unspecified pulmonary embolism type  I26.09        No orders of the defined types were placed in this encounter.   No orders of the defined types were placed in this encounter.   Medications Discontinued During This Encounter  Medication Reason  . ferrous sulfate 325 (65 FE) MG tablet Completed Course  . valsartan (DIOVAN) 160 MG tablet Entry Error     Recommendations:   [name] is a 82 y.o.  female with PE and cor pulmonale    Acute right-sided congestive heart failure (HCC) Will repeat echocardiogram prior to next visit Patient completed radiation and had lobectomy, she was graduated from her cancer doctor about a month ago and then she developed this PE with right-sided HF. Currently, patient is not experiencing symptoms   Essential hypertension Continue  current cardiac medications. Encourage low-sodium diet, less than 2000 mg daily. Follow-up in 2 months or sooner if needed.   Acute pulmonary embolism with acute cor pulmonale, unspecified pulmonary embolism type (HCC) She is to be on [facility] for a total of 3 months She has follow-up to re-establish with her cancer doctor given new PE with cor pulmonale She still has small clots below the knee, likely the larger ones have traveled to cause the PE She denies leg pain, swelling, or redness     Clotilde Dieter, DO, [facility]  05/20/2022, 1:38 PM Office: [phone] Pager: [phone]

## 2022-05-25 DIAGNOSIS — E1169 Type 2 diabetes mellitus with other specified complication: Secondary | ICD-10-CM | POA: Diagnosis not present

## 2022-05-25 DIAGNOSIS — E785 Hyperlipidemia, unspecified: Secondary | ICD-10-CM | POA: Diagnosis not present

## 2022-05-25 DIAGNOSIS — K219 Gastro-esophageal reflux disease without esophagitis: Secondary | ICD-10-CM | POA: Diagnosis not present

## 2022-05-25 DIAGNOSIS — Z471 Aftercare following joint replacement surgery: Secondary | ICD-10-CM | POA: Diagnosis not present

## 2022-05-25 DIAGNOSIS — E039 Hypothyroidism, unspecified: Secondary | ICD-10-CM | POA: Diagnosis not present

## 2022-05-25 DIAGNOSIS — Z96651 Presence of right artificial knee joint: Secondary | ICD-10-CM | POA: Diagnosis not present

## 2022-05-25 DIAGNOSIS — I129 Hypertensive chronic kidney disease with stage 1 through stage 4 chronic kidney disease, or unspecified chronic kidney disease: Secondary | ICD-10-CM | POA: Diagnosis not present

## 2022-05-25 DIAGNOSIS — N183 Chronic kidney disease, stage 3 unspecified: Secondary | ICD-10-CM | POA: Diagnosis not present

## 2022-05-25 DIAGNOSIS — M25562 Pain in left knee: Secondary | ICD-10-CM | POA: Diagnosis not present

## 2022-06-08 DIAGNOSIS — I700 Atherosclerosis of aorta: Secondary | ICD-10-CM | POA: Diagnosis not present

## 2022-06-08 DIAGNOSIS — N1832 Chronic kidney disease, stage 3b: Secondary | ICD-10-CM | POA: Diagnosis not present

## 2022-06-08 DIAGNOSIS — F331 Major depressive disorder, recurrent, moderate: Secondary | ICD-10-CM | POA: Diagnosis not present

## 2022-06-08 DIAGNOSIS — I129 Hypertensive chronic kidney disease with stage 1 through stage 4 chronic kidney disease, or unspecified chronic kidney disease: Secondary | ICD-10-CM | POA: Diagnosis not present

## 2022-06-08 DIAGNOSIS — K219 Gastro-esophageal reflux disease without esophagitis: Secondary | ICD-10-CM | POA: Diagnosis not present

## 2022-06-08 DIAGNOSIS — E1122 Type 2 diabetes mellitus with diabetic chronic kidney disease: Secondary | ICD-10-CM | POA: Diagnosis not present

## 2022-06-08 DIAGNOSIS — E1169 Type 2 diabetes mellitus with other specified complication: Secondary | ICD-10-CM | POA: Diagnosis not present

## 2022-06-08 DIAGNOSIS — E039 Hypothyroidism, unspecified: Secondary | ICD-10-CM | POA: Diagnosis not present

## 2022-06-08 DIAGNOSIS — E782 Mixed hyperlipidemia: Secondary | ICD-10-CM | POA: Diagnosis not present

## 2022-06-08 DIAGNOSIS — Z86711 Personal history of pulmonary embolism: Secondary | ICD-10-CM | POA: Diagnosis not present

## 2022-06-08 DIAGNOSIS — Z853 Personal history of malignant neoplasm of breast: Secondary | ICD-10-CM | POA: Diagnosis not present

## 2022-06-08 DIAGNOSIS — Z9181 History of falling: Secondary | ICD-10-CM | POA: Diagnosis not present

## 2022-06-08 DIAGNOSIS — Z0000 Encounter for general adult medical examination without abnormal findings: Secondary | ICD-10-CM | POA: Diagnosis not present

## 2022-06-18 DIAGNOSIS — M1712 Unilateral primary osteoarthritis, left knee: Secondary | ICD-10-CM | POA: Diagnosis not present

## 2022-06-24 DIAGNOSIS — F331 Major depressive disorder, recurrent, moderate: Secondary | ICD-10-CM | POA: Diagnosis not present

## 2022-06-24 DIAGNOSIS — N1832 Chronic kidney disease, stage 3b: Secondary | ICD-10-CM | POA: Diagnosis not present

## 2022-06-24 DIAGNOSIS — E039 Hypothyroidism, unspecified: Secondary | ICD-10-CM | POA: Diagnosis not present

## 2022-06-24 DIAGNOSIS — K219 Gastro-esophageal reflux disease without esophagitis: Secondary | ICD-10-CM | POA: Diagnosis not present

## 2022-06-24 DIAGNOSIS — E1169 Type 2 diabetes mellitus with other specified complication: Secondary | ICD-10-CM | POA: Diagnosis not present

## 2022-06-24 DIAGNOSIS — I129 Hypertensive chronic kidney disease with stage 1 through stage 4 chronic kidney disease, or unspecified chronic kidney disease: Secondary | ICD-10-CM | POA: Diagnosis not present

## 2022-06-24 DIAGNOSIS — E785 Hyperlipidemia, unspecified: Secondary | ICD-10-CM | POA: Diagnosis not present

## 2022-06-25 DIAGNOSIS — M1712 Unilateral primary osteoarthritis, left knee: Secondary | ICD-10-CM | POA: Diagnosis not present

## 2022-07-02 DIAGNOSIS — M1712 Unilateral primary osteoarthritis, left knee: Secondary | ICD-10-CM | POA: Diagnosis not present

## 2022-07-06 ENCOUNTER — Ambulatory Visit: Payer: PPO | Admitting: Hematology and Oncology

## 2022-07-06 ENCOUNTER — Encounter: Payer: PPO | Admitting: Rheumatology

## 2022-07-07 ENCOUNTER — Inpatient Hospital Stay: Payer: PPO | Attending: Hematology and Oncology | Admitting: Hematology and Oncology

## 2022-07-07 ENCOUNTER — Encounter: Payer: Self-pay | Admitting: Hematology and Oncology

## 2022-07-07 VITALS — BP 148/61 | HR 75 | Temp 97.7°F | Resp 18 | Ht 62.0 in | Wt 163.8 lb

## 2022-07-07 DIAGNOSIS — Z853 Personal history of malignant neoplasm of breast: Secondary | ICD-10-CM | POA: Diagnosis not present

## 2022-07-07 DIAGNOSIS — Z170 Estrogen receptor positive status [ER+]: Secondary | ICD-10-CM

## 2022-07-07 DIAGNOSIS — C50412 Malignant neoplasm of upper-outer quadrant of left female breast: Secondary | ICD-10-CM | POA: Diagnosis not present

## 2022-07-07 DIAGNOSIS — Z08 Encounter for follow-up examination after completed treatment for malignant neoplasm: Secondary | ICD-10-CM | POA: Insufficient documentation

## 2022-07-07 NOTE — Progress Notes (Signed)
[name]  Telephone:(336) [phone] Fax:(336) [phone]   NB: Patient did not show for he 03/15/2017 appt  ID: [name] DOB: September 06, 1940  MR#: [id number]  GEX#:[id number]  Patient Care Team: Alyssa Montana, MD as PCP - General (Family Medicine) Hernandez, Alyssa Hue, MD (Inactive) as Consulting Physician (Oncology) Alyssa Severin, MD as Consulting Physician (Orthopedic Surgery) Alyssa Elizabeth, MD as Consulting Physician (Gastroenterology) Alyssa Else, MD as Consulting Physician (Dermatology) Alyssa Gully, MD (Inactive) as Consulting Physician (Urology) Alyssa Puffer, MD as Consulting Physician (Radiation Oncology) Alyssa Loron, MD as Consulting Physician (General Surgery)   CHIEF COMPLAINT: estrogen receptor positive breast cancer  CURRENT TREATMENT: Observation  INTERVAL HISTORY:  Since last visit she doesn't have any more abdominal pain. She made some changes to the diet, avoided lactose products and this has helped. She denies breast changes today. No change in breathing, bowel habits and urinary habits. No falls.   COVID 19 VACCINATION STATUS: Had Moderna vaccine x2 then had Covid October 2021, did receive antibiotic infusion; had COVID again subsequently and again received an infusion   HISTORY OF CURRENT ILLNESS:  From the original intake note:  [name] initially palpated a lump to her left breast while watching TV which she brought to medical attention and was evaluated 1 week following by her PCP, Dr. Constance Hernandez. She then had an unilateral diagnostic left mammography with tomography and CAD and left breast ultrasonography at [facility] on 11/17/2016 showing a 1.6 cm mass in the upper left breast suspicous for malignancy. The left axilla was benign.  Accordingly on 11/20/2016, she proceeded to biopsy of the left breast area in question. The pathology from this procedure showed ([id number]): invasive mammary carcinoma. Prognostic indicators: ER: 100%  positive; PR: 5% positive; Both with strong staining intensity. Proliferation marker Ki67: 15% ; HER-2: negative.  The patient's subsequent history is as detailed below.   PAST MEDICAL HISTORY: Past Medical History:  Diagnosis Date   Anxiety    Arthritis    knees   Cancer (HCC) 11/2016   Left breast cancer   Depression    Diabetes mellitus without complication (HCC)    Diverticulitis    Diverticulosis    bleeding   GERD (gastroesophageal reflux disease)    Hypertension    Hyperthyroidism    nodule on thyroid, Radioactive, now hypo   Hypothyroidism, iatrogenic    After RI now hypo on synthroid   Personal history of radiation therapy     PAST SURGICAL HISTORY: Past Surgical History:  Procedure Laterality Date   ABDOMINAL HYSTERECTOMY  1977   BLADDER SURGERY     bladder suspension   BREAST EXCISIONAL BIOPSY Right    BREAST LUMPECTOMY Left 2018   BREAST LUMPECTOMY WITH RADIOACTIVE SEED AND SENTINEL LYMPH NODE BIOPSY Left 12/11/2016   Procedure: BREAST LUMPECTOMY WITH RADIOACTIVE SEED AND SENTINEL LYMPH NODE BIOPSY;  Surgeon: Alyssa Loron, MD;  Location: [facility];  Service: General;  Laterality: Left;   CHOLECYSTECTOMY N/A 03/29/2013   Procedure: LAPAROSCOPIC CHOLECYSTECTOMY WITH INTRAOPERATIVE CHOLANGIOGRAM;  Surgeon: Alyssa Saa, MD;  Location: [facility];  Service: General;  Laterality: N/A;   ESOPHAGOGASTRODUODENOSCOPY (EGD) WITH PROPOFOL N/A 03/13/2022   Procedure: ESOPHAGOGASTRODUODENOSCOPY (EGD) WITH PROPOFOL;  Surgeon: Alyssa Hawking, MD;  [facility];  Service: Gastroenterology;  Laterality: N/A;   JOINT REPLACEMENT Right 2009   total knee replacement   ROTATOR CUFF REPAIR Left    TOTAL KNEE REVISION  12/17/2010   Procedure: TOTAL KNEE REVISION;  Surgeon: Homero Fellers  V Aluisio;  Location: [facility];  Service: Orthopedics;  Laterality: Right;    FAMILY HISTORY: Family History  Problem Relation Age of Onset   Diverticulitis Mother     Alzheimer's disease Mother    Colon cancer Maternal Grandmother    Heart attack Father    Breast cancer Daughter   Her father died at age 12 from heart failure. Her mother died at age 60 from Alzheimer's. She has 1 sister and no brothers. Her maternal grandmother had colon cancer and she is unsure of what age she was diagnosed with colon cancer. Her father was diagnosed with skin cancer in his late 71's, and she notes that he didn't have melanoma. Her daughter had DCIS breast cancer diagnosed at 25. She hasn't had genetic testing completed as of yet. Her oldest daughter had genetic testing which came back normal.      GYNECOLOGIC HISTORY:  No LMP recorded. Patient has had a hysterectomy.  Menarche: 82 years old Age at first live birth: 82 years old GP: GxP3 LMP: Had a hysterectomy Contraceptive: OCP HRT: Yes, Estrogen and Progesterone combination for over 30 years.      SOCIAL HISTORY:  She is retired from being a Oncologist at [employer]. Her (second) husband [name] is a receiving clerk at [employer]. She has 3 children from her first marriage: [name] age 29 who is Interior and spatial designer of a Adult nurse in [place]. [name] is 68 in Sales in [place]. 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 She has a granddaughter that is 6 years old.  She has 2 great-grandchildren and twins on the way (as of December 2020)              ADVANCED DIRECTIVES: Her oldest daughter, [name] is the [employer] of [place] and can be reached at [phone].    HEALTH MAINTENANCE: Social History   Tobacco Use   Smoking status: Never   Smokeless tobacco: Never  Substance Use Topics   Alcohol use: Yes    Comment: rarely    Colonoscopy:  PAP:  Bone density: at [facility] on 12/10/2015 found a T score of -1.6  Current Outpatient Medications on File Prior to Visit  Medication Sig Dispense Refill   acetaminophen (TYLENOL) 325 MG tablet Take 1-2 tablets (325-650 mg total)  by mouth every 4 (four) hours as needed for mild pain.     acetaminophen (TYLENOL) 500 MG tablet Take 500 mg by mouth every 6 (six) hours as needed for moderate pain.     amLODipine (NORVASC) 10 MG tablet Take 10 mg by mouth daily.     apixaban (ELIQUIS) 5 MG TABS tablet Take 5 mg by mouth 2 (two) times daily.     benzonatate (TESSALON) 200 MG capsule Take 200 mg by mouth 3 (three) times daily as needed for cough.     dapagliflozin propanediol (FARXIGA) 5 MG TABS tablet Take 5 mg by mouth daily.     diphenhydrAMINE (BENADRYL) 25 mg capsule Take 1 capsule (25 mg total) by mouth every 6 (six) hours as needed for itching. 30 capsule 0   escitalopram (LEXAPRO) 10 MG tablet Take 10 mg daily by mouth.     glimepiride (AMARYL) 2 MG tablet Take 2 mg by mouth daily with breakfast.     hydrochlorothiazide (HYDRODIURIL) 25 MG tablet Take 25 mg by mouth daily.     levothyroxine (SYNTHROID) 100 MCG tablet Take 100 mcg by mouth every morning.  metoprolol succinate (TOPROL-XL) 50 MG 24 hr tablet Take 50 mg by mouth daily.     pantoprazole (PROTONIX) 40 MG tablet Take 1 tablet (40 mg total) by mouth 2 (two) times daily for 14 days. 28 tablet 0   pregabalin (LYRICA) 25 MG capsule Take 25 mg by mouth 2 (two) times daily.     Probiotic Product (ALIGN) 4 MG CAPS Take 1 capsule by mouth daily.     promethazine (PHENERGAN) 12.5 MG tablet Take 12.5 mg by mouth every 6 (six) hours as needed for vomiting or nausea.     rosuvastatin (CRESTOR) 5 MG tablet Take 5 mg by mouth daily.     traMADol (ULTRAM) 50 MG tablet Take 50 mg by mouth every 8 (eight) hours as needed for moderate pain.     traZODone (DESYREL) 50 MG tablet Take 0.5-1 tablets (25-50 mg total) by mouth at bedtime as needed for sleep. (Patient taking differently: Take 50 mg by mouth at bedtime as needed for sleep.) 10 tablet 0   No current facility-administered medications on file prior to visit.   OBJECTIVE: White woman in no acute distress  Vitals:    07/07/22 1257  BP: (!) 148/61  Pulse: 75  Resp: 18  Temp: 97.7 F (36.5 C)  SpO2: 98%      Filed Weights   07/07/22 1257  Weight: 163 lb 12.8 oz (74.3 kg)    Body mass index is 29.96 kg/m. Physical Exam Constitutional:      Appearance: Normal appearance.  Cardiovascular:     Pulses: Normal pulses.     Heart sounds: Normal heart sounds.  Chest:       Comments: Left breast with some post op changes.  Musculoskeletal:        General: Swelling (ankle swelling) present.     Cervical back: Normal range of motion and neck supple.  Lymphadenopathy:     Cervical: No cervical adenopathy.  Neurological:     Mental Status: She is alert.      CBC    Component Value Date/Time   WBC 9.2 03/16/2022 0351   RBC 3.56 (L) 03/16/2022 0351   HGB 8.1 (L) 03/16/2022 0351   HGB 12.8 11/13/2021 1317   HGB 13.1 12/02/2016 1213   HCT 27.8 (L) 03/16/2022 0351   HCT 39.4 12/02/2016 1213   PLT 422 (H) 03/16/2022 0351   PLT 254 11/13/2021 1317   PLT 250 12/02/2016 1213   MCV 78.1 (L) 03/16/2022 0351   MCV 86.3 12/02/2016 1213   MCH 22.8 (L) 03/16/2022 0351   MCHC 29.1 (L) 03/16/2022 0351   RDW 17.7 (H) 03/16/2022 0351   RDW 15.1 (H) 12/02/2016 1213   LYMPHSABS 2.8 03/12/2022 1701   LYMPHSABS 2.5 12/02/2016 1213   MONOABS 0.7 03/12/2022 1701   MONOABS 0.5 12/02/2016 1213   EOSABS 0.3 03/12/2022 1701   EOSABS 0.2 12/02/2016 1213   BASOSABS 0.1 03/12/2022 1701   BASOSABS 0.1 12/02/2016 1213     STUDIES: No results found.   ELIGIBLE FOR AVAILABLE RESEARCH PROTOCOL: no   ASSESSMENT: 82 y.o. [place] woman s/p left breast upper outer quadrant biopsy 11/20/2016 for a clinical T1c N0, stage IA invasive ductal carcinoma, grade 1, estrogen and progesterone receptor positive, HER-2 not amplified, with an Mib-1 of 15%  (1) s/p left lumpectomy with sentinel lymph node sampling 12/11/2016 for a pT1c pN0 invasive ductal carcinoma, grade 2, with negative margins  (2) oncotype score of  24 predicted a 10-year risk of recurrence outside  the breast of 16% if the patient's only systemic therapy was tamoxifen for 5 years.  It also predicted no significant benefit from chemotherapy.  (3) adjuvant radiation 01/27/2017 - 02/23/2017  Site/dose:   The patient initially received a dose of 42.5 Gy in 17 fractions to the breast using whole-breast tangent fields. This was delivered using a 3-D conformal technique. The patient then received a boost to the seroma. This delivered an additional 7.5 Gy in 3 fractions using a 3 field photon technique due to the depth of the seroma. The total dose was 50 Gy.   (4) anastrozole started 03/16/2017  (a) bone density at [facility] 12/10/2015 found a T score of -1.6  (b) repeat bone density 12/15/2017 shows a T score of -1.6 (unchanged).  (c) off anastrozole January 2020 through May 2020 (patient did not refill prescription)  PLAN:  She has completed 5 yrs of anastrozole. She will now continue with annual screening mammogram, due in August, ordered. No concerns on exam Encouraged regular walking RTC in 1 yr or sooner as needed.   *Total Encounter Time as defined by the Centers for Medicare and Medicaid Services includes, in addition to the face-to-face time of a patient visit (documented in the note above) non-face-to-face time: obtaining and reviewing outside history, ordering and reviewing medications, tests or procedures, care coordination (communications with other health care professionals or caregivers) and documentation in the medical record.

## 2022-07-22 DIAGNOSIS — K219 Gastro-esophageal reflux disease without esophagitis: Secondary | ICD-10-CM | POA: Diagnosis not present

## 2022-07-22 DIAGNOSIS — E1169 Type 2 diabetes mellitus with other specified complication: Secondary | ICD-10-CM | POA: Diagnosis not present

## 2022-07-22 DIAGNOSIS — E039 Hypothyroidism, unspecified: Secondary | ICD-10-CM | POA: Diagnosis not present

## 2022-07-22 DIAGNOSIS — N1832 Chronic kidney disease, stage 3b: Secondary | ICD-10-CM | POA: Diagnosis not present

## 2022-07-22 DIAGNOSIS — E785 Hyperlipidemia, unspecified: Secondary | ICD-10-CM | POA: Diagnosis not present

## 2022-07-22 DIAGNOSIS — I129 Hypertensive chronic kidney disease with stage 1 through stage 4 chronic kidney disease, or unspecified chronic kidney disease: Secondary | ICD-10-CM | POA: Diagnosis not present

## 2022-08-03 ENCOUNTER — Ambulatory Visit: Payer: PPO | Admitting: Rheumatology

## 2022-08-11 DIAGNOSIS — E119 Type 2 diabetes mellitus without complications: Secondary | ICD-10-CM | POA: Diagnosis not present

## 2022-08-11 DIAGNOSIS — H2513 Age-related nuclear cataract, bilateral: Secondary | ICD-10-CM | POA: Diagnosis not present

## 2022-09-10 ENCOUNTER — Ambulatory Visit
Admission: RE | Admit: 2022-09-10 | Discharge: 2022-09-10 | Disposition: A | Discharge status: Home / Self Care | Payer: PPO | Source: Ambulatory Visit | Attending: Hematology and Oncology | Admitting: Hematology and Oncology

## 2022-09-10 DIAGNOSIS — Z1231 Encounter for screening mammogram for malignant neoplasm of breast: Secondary | ICD-10-CM | POA: Diagnosis not present

## 2022-09-10 DIAGNOSIS — Z170 Estrogen receptor positive status [ER+]: Secondary | ICD-10-CM

## 2022-10-01 DIAGNOSIS — R1084 Generalized abdominal pain: Secondary | ICD-10-CM | POA: Diagnosis not present

## 2022-10-01 DIAGNOSIS — C772 Secondary and unspecified malignant neoplasm of intra-abdominal lymph nodes: Secondary | ICD-10-CM | POA: Diagnosis not present

## 2022-10-01 DIAGNOSIS — N281 Cyst of kidney, acquired: Secondary | ICD-10-CM | POA: Diagnosis not present

## 2022-10-01 DIAGNOSIS — N200 Calculus of kidney: Secondary | ICD-10-CM | POA: Diagnosis not present

## 2022-10-01 DIAGNOSIS — K219 Gastro-esophageal reflux disease without esophagitis: Secondary | ICD-10-CM | POA: Diagnosis not present

## 2022-10-01 DIAGNOSIS — Z8616 Personal history of COVID-19: Secondary | ICD-10-CM | POA: Diagnosis not present

## 2022-10-01 DIAGNOSIS — I10 Essential (primary) hypertension: Secondary | ICD-10-CM | POA: Diagnosis not present

## 2022-10-01 DIAGNOSIS — Z853 Personal history of malignant neoplasm of breast: Secondary | ICD-10-CM | POA: Diagnosis not present

## 2022-10-01 DIAGNOSIS — E7800 Pure hypercholesterolemia, unspecified: Secondary | ICD-10-CM | POA: Diagnosis not present

## 2022-10-01 DIAGNOSIS — K5730 Diverticulosis of large intestine without perforation or abscess without bleeding: Secondary | ICD-10-CM | POA: Diagnosis not present

## 2022-10-01 DIAGNOSIS — I272 Pulmonary hypertension, unspecified: Secondary | ICD-10-CM | POA: Diagnosis not present

## 2022-10-01 DIAGNOSIS — Z9889 Other specified postprocedural states: Secondary | ICD-10-CM | POA: Diagnosis not present

## 2022-10-01 DIAGNOSIS — E1169 Type 2 diabetes mellitus with other specified complication: Secondary | ICD-10-CM | POA: Diagnosis not present

## 2022-10-01 DIAGNOSIS — Z86711 Personal history of pulmonary embolism: Secondary | ICD-10-CM | POA: Diagnosis not present

## 2022-10-01 DIAGNOSIS — I129 Hypertensive chronic kidney disease with stage 1 through stage 4 chronic kidney disease, or unspecified chronic kidney disease: Secondary | ICD-10-CM | POA: Diagnosis not present

## 2022-10-01 DIAGNOSIS — E890 Postprocedural hypothyroidism: Secondary | ICD-10-CM | POA: Diagnosis not present

## 2022-10-01 DIAGNOSIS — Z79890 Hormone replacement therapy (postmenopausal): Secondary | ICD-10-CM | POA: Diagnosis not present

## 2022-10-01 DIAGNOSIS — C180 Malignant neoplasm of cecum: Secondary | ICD-10-CM | POA: Diagnosis not present

## 2022-10-01 DIAGNOSIS — E876 Hypokalemia: Secondary | ICD-10-CM | POA: Diagnosis not present

## 2022-10-01 DIAGNOSIS — Z8639 Personal history of other endocrine, nutritional and metabolic disease: Secondary | ICD-10-CM | POA: Diagnosis not present

## 2022-10-01 DIAGNOSIS — Z86718 Personal history of other venous thrombosis and embolism: Secondary | ICD-10-CM | POA: Diagnosis not present

## 2022-10-01 DIAGNOSIS — E1122 Type 2 diabetes mellitus with diabetic chronic kidney disease: Secondary | ICD-10-CM | POA: Diagnosis not present

## 2022-10-01 DIAGNOSIS — R1031 Right lower quadrant pain: Secondary | ICD-10-CM | POA: Diagnosis not present

## 2022-10-01 DIAGNOSIS — G8918 Other acute postprocedural pain: Secondary | ICD-10-CM | POA: Diagnosis not present

## 2022-10-01 DIAGNOSIS — Z7984 Long term (current) use of oral hypoglycemic drugs: Secondary | ICD-10-CM | POA: Diagnosis not present

## 2022-10-01 DIAGNOSIS — Z170 Estrogen receptor positive status [ER+]: Secondary | ICD-10-CM | POA: Diagnosis not present

## 2022-10-01 DIAGNOSIS — F32A Depression, unspecified: Secondary | ICD-10-CM | POA: Diagnosis not present

## 2022-10-01 DIAGNOSIS — K6389 Other specified diseases of intestine: Secondary | ICD-10-CM | POA: Diagnosis not present

## 2022-10-01 DIAGNOSIS — Z9049 Acquired absence of other specified parts of digestive tract: Secondary | ICD-10-CM | POA: Diagnosis not present

## 2022-10-01 DIAGNOSIS — D509 Iron deficiency anemia, unspecified: Secondary | ICD-10-CM | POA: Diagnosis not present

## 2022-10-01 DIAGNOSIS — Z87442 Personal history of urinary calculi: Secondary | ICD-10-CM | POA: Diagnosis not present

## 2022-10-01 DIAGNOSIS — R1013 Epigastric pain: Secondary | ICD-10-CM | POA: Diagnosis not present

## 2022-10-01 DIAGNOSIS — F4310 Post-traumatic stress disorder, unspecified: Secondary | ICD-10-CM | POA: Diagnosis not present

## 2022-10-01 DIAGNOSIS — N1832 Chronic kidney disease, stage 3b: Secondary | ICD-10-CM | POA: Diagnosis not present

## 2022-10-01 DIAGNOSIS — K561 Intussusception: Secondary | ICD-10-CM | POA: Diagnosis not present

## 2022-10-02 DIAGNOSIS — R1013 Epigastric pain: Secondary | ICD-10-CM | POA: Diagnosis not present

## 2022-10-03 DIAGNOSIS — R1013 Epigastric pain: Secondary | ICD-10-CM | POA: Diagnosis not present

## 2022-10-04 DIAGNOSIS — R1013 Epigastric pain: Secondary | ICD-10-CM | POA: Diagnosis not present

## 2022-10-05 DIAGNOSIS — R1013 Epigastric pain: Secondary | ICD-10-CM | POA: Diagnosis not present

## 2022-10-05 DIAGNOSIS — E039 Hypothyroidism, unspecified: Secondary | ICD-10-CM | POA: Diagnosis not present

## 2022-10-05 DIAGNOSIS — N183 Chronic kidney disease, stage 3 unspecified: Secondary | ICD-10-CM | POA: Diagnosis not present

## 2022-10-05 DIAGNOSIS — K561 Intussusception: Secondary | ICD-10-CM | POA: Diagnosis not present

## 2022-10-05 DIAGNOSIS — I129 Hypertensive chronic kidney disease with stage 1 through stage 4 chronic kidney disease, or unspecified chronic kidney disease: Secondary | ICD-10-CM | POA: Diagnosis not present

## 2022-10-05 DIAGNOSIS — E119 Type 2 diabetes mellitus without complications: Secondary | ICD-10-CM | POA: Diagnosis not present

## 2022-10-05 DIAGNOSIS — F32A Depression, unspecified: Secondary | ICD-10-CM | POA: Diagnosis not present

## 2022-10-05 DIAGNOSIS — E785 Hyperlipidemia, unspecified: Secondary | ICD-10-CM | POA: Diagnosis not present

## 2022-10-06 DIAGNOSIS — R1013 Epigastric pain: Secondary | ICD-10-CM | POA: Diagnosis not present

## 2022-10-21 DIAGNOSIS — C50412 Malignant neoplasm of upper-outer quadrant of left female breast: Secondary | ICD-10-CM | POA: Diagnosis not present

## 2022-10-21 DIAGNOSIS — C182 Malignant neoplasm of ascending colon: Secondary | ICD-10-CM | POA: Diagnosis not present

## 2022-10-21 DIAGNOSIS — C189 Malignant neoplasm of colon, unspecified: Secondary | ICD-10-CM | POA: Diagnosis not present

## 2022-10-21 DIAGNOSIS — D62 Acute posthemorrhagic anemia: Secondary | ICD-10-CM | POA: Diagnosis not present

## 2022-10-21 DIAGNOSIS — Z170 Estrogen receptor positive status [ER+]: Secondary | ICD-10-CM | POA: Diagnosis not present

## 2022-10-27 DIAGNOSIS — Z9049 Acquired absence of other specified parts of digestive tract: Secondary | ICD-10-CM | POA: Diagnosis not present

## 2022-10-27 DIAGNOSIS — Z4889 Encounter for other specified surgical aftercare: Secondary | ICD-10-CM | POA: Diagnosis not present

## 2022-10-27 DIAGNOSIS — Z9889 Other specified postprocedural states: Secondary | ICD-10-CM | POA: Diagnosis not present

## 2022-12-01 DIAGNOSIS — L500 Allergic urticaria: Secondary | ICD-10-CM | POA: Diagnosis not present

## 2022-12-03 DIAGNOSIS — K219 Gastro-esophageal reflux disease without esophagitis: Secondary | ICD-10-CM | POA: Diagnosis not present

## 2022-12-03 DIAGNOSIS — E785 Hyperlipidemia, unspecified: Secondary | ICD-10-CM | POA: Diagnosis not present

## 2022-12-03 DIAGNOSIS — E039 Hypothyroidism, unspecified: Secondary | ICD-10-CM | POA: Diagnosis not present

## 2022-12-03 DIAGNOSIS — I129 Hypertensive chronic kidney disease with stage 1 through stage 4 chronic kidney disease, or unspecified chronic kidney disease: Secondary | ICD-10-CM | POA: Diagnosis not present

## 2022-12-03 DIAGNOSIS — D509 Iron deficiency anemia, unspecified: Secondary | ICD-10-CM | POA: Diagnosis not present

## 2022-12-03 DIAGNOSIS — N1832 Chronic kidney disease, stage 3b: Secondary | ICD-10-CM | POA: Diagnosis not present

## 2022-12-03 DIAGNOSIS — Z23 Encounter for immunization: Secondary | ICD-10-CM | POA: Diagnosis not present

## 2022-12-03 DIAGNOSIS — G894 Chronic pain syndrome: Secondary | ICD-10-CM | POA: Diagnosis not present

## 2022-12-03 DIAGNOSIS — G2581 Restless legs syndrome: Secondary | ICD-10-CM | POA: Diagnosis not present

## 2022-12-03 DIAGNOSIS — I700 Atherosclerosis of aorta: Secondary | ICD-10-CM | POA: Diagnosis not present

## 2022-12-03 DIAGNOSIS — E1169 Type 2 diabetes mellitus with other specified complication: Secondary | ICD-10-CM | POA: Diagnosis not present

## 2022-12-03 DIAGNOSIS — F331 Major depressive disorder, recurrent, moderate: Secondary | ICD-10-CM | POA: Diagnosis not present

## 2022-12-03 DIAGNOSIS — E1122 Type 2 diabetes mellitus with diabetic chronic kidney disease: Secondary | ICD-10-CM | POA: Diagnosis not present

## 2022-12-11 DIAGNOSIS — E876 Hypokalemia: Secondary | ICD-10-CM | POA: Diagnosis not present

## 2023-01-11 DIAGNOSIS — N200 Calculus of kidney: Secondary | ICD-10-CM | POA: Diagnosis not present

## 2023-01-11 DIAGNOSIS — K5730 Diverticulosis of large intestine without perforation or abscess without bleeding: Secondary | ICD-10-CM | POA: Diagnosis not present

## 2023-01-11 DIAGNOSIS — Z90710 Acquired absence of both cervix and uterus: Secondary | ICD-10-CM | POA: Diagnosis not present

## 2023-01-11 DIAGNOSIS — I700 Atherosclerosis of aorta: Secondary | ICD-10-CM | POA: Diagnosis not present

## 2023-01-11 DIAGNOSIS — C189 Malignant neoplasm of colon, unspecified: Secondary | ICD-10-CM | POA: Diagnosis not present

## 2023-01-11 DIAGNOSIS — N281 Cyst of kidney, acquired: Secondary | ICD-10-CM | POA: Diagnosis not present

## 2023-01-11 DIAGNOSIS — M47814 Spondylosis without myelopathy or radiculopathy, thoracic region: Secondary | ICD-10-CM | POA: Diagnosis not present

## 2023-01-29 DIAGNOSIS — C182 Malignant neoplasm of ascending colon: Secondary | ICD-10-CM | POA: Diagnosis not present

## 2023-01-29 DIAGNOSIS — Z170 Estrogen receptor positive status [ER+]: Secondary | ICD-10-CM | POA: Diagnosis not present

## 2023-01-29 DIAGNOSIS — C189 Malignant neoplasm of colon, unspecified: Secondary | ICD-10-CM | POA: Diagnosis not present

## 2023-01-29 DIAGNOSIS — D62 Acute posthemorrhagic anemia: Secondary | ICD-10-CM | POA: Diagnosis not present

## 2023-01-29 DIAGNOSIS — C50412 Malignant neoplasm of upper-outer quadrant of left female breast: Secondary | ICD-10-CM | POA: Diagnosis not present

## 2023-02-15 DIAGNOSIS — L820 Inflamed seborrheic keratosis: Secondary | ICD-10-CM | POA: Diagnosis not present

## 2023-02-15 DIAGNOSIS — D239 Other benign neoplasm of skin, unspecified: Secondary | ICD-10-CM | POA: Diagnosis not present

## 2023-03-26 DIAGNOSIS — D72829 Elevated white blood cell count, unspecified: Secondary | ICD-10-CM | POA: Diagnosis not present

## 2023-03-31 DIAGNOSIS — H2512 Age-related nuclear cataract, left eye: Secondary | ICD-10-CM | POA: Diagnosis not present

## 2023-03-31 DIAGNOSIS — H2513 Age-related nuclear cataract, bilateral: Secondary | ICD-10-CM | POA: Diagnosis not present

## 2023-03-31 DIAGNOSIS — Z961 Presence of intraocular lens: Secondary | ICD-10-CM | POA: Diagnosis not present

## 2023-04-07 DIAGNOSIS — G2581 Restless legs syndrome: Secondary | ICD-10-CM | POA: Diagnosis not present

## 2023-04-07 DIAGNOSIS — R8290 Unspecified abnormal findings in urine: Secondary | ICD-10-CM | POA: Diagnosis not present

## 2023-04-07 DIAGNOSIS — E785 Hyperlipidemia, unspecified: Secondary | ICD-10-CM | POA: Diagnosis not present

## 2023-04-07 DIAGNOSIS — I129 Hypertensive chronic kidney disease with stage 1 through stage 4 chronic kidney disease, or unspecified chronic kidney disease: Secondary | ICD-10-CM | POA: Diagnosis not present

## 2023-04-07 DIAGNOSIS — E1169 Type 2 diabetes mellitus with other specified complication: Secondary | ICD-10-CM | POA: Diagnosis not present

## 2023-04-07 DIAGNOSIS — R110 Nausea: Secondary | ICD-10-CM | POA: Diagnosis not present

## 2023-04-07 DIAGNOSIS — K219 Gastro-esophageal reflux disease without esophagitis: Secondary | ICD-10-CM | POA: Diagnosis not present

## 2023-04-07 DIAGNOSIS — D72829 Elevated white blood cell count, unspecified: Secondary | ICD-10-CM | POA: Diagnosis not present

## 2023-04-14 DIAGNOSIS — H2511 Age-related nuclear cataract, right eye: Secondary | ICD-10-CM | POA: Diagnosis not present

## 2023-04-21 DIAGNOSIS — L820 Inflamed seborrheic keratosis: Secondary | ICD-10-CM | POA: Diagnosis not present

## 2023-04-26 DIAGNOSIS — C189 Malignant neoplasm of colon, unspecified: Secondary | ICD-10-CM | POA: Diagnosis not present

## 2023-04-29 DIAGNOSIS — Z170 Estrogen receptor positive status [ER+]: Secondary | ICD-10-CM | POA: Diagnosis not present

## 2023-04-29 DIAGNOSIS — C182 Malignant neoplasm of ascending colon: Secondary | ICD-10-CM | POA: Diagnosis not present

## 2023-04-29 DIAGNOSIS — C50412 Malignant neoplasm of upper-outer quadrant of left female breast: Secondary | ICD-10-CM | POA: Diagnosis not present

## 2023-04-29 DIAGNOSIS — D72828 Other elevated white blood cell count: Secondary | ICD-10-CM | POA: Diagnosis not present

## 2023-05-14 DIAGNOSIS — C50412 Malignant neoplasm of upper-outer quadrant of left female breast: Secondary | ICD-10-CM | POA: Diagnosis not present

## 2023-05-14 DIAGNOSIS — C189 Malignant neoplasm of colon, unspecified: Secondary | ICD-10-CM | POA: Diagnosis not present

## 2023-05-14 DIAGNOSIS — Z79899 Other long term (current) drug therapy: Secondary | ICD-10-CM | POA: Diagnosis not present

## 2023-05-14 DIAGNOSIS — I700 Atherosclerosis of aorta: Secondary | ICD-10-CM | POA: Diagnosis not present

## 2023-05-14 DIAGNOSIS — N200 Calculus of kidney: Secondary | ICD-10-CM | POA: Diagnosis not present

## 2023-05-14 DIAGNOSIS — C182 Malignant neoplasm of ascending colon: Secondary | ICD-10-CM | POA: Diagnosis not present

## 2023-05-14 DIAGNOSIS — N261 Atrophy of kidney (terminal): Secondary | ICD-10-CM | POA: Diagnosis not present

## 2023-05-14 DIAGNOSIS — K760 Fatty (change of) liver, not elsewhere classified: Secondary | ICD-10-CM | POA: Diagnosis not present

## 2023-05-14 DIAGNOSIS — Z170 Estrogen receptor positive status [ER+]: Secondary | ICD-10-CM | POA: Diagnosis not present

## 2023-06-16 DIAGNOSIS — M1712 Unilateral primary osteoarthritis, left knee: Secondary | ICD-10-CM | POA: Diagnosis not present

## 2023-06-30 DIAGNOSIS — F5101 Primary insomnia: Secondary | ICD-10-CM | POA: Diagnosis not present

## 2023-06-30 DIAGNOSIS — G2581 Restless legs syndrome: Secondary | ICD-10-CM | POA: Diagnosis not present

## 2023-06-30 DIAGNOSIS — E1169 Type 2 diabetes mellitus with other specified complication: Secondary | ICD-10-CM | POA: Diagnosis not present

## 2023-06-30 DIAGNOSIS — I129 Hypertensive chronic kidney disease with stage 1 through stage 4 chronic kidney disease, or unspecified chronic kidney disease: Secondary | ICD-10-CM | POA: Diagnosis not present

## 2023-06-30 DIAGNOSIS — I700 Atherosclerosis of aorta: Secondary | ICD-10-CM | POA: Diagnosis not present

## 2023-06-30 DIAGNOSIS — N1832 Chronic kidney disease, stage 3b: Secondary | ICD-10-CM | POA: Diagnosis not present

## 2023-06-30 DIAGNOSIS — G894 Chronic pain syndrome: Secondary | ICD-10-CM | POA: Diagnosis not present

## 2023-06-30 DIAGNOSIS — E039 Hypothyroidism, unspecified: Secondary | ICD-10-CM | POA: Diagnosis not present

## 2023-06-30 DIAGNOSIS — F331 Major depressive disorder, recurrent, moderate: Secondary | ICD-10-CM | POA: Diagnosis not present

## 2023-06-30 DIAGNOSIS — E785 Hyperlipidemia, unspecified: Secondary | ICD-10-CM | POA: Diagnosis not present

## 2023-07-08 ENCOUNTER — Inpatient Hospital Stay: Payer: PPO | Attending: Hematology and Oncology | Admitting: Hematology and Oncology

## 2023-07-08 NOTE — Progress Notes (Deleted)
 [facility]  Telephone:([phone]) [phone] [phone]) [phone]   NB: Patient did not show for he 03/15/2017 appt  ID: [name] DOB: July 23, 1940  MR#: [id number]  AOZ#:[id number]  Patient Care Team: Victorio Grave, MD as PCP - General (Family Medicine) Magrinat, Rozella Cornfield, MD (Inactive) as Consulting Physician (Oncology) Ronn Cohn, MD as Consulting Physician (Orthopedic Surgery) Tami Falcon, MD as Consulting Physician (Gastroenterology) Thais Fill, MD as Consulting Physician (Dermatology) Trudee Furth, MD (Inactive) as Consulting Physician (Urology) Johna Myers, MD as Consulting Physician (Radiation Oncology) Enid Harry, MD as Consulting Physician (General Surgery)   CHIEF COMPLAINT: estrogen receptor positive breast cancer  CURRENT TREATMENT: Observation  INTERVAL HISTORY:    COVID 19 VACCINATION STATUS: Had Moderna vaccine x2 then had Covid October 2021, did receive antibiotic infusion; had COVID again subsequently and again received an infusion   HISTORY OF CURRENT ILLNESS:  From the original intake note:  Marily Shows initially palpated a lump to her left breast while watching TV which she brought to medical attention and was evaluated 1 week following by her PCP, Dr. Camila Cecil. She then had an unilateral diagnostic left mammography with tomography and CAD and left breast ultrasonography at [facility] on 11/17/2016 showing a 1.6 cm mass in the upper left breast suspicous for malignancy. The left axilla was benign.  Accordingly on 11/20/2016, she proceeded to biopsy of the left breast area in question. The pathology from this procedure showed ([id number]): invasive mammary carcinoma. Prognostic indicators: ER: 100% positive; PR: 5% positive; Both with strong staining intensity. Proliferation marker Ki67: 15% ; HER-2: negative.  The patient's subsequent history is as detailed below.   PAST MEDICAL HISTORY: Past Medical History:  Diagnosis Date    Anxiety    Arthritis    knees   Cancer (HCC) 11/2016   Left breast cancer   Depression    Diabetes mellitus without complication (HCC)    Diverticulitis    Diverticulosis    bleeding   GERD (gastroesophageal reflux disease)    Hypertension    Hyperthyroidism    nodule on thyroid , Radioactive, now hypo   Hypothyroidism, iatrogenic    After RI now hypo on synthroid    Personal history of radiation therapy     PAST SURGICAL HISTORY: Past Surgical History:  Procedure Laterality Date   ABDOMINAL HYSTERECTOMY  1977   BLADDER SURGERY     bladder suspension   BREAST EXCISIONAL BIOPSY Right    BREAST LUMPECTOMY Left 2018   BREAST LUMPECTOMY WITH RADIOACTIVE SEED AND SENTINEL LYMPH NODE BIOPSY Left 12/11/2016   Procedure: BREAST LUMPECTOMY WITH RADIOACTIVE SEED AND SENTINEL LYMPH NODE BIOPSY;  Surgeon: Enid Harry, MD;  Location: [facility];  Service: General;  Laterality: Left;   CHOLECYSTECTOMY N/A 03/29/2013   Procedure: LAPAROSCOPIC CHOLECYSTECTOMY WITH INTRAOPERATIVE CHOLANGIOGRAM;  Surgeon: Quitman Bucy, MD;  Location[facility];  Service: General;  Laterality: N/A;   ESOPHAGOGASTRODUODENOSCOPY (EGD) WITH PROPOFOL  N/A 03/13/2022   Procedure: ESOPHAGOGASTRODUODENOSCOPY (EGD) WITH PROPOFOL ;  Surgeon: Alvis Jourdain, MD;  Location: [facility];  Service: Gastroenterology;  Laterality: N/A;   JOINT REPLACEMENT Right 2009   total knee replacement   ROTATOR CUFF REPAIR Left    TOTAL KNEE REVISION  12/17/2010   Procedure: TOTAL KNEE REVISION;  Surgeon: Henri Loft Aluisio;  Location: [facility];  Service: Orthopedics;  Laterality: Right;    FAMILY HISTORY: Family History  Problem Relation Age of Onset   Diverticulitis Mother    Alzheimer's disease Mother    Colon cancer  Maternal Grandmother    Heart attack Father    Breast cancer Daughter   Her father died at age 81 from heart failure. Her mother died at age 81 from Alzheimer's. She has 1 sister and no brothers.  Her maternal grandmother had colon cancer and she is unsure of what age she was diagnosed with colon cancer. Her father was diagnosed with skin cancer in his late 66's, and she notes that he didn't have melanoma. Her daughter had DCIS breast cancer diagnosed at 81. She hasn't had genetic testing completed as of yet. Her oldest daughter had genetic testing which came back normal.      GYNECOLOGIC HISTORY:  No LMP recorded. Patient has had a hysterectomy.  Menarche: 83 years old Age at first live birth: 83 years old GP: GxP3 LMP: Had a hysterectomy Contraceptive: OCP HRT: Yes, Estrogen and Progesterone combination for over 30 years.      SOCIAL HISTORY:  She is retired from being a Oncologist at [employer]. Her (second) husband Synetta Eves is a receiving clerk at [employer]. She has 3 children from her first marriage: Murlean Armour age 31 who is Interior and spatial designer of a Adult nurse in [place]. Josefina Nian is 8 in Sales in [place]. 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 She has a granddaughter that is 62 years old.  She has 2 great-grandchildren and twins on the way (as of December 2020)              ADVANCED DIRECTIVES: Her oldest daughter, Murlean Armour is the [employer] of [place] and can be reached at [phone].    HEALTH MAINTENANCE: Social History   Tobacco Use   Smoking status: Never   Smokeless tobacco: Never  Substance Use Topics   Alcohol use: Yes    Comment: rarely    Colonoscopy:  PAP:  Bone density: at [facility] on 12/10/2015 found a T score of -1.6  Current Outpatient Medications on File Prior to Visit  Medication Sig Dispense Refill   acetaminophen  (TYLENOL ) 325 MG tablet Take 1-2 tablets (325-650 mg total) by mouth every 4 (four) hours as needed for mild pain.     acetaminophen  (TYLENOL ) 500 MG tablet Take 500 mg by mouth every 6 (six) hours as needed for moderate pain.     amLODipine  (NORVASC ) 10 MG tablet Take 10 mg by mouth daily.      apixaban  (ELIQUIS ) 5 MG TABS tablet Take 5 mg by mouth 2 (two) times daily.     dapagliflozin propanediol (FARXIGA) 5 MG TABS tablet Take 5 mg by mouth daily.     diphenhydrAMINE  (BENADRYL ) 25 mg capsule Take 1 capsule (25 mg total) by mouth every 6 (six) hours as needed for itching. 30 capsule 0   escitalopram  (LEXAPRO ) 10 MG tablet Take 10 mg daily by mouth.     glimepiride  (AMARYL ) 2 MG tablet Take 2 mg by mouth daily with breakfast.     hydrochlorothiazide  (HYDRODIURIL ) 25 MG tablet Take 25 mg by mouth daily.     levothyroxine  (SYNTHROID ) 100 MCG tablet Take 100 mcg by mouth every morning.     metoprolol  succinate (TOPROL -XL) 50 MG 24 hr tablet Take 50 mg by mouth daily.     Probiotic Product (ALIGN) 4 MG CAPS Take 1 capsule by mouth daily.     promethazine  (PHENERGAN ) 12.5 MG tablet Take 12.5 mg by mouth every 6 (six) hours as needed for vomiting or nausea.     rosuvastatin  (CRESTOR ) 5  MG tablet Take 5 mg by mouth daily.     traMADol  (ULTRAM ) 50 MG tablet Take 50 mg by mouth every 8 (eight) hours as needed for moderate pain.     traZODone  (DESYREL ) 50 MG tablet Take 0.5-1 tablets (25-50 mg total) by mouth at bedtime as needed for sleep. (Patient taking differently: Take 50 mg by mouth at bedtime as needed for sleep.) 10 tablet 0   No current facility-administered medications on file prior to visit.   OBJECTIVE: White woman in no acute distress  There were no vitals filed for this visit.     There were no vitals filed for this visit.   There is no height or weight on file to calculate BMI. Physical Exam Constitutional:      Appearance: Normal appearance.   Cardiovascular:     Pulses: Normal pulses.     Heart sounds: Normal heart sounds.  Chest:      Comments: Left breast with some post op changes.   Musculoskeletal:        General: Swelling (ankle swelling) present.     Cervical back: Normal range of motion and neck supple.  Lymphadenopathy:     Cervical: No cervical  adenopathy.   Neurological:     Mental Status: She is alert.      CBC    Component Value Date/Time   WBC 9.2 03/16/2022 0351   RBC 3.56 (L) 03/16/2022 0351   HGB 8.1 (L) 03/16/2022 0351   HGB 12.8 11/13/2021 1317   HGB 13.1 12/02/2016 1213   HCT 27.8 (L) 03/16/2022 0351   HCT 39.4 12/02/2016 1213   PLT 422 (H) 03/16/2022 0351   PLT 254 11/13/2021 1317   PLT 250 12/02/2016 1213   MCV 78.1 (L) 03/16/2022 0351   MCV 86.3 12/02/2016 1213   MCH 22.8 (L) 03/16/2022 0351   MCHC 29.1 (L) 03/16/2022 0351   RDW 17.7 (H) 03/16/2022 0351   RDW 15.1 (H) 12/02/2016 1213   LYMPHSABS 2.8 03/12/2022 1701   LYMPHSABS 2.5 12/02/2016 1213   MONOABS 0.7 03/12/2022 1701   MONOABS 0.5 12/02/2016 1213   EOSABS 0.3 03/12/2022 1701   EOSABS 0.2 12/02/2016 1213   BASOSABS 0.1 03/12/2022 1701   BASOSABS 0.1 12/02/2016 1213     STUDIES: No results found.   ELIGIBLE FOR AVAILABLE RESEARCH PROTOCOL: no   ASSESSMENT: 83 y.o. [place] woman s/p left breast upper outer quadrant biopsy 11/20/2016 for a clinical T1c N0, stage IA invasive ductal carcinoma, grade 1, estrogen and progesterone receptor positive, HER-2 not amplified, with an Mib-1 of 15%  (1) s/p left lumpectomy with sentinel lymph node sampling 12/11/2016 for a pT1c pN0 invasive ductal carcinoma, grade 2, with negative margins  (2) oncotype score of 24 predicted a 10-year risk of recurrence outside the breast of 16% if the patient's only systemic therapy was tamoxifen for 5 years.  It also predicted no significant benefit from chemotherapy.  (3) adjuvant radiation 01/27/2017 - 02/23/2017  Site/dose:   The patient initially received a dose of 42.5 Gy in 17 fractions to the breast using whole-breast tangent fields. This was delivered using a 3-D conformal technique. The patient then received a boost to the seroma. This delivered an additional 7.5 Gy in 3 fractions using a 3 field photon technique due to the depth of the seroma. The total  dose was 50 Gy.   (4) anastrozole  started 03/16/2017  (a) bone density at [facility] 12/10/2015 found a T score of -1.6  (b)  repeat bone density 12/15/2017 shows a T score of -1.6 (unchanged).  (c) off anastrozole  January 2020 through May 2020 (patient did not refill prescription)             (D) completed 5 yrs of anastrozole .  PLAN:     *Total Encounter Time as defined by the Centers for Medicare and Medicaid Services includes, in addition to the face-to-face time of a patient visit (documented in the note above) non-face-to-face time: obtaining and reviewing outside history, ordering and reviewing medications, tests or procedures, care coordination (communications with other health care professionals or caregivers) and documentation in the medical record.

## 2023-07-12 DIAGNOSIS — L821 Other seborrheic keratosis: Secondary | ICD-10-CM | POA: Diagnosis not present

## 2023-07-12 DIAGNOSIS — D485 Neoplasm of uncertain behavior of skin: Secondary | ICD-10-CM | POA: Diagnosis not present

## 2023-07-12 DIAGNOSIS — L820 Inflamed seborrheic keratosis: Secondary | ICD-10-CM | POA: Diagnosis not present

## 2023-07-12 DIAGNOSIS — L738 Other specified follicular disorders: Secondary | ICD-10-CM | POA: Diagnosis not present

## 2023-07-12 DIAGNOSIS — B078 Other viral warts: Secondary | ICD-10-CM | POA: Diagnosis not present

## 2023-07-21 DIAGNOSIS — M1712 Unilateral primary osteoarthritis, left knee: Secondary | ICD-10-CM | POA: Diagnosis not present

## 2023-07-29 DIAGNOSIS — D72828 Other elevated white blood cell count: Secondary | ICD-10-CM | POA: Diagnosis not present

## 2023-07-29 DIAGNOSIS — C189 Malignant neoplasm of colon, unspecified: Secondary | ICD-10-CM | POA: Diagnosis not present

## 2023-07-29 DIAGNOSIS — Z170 Estrogen receptor positive status [ER+]: Secondary | ICD-10-CM | POA: Diagnosis not present

## 2023-07-29 DIAGNOSIS — C182 Malignant neoplasm of ascending colon: Secondary | ICD-10-CM | POA: Diagnosis not present

## 2023-07-29 DIAGNOSIS — C50412 Malignant neoplasm of upper-outer quadrant of left female breast: Secondary | ICD-10-CM | POA: Diagnosis not present

## 2023-07-29 DIAGNOSIS — D500 Iron deficiency anemia secondary to blood loss (chronic): Secondary | ICD-10-CM | POA: Diagnosis not present

## 2023-08-19 DIAGNOSIS — J352 Hypertrophy of adenoids: Secondary | ICD-10-CM | POA: Diagnosis not present

## 2023-08-19 DIAGNOSIS — R9402 Abnormal brain scan: Secondary | ICD-10-CM | POA: Diagnosis not present

## 2023-09-13 DIAGNOSIS — Z1231 Encounter for screening mammogram for malignant neoplasm of breast: Secondary | ICD-10-CM | POA: Diagnosis not present

## 2023-09-15 ENCOUNTER — Telehealth: Payer: Self-pay

## 2023-09-15 NOTE — Telephone Encounter (Signed)
 I will update the requesting office the pt seems to be following Dr. Sabina Custovic with [facility].

## 2023-09-15 NOTE — Telephone Encounter (Signed)
   Pre-operative Risk Assessment    Patient Name: [name]  DOB: [birth year] MRN: [id number]   Date of last office visit: 05/20/22 ANNALEE CASA, DO Date of next office visit: NONE   Request for Surgical Clearance    Procedure:  COLONOSCOPY  Date of Surgery:  Clearance 10/01/23                                Surgeon:  DR KRISTIE Surgeon's Group or Practice Name:  [facility], [place] Phone number:  [phone] Fax number:  [phone]   Type of Clearance Requested:   - Medical  - Pharmacy:  Hold Apixaban  (Eliquis )     Type of Anesthesia:  PROPOFOL    Additional requests/questions:    SignedLucie DELENA Ku   09/15/2023, 2:29 PM

## 2023-09-15 NOTE — Telephone Encounter (Signed)
 Patient is seen by Dr. Custovic with [facility].  Please notify surgeon to send clearance request to them.

## 2023-09-23 DIAGNOSIS — M1712 Unilateral primary osteoarthritis, left knee: Secondary | ICD-10-CM | POA: Diagnosis not present

## 2023-09-30 DIAGNOSIS — G894 Chronic pain syndrome: Secondary | ICD-10-CM | POA: Diagnosis not present

## 2023-09-30 DIAGNOSIS — E039 Hypothyroidism, unspecified: Secondary | ICD-10-CM | POA: Diagnosis not present

## 2023-09-30 DIAGNOSIS — E1169 Type 2 diabetes mellitus with other specified complication: Secondary | ICD-10-CM | POA: Diagnosis not present

## 2023-09-30 DIAGNOSIS — E785 Hyperlipidemia, unspecified: Secondary | ICD-10-CM | POA: Diagnosis not present

## 2023-09-30 DIAGNOSIS — Z23 Encounter for immunization: Secondary | ICD-10-CM | POA: Diagnosis not present

## 2023-09-30 DIAGNOSIS — M1712 Unilateral primary osteoarthritis, left knee: Secondary | ICD-10-CM | POA: Diagnosis not present

## 2023-09-30 DIAGNOSIS — G2581 Restless legs syndrome: Secondary | ICD-10-CM | POA: Diagnosis not present

## 2023-09-30 DIAGNOSIS — M8588 Other specified disorders of bone density and structure, other site: Secondary | ICD-10-CM | POA: Diagnosis not present

## 2023-09-30 DIAGNOSIS — I129 Hypertensive chronic kidney disease with stage 1 through stage 4 chronic kidney disease, or unspecified chronic kidney disease: Secondary | ICD-10-CM | POA: Diagnosis not present

## 2023-09-30 DIAGNOSIS — F331 Major depressive disorder, recurrent, moderate: Secondary | ICD-10-CM | POA: Diagnosis not present

## 2023-09-30 DIAGNOSIS — N1832 Chronic kidney disease, stage 3b: Secondary | ICD-10-CM | POA: Diagnosis not present

## 2023-09-30 DIAGNOSIS — I700 Atherosclerosis of aorta: Secondary | ICD-10-CM | POA: Diagnosis not present

## 2023-09-30 DIAGNOSIS — F5101 Primary insomnia: Secondary | ICD-10-CM | POA: Diagnosis not present

## 2023-10-07 DIAGNOSIS — M1712 Unilateral primary osteoarthritis, left knee: Secondary | ICD-10-CM | POA: Diagnosis not present

## 2023-10-11 DIAGNOSIS — M5442 Lumbago with sciatica, left side: Secondary | ICD-10-CM | POA: Diagnosis not present

## 2023-10-11 DIAGNOSIS — M5441 Lumbago with sciatica, right side: Secondary | ICD-10-CM | POA: Diagnosis not present

## 2023-10-13 DIAGNOSIS — M5416 Radiculopathy, lumbar region: Secondary | ICD-10-CM | POA: Diagnosis not present

## 2023-10-19 DIAGNOSIS — H43392 Other vitreous opacities, left eye: Secondary | ICD-10-CM | POA: Diagnosis not present

## 2023-10-19 DIAGNOSIS — H16142 Punctate keratitis, left eye: Secondary | ICD-10-CM | POA: Diagnosis not present

## 2023-10-19 DIAGNOSIS — H26492 Other secondary cataract, left eye: Secondary | ICD-10-CM | POA: Diagnosis not present

## 2023-10-19 DIAGNOSIS — E1169 Type 2 diabetes mellitus with other specified complication: Secondary | ICD-10-CM | POA: Diagnosis not present

## 2023-10-31 NOTE — Progress Notes (Deleted)
 Cardiology Office Note:    Date:  10/31/2023   ID:  [name], DOB 1940/10/31, MRN [id number]  PCP:  Teresa Channel, MD  Cardiologist:  None  Electrophysiologist:  None   Referring MD: Teresa Channel, MD   No chief complaint on file. ***  History of Present Illness:    [name] is a 83 y.o. female with a hx of hypertension, breast cancer, PE, T2DM, hypothyroidism who is referred by Dr. Teresa for evaluation of preoperative evaluation.  She was admitted with acute PE with cor pulmonale 12/2021.  Echocardiogram showed moderately reduced RV function, RVSP 46 mmHg, EF 70 to 75%.  Repeat echocardiogram 04/2022 there is no mention of RV systolic function (study was done at [facility] cardiovascular) but pulmonary pressures were reportedly normal.  She was admitted with GI bleed 02/2022  Past Medical History:  Diagnosis Date   Anxiety    Arthritis    knees   Cancer (HCC) 11/2016   Left breast cancer   Depression    Diabetes mellitus without complication (HCC)    Diverticulitis    Diverticulosis    bleeding   GERD (gastroesophageal reflux disease)    Hypertension    Hyperthyroidism    nodule on thyroid , Radioactive, now hypo   Hypothyroidism, iatrogenic    After RI now hypo on synthroid    Personal history of radiation therapy     Past Surgical History:  Procedure Laterality Date   ABDOMINAL HYSTERECTOMY  1977   BLADDER SURGERY     bladder suspension   BREAST EXCISIONAL BIOPSY Right    BREAST LUMPECTOMY Left 2018   BREAST LUMPECTOMY WITH RADIOACTIVE SEED AND SENTINEL LYMPH NODE BIOPSY Left 12/11/2016   Procedure: BREAST LUMPECTOMY WITH RADIOACTIVE SEED AND SENTINEL LYMPH NODE BIOPSY;  Surgeon: Ebbie Cough, MD;  Location: [facility];  Service: General;  Laterality: Left;   CHOLECYSTECTOMY N/A 03/29/2013   Procedure: LAPAROSCOPIC CHOLECYSTECTOMY WITH INTRAOPERATIVE CHOLANGIOGRAM;  Surgeon: Morene ONEIDA Olives, MD;  Location: [facility];  Service: General;   Laterality: N/A;   ESOPHAGOGASTRODUODENOSCOPY (EGD) WITH PROPOFOL  N/A 03/13/2022   Procedure: ESOPHAGOGASTRODUODENOSCOPY (EGD) WITH PROPOFOL ;  Surgeon: Rollin Dover, MD;  [facility];  Service: Gastroenterology;  Laterality: N/A;   JOINT REPLACEMENT Right 2009   total knee replacement   ROTATOR CUFF REPAIR Left    TOTAL KNEE REVISION  12/17/2010   Procedure: TOTAL KNEE REVISION;  Surgeon: Dempsey GAILS Aluisio;  Location: [facility];  Service: Orthopedics;  Laterality: Right;    Current Medications: No outpatient medications have been marked as taking for the 11/05/23 encounter (Appointment) with Kate Lonni LITTIE, MD.     Allergies:   Amitriptyline, Aspirin, Nsaids, Meperidine  hcl, Morphine and codeine , and Morphine sulfate   Social History   Socioeconomic History   Marital status: Married    Spouse name: Not on file   Number of children: 3   Years of education: Not on file   Highest education level: Not on file  Occupational History   Not on file  Tobacco Use   Smoking status: Never   Smokeless tobacco: Never  Vaping Use   Vaping status: Never Used  Substance and Sexual Activity   Alcohol use: Yes    Comment: rarely   Drug use: No   Sexual activity: Not Currently    Birth control/protection: Surgical    Comment: Hysterectomy  Other Topics Concern   Not on file  Social History Narrative   Not on file   Social Drivers  of Health   Financial Resource Strain: Not on file  Food Insecurity: Low Risk  (10/01/2022)   Received from [facility]   Hunger Vital Sign    Within the past 12 months, you worried that your food would run out before you got money to buy more: Never true    Within the past 12 months, the food you bought just didn't last and you didn't have money to get more. : Never true  Transportation Needs: No Transportation Needs (10/01/2022)   Received from [facility]    In the past 12 months, has lack of reliable transportation kept  you from medical appointments, meetings, work or from getting things needed for daily living? : No  Physical Activity: Not on file  Stress: Not on file  Social Connections: Not on file     Family History: The patient's ***family history includes Alzheimer's disease in her mother; Breast cancer in her daughter; Colon cancer in her maternal grandmother; Diverticulitis in her mother; Heart attack in her father.  ROS:   Please see the history of present illness.    *** All other systems reviewed and are negative.  EKGs/Labs/Other Studies Reviewed:    The following studies were reviewed today: ***  EKG:  EKG is *** ordered today.  The ekg ordered today demonstrates ***  Recent Labs: No results found for requested labs within last 365 days.  Recent Lipid Panel    Component Value Date/Time   CHOL 229 (H) 12/13/2013 1209   TRIG 241 (H) 12/13/2013 1209   HDL 51 12/13/2013 1209   CHOLHDL 4.5 12/13/2013 1209   VLDL 48 (H) 12/13/2013 1209   LDLCALC 130 (H) 12/13/2013 1209    Physical Exam:    VS:  There were no vitals taken for this visit.    Wt Readings from Last 3 Encounters:  07/07/22 163 lb 12.8 oz (74.3 kg)  05/20/22 170 lb 9.6 oz (77.4 kg)  03/13/22 174 lb 9.6 oz (79.2 kg)     GEN: *** Well nourished, well developed in no acute distress HEENT: Normal NECK: No JVD; No carotid bruits LYMPHATICS: No lymphadenopathy CARDIAC: ***RRR, no murmurs, rubs, gallops RESPIRATORY:  Clear to auscultation without rales, wheezing or rhonchi  ABDOMEN: Soft, non-tender, non-distended MUSCULOSKELETAL:  No edema; No deformity  SKIN: Warm and dry NEUROLOGIC:  Alert and oriented x 3 PSYCHIATRIC:  Normal affect   ASSESSMENT:    No diagnosis found. PLAN:    Preop evaluation:  PE: Admitted with acute PE with cor pulmonale 12/2021.  Echocardiogram showed moderately reduced RV function, RVSP 46 mmHg, EF 70 to 75%.  Repeat echocardiogram 04/2022 there is no mention of RV systolic function  (study was done at [facility] cardiovascular) but pulmonary pressures were reportedly normal.  She was admitted with GI bleed 02/2022 -Eliquis ***  Hypertension: On amlodipine  10 mg daily, HCTZ 25 mg daily, Toprol -XL 50 mg daily  Hyperlipidemia: On rosuvastatin  5 mg daily   T2DM: On glimepiride , Farxiga  RTC in***   Medication Adjustments/Labs and Tests Ordered: Current medicines are reviewed at length with the patient today.  Concerns regarding medicines are outlined above.  No orders of the defined types were placed in this encounter.  No orders of the defined types were placed in this encounter.   There are no Patient Instructions on file for this visit.   Signed, Lonni LITTIE Nanas, MD  10/31/2023 8:58 PM    [facility]

## 2023-11-01 DIAGNOSIS — R634 Abnormal weight loss: Secondary | ICD-10-CM | POA: Diagnosis not present

## 2023-11-01 DIAGNOSIS — R630 Anorexia: Secondary | ICD-10-CM | POA: Diagnosis not present

## 2023-11-01 DIAGNOSIS — E1169 Type 2 diabetes mellitus with other specified complication: Secondary | ICD-10-CM | POA: Diagnosis not present

## 2023-11-01 DIAGNOSIS — Z85038 Personal history of other malignant neoplasm of large intestine: Secondary | ICD-10-CM | POA: Diagnosis not present

## 2023-11-01 DIAGNOSIS — F331 Major depressive disorder, recurrent, moderate: Secondary | ICD-10-CM | POA: Diagnosis not present

## 2023-11-01 DIAGNOSIS — N1832 Chronic kidney disease, stage 3b: Secondary | ICD-10-CM | POA: Diagnosis not present

## 2023-11-01 DIAGNOSIS — R509 Fever, unspecified: Secondary | ICD-10-CM | POA: Diagnosis not present

## 2023-11-01 DIAGNOSIS — E039 Hypothyroidism, unspecified: Secondary | ICD-10-CM | POA: Diagnosis not present

## 2023-11-05 ENCOUNTER — Ambulatory Visit: Admitting: Cardiology

## 2023-11-10 DIAGNOSIS — R42 Dizziness and giddiness: Secondary | ICD-10-CM | POA: Diagnosis not present

## 2023-11-10 DIAGNOSIS — F331 Major depressive disorder, recurrent, moderate: Secondary | ICD-10-CM | POA: Diagnosis not present

## 2023-11-10 DIAGNOSIS — N289 Disorder of kidney and ureter, unspecified: Secondary | ICD-10-CM | POA: Diagnosis not present

## 2023-11-10 DIAGNOSIS — R0981 Nasal congestion: Secondary | ICD-10-CM | POA: Diagnosis not present

## 2023-11-10 DIAGNOSIS — E876 Hypokalemia: Secondary | ICD-10-CM | POA: Diagnosis not present

## 2023-11-16 DIAGNOSIS — D500 Iron deficiency anemia secondary to blood loss (chronic): Secondary | ICD-10-CM | POA: Diagnosis not present

## 2023-11-16 DIAGNOSIS — C182 Malignant neoplasm of ascending colon: Secondary | ICD-10-CM | POA: Diagnosis not present

## 2023-11-16 DIAGNOSIS — C50412 Malignant neoplasm of upper-outer quadrant of left female breast: Secondary | ICD-10-CM | POA: Diagnosis not present

## 2023-11-16 DIAGNOSIS — R9402 Abnormal brain scan: Secondary | ICD-10-CM | POA: Diagnosis not present

## 2023-11-16 DIAGNOSIS — Z170 Estrogen receptor positive status [ER+]: Secondary | ICD-10-CM | POA: Diagnosis not present

## 2023-11-25 DIAGNOSIS — E119 Type 2 diabetes mellitus without complications: Secondary | ICD-10-CM | POA: Diagnosis not present

## 2023-11-25 DIAGNOSIS — C182 Malignant neoplasm of ascending colon: Secondary | ICD-10-CM | POA: Diagnosis not present

## 2023-11-25 DIAGNOSIS — Z9049 Acquired absence of other specified parts of digestive tract: Secondary | ICD-10-CM | POA: Diagnosis not present

## 2023-12-02 DIAGNOSIS — F5101 Primary insomnia: Secondary | ICD-10-CM | POA: Diagnosis not present

## 2023-12-02 DIAGNOSIS — I129 Hypertensive chronic kidney disease with stage 1 through stage 4 chronic kidney disease, or unspecified chronic kidney disease: Secondary | ICD-10-CM | POA: Diagnosis not present

## 2023-12-02 DIAGNOSIS — E039 Hypothyroidism, unspecified: Secondary | ICD-10-CM | POA: Diagnosis not present

## 2023-12-02 DIAGNOSIS — F331 Major depressive disorder, recurrent, moderate: Secondary | ICD-10-CM | POA: Diagnosis not present

## 2023-12-02 DIAGNOSIS — E785 Hyperlipidemia, unspecified: Secondary | ICD-10-CM | POA: Diagnosis not present

## 2023-12-02 DIAGNOSIS — E1169 Type 2 diabetes mellitus with other specified complication: Secondary | ICD-10-CM | POA: Diagnosis not present

## 2023-12-02 DIAGNOSIS — N1832 Chronic kidney disease, stage 3b: Secondary | ICD-10-CM | POA: Diagnosis not present

## 2023-12-10 DIAGNOSIS — N390 Urinary tract infection, site not specified: Secondary | ICD-10-CM | POA: Diagnosis not present

## 2023-12-10 DIAGNOSIS — I10 Essential (primary) hypertension: Secondary | ICD-10-CM | POA: Diagnosis not present

## 2023-12-10 DIAGNOSIS — E1169 Type 2 diabetes mellitus with other specified complication: Secondary | ICD-10-CM | POA: Diagnosis not present

## 2023-12-30 DIAGNOSIS — N1832 Chronic kidney disease, stage 3b: Secondary | ICD-10-CM | POA: Diagnosis not present

## 2023-12-30 DIAGNOSIS — I129 Hypertensive chronic kidney disease with stage 1 through stage 4 chronic kidney disease, or unspecified chronic kidney disease: Secondary | ICD-10-CM | POA: Diagnosis not present

## 2023-12-30 DIAGNOSIS — E1169 Type 2 diabetes mellitus with other specified complication: Secondary | ICD-10-CM | POA: Diagnosis not present

## 2023-12-30 DIAGNOSIS — G894 Chronic pain syndrome: Secondary | ICD-10-CM | POA: Diagnosis not present

## 2023-12-30 DIAGNOSIS — E785 Hyperlipidemia, unspecified: Secondary | ICD-10-CM | POA: Diagnosis not present

## 2023-12-30 DIAGNOSIS — F5101 Primary insomnia: Secondary | ICD-10-CM | POA: Diagnosis not present

## 2023-12-30 DIAGNOSIS — E039 Hypothyroidism, unspecified: Secondary | ICD-10-CM | POA: Diagnosis not present

## 2023-12-30 DIAGNOSIS — G2581 Restless legs syndrome: Secondary | ICD-10-CM | POA: Diagnosis not present

## 2023-12-30 DIAGNOSIS — R197 Diarrhea, unspecified: Secondary | ICD-10-CM | POA: Diagnosis not present

## 2023-12-30 DIAGNOSIS — F331 Major depressive disorder, recurrent, moderate: Secondary | ICD-10-CM | POA: Diagnosis not present

## 2024-01-07 DIAGNOSIS — M1712 Unilateral primary osteoarthritis, left knee: Secondary | ICD-10-CM | POA: Diagnosis not present
